# Patient Record
Sex: Female | Born: 1940
Health system: Southern US, Community
[De-identification: ages and names within clinical notes are randomized; demographics above are authoritative.]

## PROBLEM LIST (undated history)

## (undated) DIAGNOSIS — I340 Nonrheumatic mitral (valve) insufficiency: Secondary | ICD-10-CM

## (undated) DIAGNOSIS — M1990 Unspecified osteoarthritis, unspecified site: Secondary | ICD-10-CM

## (undated) DIAGNOSIS — Z950 Presence of cardiac pacemaker: Secondary | ICD-10-CM

## (undated) DIAGNOSIS — H269 Unspecified cataract: Secondary | ICD-10-CM

## (undated) DIAGNOSIS — I447 Left bundle-branch block, unspecified: Secondary | ICD-10-CM

## (undated) DIAGNOSIS — B1920 Unspecified viral hepatitis C without hepatic coma: Secondary | ICD-10-CM

## (undated) DIAGNOSIS — I10 Essential (primary) hypertension: Secondary | ICD-10-CM

## (undated) DIAGNOSIS — F32A Depression, unspecified: Secondary | ICD-10-CM

## (undated) DIAGNOSIS — M549 Dorsalgia, unspecified: Secondary | ICD-10-CM

## (undated) DIAGNOSIS — G8929 Other chronic pain: Secondary | ICD-10-CM

## (undated) DIAGNOSIS — I5040 Unspecified combined systolic (congestive) and diastolic (congestive) heart failure: Secondary | ICD-10-CM

## (undated) DIAGNOSIS — I2510 Atherosclerotic heart disease of native coronary artery without angina pectoris: Secondary | ICD-10-CM

## (undated) DIAGNOSIS — Z955 Presence of coronary angioplasty implant and graft: Secondary | ICD-10-CM

## (undated) DIAGNOSIS — F419 Anxiety disorder, unspecified: Secondary | ICD-10-CM

## (undated) DIAGNOSIS — K219 Gastro-esophageal reflux disease without esophagitis: Secondary | ICD-10-CM

## (undated) DIAGNOSIS — N390 Urinary tract infection, site not specified: Secondary | ICD-10-CM

## (undated) DIAGNOSIS — Z95810 Presence of automatic (implantable) cardiac defibrillator: Secondary | ICD-10-CM

## (undated) DIAGNOSIS — T7840XA Allergy, unspecified, initial encounter: Secondary | ICD-10-CM

## (undated) DIAGNOSIS — G709 Myoneural disorder, unspecified: Secondary | ICD-10-CM

## (undated) DIAGNOSIS — R011 Cardiac murmur, unspecified: Secondary | ICD-10-CM

## (undated) DIAGNOSIS — I5022 Chronic systolic (congestive) heart failure: Secondary | ICD-10-CM

## (undated) DIAGNOSIS — I255 Ischemic cardiomyopathy: Secondary | ICD-10-CM

## (undated) DIAGNOSIS — M810 Age-related osteoporosis without current pathological fracture: Secondary | ICD-10-CM

## (undated) DIAGNOSIS — J449 Chronic obstructive pulmonary disease, unspecified: Secondary | ICD-10-CM

## (undated) DIAGNOSIS — R5382 Chronic fatigue, unspecified: Secondary | ICD-10-CM

## (undated) DIAGNOSIS — I272 Pulmonary hypertension, unspecified: Secondary | ICD-10-CM

## (undated) DIAGNOSIS — I700 Atherosclerosis of aorta: Secondary | ICD-10-CM

## (undated) DIAGNOSIS — I351 Nonrheumatic aortic (valve) insufficiency: Secondary | ICD-10-CM

## (undated) HISTORY — DX: Chronic fatigue, unspecified: R53.82

## (undated) HISTORY — DX: Atherosclerotic heart disease of native coronary artery without angina pectoris: I25.10

## (undated) HISTORY — DX: Allergy, unspecified, initial encounter: T78.40XA

## (undated) HISTORY — DX: Urinary tract infection, site not specified: N39.0

## (undated) HISTORY — DX: Chronic systolic (congestive) heart failure: I50.22

## (undated) HISTORY — DX: Age-related osteoporosis without current pathological fracture: M81.0

## (undated) HISTORY — DX: Unspecified osteoarthritis, unspecified site: M19.90

## (undated) HISTORY — DX: Left bundle-branch block, unspecified: I44.7

## (undated) HISTORY — DX: Other chronic pain: M54.9

## (undated) HISTORY — DX: Ischemic cardiomyopathy: I25.5

## (undated) HISTORY — DX: Cardiac murmur, unspecified: R01.1

## (undated) HISTORY — DX: Gastro-esophageal reflux disease without esophagitis: K21.9

## (undated) HISTORY — DX: Myoneural disorder, unspecified: G70.9

## (undated) HISTORY — DX: Unspecified cataract: H26.9

## (undated) HISTORY — DX: Depression, unspecified: F32.A

## (undated) HISTORY — DX: Other chronic pain: G89.29

## (undated) HISTORY — DX: Anxiety disorder, unspecified: F41.9

## (undated) HISTORY — DX: Unspecified viral hepatitis C without hepatic coma: B19.20

## (undated) HISTORY — DX: Nonrheumatic mitral (valve) insufficiency: I34.0

---

## 1997-07-15 ENCOUNTER — Ambulatory Visit (HOSPITAL_BASED_OUTPATIENT_CLINIC_OR_DEPARTMENT_OTHER): Admission: RE | Admit: 1997-07-15 | Discharge: 1997-07-15 | Payer: Self-pay | Admitting: *Deleted

## 2001-08-03 LAB — HM COLONOSCOPY: HM COLON: NORMAL

## 2001-12-08 ENCOUNTER — Ambulatory Visit (HOSPITAL_COMMUNITY): Admission: RE | Admit: 2001-12-08 | Discharge: 2001-12-08 | Payer: Self-pay | Admitting: Gastroenterology

## 2004-10-11 ENCOUNTER — Emergency Department: Payer: Self-pay | Admitting: Emergency Medicine

## 2012-02-16 ENCOUNTER — Ambulatory Visit: Payer: Self-pay | Admitting: Obstetrics and Gynecology

## 2012-02-18 ENCOUNTER — Ambulatory Visit: Payer: Self-pay

## 2012-05-25 ENCOUNTER — Encounter: Payer: Self-pay | Admitting: Physical Medicine and Rehabilitation

## 2012-06-02 ENCOUNTER — Encounter: Payer: Self-pay | Admitting: Physical Medicine and Rehabilitation

## 2012-07-02 ENCOUNTER — Encounter: Payer: Self-pay | Admitting: Physical Medicine and Rehabilitation

## 2012-09-01 ENCOUNTER — Other Ambulatory Visit (HOSPITAL_COMMUNITY): Payer: Self-pay | Admitting: Physical Medicine and Rehabilitation

## 2012-09-01 DIAGNOSIS — IMO0002 Reserved for concepts with insufficient information to code with codable children: Secondary | ICD-10-CM

## 2012-09-01 DIAGNOSIS — M549 Dorsalgia, unspecified: Secondary | ICD-10-CM

## 2012-09-01 HISTORY — PX: BACK SURGERY: SHX140

## 2012-09-02 ENCOUNTER — Ambulatory Visit (HOSPITAL_COMMUNITY)
Admission: RE | Admit: 2012-09-02 | Discharge: 2012-09-02 | Disposition: A | Discharge status: Home / Self Care | Payer: Medicare PPO | Source: Ambulatory Visit | Attending: Physical Medicine and Rehabilitation | Admitting: Physical Medicine and Rehabilitation

## 2012-09-02 DIAGNOSIS — IMO0002 Reserved for concepts with insufficient information to code with codable children: Secondary | ICD-10-CM

## 2012-09-02 DIAGNOSIS — M549 Dorsalgia, unspecified: Secondary | ICD-10-CM

## 2012-09-07 ENCOUNTER — Other Ambulatory Visit: Payer: Self-pay | Admitting: Radiology

## 2012-09-07 ENCOUNTER — Other Ambulatory Visit (HOSPITAL_COMMUNITY): Payer: Self-pay | Admitting: Interventional Radiology

## 2012-09-07 DIAGNOSIS — M549 Dorsalgia, unspecified: Secondary | ICD-10-CM

## 2012-09-07 DIAGNOSIS — IMO0002 Reserved for concepts with insufficient information to code with codable children: Secondary | ICD-10-CM

## 2012-09-08 ENCOUNTER — Ambulatory Visit (HOSPITAL_COMMUNITY)
Admission: RE | Admit: 2012-09-08 | Discharge: 2012-09-08 | Disposition: A | Discharge status: Home / Self Care | Payer: Medicare PPO | Source: Ambulatory Visit | Attending: Interventional Radiology | Admitting: Interventional Radiology

## 2012-09-08 ENCOUNTER — Encounter (HOSPITAL_COMMUNITY): Payer: Self-pay

## 2012-09-08 ENCOUNTER — Other Ambulatory Visit (HOSPITAL_COMMUNITY): Payer: Self-pay | Admitting: Interventional Radiology

## 2012-09-08 DIAGNOSIS — Z91018 Allergy to other foods: Secondary | ICD-10-CM | POA: Insufficient documentation

## 2012-09-08 DIAGNOSIS — M549 Dorsalgia, unspecified: Secondary | ICD-10-CM

## 2012-09-08 DIAGNOSIS — M810 Age-related osteoporosis without current pathological fracture: Secondary | ICD-10-CM | POA: Insufficient documentation

## 2012-09-08 DIAGNOSIS — IMO0002 Reserved for concepts with insufficient information to code with codable children: Secondary | ICD-10-CM

## 2012-09-08 DIAGNOSIS — M8448XA Pathological fracture, other site, initial encounter for fracture: Secondary | ICD-10-CM | POA: Insufficient documentation

## 2012-09-08 LAB — APTT: aPTT: 27 seconds (ref 24–37)

## 2012-09-08 LAB — CBC
MCH: 31.1 pg (ref 26.0–34.0)
MCHC: 34 g/dL (ref 30.0–36.0)
MCV: 91.4 fL (ref 78.0–100.0)
Platelets: 215 10*3/uL (ref 150–400)
RDW: 13.1 % (ref 11.5–15.5)

## 2012-09-08 LAB — BASIC METABOLIC PANEL
CO2: 29 mEq/L (ref 19–32)
Calcium: 10.1 mg/dL (ref 8.4–10.5)
Creatinine, Ser: 0.59 mg/dL (ref 0.50–1.10)
Glucose, Bld: 107 mg/dL — ABNORMAL HIGH (ref 70–99)

## 2012-09-08 MED ORDER — SODIUM CHLORIDE 0.9 % IV SOLN: Freq: Once | INTRAVENOUS | Status: DC

## 2012-09-08 MED ORDER — HYDROCODONE-ACETAMINOPHEN 5-325 MG PO TABS
1.0000 | ORAL_TABLET | ORAL | Status: DC | PRN
  Administered 2012-09-08: 2 via ORAL

## 2012-09-08 MED ORDER — FENTANYL CITRATE 0.05 MG/ML IJ SOLN
INTRAMUSCULAR | Status: AC
  Filled 2012-09-08: qty 4

## 2012-09-08 MED ORDER — HYDROCODONE-ACETAMINOPHEN 5-325 MG PO TABS
ORAL_TABLET | ORAL | Status: AC
  Filled 2012-09-08: qty 2

## 2012-09-08 MED ORDER — GELATIN ABSORBABLE 12-7 MM EX MISC
CUTANEOUS | Status: AC
  Filled 2012-09-08: qty 1

## 2012-09-08 MED ORDER — TOBRAMYCIN SULFATE 1.2 G IJ SOLR
INTRAMUSCULAR | Status: AC
  Filled 2012-09-08: qty 1.2

## 2012-09-08 MED ORDER — SODIUM CHLORIDE 0.9 % IV SOLN: INTRAVENOUS | Status: AC

## 2012-09-08 MED ORDER — FENTANYL CITRATE 0.05 MG/ML IJ SOLN
INTRAMUSCULAR | Status: DC | PRN
  Administered 2012-09-08 (×4): 25 ug via INTRAVENOUS

## 2012-09-08 MED ORDER — HYDROMORPHONE HCL PF 1 MG/ML IJ SOLN
INTRAMUSCULAR | Status: AC
  Filled 2012-09-08: qty 3

## 2012-09-08 MED ORDER — MIDAZOLAM HCL 2 MG/2ML IJ SOLN
INTRAMUSCULAR | Status: AC
  Filled 2012-09-08: qty 6

## 2012-09-08 MED ORDER — IOHEXOL 300 MG/ML  SOLN
50.0000 mL | Freq: Once | INTRAMUSCULAR | Status: AC | PRN
  Administered 2012-09-08: 3 mL via INTRAVENOUS

## 2012-09-08 MED ORDER — MIDAZOLAM HCL 2 MG/2ML IJ SOLN
INTRAMUSCULAR | Status: DC | PRN
  Administered 2012-09-08 (×4): 1 mg via INTRAVENOUS

## 2012-09-08 MED ORDER — CEFAZOLIN SODIUM-DEXTROSE 2-3 GM-% IV SOLR
2.0000 g | Freq: Once | INTRAVENOUS | Status: AC
  Administered 2012-09-08: 2 g via INTRAVENOUS
  Filled 2012-09-08: qty 50

## 2012-09-08 NOTE — H&P (Signed)
[name] is an 72 y.o. female.   Chief Complaint: back pain since 01/2012. While taking shower- noted new pain- xrays showed T10 and L1 fx Pt has tried conservative management- no relief Consulted with Dr Estanislado Pandy Now scheduled for T1o kyphoplasty and L1 vertebroplasty  HPI: osteoporosis; back pain  History reviewed. No pertinent past medical history.  History reviewed. No pertinent past surgical history.  No family history on file. Social History:  has no tobacco, alcohol, and drug history on file.  Allergies:  Allergies  Allergen Reactions  . Sugar-Protein-Starch Nausea And Vomiting     (Not in a hospital admission)  Results for orders placed during the hospital encounter of 09/08/12 (from the past 48 hour(s))  APTT     Status: None   Collection Time    09/08/12  8:01 AM      Result Value Range   aPTT 27  24 - 37 seconds  CBC     Status: None   Collection Time    09/08/12  8:01 AM      Result Value Range   WBC 6.5  4.0 - 10.5 K/uL   RBC 4.63  3.87 - 5.11 MIL/uL   Hemoglobin 14.4  12.0 - 15.0 g/dL   HCT 42.3  36.0 - 46.0 %   MCV 91.4  78.0 - 100.0 fL   MCH 31.1  26.0 - 34.0 pg   MCHC 34.0  30.0 - 36.0 g/dL   RDW 13.1  11.5 - 15.5 %   Platelets 215  150 - 400 K/uL  PROTIME-INR     Status: None   Collection Time    09/08/12  8:01 AM      Result Value Range   Prothrombin Time 12.5  11.6 - 15.2 seconds   INR 0.95  0.00 - 1.49   No results found.  Review of Systems  Constitutional: Negative for fever.  Respiratory: Positive for shortness of breath.   Cardiovascular: Negative for chest pain.  Gastrointestinal: Negative for vomiting, abdominal pain and diarrhea.  Musculoskeletal: Positive for back pain.  Neurological: Positive for weakness. Negative for headaches.    Blood pressure 174/82, pulse 95, temperature 97.7 F (36.5 C), temperature source Oral, resp. rate 18, height 5\' 3"  (1.6 m), weight 111 lb (50.349 kg), SpO2 100.00%. Physical Exam   Constitutional: She is oriented to person, place, and time.  Cardiovascular: Normal rate, regular rhythm and normal heart sounds.   No murmur heard. Respiratory: Effort normal and breath sounds normal. She has no wheezes.  GI: Soft. Bowel sounds are normal. There is no tenderness.  Musculoskeletal: Normal range of motion.  Back pain  Neurological: She is alert and oriented to person, place, and time.  Skin: Skin is warm.  Psychiatric: She has a normal mood and affect. Her behavior is normal. Judgment and thought content normal.     Assessment/Plan Back pain T10; L1 fx Scheduled for KP/VP Pt aware of procedure benefits and risks and agreeable to proceed Consent signed and in chart  Alessia Gonsalez A 09/08/2012, 9:00 AM

## 2012-09-08 NOTE — Procedures (Signed)
S/P T10 VP and L1 VP

## 2012-09-09 ENCOUNTER — Telehealth (HOSPITAL_COMMUNITY): Payer: Self-pay | Admitting: *Deleted

## 2012-09-09 NOTE — Telephone Encounter (Signed)
Post procedure follow up call.  Spoke with pt.  Says she is doing well, no problems.  Denies questions or concerns at this time.  Says care was super, everyone did a great job and she was very impressed

## 2012-09-10 ENCOUNTER — Other Ambulatory Visit (HOSPITAL_COMMUNITY): Payer: Self-pay | Admitting: Interventional Radiology

## 2012-09-10 DIAGNOSIS — IMO0002 Reserved for concepts with insufficient information to code with codable children: Secondary | ICD-10-CM

## 2012-09-10 DIAGNOSIS — M549 Dorsalgia, unspecified: Secondary | ICD-10-CM

## 2012-09-22 ENCOUNTER — Ambulatory Visit (HOSPITAL_COMMUNITY)
Admission: RE | Admit: 2012-09-22 | Discharge: 2012-09-22 | Disposition: A | Discharge status: Home / Self Care | Payer: Medicare PPO | Source: Ambulatory Visit | Attending: Interventional Radiology | Admitting: Interventional Radiology

## 2012-09-22 DIAGNOSIS — M549 Dorsalgia, unspecified: Secondary | ICD-10-CM

## 2012-09-22 DIAGNOSIS — IMO0002 Reserved for concepts with insufficient information to code with codable children: Secondary | ICD-10-CM

## 2012-11-19 ENCOUNTER — Encounter: Payer: Self-pay | Admitting: Adult Health

## 2012-11-19 ENCOUNTER — Ambulatory Visit (INDEPENDENT_AMBULATORY_CARE_PROVIDER_SITE_OTHER): Payer: Medicare PPO | Admitting: Adult Health

## 2012-11-19 VITALS — BP 164/76 | HR 85 | Temp 97.9°F | Resp 12 | Ht 61.5 in | Wt 109.5 lb

## 2012-11-19 DIAGNOSIS — Z0000 Encounter for general adult medical examination without abnormal findings: Secondary | ICD-10-CM

## 2012-11-19 DIAGNOSIS — M810 Age-related osteoporosis without current pathological fracture: Secondary | ICD-10-CM

## 2012-11-19 DIAGNOSIS — I10 Essential (primary) hypertension: Secondary | ICD-10-CM | POA: Insufficient documentation

## 2012-11-19 NOTE — Assessment & Plan Note (Signed)
Normal exam except with her musculoskeletal system. Difficulty moving from sitting to standing position. Uses cane for stability. Significant back pain 2/2 arthritis and osteoporosis which she has not tolerated any bisphosphonate. She is apprehensive to trying the yearly IV infusion. Concerns about osteoporosis progressing and decreasing quality of life. Recently had labs drawn with one of the specialist but she is uncertain as to which were done. I will request records.

## 2012-11-19 NOTE — Assessment & Plan Note (Signed)
Patient reports that her condition is worsening. She has not tolerated fosamax or boniva. Does not want to consider IV yearly infusion. Ambulating with cane. Encourage her to speak with her orthopedic. She is going to schedule appt with them. I will request medical records. Refer if needed.

## 2012-11-19 NOTE — Assessment & Plan Note (Signed)
Currently on lisinopril. She reports that her blood pressure does well until she has to come to the provider's office. She was nervous about coming in. Feels this is why b/p elevated during visit. She checks this routinely in the evening and feels well controlled. We will continue to follow.

## 2012-11-19 NOTE — Progress Notes (Signed)
Subjective:    Patient ID: [name], female    DOB: 1940-06-13, 72 y.o.   MRN: [id number]  HPI  Patient is a pleasant 72 y/o female who presents to clinic to establish care.  She has ongoing problems related to her back. She also has a hx of osteoporosis. She reports she did not tolerate fosamax or boniva. She is concerned about the side effects of these medications. She reports that she looks up information on the internet related to these medications and has not found very reassuring information. She is walking with a cane. Reports that last bone scan was last year. She believes that her osteoporosis is worse but she is unable to give details. She has been approached about a yearly IV infusion but she is concerned about side effects.  I will request medical records.     Past Medical History  Diagnosis Date  . UTI (lower urinary tract infection)   . Arthritis   . Osteoporosis      Past Surgical History  Procedure Laterality Date  . Back surgery  07/14    T10, L1     Family History  Problem Relation Age of Onset  . Arthritis Mother   . Heart disease Father   . Diabetes Father      History   Social History  . Marital Status: Married    Spouse Name: Emma-Jane Velazques    Number of Children: 0  . Years of Education: 14   Occupational History  . 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 Smoking status: Former Smoker -- 1.00 packs/day for 10 years  . Smokeless tobacco: Never Used  . Alcohol Use: No  . Drug Use: No  . Sexual Activity: Yes    Birth Control/ Protection: Post-menopausal   Other Topics Concern  . Not on file   Social History Narrative   Pt is 72 yo female, married to husband of 21 years.  Pt has no children.  Pt lives with husband and teacup poodle, [name]. Pt states she hurt her back last year and has not been able to participate in much physical activity since. Pt enjoys traveling with her husband in motor home, doing crossword  puzzles with husband, and playing cards with friends.  Pt was raised in [place] and has lived in [place] entire adult life.  Pt worked in [employer] in [place] until [place] when they closed.        Review of Systems  Constitutional: Negative.   HENT: Negative.   Eyes: Negative.   Respiratory: Negative.   Cardiovascular: Negative.   Gastrointestinal: Positive for constipation.       Senna to help move her bowels  Endocrine: Negative.   Genitourinary: Negative for dysuria, frequency, hematuria and pelvic pain.       AZO for bladder control - uses for leakage  Musculoskeletal: Positive for back pain and gait problem.  Skin: Negative.   Allergic/Immunologic: Negative.   Neurological: Negative.   Hematological: Negative.   Psychiatric/Behavioral: Negative.        Objective:   Physical Exam  Constitutional: She is oriented to person, place, and time.  72 y/o pleasant female in NAD  HENT:  Head: Normocephalic and atraumatic.  Right Ear: External ear normal.  Left Ear: External ear normal.  Mouth/Throat: Oropharynx is clear and moist.  Eyes: Conjunctivae and EOM are normal. Pupils are equal, round, and reactive to light.  Neck:  Normal range of motion. Neck supple.  Cardiovascular: Normal rate, regular rhythm, normal heart sounds and intact distal pulses.  Exam reveals no gallop and no friction rub.   No murmur heard. Pulmonary/Chest: Effort normal and breath sounds normal. No respiratory distress. She has no wheezes. She has no rales.  Abdominal: Soft. Bowel sounds are normal. She exhibits no distension and no mass. There is no tenderness. There is no rebound and no guarding.  Musculoskeletal: She exhibits tenderness. She exhibits no edema.  Walks with cane. Decreased ability to get up and go.  Lymphadenopathy:    She has no cervical adenopathy.  Neurological: She is alert and oriented to person, place, and time.  Skin: Skin is warm and dry.  Psychiatric: She has a  normal mood and affect. Her behavior is normal. Judgment and thought content normal.    BP 164/76  Pulse 85  Temp(Src) 97.9 F (36.6 C) (Oral)  Resp 12  Ht 5' 1.5" (1.562 m)  Wt 109 lb 8 oz (49.669 kg)  BMI 20.36 kg/m2  SpO2 97%     Assessment & Plan:

## 2013-03-09 ENCOUNTER — Telehealth: Payer: Self-pay | Admitting: Adult Health

## 2013-03-09 NOTE — Telephone Encounter (Signed)
The patient wants to know if she needs a booster with her pneumonia vaccine.

## 2013-03-10 NOTE — Telephone Encounter (Signed)
Discussed with patient, received first Pneumovax after age 72, does not need second Pneumovax at this time. Also, discussed Medicare not covering Prevnar at this point for those who received Pneumovax after age 55. Can discuss with Raquel at her next visit.

## 2013-04-03 ENCOUNTER — Emergency Department: Payer: Self-pay | Admitting: Emergency Medicine

## 2013-04-03 LAB — URINALYSIS, COMPLETE
BLOOD: NEGATIVE
Bacteria: NONE SEEN
Bilirubin,UR: NEGATIVE
Glucose,UR: NEGATIVE mg/dL (ref 0–75)
Ketone: NEGATIVE
LEUKOCYTE ESTERASE: NEGATIVE
Nitrite: NEGATIVE
PH: 7 (ref 4.5–8.0)
Protein: NEGATIVE
RBC,UR: 2 /HPF (ref 0–5)
SPECIFIC GRAVITY: 1.018 (ref 1.003–1.030)
Squamous Epithelial: <1
WBC UR: 1 /HPF (ref 0–5)

## 2013-04-03 LAB — CBC
HCT: 45.3 % (ref 35.0–47.0)
HGB: 14.6 g/dL (ref 12.0–16.0)
MCH: 30.5 pg (ref 26.0–34.0)
MCHC: 32.3 g/dL (ref 32.0–36.0)
MCV: 94 fL (ref 80–100)
PLATELETS: 239 10*3/uL (ref 150–440)
RBC: 4.8 10*6/uL (ref 3.80–5.20)
RDW: 13.7 % (ref 11.5–14.5)
WBC: 21.3 10*3/uL — AB (ref 3.6–11.0)

## 2013-04-03 LAB — COMPREHENSIVE METABOLIC PANEL
AST: 32 U/L (ref 15–37)
Albumin: 4.1 g/dL (ref 3.4–5.0)
Alkaline Phosphatase: 79 U/L
Anion Gap: 5 — ABNORMAL LOW (ref 7–16)
BILIRUBIN TOTAL: 0.4 mg/dL (ref 0.2–1.0)
BUN: 26 mg/dL — AB (ref 7–18)
CO2: 28 mmol/L (ref 21–32)
CREATININE: 0.69 mg/dL (ref 0.60–1.30)
Calcium, Total: 9.2 mg/dL (ref 8.5–10.1)
Chloride: 106 mmol/L (ref 98–107)
GLUCOSE: 141 mg/dL — AB (ref 65–99)
Osmolality: 285 (ref 275–301)
Potassium: 4.7 mmol/L (ref 3.5–5.1)
SGPT (ALT): 26 U/L (ref 12–78)
Sodium: 139 mmol/L (ref 136–145)
TOTAL PROTEIN: 8 g/dL (ref 6.4–8.2)

## 2013-04-03 LAB — TROPONIN I: Troponin-I: <0.02 ng/mL

## 2013-04-03 LAB — LIPASE, BLOOD: LIPASE: 150 U/L (ref 73–393)

## 2013-11-17 ENCOUNTER — Ambulatory Visit: Payer: Medicare PPO | Admitting: Adult Health

## 2013-11-17 ENCOUNTER — Ambulatory Visit (INDEPENDENT_AMBULATORY_CARE_PROVIDER_SITE_OTHER): Payer: Commercial Managed Care - HMO | Admitting: Adult Health

## 2013-11-17 ENCOUNTER — Telehealth: Payer: Self-pay | Admitting: *Deleted

## 2013-11-17 ENCOUNTER — Encounter: Payer: Self-pay | Admitting: Adult Health

## 2013-11-17 VITALS — BP 158/79 | HR 81 | Temp 98.4°F | Resp 14 | Wt 116.0 lb

## 2013-11-17 DIAGNOSIS — Z8619 Personal history of other infectious and parasitic diseases: Secondary | ICD-10-CM

## 2013-11-17 DIAGNOSIS — Z13820 Encounter for screening for osteoporosis: Secondary | ICD-10-CM | POA: Insufficient documentation

## 2013-11-17 DIAGNOSIS — I10 Essential (primary) hypertension: Secondary | ICD-10-CM

## 2013-11-17 MED ORDER — LISINOPRIL 10 MG PO TABS: 10.0000 mg | ORAL_TABLET | Freq: Every day | ORAL | Status: DC

## 2013-11-17 NOTE — Telephone Encounter (Signed)
Order form has been faxed over [phone] and left up front for pt to pick up

## 2013-11-17 NOTE — Progress Notes (Signed)
Pre visit review using our clinic review tool, if applicable. No additional management support is needed unless otherwise documented below in the visit note. 

## 2013-11-17 NOTE — Telephone Encounter (Signed)
That would be ok. Are you faxing the order to them?

## 2013-11-17 NOTE — Patient Instructions (Signed)
  Please have labs drawn prior to leaving the office.  See Hoyle Sauer to schedule your bone density. If indicated, we will consider trying boniva once a month for osteoporosis.   Refill for your lisinopril was sent to your mail order pharmacy. Please make sure to take 1 tablet daily. The 1/2 a tablet is not maintaining your blood pressure. Monitor your blood pressure at home and return in 1 month for a follow up. Bring your blood pressure readings with you.

## 2013-11-17 NOTE — Telephone Encounter (Signed)
Pt was a hard stick, didn't want to go to [facility], wants it to be done at the [facility] on [address]- to have the order form faxed over

## 2013-11-17 NOTE — Progress Notes (Signed)
Patient ID: [name], female   DOB: 1940/05/15, 73 y.o.   MRN: [id number]   Subjective:    Patient ID: [name], female    DOB: October 08, 1940, 73 y.o.   MRN: [id number]  HPI Pt is a pleasant 73 y/o female who presents to clinic with the following concerns:  1. Hx of osteoporosis She has been on fosamax and could not tolerate secondary to side effects. She was seen for infusion of prolia but she was concerned about side effects lasting longer since this was an infusion so she opted to not have it done. She has been taking an OTC herb for bone health. She is exercising regularly.    2. hypertension Slightly elevated systolic. She reports that her blood pressure at home is around the 0000000 systolic. She has been cutting her lisinopril in half. Her current dose is 10 mg so only taking 5 mg daily   3. History of hepatitis C Reports that in the 1980s she had hepatitis C that she contracted through sex. No history of IV drug use or needle sticks of any kind. She reports that she was followed closely and that "all was fine". She had not mentioned this previously but thought she needed to have a follow up on this since she has been seeing TV commercials about Hep C. Denies any fatigue or malaise. She feels well overall.    Past Medical History  Diagnosis Date  . UTI (lower urinary tract infection)   . Arthritis   . Osteoporosis   . Hepatitis C     Current Outpatient Prescriptions on File Prior to Visit  Medication Sig Dispense Refill  . cholecalciferol (VITAMIN D) 1000 UNITS tablet Take 1,000 Units by mouth daily.      . Magnesium 250 MG TABS Take 1 tablet by mouth daily.      . Multiple Vitamin (MULTIVITAMIN) tablet Take 1 tablet by mouth daily.      . NON FORMULARY Take 1 tablet by mouth 3 (three) times daily. Osteosine, herbal supplement      . Omega-3 Fatty Acids (FISH OIL) 1200 MG CAPS Take 1 capsule by mouth daily.       No current facility-administered medications on file prior  to visit.     Review of Systems  Constitutional: Negative.   Respiratory: Negative.   Cardiovascular: Negative.   Gastrointestinal: Negative.   Genitourinary: Negative.   Musculoskeletal: Negative.   Skin: Negative.   Neurological: Negative.   Hematological: Negative.   Psychiatric/Behavioral: Negative.   All other systems reviewed and are negative.      Objective:  BP 158/79  Pulse 81  Temp(Src) 98.4 F (36.9 C) (Oral)  Resp 14  Wt 116 lb (52.617 kg)  SpO2 100%   Physical Exam  Constitutional: She is oriented to person, place, and time. No distress.  HENT:  Head: Normocephalic and atraumatic.  Eyes: Conjunctivae and EOM are normal.  Neck: Normal range of motion. Neck supple.  Cardiovascular: Normal rate, regular rhythm, normal heart sounds and intact distal pulses.  Exam reveals no gallop and no friction rub.   No murmur heard. Pulmonary/Chest: Effort normal and breath sounds normal. No respiratory distress. She has no wheezes. She has no rales.  Musculoskeletal: Normal range of motion.  Neurological: She is alert and oriented to person, place, and time. She has normal reflexes. Coordination normal.  Skin: Skin is warm and dry.  Psychiatric: She has a normal mood and affect. Her behavior is normal.  Judgment and thought content normal.       Assessment & Plan:   1. Screening for osteoporosis I will order bone density to evaluate her status. We discussed trying boniva if indicated. She is willing to give this a try since it is a once a month medication.  - DG Bone Density; Future  2. Essential hypertension Not optimally controlled. Discussed this in detail. She will begin taking her full dose of lisinopril (10mg ). Check labs. Follow up in 1 month - Basic metabolic panel - CBC with Differential  3. History of hepatitis C Check labs. Refer if indicated. - Hepatitis C antibody

## 2013-11-17 NOTE — Telephone Encounter (Signed)
yes

## 2013-11-22 LAB — BASIC METABOLIC PANEL
BUN: 28 mg/dL — AB (ref 4–21)
BUN: 21 mg/dL (ref 4–21)
Creatinine: 0.8 mg/dL (ref 0.5–1.1)
Creatinine: 0.8 mg/dL (ref 0.5–1.1)
Glucose: 105 mg/dL
Glucose: 105 mg/dL
Potassium: 5.5 mmol/L — AB (ref 3.4–5.3)
Potassium: 5.5 mmol/L — AB (ref 3.4–5.3)
SODIUM: 145 mmol/L (ref 137–147)
Sodium: 145 mmol/L (ref 137–147)

## 2013-11-22 LAB — CBC AND DIFFERENTIAL
HCT: 44 % (ref 36–46)
Hemoglobin: 14.9 g/dL (ref 12.0–16.0)
Neutrophils Absolute: 4 /uL
PLATELETS: 250 10*3/uL (ref 150–399)
WBC: 5.5 10*3/mL

## 2013-11-30 ENCOUNTER — Telehealth: Payer: Self-pay | Admitting: Internal Medicine

## 2013-11-30 NOTE — Telephone Encounter (Signed)
Patient labs abstracted on 11/22/13 please advise patient was Charolette Forward NP .

## 2013-11-30 NOTE — Telephone Encounter (Signed)
Called [facility] Hep C results are being faxed over now

## 2013-11-30 NOTE — Telephone Encounter (Signed)
The patient is calling for her lab results.

## 2013-11-30 NOTE — Telephone Encounter (Signed)
The hepatits C was ordered but not resulted,  please forward this  to carolyn to find out the status.  And other labs normal except that  Her potassium was slightly elevated and will need rechecking this week to make sure it was a transient elevation.  May need to repeat the Hep C as well if carolyn can't get it finalized.    BMET and Hep C potentially need repeating

## 2013-12-01 NOTE — Telephone Encounter (Signed)
Pt called requesting lab results. Given all results except Hepatitis C, was advised that results have been sent to the office and we would call once they had been reviewed. Pt declines to have her blood rechecked this week due to "such difficulty getting it drawn the first time". Advised of importance of having it rechecked, pt declined again.

## 2013-12-02 ENCOUNTER — Telehealth: Payer: Self-pay | Admitting: Internal Medicine

## 2013-12-02 DIAGNOSIS — I10 Essential (primary) hypertension: Secondary | ICD-10-CM

## 2013-12-02 DIAGNOSIS — E875 Hyperkalemia: Secondary | ICD-10-CM

## 2013-12-02 NOTE — Telephone Encounter (Signed)
Pt notified and verbalized understanding. Lab appt scheduled for tomorrow.

## 2013-12-02 NOTE — Telephone Encounter (Signed)
Her Potassium is elevated on lisinopril  Slightly .  She  will need to repeat tomorrow (labs were 9/21)

## 2013-12-02 NOTE — Assessment & Plan Note (Addendum)
Her Potassium is elevated on lisinopril at 5.5 will need to repeat tomorrow (labs were 9/21)

## 2013-12-03 ENCOUNTER — Other Ambulatory Visit: Payer: Medicare PPO

## 2013-12-07 ENCOUNTER — Encounter: Payer: Self-pay | Admitting: Adult Health

## 2013-12-17 ENCOUNTER — Encounter: Payer: Self-pay | Admitting: Adult Health

## 2013-12-17 NOTE — Telephone Encounter (Signed)
[name] came by asking for her blood work results. The paperwork we had for her was a from [place] needed signed and the blood work referral. I asked [name] about this and she said she didn't see anything in the patient's file regarding recent blood work being drawn. The patient said she went to [facility] on 10/1 and had her blood drawn. Please call the patient. Pt ph# [phone] Thank you.

## 2013-12-17 NOTE — Telephone Encounter (Signed)
Called [facility] for results. States most recent labs were 11/22/13, which are results we already have. She was supposed to have repeat BMP done 12/02/13. Advised pt she was supposed to have potassium rechecked, she is refusing to have rechecked. She would like a copy of her 11/22/13 mailed to her. Mailed results.

## 2014-01-02 ENCOUNTER — Emergency Department: Payer: Self-pay | Admitting: Emergency Medicine

## 2014-01-02 LAB — URINALYSIS, COMPLETE
Bacteria: NONE SEEN
Bilirubin,UR: NEGATIVE
Glucose,UR: NEGATIVE mg/dL (ref 0–75)
NITRITE: NEGATIVE
PROTEIN: NEGATIVE
Ph: 5 (ref 4.5–8.0)
RBC,UR: 5 /HPF (ref 0–5)
Specific Gravity: 1.019 (ref 1.003–1.030)
Squamous Epithelial: <1
WBC UR: 21 /HPF (ref 0–5)

## 2014-01-10 ENCOUNTER — Telehealth: Payer: Self-pay | Admitting: Nurse Practitioner

## 2014-01-10 ENCOUNTER — Ambulatory Visit: Payer: Self-pay | Admitting: Adult Health

## 2014-01-10 DIAGNOSIS — Z0100 Encounter for examination of eyes and vision without abnormal findings: Secondary | ICD-10-CM

## 2014-01-10 LAB — HM DEXA SCAN

## 2014-01-10 NOTE — Telephone Encounter (Signed)
Spoke with pt in reference to Bone Density Results, scheduled NP appoint with Morey Hummingbird.  Pt only needs referral to [facility].  Please advise

## 2014-01-10 NOTE — Telephone Encounter (Signed)
[name] called saying she needs a referral to [facility] for her appt on 02/09/14 at 1:30pm. She also said she's having a Bone Density performed today and she's wondering who will call her with the results. She'd like to get in with Dr. Flonnie Overman as soon as her scheduled permits as well. Please call the patient. Pt ph# [phone] Thank you.

## 2014-01-10 NOTE — Telephone Encounter (Signed)
Referral is in process as requested 

## 2014-01-11 ENCOUNTER — Encounter: Payer: Self-pay | Admitting: *Deleted

## 2014-01-16 ENCOUNTER — Telehealth: Payer: Self-pay | Admitting: Internal Medicine

## 2014-01-16 DIAGNOSIS — M810 Age-related osteoporosis without current pathological fracture: Secondary | ICD-10-CM

## 2014-01-17 ENCOUNTER — Ambulatory Visit: Payer: Medicare PPO | Admitting: Nurse Practitioner

## 2014-02-03 ENCOUNTER — Encounter: Payer: Self-pay | Admitting: Adult Health

## 2014-02-04 ENCOUNTER — Ambulatory Visit (INDEPENDENT_AMBULATORY_CARE_PROVIDER_SITE_OTHER): Payer: Commercial Managed Care - HMO | Admitting: Nurse Practitioner

## 2014-02-04 ENCOUNTER — Encounter: Payer: Self-pay | Admitting: Nurse Practitioner

## 2014-02-04 VITALS — BP 180/80 | HR 99 | Resp 14 | Ht 61.5 in | Wt 117.0 lb

## 2014-02-04 DIAGNOSIS — R112 Nausea with vomiting, unspecified: Secondary | ICD-10-CM

## 2014-02-04 DIAGNOSIS — M810 Age-related osteoporosis without current pathological fracture: Secondary | ICD-10-CM

## 2014-02-04 DIAGNOSIS — E875 Hyperkalemia: Secondary | ICD-10-CM

## 2014-02-04 DIAGNOSIS — I10 Essential (primary) hypertension: Secondary | ICD-10-CM

## 2014-02-04 MED ORDER — ONDANSETRON 4 MG PO TBDP: 4.0000 mg | ORAL_TABLET | Freq: Three times a day (TID) | ORAL | Status: DC | PRN

## 2014-02-04 NOTE — Assessment & Plan Note (Signed)
Uncontrolled- She occasionally comes across foods that she eats that cause severe nausea and vomiting. ER gave her trial of zofran ODT and she is currently out. Rx Zofran ODT 4 mg discussed that this may be expensive. Pt states she will see.

## 2014-02-04 NOTE — Assessment & Plan Note (Addendum)
Pt has refused further therapy by bisphosphonates. Her last report was uploaded yesterday. 01/10/14 date of exam shows femur at -4.7 same as previous. She is not worsening and is trying supplementation. Discussed the possibility of seeing ortho specialist. She is not very receptive to further ideas.

## 2014-02-04 NOTE — Assessment & Plan Note (Signed)
White coat HTN suspected. Pt follows BP at home regularly and showed me her log with normal BP. Still taking Lisinopril. Will follow

## 2014-02-04 NOTE — Progress Notes (Signed)
Pre visit review using our clinic review tool, if applicable. No additional management support is needed unless otherwise documented below in the visit note. 

## 2014-02-04 NOTE — Progress Notes (Signed)
Subjective:    Patient ID: [name], female    DOB: [birth year], 73 y.o.   MRN: [id number]  HPI   [name] is a 73 yo female here for a follow up to discuss lab results.   1) Last BMET and CBC w/ diff were on 11/22/2013. These were reviewed with the patient. He appeared to be somewhat dehydrated according herself and labs. [name] did state that [name] does not have adequate water intake. Her K+ of 5.5 has happened before in the past. [name] was unsure of cause. Denies use of salt substitute, but has increased spinach intake.   2) [name] has stopped taking pill form calcium (due to constipation [name] reports) and started with a liquid calcium with magnesium this week. [name] is also taking something called Osteosine that has calcium and magnesium as well. No potassium in this medication.  [name] is taking this because of her osteoporosis and her sensitivity to bisphosphonates.   3) Osteoporosis with multiple vertebroplasties by Dr. Patrecia Pour Tried:  Fosamax- "drew [name] over" (in crouching position).   Actonel- Same problem   Prolia- refused infusion due to not wanting symptoms Exercise- none, because of back pain  Spinach, cheese, milk  Next eye exam: next week.   4) Vomiting when eating something "wrong".    Vomits 2 months ago sent to ER. Zofran ODT  Denies blood in vomit, reports loose stools.    5) Blood pressure: Taken at home 112/89, 111/89, 137/87  All this morning. [name] has white coat HTN [name] states.   Review of Systems  Constitutional: Negative for fever, chills, diaphoresis, appetite change, fatigue and unexpected weight change.  Eyes: Negative for visual disturbance.  Respiratory: Negative for cough, shortness of breath and wheezing.   Cardiovascular: Negative for chest pain, palpitations and leg swelling.  Musculoskeletal: Positive for back pain.  Skin: Negative for rash.  Neurological: Negative for syncope, weakness, light-headedness, numbness and headaches.  Hematological: Does not  bruise/bleed easily.   Past Medical History  Diagnosis Date  . UTI (lower urinary tract infection)   . Arthritis   . Osteoporosis   . Hepatitis C     History   Social History  . Marital Status: Married    Spouse Name: [name]    Number of Children: 0  . 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 Smoking status: Former Smoker -- 1.00 packs/day for 10 years  . Smokeless tobacco: Never Used  . Alcohol Use: No  . Drug Use: No  . Sexual Activity: Yes    Birth Control/ Protection: Post-menopausal   Other Topics Concern  . Not on file   Social History Narrative   [name] is 73 yo female, married to husband of 21 years.  [name] has no children.  [name] lives with husband and teacup poodle, [name]. [name] states [name] hurt her back last year and has not been able to participate in much physical activity since. [name] enjoys traveling with her husband in motor home, doing crossword puzzles with husband, and playing cards with friends.  [name] was raised in [place] and has lived in [place] entire adult life.  [name] worked in [employer] in [place] until [name] when they closed.      Past Surgical History  Procedure Laterality Date  . Back surgery  07/14    T10, L1    Family History  Problem Relation Age of Onset  . Arthritis Mother   . Heart disease Father   . Diabetes Father     Allergies  Allergen Reactions  . Sugar-Protein-Starch Nausea And Vomiting    Only to sugar    Current Outpatient Prescriptions on File Prior to Visit  Medication Sig Dispense Refill  . cholecalciferol (VITAMIN D) 1000 UNITS tablet Take 1,000 Units by mouth daily.    Marland Kitchen lisinopril (PRINIVIL,ZESTRIL) 10 MG tablet Take 1 tablet (10 mg total) by mouth daily. 90 tablet 3  . Magnesium 250 MG TABS Take 1 tablet by mouth daily.    . Multiple Vitamin (MULTIVITAMIN) tablet Take 1 tablet by mouth daily.    . NON FORMULARY Take 1 tablet by mouth 3  (three) times daily. Osteosine, herbal supplement     No current facility-administered medications on file prior to visit.       Objective:   Physical Exam  Constitutional: [name] is oriented to person, place, and time. [name] appears well-developed. No distress.  Very frail female  HENT:  Head: Normocephalic and atraumatic.  Cardiovascular: Normal rate, regular rhythm and normal heart sounds.   Pulmonary/Chest: Effort normal and breath sounds normal.  Neurological: [name] is alert and oriented to person, place, and time. [name] displays normal reflexes. No cranial nerve deficit. Coordination normal.  Gait is stooped because of back pain [name] reports.   Skin: Skin is warm and dry. No rash noted. [name] is not diaphoretic.  Psychiatric: [name] has a normal mood and affect. Her behavior is normal. Judgment and thought content normal.     BP 180/80 mmHg  Pulse 99  Resp 14  Ht 5' 1.5" (1.562 m)  Wt 117 lb (53.071 kg)  BMI 21.75 kg/m2  SpO2 97%      Assessment & Plan:

## 2014-02-04 NOTE — Assessment & Plan Note (Signed)
Patient is not symptomatic. We will check labs today and switch off of Lisinopril if necessary. Will ID other causes as well.

## 2014-02-04 NOTE — Patient Instructions (Signed)
We will see what your potassium level comes back as before changing medications. Call us if you have any symptoms of weakness, heart palpitations, chest pain these can be symptoms of high potassium. Follow up in 2 weeks.

## 2014-02-06 LAB — BASIC METABOLIC PANEL
BUN: 23 mg/dL (ref 6–23)
CHLORIDE: 103 meq/L (ref 96–112)
CO2: 28 mEq/L (ref 19–32)
CREATININE: 0.8 mg/dL (ref 0.4–1.2)
Calcium: 9.8 mg/dL (ref 8.4–10.5)
GFR: 72.76 mL/min (ref >60.00)
Glucose, Bld: 90 mg/dL (ref 70–99)
POTASSIUM: 4.3 meq/L (ref 3.5–5.1)
Sodium: 143 mEq/L (ref 135–145)

## 2014-02-07 ENCOUNTER — Telehealth: Payer: Self-pay | Admitting: Nurse Practitioner

## 2014-02-07 NOTE — Telephone Encounter (Signed)
Spoke with pt advised of providers message.  Pt verbalized understanding.

## 2014-02-07 NOTE — Telephone Encounter (Signed)
emmi emailed °

## 2014-02-07 NOTE — Telephone Encounter (Signed)
Pt came into the clinic complaining of low back pain.  Pt was seen on Friday 12.4.15. Pt states she realized her Potassium may be elevated because she has been taking 1800 mg-2400 mg of Ibuprofen daily for back pain.  Pt further states she stopped taking Ibuprofen on Saturday after realizing that may be the cause.  Pt is requesting something for pain.  Please advise

## 2014-02-07 NOTE — Telephone Encounter (Signed)
Please call Mrs. Spagnoli and let her know that she can continue taking the ibuprofen at her usual doses and that her potassium is normal range. Thanks! Morey Hummingbird

## 2014-02-07 NOTE — Telephone Encounter (Signed)
Patient would also like you to know that she is allergic to Aleve. She said she cannot function without getting something soon.

## 2014-02-08 NOTE — Telephone Encounter (Signed)
Error/gd °

## 2014-02-13 ENCOUNTER — Observation Stay: Payer: Self-pay | Admitting: Internal Medicine

## 2014-02-13 LAB — CBC
HCT: 44.2 % (ref 35.0–47.0)
HGB: 14.4 g/dL (ref 12.0–16.0)
MCH: 30.7 pg (ref 26.0–34.0)
MCHC: 32.6 g/dL (ref 32.0–36.0)
MCV: 94 fL (ref 80–100)
PLATELETS: 269 10*3/uL (ref 150–440)
RBC: 4.69 10*6/uL (ref 3.80–5.20)
RDW: 13 % (ref 11.5–14.5)
WBC: 10.6 10*3/uL (ref 3.6–11.0)

## 2014-02-13 LAB — BASIC METABOLIC PANEL
Anion Gap: 4 — ABNORMAL LOW (ref 7–16)
BUN: 20 mg/dL — AB (ref 7–18)
CALCIUM: 9.4 mg/dL (ref 8.5–10.1)
CHLORIDE: 107 mmol/L (ref 98–107)
CREATININE: 0.83 mg/dL (ref 0.60–1.30)
Co2: 30 mmol/L (ref 21–32)
EGFR (African American): >60
Glucose: 96 mg/dL (ref 65–99)
Osmolality: 284 (ref 275–301)
Potassium: 4.3 mmol/L (ref 3.5–5.1)
SODIUM: 141 mmol/L (ref 136–145)

## 2014-02-14 ENCOUNTER — Telehealth: Payer: Self-pay | Admitting: Nurse Practitioner

## 2014-02-14 DIAGNOSIS — IMO0002 Reserved for concepts with insufficient information to code with codable children: Secondary | ICD-10-CM

## 2014-02-14 LAB — CBC WITH DIFFERENTIAL/PLATELET
BASOS PCT: 0.3 %
Basophil #: 0 10*3/uL (ref 0.0–0.1)
EOS PCT: 0 %
Eosinophil #: 0 10*3/uL (ref 0.0–0.7)
HCT: 39.1 % (ref 35.0–47.0)
HGB: 12.9 g/dL (ref 12.0–16.0)
Lymphocyte #: 0.6 10*3/uL — ABNORMAL LOW (ref 1.0–3.6)
Lymphocyte %: 9.4 %
MCH: 30.6 pg (ref 26.0–34.0)
MCHC: 33 g/dL (ref 32.0–36.0)
MCV: 93 fL (ref 80–100)
Monocyte #: 0.3 x10 3/mm (ref 0.2–0.9)
Monocyte %: 4.5 %
NEUTROS ABS: 5.6 10*3/uL (ref 1.4–6.5)
NEUTROS PCT: 85.8 %
Platelet: 212 10*3/uL (ref 150–440)
RBC: 4.21 10*6/uL (ref 3.80–5.20)
RDW: 12.9 % (ref 11.5–14.5)
WBC: 6.5 10*3/uL (ref 3.6–11.0)

## 2014-02-14 LAB — BASIC METABOLIC PANEL
Anion Gap: 5 — ABNORMAL LOW (ref 7–16)
BUN: 16 mg/dL (ref 4–21)
BUN: 16 mg/dL (ref 7–18)
CALCIUM: 9.2 mg/dL (ref 8.5–10.1)
CREATININE: 0.9 mg/dL (ref 0.5–1.1)
CREATININE: 0.86 mg/dL (ref 0.60–1.30)
Chloride: 106 mmol/L (ref 98–107)
Co2: 28 mmol/L (ref 21–32)
EGFR (African American): >60
GLUCOSE: 109 mg/dL
Glucose: 109 mg/dL — ABNORMAL HIGH (ref 65–99)
Osmolality: 279 (ref 275–301)
Potassium: 4 mmol/L (ref 3.4–5.3)
Potassium: 4 mmol/L (ref 3.5–5.1)
SODIUM: 139 mmol/L (ref 137–147)
Sodium: 139 mmol/L (ref 136–145)

## 2014-02-14 LAB — CBC AND DIFFERENTIAL
HCT: 44 % (ref 36–46)
Hemoglobin: 14.4 g/dL (ref 12.0–16.0)
WBC: 10.6 10*3/mL

## 2014-02-14 NOTE — Telephone Encounter (Signed)
[name] from [facility] called saying [name] needs a referral to see Dr. Wandra Feinstein, an Orthopedic Specialist. She's in the hospital now and being discharged today. The diagnosis is: compression fracture. If you have any questions, feel free to contact [place]: [phone] or the pt directly.  Thank you.

## 2014-02-15 ENCOUNTER — Telehealth: Payer: Self-pay | Admitting: Nurse Practitioner

## 2014-02-15 NOTE — Telephone Encounter (Signed)
Requested records.

## 2014-02-15 NOTE — Telephone Encounter (Signed)
This was completed this am. Thanks!

## 2014-02-15 NOTE — Telephone Encounter (Signed)
Ms. Mkrtchyan called  To confirm whether or not a Referral has been sent to Dr. Layne Benton on her behalf. Please call the pt if you have any questions or concerns. Pt ph# [phone] P.S. Lorriane Shire also spoke to this pt earlier, in case you want to ask her what was discussed or if you have questions. Thank you.

## 2014-02-15 NOTE — Telephone Encounter (Signed)
The patient was discharged on 12.14.15 from [facility] . She has an appointment for 12.18.15 @ 11:00 with [facility].

## 2014-02-16 NOTE — Telephone Encounter (Signed)
Called to complete TCM call, no answer, no vm available

## 2014-02-18 ENCOUNTER — Ambulatory Visit: Payer: Medicare PPO | Admitting: Nurse Practitioner

## 2014-02-22 ENCOUNTER — Ambulatory Visit (INDEPENDENT_AMBULATORY_CARE_PROVIDER_SITE_OTHER): Payer: Medicare PPO | Admitting: Nurse Practitioner

## 2014-02-22 ENCOUNTER — Encounter: Payer: Self-pay | Admitting: Nurse Practitioner

## 2014-02-22 VITALS — BP 148/62 | HR 99 | Temp 97.7°F | Resp 14 | Ht 61.5 in | Wt 122.0 lb

## 2014-02-22 DIAGNOSIS — Z9889 Other specified postprocedural states: Principal | ICD-10-CM

## 2014-02-22 DIAGNOSIS — Z8781 Personal history of (healed) traumatic fracture: Secondary | ICD-10-CM

## 2014-02-22 MED ORDER — OXYCODONE-ACETAMINOPHEN 10-325 MG PO TABS: 1.0000 | ORAL_TABLET | Freq: Four times a day (QID) | ORAL | Status: DC | PRN

## 2014-02-22 MED ORDER — TIZANIDINE HCL 4 MG PO TABS: 4.0000 mg | ORAL_TABLET | Freq: Three times a day (TID) | ORAL | Status: DC

## 2014-02-22 MED ORDER — IBUPROFEN 800 MG PO TABS: 800.0000 mg | ORAL_TABLET | Freq: Two times a day (BID) | ORAL | Status: DC

## 2014-02-22 NOTE — Assessment & Plan Note (Signed)
Pt has had past kyphoplasty. She has current compression fracture and is out of the hospital. She is stable on Pain medications (New script for percocet, zanaflex, and ibuprofen 800 mg). Referral to osteoporosis program with the [facility]. She will FU with me in 4 weeks.

## 2014-02-22 NOTE — Patient Instructions (Signed)
We will follow up in 4 weeks.  We will call you about the referral for PT.   Call us if you need [place].

## 2014-02-22 NOTE — Progress Notes (Signed)
Subjective:    Patient ID: [name], female    DOB: 1941/02/02, 73 y.o.   MRN: [id number]  HPI  1) Patient is a follow up from the hospital after discharge for compression fracture of lumbar spine. She has diagnosed osteoporosis with failure of Forteo and Fosamx. She is interested in Prolia. Has seen Dr. Richardson Landry in past. She states the pain is lower back (around L1) and goes across back. She is still having spasms with the last one being before she arrived today. Pt is tearful and in pain. She also is having bilateral leg swelling. She is ambulating well with a walker. Uses shower chair at home and has help from husband.   Review of Systems  Constitutional: Positive for activity change. Negative for fever, chills, diaphoresis, fatigue and unexpected weight change.  Cardiovascular: Positive for leg swelling. Negative for chest pain and palpitations.  Gastrointestinal: Negative for nausea, vomiting and diarrhea.  Musculoskeletal: Positive for myalgias, back pain and gait problem. Negative for arthralgias, neck pain and neck stiffness.  Skin: Negative for rash.  Neurological: Negative for dizziness, tremors, syncope, numbness and headaches.   Past Medical History  Diagnosis Date  . UTI (lower urinary tract infection)   . Arthritis   . Osteoporosis   . Hepatitis C     History   Social History  . Marital Status: Married    Spouse Name: Ellianna Dudzinski    Number of Children: 0  . Years of Education: 14   Occupational History  . Receptionist     Retired in [place]  . Smoking status: Former Smoker -- 1.00 packs/day for 10 years  . Smokeless tobacco: Never Used  . Alcohol Use: No  . Drug Use: No  . Sexual Activity: Yes    Birth Control/ Protection: Post-menopausal   Other Topics Concern  . Not on file   Social History Narrative   Pt is 73 yo female, married to husband of 21 years.  Pt has no children.  Pt lives with husband and teacup poodle, [name].  Pt states she hurt her back last year and has not been able to participate in much physical activity since. Pt enjoys traveling with her husband in motor home, doing crossword puzzles with husband, and playing cards with friends.  Pt was raised in [place] and has lived in [place] entire adult life.  Pt worked in [employer] in [place] until [place] when they closed.      Past Surgical History  Procedure Laterality Date  . Back surgery  07/14    T10, L1    Family History  Problem Relation Age of Onset  . Arthritis Mother   . Heart disease Father   . Diabetes Father     Allergies  Allergen Reactions  . Sugar-Protein-Starch Nausea And Vomiting    Only to sugar    Current Outpatient Prescriptions on File Prior to Visit  Medication Sig Dispense Refill  . Calcium-Vitamin A-Vitamin D (LIQUID CALCIUM PO) Take by mouth. 1 tablespoon daily    . cholecalciferol (VITAMIN D) 1000 UNITS tablet Take 1,000 Units by mouth daily.    Marland Kitchen lisinopril (PRINIVIL,ZESTRIL) 10 MG tablet Take 1 tablet (10 mg total) by mouth daily. 90 tablet 3  . Magnesium 250 MG TABS Take 1 tablet by mouth daily.    . Multiple Vitamin (MULTIVITAMIN) tablet Take 1 tablet by mouth daily.    . NON FORMULARY Take 1 tablet by mouth  3 (three) times daily. Osteosine, herbal supplement    . ondansetron (ZOFRAN ODT) 4 MG disintegrating tablet Take 1 tablet (4 mg total) by mouth every 8 (eight) hours as needed for nausea or vomiting. 20 tablet 0   No current facility-administered medications on file prior to visit.      Objective:   Physical Exam  Constitutional: She is oriented to person, place, and time. She appears well-developed and well-nourished. She appears distressed.  Distressed about pain  HENT:  Head: Normocephalic and atraumatic.  Right Ear: External ear normal.  Left Ear: External ear normal.  Eyes: Conjunctivae and EOM are normal. Pupils are equal, round, and reactive to light. Right eye exhibits no  discharge. Left eye exhibits no discharge. No scleral icterus.  Neck: Normal range of motion.  Cardiovascular: Normal rate and regular rhythm.   Pulmonary/Chest: Effort normal and breath sounds normal.  Musculoskeletal: She exhibits edema. She exhibits no tenderness.  Non-pitting edema of LEs.   Neurological: She is alert and oriented to person, place, and time. She displays normal reflexes. She exhibits normal muscle tone. Coordination abnormal.  Gait is slow from pain  Skin: Skin is warm and dry. She is not diaphoretic.  Psychiatric: She has a normal mood and affect. Her behavior is normal. Judgment and thought content normal.  Tearful about being in pain    BP 148/62 mmHg  Temp(Src) 97.7 F (36.5 C) (Oral)  Resp 14  Ht 5' 1.5" (1.562 m)  Wt 122 lb (55.339 kg)  BMI 22.68 kg/m2      Assessment & Plan:

## 2014-02-22 NOTE — Progress Notes (Signed)
Pre visit review using our clinic review tool, if applicable. No additional management support is needed unless otherwise documented below in the visit note. 

## 2014-03-07 ENCOUNTER — Telehealth: Payer: Self-pay | Admitting: Nurse Practitioner

## 2014-03-07 NOTE — Telephone Encounter (Signed)
I have electronically sent pt's info for Prolia insurance verification and will notify you once I have a response. Thank you. °

## 2014-03-08 ENCOUNTER — Ambulatory Visit: Payer: Commercial Managed Care - HMO

## 2014-03-22 ENCOUNTER — Encounter: Payer: Self-pay | Admitting: Nurse Practitioner

## 2014-03-22 ENCOUNTER — Ambulatory Visit (INDEPENDENT_AMBULATORY_CARE_PROVIDER_SITE_OTHER): Payer: PPO | Admitting: Nurse Practitioner

## 2014-03-22 VITALS — BP 136/68 | HR 95 | Temp 98.1°F | Resp 12 | Ht 61.5 in | Wt 114.8 lb

## 2014-03-22 DIAGNOSIS — M810 Age-related osteoporosis without current pathological fracture: Secondary | ICD-10-CM

## 2014-03-22 MED ORDER — ETODOLAC ER 500 MG PO TB24: 500.0000 mg | ORAL_TABLET | Freq: Every day | ORAL | Status: DC

## 2014-03-22 NOTE — Progress Notes (Signed)
Subjective:    Patient ID: [name], female    DOB: 07-01-1940, 74 y.o.   MRN: [id number]  HPI  [name] is a 74 yo female with a CC of compression fractures of multiple vertebrae. She has documented osteoporosis.   1) Pt is currently taking Tramadol 50 mg once daily Etodolac 500 mg twice daily. These are helpful. She especially is pleased with the etodolac 500 mg and asking for refill. This was a trial from her orthopedist.   Asking about Prolia- I had placed a request in Dec. For Prolia since this is the last to try for [name] (other treatments were unsuccessful). Deirdre Peer was made aware via message that Pt's insurance changed as of Jan. 1st 2016 and a new approval will have to be obtained through current insurance. Pt is understanding, but she would like to get started ASAP.   2) Scheduled for PT Thursday. She sees Public librarian and will start PT with their group. Pt is excited.   Review of Systems  Constitutional: Negative for fever, chills, diaphoresis and fatigue.  Respiratory: Negative for chest tightness, shortness of breath and wheezing.   Cardiovascular: Negative for chest pain, palpitations and leg swelling.  Gastrointestinal: Negative for nausea, vomiting, abdominal pain, diarrhea, abdominal distention and rectal pain.  Musculoskeletal: Positive for back pain. Negative for myalgias, joint swelling, neck pain and neck stiffness.       Walking with rolling walker  Skin: Negative for rash.  Neurological: Negative for dizziness, weakness, numbness and headaches.  Psychiatric/Behavioral: The patient is not nervous/anxious.    Past Medical History  Diagnosis Date  . UTI (lower urinary tract infection)   . Arthritis   . Osteoporosis   . Hepatitis C     History   Social History  . Marital Status: Married    Spouse Name: Mabel Tomaro    Number of Children: 0  . Years of Education: 14   Occupational History  . 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 Smoking status: Former Smoker -- 1.00 packs/day for 10 years  . Smokeless tobacco: Never Used  . Alcohol Use: No  . Drug Use: No  . Sexual Activity: Yes    Birth Control/ Protection: Post-menopausal   Other Topics Concern  . Not on file   Social History Narrative   Pt is 74 yo female, married to husband of 21 years.  Pt has no children.  Pt lives with husband and teacup poodle, [name]. Pt states she hurt her back last year and has not been able to participate in much physical activity since. Pt enjoys traveling with her husband in motor home, doing crossword puzzles with husband, and playing cards with friends.  Pt was raised in [place] and has lived in [place] entire adult life.  Pt worked in [employer] in [place] until [place] when they closed.      Past Surgical History  Procedure Laterality Date  . Back surgery  07/14    T10, L1    Family History  Problem Relation Age of Onset  . Arthritis Mother   . Heart disease Father   . Diabetes Father     Allergies  Allergen Reactions  . Sugar-Protein-Starch Nausea And Vomiting    Only to sugar    Current Outpatient Prescriptions on File Prior to Visit  Medication Sig Dispense Refill  . Calcium-Vitamin A-Vitamin D (LIQUID CALCIUM PO) Take by mouth.  1 tablespoon daily    . cholecalciferol (VITAMIN D) 1000 UNITS tablet Take 1,000 Units by mouth daily.    Marland Kitchen ibuprofen (ADVIL,MOTRIN) 800 MG tablet Take 1 tablet (800 mg total) by mouth 2 (two) times daily. 60 tablet 0  . lisinopril (PRINIVIL,ZESTRIL) 10 MG tablet Take 1 tablet (10 mg total) by mouth daily. 90 tablet 3  . Magnesium 250 MG TABS Take 1 tablet by mouth daily.    . Multiple Vitamin (MULTIVITAMIN) tablet Take 1 tablet by mouth daily.    . ondansetron (ZOFRAN ODT) 4 MG disintegrating tablet Take 1 tablet (4 mg total) by mouth every 8 (eight) hours as needed for nausea or vomiting. 20 tablet 0  . oxyCODONE-acetaminophen (PERCOCET) 10-325 MG per  tablet Take 1 tablet by mouth every 6 (six) hours as needed for pain. 40 tablet 0  . tiZANidine (ZANAFLEX) 4 MG tablet Take 1 tablet (4 mg total) by mouth 3 (three) times daily. 30 tablet 0   No current facility-administered medications on file prior to visit.       Objective:   Physical Exam  Constitutional: She is oriented to person, place, and time. She appears well-developed and well-nourished. No distress.  BP 136/68 mmHg  Pulse 95  Temp(Src) 98.1 F (36.7 C) (Oral)  Resp 12  Ht 5' 1.5" (1.562 m)  Wt 114 lb 12.8 oz (52.073 kg)  BMI 21.34 kg/m2  SpO2 98%   HENT:  Head: Normocephalic and atraumatic.  Right Ear: External ear normal.  Left Ear: External ear normal.  Cardiovascular: Normal rate, regular rhythm, normal heart sounds and intact distal pulses.  Exam reveals no gallop and no friction rub.   No murmur heard. Pulmonary/Chest: Effort normal and breath sounds normal. No respiratory distress. She has no wheezes. She has no rales. She exhibits no tenderness.  Neurological: She is alert and oriented to person, place, and time. She displays normal reflexes. No cranial nerve deficit. She exhibits abnormal muscle tone. Coordination abnormal.  Pt ambulating with walker. Pt is weak from recent fracture and is osteoporotic.   Skin: Skin is warm and dry. No rash noted. She is not diaphoretic.  Psychiatric: She has a normal mood and affect. Her behavior is normal. Judgment and thought content normal.      Assessment & Plan:

## 2014-03-22 NOTE — Progress Notes (Signed)
Pre visit review using our clinic review tool, if applicable. No additional management support is needed unless otherwise documented below in the visit note. 

## 2014-03-22 NOTE — Patient Instructions (Signed)
I will follow up about the Prolia We will contact you.  If you need refills please contact the pharmacy.

## 2014-03-23 ENCOUNTER — Other Ambulatory Visit: Payer: Self-pay | Admitting: Nurse Practitioner

## 2014-03-23 NOTE — Telephone Encounter (Signed)
Pt wants regular etodolac, not the XL, pharmacy sent request for what she is wanting. Ok?

## 2014-03-24 ENCOUNTER — Ambulatory Visit: Payer: PPO | Admitting: Physical Therapy

## 2014-03-24 NOTE — Telephone Encounter (Signed)
I have updated pt's current insurance into her Prolia profile to send for another verification.  I will notify you once I have a response. Thank you.

## 2014-03-25 NOTE — Assessment & Plan Note (Signed)
Improved. Pt is interested in starting prolia injections. Deirdre Peer was contacted in order to get approval from [facility]. Rx for etodolac 500 mg daily. Continue with pain medications such as tramadol and zanaflex. Pt not using percocet she reports. Pt is in good spirits and looks to be in less pain today. She is est. With [facility] and will begin PT on next Thursday.

## 2014-03-29 NOTE — Telephone Encounter (Signed)
Called patient and notified.  Pt is awaiting a response as to when Prolia will be available.

## 2014-03-29 NOTE — Telephone Encounter (Signed)
The patient called hoping to find out if her prolia injection was ordered and if she could go ahead and schedule.  Is the medication here in the office?  Pt callback - [phone]

## 2014-03-29 NOTE — Telephone Encounter (Signed)
Ms. Stoecklein was told that due to her insurance change it had to be ran again. Ms. Doug Sou did that last week and stated she will notify me when she gets a response. No response yet, please let her know. Thanks!

## 2014-03-30 NOTE — Telephone Encounter (Signed)
Thanks

## 2014-04-04 ENCOUNTER — Telehealth: Payer: Self-pay

## 2014-04-04 MED ORDER — DENOSUMAB 60 MG/ML ~~LOC~~ SOLN: 60.0000 mg | SUBCUTANEOUS | Status: DC

## 2014-04-04 NOTE — Telephone Encounter (Signed)
The patient called hoping to get a call back from the nurse about her medications.  She didn't specifically state what problems she was having, except for she was having "communication problems"

## 2014-04-04 NOTE — Telephone Encounter (Signed)
Please contact Ms. Regula and let her know the following information. Thanks!

## 2014-04-04 NOTE — Telephone Encounter (Signed)
Noted! Thank you

## 2014-04-04 NOTE — Telephone Encounter (Signed)
Pt notified. States she talked to her insurance and they told her she could get a Rx sent to the pharmacy and it would cost her $45 and she could bring it to the office to have it given. She is scheduled for nurse visit 04/07/14. Rx sent to pharmacy by escript

## 2014-04-04 NOTE — Telephone Encounter (Signed)
I have rec'd pt's insurance verification for Prolia.  If an OV is billed pt will have an estimated responsibility of a $35 co-pay plus 20% co-insurance (approx $180) for a total estimated responsibility of $215; w/out an OV pt will will have an estimated responsibility of the 20% co-insurance, which is appox $180.  Please make pt aware this is an estimate and we will not know an exact amt until insurance has paid.  I have sent a copy of the summary of benefits to be scanned into pt's chart. If the pt cannot afford $180-$215 then please ask her to call [employer]ia at [phone] to see if she qualifies for one of their assistance programs.  If she qualifies, they will instruct her how to proceed.  If you have any questions, please let me know. Thank you.

## 2014-04-07 ENCOUNTER — Ambulatory Visit: Payer: PPO

## 2014-04-13 ENCOUNTER — Ambulatory Visit (INDEPENDENT_AMBULATORY_CARE_PROVIDER_SITE_OTHER): Payer: PPO

## 2014-04-13 DIAGNOSIS — M810 Age-related osteoporosis without current pathological fracture: Secondary | ICD-10-CM

## 2014-04-13 MED ORDER — DENOSUMAB 60 MG/ML ~~LOC~~ SOLN
60.0000 mg | Freq: Once | SUBCUTANEOUS | Status: AC
  Administered 2014-04-13: 60 mg via SUBCUTANEOUS

## 2014-04-13 NOTE — Progress Notes (Signed)
Patient ID: [name], female   DOB: Dec 07, 1940, 74 y.o.   MRN: [id number] Patient was seen today in a nurse visit to receive a Prolia injection.  Prolia was given subcutaneously in left arm.  Patient tolerated well.  No redness or swelling at the site.  Patient made appointment to follow up with labs in one month.

## 2014-04-13 NOTE — Addendum Note (Signed)
Addended by: Dia Crawford on: 04/13/2014 02:52 PM   Modules accepted: Orders

## 2014-04-17 ENCOUNTER — Other Ambulatory Visit: Payer: Self-pay | Admitting: Nurse Practitioner

## 2014-04-18 NOTE — Telephone Encounter (Signed)
OK to fill

## 2014-05-12 ENCOUNTER — Telehealth: Payer: Self-pay | Admitting: *Deleted

## 2014-05-12 ENCOUNTER — Other Ambulatory Visit (INDEPENDENT_AMBULATORY_CARE_PROVIDER_SITE_OTHER): Payer: PPO

## 2014-05-12 DIAGNOSIS — M810 Age-related osteoporosis without current pathological fracture: Secondary | ICD-10-CM

## 2014-05-12 LAB — COMPREHENSIVE METABOLIC PANEL
ALBUMIN: 4.6 g/dL (ref 3.5–5.2)
ALT: 14 U/L (ref 0–35)
AST: 23 U/L (ref 0–37)
Alkaline Phosphatase: 81 U/L (ref 39–117)
BUN: 25 mg/dL — ABNORMAL HIGH (ref 6–23)
CALCIUM: 10.2 mg/dL (ref 8.4–10.5)
CO2: 23 mEq/L (ref 19–32)
Chloride: 101 mEq/L (ref 96–112)
Creatinine, Ser: 0.93 mg/dL (ref 0.40–1.20)
GFR: 62.87 mL/min (ref >60.00)
GLUCOSE: 90 mg/dL (ref 70–99)
POTASSIUM: 5 meq/L (ref 3.5–5.1)
SODIUM: 137 meq/L (ref 135–145)
Total Bilirubin: 0.5 mg/dL (ref 0.2–1.2)
Total Protein: 7.8 g/dL (ref 6.0–8.3)

## 2014-05-12 LAB — MAGNESIUM: MAGNESIUM: 2.5 mg/dL (ref 1.5–2.5)

## 2014-05-12 NOTE — Telephone Encounter (Signed)
Sorry, CMET and magnesium. Thanks!

## 2014-05-12 NOTE — Telephone Encounter (Signed)
What dx?

## 2014-05-12 NOTE — Telephone Encounter (Signed)
Osteoporosis Sorry again

## 2014-05-12 NOTE — Telephone Encounter (Signed)
Labs and dx?  

## 2014-06-25 NOTE — H&P (Signed)
PATIENT NAME:  [name], [name] MR#:  [id number] DATE OF BIRTH:  1940/06/03  DATE OF ADMISSION:  02/13/2014  REFERRING PHYSICIAN: Sheryl L. Benjaman Lobe, MD, in the Emergency Room.   FAMILY PHYSICIAN: Nonlocal.   REASON FOR ADMISSION: Intractable back pain.   HISTORY OF PRESENT ILLNESS: The patient is a 74 year old female with a history of osteoporosis, degenerative disk disease, previous compression fractures, and previous back surgery. She is followed at [facility] by orthopedics and pain management. Presents to the Emergency Room here with a 3 to 4-day history of worsening back pain. Unable to ambulate. Having lower extremity paresthesias. Denies acute trauma. No true weakness. In the Emergency Room, the patient was given repetitive doses of p.o. and IV pain medications, with no improvement of her pain. She is now admitted for further evaluation.   PAST MEDICAL HISTORY: 1.  Osteoporosis.  2.  Degenerative disk disease.  3.  History of compression fractures.  4.  History of hepatitis.  5.  Benign hypertension.  6.  Status post back surgery.   MEDICATIONS: 1.  Zofran 4 mg p.o. t.i.d. as needed.  2.  Lisinopril 5 mg p.o. daily.  3.  Vitamin D3 of 1000 units p.o. daily.  4.  Multivitamin 1 p.o. daily.   ALLERGIES: No known drug allergies.   SOCIAL HISTORY: The patient is a former smoker, but not recently. No history of alcohol abuse.   FAMILY HISTORY: Positive for hypertension and coronary artery disease, but otherwise unremarkable.  REVIEW OF SYSTEMS:  CONSTITUTIONAL: No fever or change in weight.  EYES: No blurred or double vision. No glaucoma.  ENT: No tinnitus or hearing loss. No nasal discharge or bleeding. No difficulty swallowing.  RESPIRATORY: No cough or wheezing. Denies hemoptysis. No painful respiration.  CARDIOVASCULAR: No chest pain or orthopnea. No palpitations or syncope.  GASTROINTESTINAL: No nausea, vomiting, or diarrhea. No abdominal pain. No change in bowel habits.   GENITOURINARY: No dysuria or hematuria. No incontinence.  ENDOCRINE: No polyuria or polydipsia. No heat or cold intolerance.  HEMATOLOGIC: The patient denies anemia, easy bruising, or bleeding.  LYMPHATIC: No swollen glands.  MUSCULOSKELETAL: The patient has pain in her neck, shoulders, knees, hips. No gout.  NEUROLOGIC: No migraines, stroke, or seizures.  PSYCHOLOGICAL: The patient denies anxiety, insomnia, depression.   PHYSICAL EXAMINATION: GENERAL: The patient is in no acute distress. Complaining of severe pain.  VITAL SIGNS: Currently remarkable for a blood pressure 114/47, with a heart rate of 82, respiratory rate of 18, temperature of 97.9, saturation 98% on room air.  HEENT: Normocephalic, atraumatic. Pupils equal, round, and reactive to light and accommodation. Extraocular movements are intact. Sclerae nonicteric. Conjunctivae are clear. Oropharynx is clear.  NECK: Supple without JVD. No adenopathy or thyromegaly is noted.  LUNGS: Clear to auscultation and percussion without wheezes, rales, or rhonchi. No dullness. Respiratory effort is normal.  CARDIAC: Regular rate and rhythm. Normal S1, S2. No significant rubs, murmurs, or gallops. PMI is nondisplaced. Chest wall is nontender.  ABDOMEN: Soft, nontender, with normoactive bowel sounds. No organomegaly or masses appreciated. No hernias or bruits were noted.  EXTREMITIES: Without clubbing, cyanosis, or edema. Pulses were 2+ bilaterally.  SKIN: Warm and dry without rash or lesions.  NEUROLOGIC: Revealed cranial nerves II through XII grossly intact. Deep tendon reflexes are symmetric. Motor and sensory examination is nonfocal.  PSYCHIATRIC: Revealed a patient who is alert and oriented to person, place, and time. She was cooperative and used good judgment.   LABORATORY DATA: Glucose was  96 with a BUN of 20, creatinine of 0.83. GFR of greater than 60. Her white count was 10.6 with a hemoglobin of 14.4. MRI of the lumbar spine revealed an  acute compression fracture of L2 with 20% deformity. Multiple other compression deformities were found that were nonacute as well.   ASSESSMENT: 1.  Intractable back pain.  2.  New L2 compression fracture.  3.  Degenerative disk disease.  4.  Osteoporosis.  5.  Benign hypertension.  6.  Mild dehydration.   PLAN: The patient will be observed on the orthopedics floor with IV pain medications as needed. We will begin steroids, muscle relaxants, and heat. We will consult physical therapy and orthopedics. We will obtain a care management consult for discharge planning. We will continue her lisinopril. Zofran as needed. Follow up routine labs in the morning. Further treatment and evaluation will depend upon the patient's progress.  TOTAL TIME SPENT ON THIS PATIENT: 45 minutes.    ____________________________ Leonie Douglas Doy Hutching, MD jds:sw D: 02/13/2014 15:04:56 ET T: 02/13/2014 15:18:24 ET JOB#: [id number]  cc: Leonie Douglas. Doy Hutching, MD, <Dictator> JEFFREY Lennice Sites MD ELECTRONICALLY SIGNED 02/13/2014 17:08

## 2014-06-25 NOTE — Consult Note (Signed)
PATIENT NAME:  [name], [name] MR#:  [id number] DATE OF BIRTH:  Jun 01, 1940  DATE OF CONSULTATION:  02/13/2014  REFERRING PHYSICIAN:   CONSULTING PHYSICIAN:  Deeann Dowse. Laurance Flatten, MD  CHIEF COMPLAINT: Intractable low back pain.  HISTORY OF PRESENT ILLNESS: The patient is a very pleasant 74 year old Caucasian female who does have a longstanding history of low back pain that has been associated with degenerative disk disease, osteoporosis and multiple compression fractures that have been treated in the past both with pain medicine, bracing, as well as the patient has even undergone vertebroplasty or kyphoplasty by Dr. Matthew Saras in [place] at levels at T10 and L1. The patient did have pretty positive outcomes after her last kyphoplasty. However, she has had some residual low back pain. It has been getting progressively worse over the past 3-4 days. She was taking some 800 mg ibuprofen, was not able to control the pain. She denies at any point in time any change in her bowel or bladder habits. She denies any pain radiating down her legs. She denies any headaches, fevers, chills, night sweats. She does admit that she has some left foot numbness and tingling that happens occasionally, but has not been associated with those most recent bout of low back pain.  REVIEW OF SYSTEMS: Otherwise negative.  PAST MEDICAL HISTORY: Positive for: 1.  Lumbar degenerative disk disease. 2.  Osteoporosis. 3.  Hypertension. 4.  Hepatitis.  PAST SURGICAL HISTORY: Status post kyphoplasty at T10, L1 for osteoporotic compression fractures.  CURRENT MEDICATIONS: Lisinopril, vitamin D and a multivitamin.  ALLERGIES: None.  SOCIAL HISTORY: The patient lives with her husband. She denies any current tobacco or alcohol use.  PHYSICAL EXAMINATION: VITAL SIGNS: Afebrile. Vital signs stable. GENERAL: The patient is lying supine in the hospital bed accompanied by her husband. She is sitting in a semi-flexed position with a pillow  under her knees for comfort. She is awake, alert and oriented x 3. SPINE: She has pain with any attempted flexion or extension of her back. She has tenderness to palpation over the posterior aspect of her lumbar spine. Otherwise, no midline tenderness. No lesions, abrasions over her back. MOTOR: She has 4+/5 strength from L2 through S1. Her sensation is intact to light touch from L2 through S1. No hyperreflexia. No clonus. RECTAL: Deferred secondary to no red flags, no weakness and her sensation being intact.  IMAGING: Available for review: Multiple views of x-ray of her lumbar spine show that she does have degenerative disk disease with multiple lumbar compression fractures with a significant loss of height at L1 and some narrowing at L2.  MRI available that was performed today does demonstrate acute uptake at L2 with compression fracture at L2. Otherwise, all other compression deformities are chronic in nature. No significant compression about the cord and no cord signal changes.  ASSESSMENT AND PLAN: A 74 year old female with acute L2 osteoporotic compression fracture and multiple chronic compression fractures with acute intractable back pain.  No concern at this point in time for any cauda equina or any surgical need.  Continue current conservative treatment including IV pain medications, IV steroids, muscle relaxers, physical therapy. I encourage out of bed and mobilization.  I did discuss brace therapy with the patient. They have multiple back braces and wraps at home. They actually cause her more pain, therefore she declined any abdominal binder or back brace.  We also discussed kyphoplasty as a possible treatment for her acute L2 compression fracture. She is not interested in any kyphoplasty at  this point in time. We did discuss that she could follow up with her previous surgeon, Dr. Matthew Saras in [place] if she is interested in undergoing kyphoplasty.  Again, no acute orthopedic  intervention at this point in time. We will sign off. Please call with any questions or concerns.   ____________________________ Deeann Dowse Laurance Flatten, MD bfm:TT D: 02/13/2014 16:55:40 ET T: 02/13/2014 17:15:59 ET JOB#: [id number]  cc: Deeann Dowse. Laurance Flatten, MD, <Dictator> Rodena Medin MD ELECTRONICALLY SIGNED 02/13/2014 22:40

## 2014-06-25 NOTE — Discharge Summary (Signed)
PATIENT NAMECHYRA, [name] MR#:  [id number] DATE OF BIRTH:  09-13-1940  DATE OF ADMISSION:  02/13/2014 DATE OF DISCHARGE:  02/14/2014  DISCHARGE DIAGNOSES:  1. New L2 compression fracture.  2. Severe osteoporosis.  3. History of multiple spinal compression fractures in the past with kyphoplasty.  4. Hypertension.  5. Degenerative joint disease.   CONSULTATIONS: [name]. Kelly Flatten, MD, orthopedics.   PROCEDURES:  1. X-ray of the lumbar spine shows progressive compression deformities of T10, L1, L2, L3 and L4 since 02/15/2014.  2. MRI of the lumbar spine without contrast shows acute 20% benign compression deformity of L2 with mild associated osseous retropulsion, but no conus compression. Multiple other compression deformities have developed over the past 2 years.  These are located at L3, L4, L5.  There is residual minimal edema superior at the L3 vertebral body. The previously demonstrated acute fracture of L1 has healed with progressive loss of height. Chronic T12 fracture appears unchanged. Stable mild spondylosis, no significant spinal stenosis or nerve root encroachment.   HISTORY OF PRESENT ILLNESS: This is a 74 year old woman with a history of osteoporosis, degenerative disk disease and previous compression fractures treated with kyphoplasty presents with worsening of back pain. She is followed by [name] pain management. She presents with 3 to 4 day history of worsening back pain. At the point of presentation, she was unable to ambulate. She denies any acute trauma. No true weakness. She was given IV pain medication in the Emergency Room without resolution and was admitted for further evaluation.    HOSPITAL COURSE BY PROBLEM:  1. New acute L2 compression fracture: The patient was seen by [name] in hospital. There was no acute concern for cauda equina or surgical intervention. [name] discussed bracing with the patient, but she already has multiple back braces and  wraps at home, which she has not found helpful. Did discuss kyphoplasty and the patient declined at this time. If she is interested in kyphoplasty, she will follow up with her surgeon, [name] in [place]. There were no acute orthopedic interventions. She was seen by physical therapy and was referred to the [facility] program for osteoporosis as an outpatient. She needs a walker at home and this was provided. At the time of discharge, her pain is fairly well controlled with Percocet and Flexeril as needed.  2. Severe osteoporosis: The patient has tried multiple different treatments for osteoporosis, but has not been able to tolerate them. She follows with [name] in osteoporosis clinic and will need to follow up there.  There were no changes to her medical regimen at this time.  3. Hypertension: Blood pressures were well controlled on home regimen. She will continue on lisinopril 5 mg daily.   CONDITION ON DISCHARGE: Stable.   DISCHARGE PHYSICAL EXAMINATION:  VITAL SIGNS: Temperature 97.9, pulse 80, respirations 18, blood pressure 146/72, oxygenation 95% on room air.  GENERAL: No acute distress, mildly uncomfortable with movement but comfortable and at rest.  CARDIOVASCULAR: Regular rate and rhythm. No murmurs, rubs, or gallops. No peripheral edema. Peripheral pulses 2+.  PULMONARY: Lungs are clear to auscultation bilaterally with good air movement.  MUSCULOSKELETAL: Strength is 5/5 throughout all extremities.  No joint effusions or deformities, range of motion is normal.  NEUROLOGIC: Cranial nerves II through XII grossly intact. Sensation and strength are intact, nonfocal examination.  PSYCHIATRIC: The patient is alert and oriented and has good insight into her clinical conditions.   LABORATORY DATA:  Sodium 139,  potassium 4.0, chloride 106, bicarbonate 28, BUN 16, creatinine 0.86, glucose 109. White blood cells 6.5, hemoglobin 12.9, platelets 212,000, MCV 93.   IMAGING:  As above.     DISCHARGE MEDICATIONS:   1. Lisinopril 5 mg daily.  2. Centrum Silver therapeutic multivitamin 1 tablet daily.  3. Vitamin D3 at 1000 units 1 tablet daily.  4. Zofran 4 mg 1 tablet 3 times a day as needed for nausea.   5. Acetaminophen/oxycodone 325 mg/10 mg 1 tablet every 6 hours as needed for pain.  6. Aspirin 81 mg daily.  7. Cyclobenzaprine 10 mg 1 tablet every 8 hours as needed for pain.  8. Acetaminophen 325, two tablets every 4 hours as needed for pain.  9.  Pediatric size walker to be delivered to her home.   10. Shower chair to be delivered to her home.   DISPOSITION: The patient is being discharged home care of her family. She will participate in the outpatient physical therapy [facility] program.    DISCHARGE INSTRUCTIONS:  DIET: Regular diet, no limitations.  ACTIVITY: As tolerated.  TIMEFRAME FOR FOLLOW-UP:  Follow up in 1 to 2 weeks with your primary care physician. Follow up in 1 to 2 weeks with [name] to follow up on osteoporosis.   TIME SPENT ON DISCHARGE: 40 minutes.    ____________________________ [name]. Kelly Napoleon, MD cpw:by D: 02/16/2014 12:03:29 ET T: 02/16/2014 20:35:48 ET JOB#: [id number]  cc: [name]. Kelly Napoleon, MD, <Dictator> Aldean Jewett MD ELECTRONICALLY SIGNED 02/23/2014 9:09

## 2014-08-03 ENCOUNTER — Telehealth: Payer: Self-pay | Admitting: *Deleted

## 2014-08-03 ENCOUNTER — Other Ambulatory Visit: Payer: Self-pay | Admitting: *Deleted

## 2014-08-03 MED ORDER — TRAMADOL HCL 50 MG PO TABS: 50.0000 mg | ORAL_TABLET | Freq: Three times a day (TID) | ORAL | Status: DC | PRN

## 2014-08-03 NOTE — Telephone Encounter (Signed)
Pt called requesting Tramadol refill.  Last OV 1.19.16.  It appears you have not filled this Rx for pt before.  Please advise refill

## 2014-08-03 NOTE — Telephone Encounter (Signed)
Spoke with pt, aware Rx to be faxed

## 2014-08-03 NOTE — Telephone Encounter (Signed)
Tramadol 50 mg three times a day as needed for pain. # 90, No refills. Thanks!

## 2014-09-02 ENCOUNTER — Telehealth: Payer: Self-pay | Admitting: Nurse Practitioner

## 2014-09-02 NOTE — Telephone Encounter (Signed)
Prolia would be due after 10/12/14, thanks!

## 2014-09-02 NOTE — Telephone Encounter (Signed)
Pt called to check the status of getting a prolia shot. Is the verification still valid from [facility]

## 2014-09-06 NOTE — Telephone Encounter (Signed)
I have electronically sent patient's info for [place] verification and will notify you once I have a response. Thank you.

## 2014-09-12 ENCOUNTER — Telehealth: Payer: Self-pay

## 2014-09-12 NOTE — Telephone Encounter (Signed)
Pt wants to know if she needs labs before her next visit, however she does not have anything scheduled. Please advise.

## 2014-09-12 NOTE — Telephone Encounter (Signed)
The patient called hoping to get information regarding if she needs lab work before he next apt.

## 2014-09-13 ENCOUNTER — Other Ambulatory Visit: Payer: Self-pay | Admitting: Nurse Practitioner

## 2014-09-13 DIAGNOSIS — M810 Age-related osteoporosis without current pathological fracture: Secondary | ICD-10-CM

## 2014-09-13 NOTE — Telephone Encounter (Signed)
She will need to get her labs done prior to her Prolia shot, which is due after 10/12/14 and she is not scheduled. Kalman Shan is working on getting it ordered. Thanks!

## 2014-09-13 NOTE — Telephone Encounter (Signed)
Informed pt of  Prolia shots and labs, scheduled lab appt for 7.21.16 @ 9:45 fasting, pt verbilized understanding

## 2014-09-13 NOTE — Telephone Encounter (Signed)
-----   Message from Rubbie Battiest, NP sent at 09/13/2014  7:09 AM EDT ----- [name] needs to be fasting for labs FYI

## 2014-09-13 NOTE — Telephone Encounter (Signed)
Informed pt that the labs are fasting and made lab appt for 6.21.16 @ 9:45

## 2014-09-19 NOTE — Telephone Encounter (Signed)
Rec'd a letter from [employer] that pt's insurance terminated 11/02/2006, which I knew wasn't possible w/[employer]. I called them and spoke to [facility] and asked if they has possibly tried to verify an old plan.  He advised they had try to verifiy w/[employer], but would correct this and verify as a stat request. I'll notify you as soon as I have an updated response. Thank you.

## 2014-09-19 NOTE — Telephone Encounter (Signed)
I have rec'd pt's insurance verification for Prolia.  Pt will have an estimated responsibility of 20% co-insurance (approximately $180) plus $15 co-pay, which will cover the admin plus an OV if billed, for a total estimated responsibility of $195.  Please make her aware this is an estimate and we will not know an exact amt until her insurance has paid.  I have sent a copy of the summary of benefits to be scanned into her chart.  If pt cannot afford $195 for her injection, please advise her to contact Prolia at [phone] and select option #1 to see if she qualifies for one of their assistance programs.  If she qualifies they will instruct her how to proceed.  Please let me know if you have any questions.  Also, once she rec's her injection, please send me the actual injection date so I can update the Prolia portal. Thank you.

## 2014-09-19 NOTE — Telephone Encounter (Signed)
Informed pt of verification of Prolia and estimated cost, pt verbalized undesrtanding and will let her know when the injection comes in.

## 2014-09-22 ENCOUNTER — Other Ambulatory Visit: Payer: PPO

## 2014-09-25 ENCOUNTER — Other Ambulatory Visit: Payer: Self-pay | Admitting: Nurse Practitioner

## 2014-09-26 NOTE — Telephone Encounter (Signed)
Ok to fill 

## 2014-10-04 ENCOUNTER — Telehealth: Payer: Self-pay

## 2014-10-04 ENCOUNTER — Other Ambulatory Visit (INDEPENDENT_AMBULATORY_CARE_PROVIDER_SITE_OTHER): Payer: PPO

## 2014-10-04 DIAGNOSIS — M810 Age-related osteoporosis without current pathological fracture: Secondary | ICD-10-CM

## 2014-10-04 LAB — COMPREHENSIVE METABOLIC PANEL
ALT: 16 U/L (ref 0–35)
AST: 24 U/L (ref 0–37)
Albumin: 4.4 g/dL (ref 3.5–5.2)
Alkaline Phosphatase: 55 U/L (ref 39–117)
BUN: 20 mg/dL (ref 6–23)
CHLORIDE: 104 meq/L (ref 96–112)
CO2: 24 meq/L (ref 19–32)
Calcium: 9.5 mg/dL (ref 8.4–10.5)
Creatinine, Ser: 0.96 mg/dL (ref 0.40–1.20)
GFR: 60.55 mL/min (ref >60.00)
Glucose, Bld: 110 mg/dL — ABNORMAL HIGH (ref 70–99)
Potassium: 4.4 mEq/L (ref 3.5–5.1)
Sodium: 139 mEq/L (ref 135–145)
TOTAL PROTEIN: 7.6 g/dL (ref 6.0–8.3)
Total Bilirubin: 0.7 mg/dL (ref 0.2–1.2)

## 2014-10-04 LAB — MAGNESIUM: Magnesium: 2.2 mg/dL (ref 1.5–2.5)

## 2014-10-04 NOTE — Telephone Encounter (Signed)
-----   Message from Rubbie Battiest, NP sent at 10/04/2014  8:56 AM EDT ----- Please let Ms. Sarracino know that she is due for her next Bone Density  scan in November 2017 according to recommendations for Prolia and for osteoporosis.   Thanks!  Lorane Gell

## 2014-10-04 NOTE — Telephone Encounter (Signed)
Informed pt about next bone density scan, pt verbalized understanding

## 2014-10-05 NOTE — Telephone Encounter (Signed)
Ordered Prolia injection.

## 2014-10-17 ENCOUNTER — Ambulatory Visit (INDEPENDENT_AMBULATORY_CARE_PROVIDER_SITE_OTHER): Payer: PPO | Admitting: Nurse Practitioner

## 2014-10-17 VITALS — BP 126/68 | HR 106 | Temp 97.5°F | Resp 14 | Ht 61.5 in | Wt 109.8 lb

## 2014-10-17 DIAGNOSIS — M810 Age-related osteoporosis without current pathological fracture: Secondary | ICD-10-CM

## 2014-10-17 DIAGNOSIS — R531 Weakness: Secondary | ICD-10-CM | POA: Diagnosis not present

## 2014-10-17 LAB — CBC WITH DIFFERENTIAL/PLATELET
BASOS PCT: 0.5 % (ref 0.0–3.0)
Basophils Absolute: 0 10*3/uL (ref 0.0–0.1)
EOS PCT: 2.5 % (ref 0.0–5.0)
Eosinophils Absolute: 0.2 10*3/uL (ref 0.0–0.7)
HEMATOCRIT: 44.1 % (ref 36.0–46.0)
HEMOGLOBIN: 14.4 g/dL (ref 12.0–15.0)
LYMPHS PCT: 22.5 % (ref 12.0–46.0)
Lymphs Abs: 1.8 10*3/uL (ref 0.7–4.0)
MCHC: 32.8 g/dL (ref 30.0–36.0)
MCV: 93.2 fl (ref 78.0–100.0)
MONOS PCT: 6.7 % (ref 3.0–12.0)
Monocytes Absolute: 0.5 10*3/uL (ref 0.1–1.0)
Neutro Abs: 5.3 10*3/uL (ref 1.4–7.7)
Neutrophils Relative %: 67.8 % (ref 43.0–77.0)
Platelets: 235 10*3/uL (ref 150.0–400.0)
RBC: 4.73 Mil/uL (ref 3.87–5.11)
RDW: 14.7 % (ref 11.5–15.5)
WBC: 7.8 10*3/uL (ref 4.0–10.5)

## 2014-10-17 LAB — VITAMIN B12: Vitamin B-12: 635 pg/mL (ref 211–911)

## 2014-10-17 MED ORDER — ONDANSETRON 4 MG PO TBDP: 4.0000 mg | ORAL_TABLET | Freq: Three times a day (TID) | ORAL | Status: DC | PRN

## 2014-10-17 NOTE — Patient Instructions (Addendum)
Mag-07 tablets or Smooth move tea (purple box)  Try B12 Vitamin   We will call you with your referral.   Follow up in 2-4 weeks for weakness.

## 2014-10-17 NOTE — Progress Notes (Signed)
Patient ID: [name], female    DOB: 11-13-1940  Age: 74 y.o. MRN: [id number]  CC: Fatigue   HPI [name] presents for multiple complaints.   1) Weakness and fatigue worsening in the last 2-3 weeks.    Morning congestion is more in the morning, noticed about 2-3 week ago, not coughing up anything. Weakness has been going on about 2 years until recently she has been able to walk on the 10 minutes at a time and now it's about 3 minutes, she gets tired showering, blow drying hair or walking back to the exam room. Sleeps in a lift chair  2) Tramadol- constipation Phillips milk of mag  Miralax- helped the most  Phillips tablets Dulcolax  Equate vegetable laxative  Fiber supplements   3) Can't clear throat like she used to she reports EGD in past without findings pt report  History [name] has a past medical history of UTI (lower urinary tract infection); Arthritis; Osteoporosis; and Hepatitis C.   She has past surgical history that includes Back surgery (07/14).   Her family history includes Arthritis in her mother; Diabetes in her father; Heart disease in her father.She reports that she has quit smoking. She has never used smokeless tobacco. She reports that she does not drink alcohol or use illicit drugs.  Outpatient Prescriptions Prior to Visit  Medication Sig Dispense Refill  . Calcium-Vitamin A-Vitamin D (LIQUID CALCIUM PO) Take by mouth. 1 tablespoon daily    . cholecalciferol (VITAMIN D) 1000 UNITS tablet Take 1,000 Units by mouth daily.    Marland Kitchen denosumab (PROLIA) 60 MG/ML SOLN injection Inject 60 mg into the skin every 6 (six) months. 1 Syringe 0  . etodolac (LODINE XL) 500 MG 24 hr tablet Take 1 tablet (500 mg total) by mouth daily. 60 tablet 0  . etodolac (LODINE) 500 MG tablet TAKE 1 TABLET BY MOUTH TWICE A DAY 30 tablet 0  . ibuprofen (ADVIL,MOTRIN) 800 MG tablet Take 1 tablet (800 mg total) by mouth 2 (two) times daily. 60 tablet 0  . lisinopril (PRINIVIL,ZESTRIL)  10 MG tablet Take 1 tablet (10 mg total) by mouth daily. 90 tablet 3  . Magnesium 250 MG TABS Take 1 tablet by mouth daily.    . Multiple Vitamin (MULTIVITAMIN) tablet Take 1 tablet by mouth daily.    Marland Kitchen oxyCODONE-acetaminophen (PERCOCET) 10-325 MG per tablet Take 1 tablet by mouth every 6 (six) hours as needed for pain. 40 tablet 0  . tiZANidine (ZANAFLEX) 4 MG tablet Take 1 tablet (4 mg total) by mouth 3 (three) times daily. 30 tablet 0  . traMADol (ULTRAM) 50 MG tablet TAKE 1 TABLET BY MOUTH 3 TIMES A DAY AS NEEDED FOR PAIN 90 tablet 0  . ondansetron (ZOFRAN ODT) 4 MG disintegrating tablet Take 1 tablet (4 mg total) by mouth every 8 (eight) hours as needed for nausea or vomiting. 20 tablet 0   No facility-administered medications prior to visit.    ROS Review of Systems  Constitutional: Positive for fatigue. Negative for fever, chills and diaphoresis.  HENT: Positive for congestion. Negative for trouble swallowing.   Respiratory: Positive for cough and shortness of breath. Negative for chest tightness and wheezing.   Cardiovascular: Negative for chest pain, palpitations and leg swelling.  Gastrointestinal: Negative for nausea, vomiting, diarrhea and constipation.  Musculoskeletal: Positive for back pain, arthralgias and gait problem. Negative for myalgias, neck pain and neck stiffness.  Skin: Negative for rash.  Neurological: Positive for weakness. Negative for  dizziness, numbness and headaches.  Psychiatric/Behavioral: The patient is not nervous/anxious.     Objective:  BP 126/68 mmHg  Pulse 106  Temp(Src) 97.5 F (36.4 C)  Resp 14  Ht 5' 1.5" (1.562 m)  Wt 109 lb 12.8 oz (49.805 kg)  BMI 20.41 kg/m2  SpO2 95%  Physical Exam  Constitutional: She is oriented to person, place, and time. She appears well-developed and well-nourished. No distress.  HENT:  Head: Normocephalic and atraumatic.  Right Ear: External ear normal.  Left Ear: External ear normal.  Cardiovascular:  Normal rate, regular rhythm and normal heart sounds.   Pulmonary/Chest: Effort normal and breath sounds normal. No respiratory distress. She has no wheezes. She has no rales. She exhibits no tenderness.  Musculoskeletal: Normal range of motion. She exhibits no edema or tenderness.  Cane with ambulation  Neurological: She is alert and oriented to person, place, and time. No cranial nerve deficit. She exhibits normal muscle tone. Coordination normal.  Strength in iliopsoas and quadriceps bilaterally 5/5, strength in deltoid and biceps bilaterally 5/5  Skin: Skin is warm and dry. No rash noted. She is not diaphoretic.  Psychiatric: She has a normal mood and affect. Her behavior is normal. Judgment and thought content normal.   Assessment & Plan:   [name] was seen today for fatigue.  Diagnoses and all orders for this visit:  Weakness generalized -     CBC with Differential/Platelet -     B12 -     Ambulatory referral to Physical Therapy  Osteoporosis -     Ambulatory referral to Physical Therapy  Other orders -     ondansetron (ZOFRAN ODT) 4 MG disintegrating tablet; Take 1 tablet (4 mg total) by mouth every 8 (eight) hours as needed for nausea or vomiting.  I am having [name] maintain her multivitamin, Magnesium, cholecalciferol, lisinopril, Calcium-Vitamin A-Vitamin D (LIQUID CALCIUM PO), oxyCODONE-acetaminophen, tiZANidine, ibuprofen, etodolac, denosumab, etodolac, traMADol, and ondansetron.  Meds ordered this encounter  Medications  . ondansetron (ZOFRAN ODT) 4 MG disintegrating tablet    Sig: Take 1 tablet (4 mg total) by mouth every 8 (eight) hours as needed for nausea or vomiting.    Dispense:  20 tablet    Refill:  0    Order Specific Question:  Supervising Provider    Answer:  Crecencio Mc [[id number]]     Follow-up: Return in about 4 weeks (around 11/14/2014) for Weakness.

## 2014-10-17 NOTE — Progress Notes (Signed)
Pre visit review using our clinic review tool, if applicable. No additional management support is needed unless otherwise documented below in the visit note. 

## 2014-10-19 ENCOUNTER — Encounter: Payer: Self-pay | Admitting: Nurse Practitioner

## 2014-10-19 ENCOUNTER — Telehealth: Payer: Self-pay | Admitting: Nurse Practitioner

## 2014-10-19 ENCOUNTER — Telehealth: Payer: Self-pay | Admitting: *Deleted

## 2014-10-19 ENCOUNTER — Ambulatory Visit: Payer: PPO

## 2014-10-19 NOTE — Telephone Encounter (Signed)
Pt is concerned about Prolia being the culprit for her weakness, SOB, and leg pain. Agreed with her that this could be cause. Pt reports these symptoms started in the 5th month after Prolia. I will do some research on this and possible alternatives.

## 2014-10-19 NOTE — Telephone Encounter (Signed)
Patient canceled her appt today at 11am  for an injection. Patient wanted to speak to Lorane Gell ,NP before she have the injection. Thanks

## 2014-10-20 ENCOUNTER — Other Ambulatory Visit: Payer: Self-pay | Admitting: Nurse Practitioner

## 2014-10-20 ENCOUNTER — Telehealth: Payer: Self-pay | Admitting: Nurse Practitioner

## 2014-10-20 ENCOUNTER — Encounter: Payer: Self-pay | Admitting: Nurse Practitioner

## 2014-10-20 DIAGNOSIS — R0602 Shortness of breath: Secondary | ICD-10-CM

## 2014-10-20 MED ORDER — CLONAZEPAM 0.5 MG PO TABS: 0.5000 mg | ORAL_TABLET | Freq: Every day | ORAL | Status: DC

## 2014-10-20 NOTE — Telephone Encounter (Signed)
Error

## 2014-10-23 ENCOUNTER — Encounter: Payer: Self-pay | Admitting: Nurse Practitioner

## 2014-10-23 NOTE — Assessment & Plan Note (Signed)
Prolia will be held until we examine reasons for fatigue and generalized weakness. Will obtain CBC w/ diff and B12.

## 2014-10-23 NOTE — Assessment & Plan Note (Signed)
Weakness seems to be perceived by pt. Probably in concert with fatigue and DOE. Pt has severe osteoporosis and has received 1 prolia injection in Feb. 2016. She is due for another one this week, but would like to hold off. Will see if she can attend physical therapy with [facility] for another round for general strengthening and pain management.

## 2014-10-24 ENCOUNTER — Ambulatory Visit (INDEPENDENT_AMBULATORY_CARE_PROVIDER_SITE_OTHER): Payer: PPO

## 2014-10-24 ENCOUNTER — Encounter: Payer: Self-pay | Admitting: Nurse Practitioner

## 2014-10-24 DIAGNOSIS — M810 Age-related osteoporosis without current pathological fracture: Secondary | ICD-10-CM

## 2014-10-24 MED ORDER — DENOSUMAB 60 MG/ML ~~LOC~~ SOLN
60.0000 mg | Freq: Once | SUBCUTANEOUS | Status: AC
  Administered 2014-10-24: 60 mg via SUBCUTANEOUS

## 2014-10-24 NOTE — Progress Notes (Signed)
Patient came in for Prolia injection.  Received injection and tolerated well.

## 2014-10-25 ENCOUNTER — Emergency Department: Payer: PPO

## 2014-10-25 ENCOUNTER — Encounter: Payer: Self-pay | Admitting: Emergency Medicine

## 2014-10-25 ENCOUNTER — Inpatient Hospital Stay (HOSPITAL_COMMUNITY)
Admit: 2014-10-25 | Discharge: 2014-10-25 | Disposition: A | Discharge status: Home / Self Care | Payer: PPO | Attending: Internal Medicine | Admitting: Internal Medicine

## 2014-10-25 ENCOUNTER — Inpatient Hospital Stay
Admission: EM | Admit: 2014-10-25 | Discharge: 2014-10-28 | DRG: 246 | Disposition: A | Discharge status: Home / Self Care | Payer: PPO | Attending: Internal Medicine | Admitting: Internal Medicine

## 2014-10-25 DIAGNOSIS — M810 Age-related osteoporosis without current pathological fracture: Secondary | ICD-10-CM | POA: Diagnosis present

## 2014-10-25 DIAGNOSIS — R0602 Shortness of breath: Secondary | ICD-10-CM | POA: Diagnosis present

## 2014-10-25 DIAGNOSIS — I340 Nonrheumatic mitral (valve) insufficiency: Secondary | ICD-10-CM | POA: Diagnosis present

## 2014-10-25 DIAGNOSIS — J9601 Acute respiratory failure with hypoxia: Secondary | ICD-10-CM | POA: Diagnosis not present

## 2014-10-25 DIAGNOSIS — F419 Anxiety disorder, unspecified: Secondary | ICD-10-CM | POA: Diagnosis present

## 2014-10-25 DIAGNOSIS — I5021 Acute systolic (congestive) heart failure: Secondary | ICD-10-CM

## 2014-10-25 DIAGNOSIS — I420 Dilated cardiomyopathy: Secondary | ICD-10-CM | POA: Diagnosis not present

## 2014-10-25 DIAGNOSIS — Z8249 Family history of ischemic heart disease and other diseases of the circulatory system: Secondary | ICD-10-CM | POA: Diagnosis not present

## 2014-10-25 DIAGNOSIS — I255 Ischemic cardiomyopathy: Secondary | ICD-10-CM | POA: Diagnosis present

## 2014-10-25 DIAGNOSIS — Z955 Presence of coronary angioplasty implant and graft: Secondary | ICD-10-CM | POA: Diagnosis not present

## 2014-10-25 DIAGNOSIS — N289 Disorder of kidney and ureter, unspecified: Secondary | ICD-10-CM | POA: Diagnosis present

## 2014-10-25 DIAGNOSIS — Z87891 Personal history of nicotine dependence: Secondary | ICD-10-CM

## 2014-10-25 DIAGNOSIS — Z9102 Food additives allergy status: Secondary | ICD-10-CM | POA: Diagnosis not present

## 2014-10-25 DIAGNOSIS — Z833 Family history of diabetes mellitus: Secondary | ICD-10-CM

## 2014-10-25 DIAGNOSIS — T508X5A Adverse effect of diagnostic agents, initial encounter: Secondary | ICD-10-CM | POA: Diagnosis present

## 2014-10-25 DIAGNOSIS — B1920 Unspecified viral hepatitis C without hepatic coma: Secondary | ICD-10-CM | POA: Diagnosis present

## 2014-10-25 DIAGNOSIS — I501 Left ventricular failure: Secondary | ICD-10-CM

## 2014-10-25 DIAGNOSIS — E875 Hyperkalemia: Secondary | ICD-10-CM | POA: Diagnosis present

## 2014-10-25 DIAGNOSIS — M1990 Unspecified osteoarthritis, unspecified site: Secondary | ICD-10-CM | POA: Diagnosis present

## 2014-10-25 DIAGNOSIS — G8929 Other chronic pain: Secondary | ICD-10-CM | POA: Diagnosis present

## 2014-10-25 DIAGNOSIS — Z8261 Family history of arthritis: Secondary | ICD-10-CM

## 2014-10-25 DIAGNOSIS — I25119 Atherosclerotic heart disease of native coronary artery with unspecified angina pectoris: Secondary | ICD-10-CM | POA: Diagnosis present

## 2014-10-25 DIAGNOSIS — J810 Acute pulmonary edema: Secondary | ICD-10-CM | POA: Diagnosis not present

## 2014-10-25 DIAGNOSIS — I951 Orthostatic hypotension: Secondary | ICD-10-CM | POA: Diagnosis present

## 2014-10-25 DIAGNOSIS — I5023 Acute on chronic systolic (congestive) heart failure: Secondary | ICD-10-CM | POA: Diagnosis present

## 2014-10-25 DIAGNOSIS — I10 Essential (primary) hypertension: Secondary | ICD-10-CM | POA: Diagnosis present

## 2014-10-25 HISTORY — DX: Chronic systolic (congestive) heart failure: I50.22

## 2014-10-25 LAB — BLOOD GAS, ARTERIAL
ALLENS TEST (PASS/FAIL): POSITIVE — AB
Acid-base deficit: 1.6 mmol/L (ref 0.0–2.0)
BICARBONATE: 21.7 meq/L (ref 21.0–28.0)
FIO2: 0.21
O2 Saturation: 91.2 %
PATIENT TEMPERATURE: 37
PH ART: 7.44 (ref 7.350–7.450)
pCO2 arterial: 32 mmHg (ref 32.0–48.0)
pO2, Arterial: 59 mmHg — ABNORMAL LOW (ref 83.0–108.0)

## 2014-10-25 LAB — COMPREHENSIVE METABOLIC PANEL
ALT: 29 U/L (ref 14–54)
AST: 36 U/L (ref 15–41)
Albumin: 3.7 g/dL (ref 3.5–5.0)
Alkaline Phosphatase: 59 U/L (ref 38–126)
Anion gap: 7 (ref 5–15)
BILIRUBIN TOTAL: 0.5 mg/dL (ref 0.3–1.2)
BUN: 21 mg/dL — AB (ref 6–20)
CHLORIDE: 109 mmol/L (ref 101–111)
CO2: 22 mmol/L (ref 22–32)
CREATININE: 0.98 mg/dL (ref 0.44–1.00)
Calcium: 9 mg/dL (ref 8.9–10.3)
GFR, EST NON AFRICAN AMERICAN: 56 mL/min — AB (ref >60)
Glucose, Bld: 98 mg/dL (ref 65–99)
POTASSIUM: 5.4 mmol/L — AB (ref 3.5–5.1)
Sodium: 138 mmol/L (ref 135–145)
TOTAL PROTEIN: 6.4 g/dL — AB (ref 6.5–8.1)

## 2014-10-25 LAB — CBC WITH DIFFERENTIAL/PLATELET
BASOS ABS: 0 10*3/uL (ref 0–0.1)
Basophils Relative: 1 %
EOS ABS: 0.1 10*3/uL (ref 0–0.7)
EOS PCT: 1 %
HCT: 42.8 % (ref 35.0–47.0)
HEMOGLOBIN: 13.8 g/dL (ref 12.0–16.0)
LYMPHS ABS: 1.4 10*3/uL (ref 1.0–3.6)
Lymphocytes Relative: 17 %
MCH: 29.6 pg (ref 26.0–34.0)
MCHC: 32.3 g/dL (ref 32.0–36.0)
MCV: 91.8 fL (ref 80.0–100.0)
Monocytes Absolute: 0.3 10*3/uL (ref 0.2–0.9)
Monocytes Relative: 4 %
NEUTROS PCT: 77 %
Neutro Abs: 6.1 10*3/uL (ref 1.4–6.5)
PLATELETS: 253 10*3/uL (ref 150–440)
RBC: 4.66 MIL/uL (ref 3.80–5.20)
RDW: 14.5 % (ref 11.5–14.5)
WBC: 7.9 10*3/uL (ref 3.6–11.0)

## 2014-10-25 LAB — BRAIN NATRIURETIC PEPTIDE: B NATRIURETIC PEPTIDE 5: 3423 pg/mL — AB (ref 0.0–100.0)

## 2014-10-25 LAB — TROPONIN I: TROPONIN I: 0.05 ng/mL — AB (ref <0.031)

## 2014-10-25 MED ORDER — TRAMADOL HCL 50 MG PO TABS
50.0000 mg | ORAL_TABLET | Freq: Four times a day (QID) | ORAL | Status: DC | PRN
  Administered 2014-10-27: 50 mg via ORAL
  Filled 2014-10-25: qty 1

## 2014-10-25 MED ORDER — CHLORHEXIDINE GLUCONATE 0.12 % MT SOLN
15.0000 mL | Freq: Two times a day (BID) | OROMUCOSAL | Status: DC
  Administered 2014-10-25 – 2014-10-26 (×2): 15 mL via OROMUCOSAL
  Filled 2014-10-25 (×2): qty 15

## 2014-10-25 MED ORDER — NITROGLYCERIN 2 % TD OINT
0.5000 [in_us] | TOPICAL_OINTMENT | Freq: Four times a day (QID) | TRANSDERMAL | Status: DC
  Administered 2014-10-25 – 2014-10-26 (×2): 0.5 [in_us] via TOPICAL
  Administered 2014-10-27: 1 [in_us] via TOPICAL
  Administered 2014-10-27: 0.5 [in_us] via TOPICAL
  Filled 2014-10-25 (×4): qty 1

## 2014-10-25 MED ORDER — CLONAZEPAM 0.5 MG PO TABS
0.5000 mg | ORAL_TABLET | Freq: Every day | ORAL | Status: DC
  Administered 2014-10-25 – 2014-10-27 (×2): 0.5 mg via ORAL
  Filled 2014-10-25 (×3): qty 1

## 2014-10-25 MED ORDER — TIZANIDINE HCL 4 MG PO TABS
4.0000 mg | ORAL_TABLET | Freq: Three times a day (TID) | ORAL | Status: DC
  Administered 2014-10-25 – 2014-10-26 (×2): 4 mg via ORAL
  Administered 2014-10-26: 2 mg via ORAL
  Administered 2014-10-27 (×2): 4 mg via ORAL
  Filled 2014-10-25 (×6): qty 1

## 2014-10-25 MED ORDER — OXYCODONE HCL 5 MG PO TABS: 5.0000 mg | ORAL_TABLET | Freq: Four times a day (QID) | ORAL | Status: DC | PRN

## 2014-10-25 MED ORDER — CETYLPYRIDINIUM CHLORIDE 0.05 % MT LIQD: 7.0000 mL | Freq: Two times a day (BID) | OROMUCOSAL | Status: DC

## 2014-10-25 MED ORDER — SODIUM CHLORIDE 0.9 % IJ SOLN
3.0000 mL | Freq: Two times a day (BID) | INTRAMUSCULAR | Status: DC
  Administered 2014-10-25 – 2014-10-26 (×2): 3 mL via INTRAVENOUS

## 2014-10-25 MED ORDER — ENOXAPARIN SODIUM 40 MG/0.4ML ~~LOC~~ SOLN
40.0000 mg | SUBCUTANEOUS | Status: DC
  Administered 2014-10-27: 40 mg via SUBCUTANEOUS
  Filled 2014-10-25: qty 0.4

## 2014-10-25 MED ORDER — FUROSEMIDE 10 MG/ML IJ SOLN
20.0000 mg | Freq: Two times a day (BID) | INTRAMUSCULAR | Status: DC
  Administered 2014-10-25 – 2014-10-27 (×4): 20 mg via INTRAVENOUS
  Filled 2014-10-25: qty 4
  Filled 2014-10-25 (×3): qty 2

## 2014-10-25 MED ORDER — SENNA 8.6 MG PO TABS
1.0000 | ORAL_TABLET | Freq: Two times a day (BID) | ORAL | Status: DC
  Administered 2014-10-28: 8.6 mg via ORAL
  Filled 2014-10-25 (×4): qty 1

## 2014-10-25 MED ORDER — MAGNESIUM OXIDE 400 (241.3 MG) MG PO TABS
400.0000 mg | ORAL_TABLET | Freq: Every day | ORAL | Status: DC
  Administered 2014-10-28: 400 mg via ORAL
  Filled 2014-10-25 (×2): qty 1

## 2014-10-25 MED ORDER — DENOSUMAB 60 MG/ML ~~LOC~~ SOLN: 60.0000 mg | SUBCUTANEOUS | Status: DC

## 2014-10-25 MED ORDER — OXYCODONE-ACETAMINOPHEN 10-325 MG PO TABS: 1.0000 | ORAL_TABLET | Freq: Four times a day (QID) | ORAL | Status: DC | PRN

## 2014-10-25 MED ORDER — DOCUSATE SODIUM 100 MG PO CAPS
100.0000 mg | ORAL_CAPSULE | Freq: Two times a day (BID) | ORAL | Status: DC
  Administered 2014-10-28: 100 mg via ORAL
  Filled 2014-10-25 (×4): qty 1

## 2014-10-25 MED ORDER — ETODOLAC ER 500 MG PO TB24: 500.0000 mg | ORAL_TABLET | Freq: Every day | ORAL | Status: DC

## 2014-10-25 MED ORDER — ETODOLAC 500 MG PO TABS
500.0000 mg | ORAL_TABLET | Freq: Two times a day (BID) | ORAL | Status: DC
  Filled 2014-10-25 (×5): qty 1

## 2014-10-25 MED ORDER — IBUPROFEN 400 MG PO TABS
800.0000 mg | ORAL_TABLET | Freq: Two times a day (BID) | ORAL | Status: DC
  Administered 2014-10-26 (×2): 800 mg via ORAL
  Filled 2014-10-25 (×2): qty 4

## 2014-10-25 MED ORDER — ONDANSETRON 4 MG PO TBDP
4.0000 mg | ORAL_TABLET | Freq: Three times a day (TID) | ORAL | Status: DC | PRN
  Administered 2014-10-25: 4 mg via ORAL
  Filled 2014-10-25 (×3): qty 1

## 2014-10-25 MED ORDER — MORPHINE SULFATE (PF) 2 MG/ML IV SOLN: 2.0000 mg | INTRAVENOUS | Status: DC | PRN

## 2014-10-25 MED ORDER — ASPIRIN EC 325 MG PO TBEC
325.0000 mg | DELAYED_RELEASE_TABLET | Freq: Every day | ORAL | Status: DC
  Filled 2014-10-25 (×2): qty 1

## 2014-10-25 MED ORDER — METOPROLOL TARTRATE 25 MG PO TABS
25.0000 mg | ORAL_TABLET | Freq: Two times a day (BID) | ORAL | Status: DC
  Administered 2014-10-25 (×2): 25 mg via ORAL
  Filled 2014-10-25 (×3): qty 1

## 2014-10-25 MED ORDER — LISINOPRIL 10 MG PO TABS
10.0000 mg | ORAL_TABLET | Freq: Every day | ORAL | Status: DC
  Filled 2014-10-25 (×2): qty 1

## 2014-10-25 MED ORDER — OXYCODONE-ACETAMINOPHEN 5-325 MG PO TABS: 1.0000 | ORAL_TABLET | Freq: Four times a day (QID) | ORAL | Status: DC | PRN

## 2014-10-25 NOTE — ED Notes (Signed)
Reports weakness in legs and sob

## 2014-10-25 NOTE — Progress Notes (Signed)
*  PRELIMINARY RESULTS* Echocardiogram 2D Echocardiogram has been performed.  [name] 10/25/2014, 6:05 PM

## 2014-10-25 NOTE — H&P (Addendum)
[facility] [place] NAME: Keiara Sylve    MR#:  [id number]  DATE OF BIRTH:  November 16, 1940  DATE OF ADMISSION:  10/25/2014  PRIMARY CARE PHYSICIAN: Rubbie Battiest, NP   REQUESTING/REFERRING PHYSICIAN:   CHIEF COMPLAINT:   Chief Complaint  Patient presents with  . Weakness    HISTORY OF PRESENT ILLNESS: Ken Otoole  is a 74 y.o. female with a known history of osteoporosis and back pain since injury in vertebra presents to the hospital was 3-4 weeks or longer history of gradually worsening shortness of breath and dyspnea on exertion as well as increasing weakness.  She's been coughing mostly after she eats. She also admitted of weight loss of approximately 1 pound over this period of time. She does snor but denies any daytime sleepiness. She has been noticing lower extremity swelling over the past 6-8 weeks. She presented to the hospital for further evaluation where she was noted to be hypoxic with  PO2 level of 59, saturations of 89%, her chest x-ray revealed cardiomegaly and pulmonary vascular congestion, CHF. Hospitalist services were contacted for admission.   PAST MEDICAL HISTORY:   Past Medical History  Diagnosis Date  . UTI (lower urinary tract infection)   . Arthritis   . Osteoporosis   . Hepatitis C     PAST SURGICAL HISTORY:  Past Surgical History  Procedure Laterality Date  . Back surgery  07/14    T10, L1    SOCIAL HISTORY:  Social History  Substance Use Topics  . Smoking status: Former Smoker -- 1.00 packs/day for 10 years  . Smokeless tobacco: Never Used  . Alcohol Use: No    FAMILY HISTORY:  Family History  Problem Relation Age of Onset  . Arthritis Mother   . Heart disease Father   . Diabetes Father     DRUG ALLERGIES:  Allergies  Allergen Reactions  . Sugar-Protein-Starch Nausea And Vomiting    Only to sugar    Review of Systems  Constitutional: Positive for weight loss and malaise/fatigue.  Negative for fever and chills.  HENT: Negative for congestion.   Eyes: Negative for blurred vision and double vision.  Respiratory: Positive for cough and shortness of breath. Negative for sputum production and wheezing.   Cardiovascular: Positive for orthopnea and leg swelling. Negative for chest pain, palpitations and PND.  Gastrointestinal: Positive for constipation and blood in stool. Negative for nausea, vomiting, abdominal pain, diarrhea and melena.  Genitourinary: Negative for dysuria, urgency, frequency and hematuria.  Musculoskeletal: Positive for back pain. Negative for falls.  Skin: Negative for rash.  Neurological: Positive for weakness. Negative for dizziness.  Psychiatric/Behavioral: Negative for depression and memory loss. The patient is not nervous/anxious.     MEDICATIONS AT HOME:  Prior to Admission medications   Medication Sig Start Date End Date Taking? Authorizing Provider  Calcium-Vitamin A-Vitamin D (LIQUID CALCIUM PO) Take by mouth. 1 tablespoon daily   Yes Historical Provider, MD  Chlorpheniramine Maleate (ALLERGY PO) Take 1 tablet by mouth daily.   Yes Historical Provider, MD  cholecalciferol (VITAMIN D) 1000 UNITS tablet Take 1,000 Units by mouth daily.   Yes Historical Provider, MD  Multiple Vitamin (MULTIVITAMIN) tablet Take 1 tablet by mouth daily.   Yes Historical Provider, MD  traMADol (ULTRAM) 50 MG tablet Take 25 mg by mouth every 6 (six) hours as needed.   Yes Historical Provider, MD  clonazePAM (KLONOPIN) 0.5 MG tablet Take 1 tablet (0.5 mg total)  by mouth at bedtime. 10/20/14   Rubbie Battiest, NP  denosumab (PROLIA) 60 MG/ML SOLN injection Inject 60 mg into the skin every 6 (six) months. 04/04/14   Rubbie Battiest, NP  etodolac (LODINE XL) 500 MG 24 hr tablet Take 1 tablet (500 mg total) by mouth daily. 03/22/14   Rubbie Battiest, NP  etodolac (LODINE) 500 MG tablet TAKE 1 TABLET BY MOUTH TWICE A DAY 04/18/14   Rubbie Battiest, NP  ibuprofen (ADVIL,MOTRIN) 800 MG  tablet Take 1 tablet (800 mg total) by mouth 2 (two) times daily. 02/22/14   Rubbie Battiest, NP  lisinopril (PRINIVIL,ZESTRIL) 10 MG tablet Take 1 tablet (10 mg total) by mouth daily. 11/17/13   Raquel Dagoberto Ligas, NP  Magnesium 250 MG TABS Take 1 tablet by mouth daily.    Historical Provider, MD  ondansetron (ZOFRAN ODT) 4 MG disintegrating tablet Take 1 tablet (4 mg total) by mouth every 8 (eight) hours as needed for nausea or vomiting. 10/17/14   Rubbie Battiest, NP  oxyCODONE-acetaminophen (PERCOCET) 10-325 MG per tablet Take 1 tablet by mouth every 6 (six) hours as needed for pain. 02/22/14   Rubbie Battiest, NP  tiZANidine (ZANAFLEX) 4 MG tablet Take 1 tablet (4 mg total) by mouth 3 (three) times daily. 02/22/14   Rubbie Battiest, NP  traMADol (ULTRAM) 50 MG tablet TAKE 1 TABLET BY MOUTH 3 TIMES A DAY AS NEEDED FOR PAIN 09/26/14   Rubbie Battiest, NP      PHYSICAL EXAMINATION:   VITAL SIGNS: Blood pressure 123/80, pulse 107, temperature 98.1 F (36.7 C), temperature source Oral, resp. rate 26, height 5\' 1"  (1.549 m), weight 49.442 kg (109 lb), SpO2 89 %.  GENERAL:  74 y.o.-year-old patient lying in the bed with no acute distress.  EYES: Pupils equal, round, reactive to light and accommodation. No scleral icterus. Extraocular muscles intact.  HEENT: Head atraumatic, normocephalic. Oropharynx and nasopharynx clear.  NECK:  Supple, has 10 cm jugular venous distention. No thyroid enlargement, no tenderness.  LUNGS: Diminished breath sounds bilaterally in the lower parts of lungs, crepitations were heard it to half of lung fields bilaterally, more on the left side, no wheezing, rales,rhonchi . Intermittent use of accessory muscles of respiration, especially on exertion.  CARDIOVASCULAR: S1, S2 normal. No murmurs, rubs, or gallops. Tachycardic. Jugular vein distention ABDOMEN: Soft, nontender, nondistended. Bowel sounds present. No organomegaly or mass.  EXTREMITIES: 1-2+ calve and pedal edema, no cyanosis, or  clubbing.  NEUROLOGIC: Cranial nerves II through XII are intact. Muscle strength 5/5 in all extremities. Sensation intact. Gait not checked.  PSYCHIATRIC: The patient is alert and oriented x 3.  SKIN: No obvious rash, lesion, or ulcer.   LABORATORY PANEL:   CBC  Recent Labs Lab 10/25/14 1316  WBC 7.9  HGB 13.8  HCT 42.8  PLT 253  MCV 91.8  MCH 29.6  MCHC 32.3  RDW 14.5  LYMPHSABS 1.4  MONOABS 0.3  EOSABS 0.1  BASOSABS 0.0   ------------------------------------------------------------------------------------------------------------------  Chemistries   Recent Labs Lab 10/25/14 1013  NA 138  K 5.4*  CL 109  CO2 22  GLUCOSE 98  BUN 21*  CREATININE 0.98  CALCIUM 9.0  AST 36  ALT 29  ALKPHOS 59  BILITOT 0.5   ------------------------------------------------------------------------------------------------------------------  Cardiac Enzymes  Recent Labs Lab 10/25/14 1013  TROPONINI 0.05*   ------------------------------------------------------------------------------------------------------------------  RADIOLOGY: Dg Chest 2 View  10/25/2014   CLINICAL DATA:  Shortness of breath.  EXAM:  CHEST  2 VIEW  COMPARISON:  MRI 02/13/2014 01/02/2014.  FINDINGS: Mediastinum and hilar structures normal. Cardiomegaly mild pulmonary vascular prominence and bilateral interstitial prominence with bilateral pleural effusions. Findings consistent congestive heart failure. Basilar atelectasis and/or pneumonia. No acute bony abnormality. Degenerative changes thoracic spine. Prior thoracic and lumbar vertebroplasties. Mid thoracic spine compression fractures, age undetermined. Stable lumbar compression fractures.  IMPRESSION: 1. Cardiomegaly with mild pulmonary vascular prominence interstitial prominence with bile pleural effusions. Findings consistent congestive heart failure. 2. Bibasilar atelectasis and/or infiltrates.   Electronically Signed   By: Marcello Moores  Register   On: 10/25/2014  10:09    EKG: Orders placed or performed during the hospital encounter of 10/25/14  . ED EKG  . ED EKG   EKG revealed a sinus tachycardia at rate of 10 9 bpm, normal axis, left bundle branch block, nonspecific ST-T changes, no change since prior EKG  IMPRESSION AND PLAN:  Principal Problem:   Acute respiratory failure with hypoxemia Active Problems:   Acute pulmonary edema with congestive heart failure   CHF (congestive heart failure) 1, acute respiratory failure with hypoxia due to acute congestive heart failure, continue oxygen therapy, keeping pulse oximeter is around 94%. Weaning down as tolerated 2. Acute pulmonary edema with congestive heart failure, IV Lasix, nitroglycerin topically, following in's and outs as well as weight and clinically 3. Acute congestive heart failure. Get echocardiogram read by [facility], continue ACE inhibitor for hypertension 4. Elevated troponin, rule out coronary artery disease. Get cardiology consultation as well as echocardiogram. Initiate patient on aspirin, metoprolol, low-dose,  Nitroglycerin topically and Lovenox 5. Hyperkalemia. Follow with Lasix therapy  All the records are reviewed and case discussed with ED provider. Management plans discussed with the patient, family and they are in agreement.  CODE STATUS:    TOTAL TIME TAKING CARE OF THIS PATIENT: 50 minutes.    Theodoro Grist M.D on 10/25/2014 at 2:36 PM  Between 7am to 6pm - Pager - [phone] After 6pm go to [email or web address] - password [email or web address] [facility]  Office  [phone]  CC: Primary care physician; Rubbie Battiest, NP

## 2014-10-25 NOTE — ED Notes (Signed)
Error in VS

## 2014-10-25 NOTE — ED Provider Notes (Signed)
[facility] Emergency Department Provider Note     Time seen: ----------------------------------------- 9:51 AM on 10/25/2014 -----------------------------------------    I have reviewed the triage vital signs and the nursing notes.   HISTORY  Chief Complaint Weakness    HPI CARLEY ABELE is a 74 y.o. female who presents ER with weakness and shortness of breath that has progressively worsened over the last several weeks. Patient states she is both too weak and too short of breath to walk on a treadmill for more than several minutes. Patient states she sleeps almost sitting erect in a lift chair. Patient states she gets more short of breath when she lays flat, has had a cough. Describes poor appetite, denies any fevers or chills. Patient denies any chest pain, vomiting or diarrhea.   Past Medical History  Diagnosis Date  . UTI (lower urinary tract infection)   . Arthritis   . Osteoporosis   . Hepatitis C     Patient Active Problem List   Diagnosis Date Noted  . Weakness generalized 10/23/2014  . S/P repair of vertebral fracture 02/22/2014  . Nausea with vomiting 02/04/2014  . Hyperkalemia 02/04/2014  . History of hepatitis C 11/17/2013  . Essential hypertension 11/17/2013  . Screening for osteoporosis 11/17/2013  . Osteoporosis 11/19/2012  . HTN (hypertension) 11/19/2012  . Routine general medical examination at a health care facility 11/19/2012    Past Surgical History  Procedure Laterality Date  . Back surgery  07/14    T10, L1    Allergies Sugar-protein-starch  Social History Social History  Substance Use Topics  . Smoking status: Former Smoker -- 1.00 packs/day for 10 years  . Smokeless tobacco: Never Used  . Alcohol Use: No    Review of Systems Constitutional: Negative for fever. Eyes: Negative for visual changes. ENT: Negative for sore throat. Cardiovascular: Negative for chest pain. Respiratory: Positive for shortness  of breath Gastrointestinal: Negative for abdominal pain, vomiting and diarrhea. Genitourinary: Negative for dysuria. Musculoskeletal: Negative for back pain. Skin: Negative for rash. Neurological: Negative for headaches, positive for generalized weakness  10-point ROS otherwise negative.  ____________________________________________   PHYSICAL EXAM:  VITAL SIGNS: ED Triage Vitals  Enc Vitals Group     BP 10/25/14 0928 146/72 mmHg     Pulse Rate 10/25/14 0928 107     Resp 10/25/14 0928 22     Temp 10/25/14 0928 97.7 F (36.5 C)     Temp Source 10/25/14 0928 Oral     SpO2 10/25/14 0928 98 %     Weight 10/25/14 0928 109 lb (49.442 kg)     Height 10/25/14 0928 5\' 1"  (1.549 m)     Head Cir --      Peak Flow --      Pain Score --      Pain Loc --      Pain Edu? --      Excl. in [place]? --     Constitutional: Alert and oriented. Well appearing and in no distress. Eyes: Conjunctivae are normal. PERRL. Normal extraocular movements. ENT   Head: Normocephalic and atraumatic.   Nose: No congestion/rhinnorhea.   Mouth/Throat: Mucous membranes are moist.   Neck: No stridor. Cardiovascular: Normal rate, regular rhythm. Normal and symmetric distal pulses are present in all extremities. No murmurs, rubs, or gallops. Respiratory: Normal respiratory effort without tachypnea nor retractions. Breath sounds are clear and equal bilaterally. No wheezes/rales/rhonchi. Gastrointestinal: Soft and nontender. No distention. No abdominal bruits.  Musculoskeletal: Nontender with  normal range of motion in all extremities. No joint effusions.  No lower extremity tenderness, mild edema in her feet Neurologic:  Normal speech and language. Generalized weakness, nothing focal Speech is normal. No gait instability. Skin:  Skin is warm, dry and intact. No rash noted. Psychiatric: Mood and affect are normal. Speech and behavior are normal. Patient exhibits appropriate insight and  judgment. ____________________________________________  EKG: Interpreted by me. Sinus tachycardia with a rate of 109 bpm, left bundle branch block, normal axis.   ____________________________________________  ED COURSE:  Pertinent labs & imaging results that were available during my care of the patient were reviewed by me and considered in my medical decision making (see chart for details). Patient with subacute weakness and shortness of breath unclear etiology. We'll need extensive lab work and reevaluation ____________________________________________    LABS (pertinent positives/negatives)  Labs Reviewed  BRAIN NATRIURETIC PEPTIDE - Abnormal; Notable for the following:    B Natriuretic Peptide 3423.0 (*)    All other components within normal limits  COMPREHENSIVE METABOLIC PANEL - Abnormal; Notable for the following:    Potassium 5.4 (*)    BUN 21 (*)    Total Protein 6.4 (*)    GFR calc non Af Amer 56 (*)    All other components within normal limits  TROPONIN I - Abnormal; Notable for the following:    Troponin I 0.05 (*)    All other components within normal limits  BLOOD GAS, ARTERIAL - Abnormal; Notable for the following:    pO2, Arterial 59 (*)    Allens test (pass/fail) POSITIVE (*)    All other components within normal limits  CBC WITH DIFFERENTIAL/PLATELET  CBC WITH DIFFERENTIAL/PLATELET  CBC WITH DIFFERENTIAL/PLATELET    RADIOLOGY Images were viewed by me  Chest x-ray IMPRESSION: 1. Cardiomegaly with mild pulmonary vascular prominence interstitial prominence with bile pleural effusions. Findings consistent congestive heart failure. 2. Bibasilar atelectasis and/or infiltrates. ____________________________________________  FINAL ASSESSMENT AND PLAN  Weakness, shortness of breath, congestive heart failure, elevated troponin  Plan: Patient with labs and imaging as dictated above. Patient with new onset CHF. Will need echocardiogram and cardiology  consultation. Patient is reluctant to be admitted in  the hospital.    Earleen Newport, MD   Earleen Newport, MD 10/25/14 [phone]

## 2014-10-25 NOTE — Progress Notes (Signed)
Patient alert and oriented, no complaints of pain. VSS, STach on monitor and patient on [facility] acutely.  Admission done.  Patient refusing to allow lab to draw blood and get her second troponin.  They will try again in a hour.

## 2014-10-25 NOTE — Progress Notes (Signed)
Pt refused lab work as well as taking her meds. MD Ether Griffins was notified. No further orders.

## 2014-10-25 NOTE — ED Notes (Signed)
Patient transported to X-ray 

## 2014-10-26 ENCOUNTER — Other Ambulatory Visit: Payer: Self-pay | Admitting: Cardiovascular Disease

## 2014-10-26 ENCOUNTER — Encounter: Payer: Self-pay | Admitting: Physician Assistant

## 2014-10-26 DIAGNOSIS — I5021 Acute systolic (congestive) heart failure: Secondary | ICD-10-CM

## 2014-10-26 DIAGNOSIS — I420 Dilated cardiomyopathy: Secondary | ICD-10-CM

## 2014-10-26 DIAGNOSIS — J9601 Acute respiratory failure with hypoxia: Secondary | ICD-10-CM

## 2014-10-26 DIAGNOSIS — J810 Acute pulmonary edema: Secondary | ICD-10-CM

## 2014-10-26 LAB — BASIC METABOLIC PANEL
ANION GAP: 8 (ref 5–15)
ANION GAP: 9 (ref 5–15)
BUN: 22 mg/dL — AB (ref 6–20)
BUN: 23 mg/dL — AB (ref 6–20)
CHLORIDE: 107 mmol/L (ref 101–111)
CO2: 27 mmol/L (ref 22–32)
CO2: 28 mmol/L (ref 22–32)
Calcium: 9.3 mg/dL (ref 8.9–10.3)
Calcium: 9 mg/dL (ref 8.9–10.3)
Chloride: 101 mmol/L (ref 101–111)
Creatinine, Ser: 1.1 mg/dL — ABNORMAL HIGH (ref 0.44–1.00)
Creatinine, Ser: 1.23 mg/dL — ABNORMAL HIGH (ref 0.44–1.00)
GFR calc Af Amer: 56 mL/min — ABNORMAL LOW (ref >60)
GFR calc Af Amer: 49 mL/min — ABNORMAL LOW (ref >60)
GFR calc non Af Amer: 42 mL/min — ABNORMAL LOW (ref >60)
GFR, EST NON AFRICAN AMERICAN: 49 mL/min — AB (ref >60)
GLUCOSE: 104 mg/dL — AB (ref 65–99)
GLUCOSE: 123 mg/dL — AB (ref 65–99)
POTASSIUM: 4.2 mmol/L (ref 3.5–5.1)
POTASSIUM: 3.9 mmol/L (ref 3.5–5.1)
Sodium: 142 mmol/L (ref 135–145)
Sodium: 138 mmol/L (ref 135–145)

## 2014-10-26 LAB — TROPONIN I
TROPONIN I: 0.06 ng/mL — AB (ref <0.031)
Troponin I: 0.04 ng/mL — ABNORMAL HIGH (ref <0.031)

## 2014-10-26 LAB — TSH: TSH: 1.951 u[IU]/mL (ref 0.350–4.500)

## 2014-10-26 LAB — CBC
HEMATOCRIT: 44.4 % (ref 35.0–47.0)
HEMOGLOBIN: 14.5 g/dL (ref 12.0–16.0)
MCH: 30.7 pg (ref 26.0–34.0)
MCHC: 32.8 g/dL (ref 32.0–36.0)
MCV: 93.7 fL (ref 80.0–100.0)
Platelets: 228 10*3/uL (ref 150–440)
RBC: 4.74 MIL/uL (ref 3.80–5.20)
RDW: 14.2 % (ref 11.5–14.5)
WBC: 7.4 10*3/uL (ref 3.6–11.0)

## 2014-10-26 LAB — HEMOGLOBIN A1C: HEMOGLOBIN A1C: 5.4 % (ref 4.0–6.0)

## 2014-10-26 LAB — LIPID PANEL
CHOLESTEROL: 136 mg/dL (ref 0–200)
HDL: 38 mg/dL — AB (ref >40)
LDL Cholesterol: 78 mg/dL (ref 0–99)
TRIGLYCERIDES: 100 mg/dL (ref <150)
Total CHOL/HDL Ratio: 3.6 RATIO
VLDL: 20 mg/dL (ref 0–40)

## 2014-10-26 LAB — PROTIME-INR
INR: 1.04
Prothrombin Time: 13.8 seconds (ref 11.4–15.0)

## 2014-10-26 MED ORDER — ASPIRIN 81 MG PO CHEW: 81.0000 mg | CHEWABLE_TABLET | ORAL | Status: DC

## 2014-10-26 MED ORDER — SODIUM CHLORIDE 0.9 % IV SOLN: INTRAVENOUS | Status: DC

## 2014-10-26 MED ORDER — SODIUM CHLORIDE 0.9 % IJ SOLN: 3.0000 mL | INTRAMUSCULAR | Status: DC | PRN

## 2014-10-26 NOTE — Care Management (Signed)
Patient presents from home with continued shortness of breath.  She has been diagnosed with new onset CHF.  Her echo shows a significant decreased LV function with EF of 20%.  She is current requiring oxygen which is new.  Patient is extremely anxious and wishing she could discharge home.  SDhe does not intend to leave AMA- just wants to be at home.  Up until this episode of illness she was very active and independent in all her adls.  Says her medicare D drug carrier is [employer].   Contacted that agency and informed that patient is no longer active with that plan.  Left a message for [employer] caseworker for benefit information in the event that cm needs to assess for coverage for higher priced meds such as Entresto or higher priced anticoagulants/antiplatelets.  Discussed the heart failure clinic, possible home health nurse, process for assessing for need of home 02.  Do not know if this patient when stable would be a candidate for heart track program.

## 2014-10-26 NOTE — Progress Notes (Signed)
[facility] [place] NAME: [name]    MR#:  [id number]  DATE OF BIRTH:  Jul 08, 1940  SUBJECTIVE: [age]-year-old female patient admitted for CHF exacerbation. No chest pain, shortness of breath is better. Scheduled to have cardiac catheterization tomorrow morning.   CHIEF COMPLAINT:   Chief Complaint  Patient presents with  . Weakness    REVIEW OF SYSTEMS:   Review of Systems  Musculoskeletal: Positive for back pain.   CONSTITUTIONAL: No fever, fatigue or weakness.  EYES: No blurred or double vision.  EARS, NOSE, AND THROAT: No tinnitus or ear pain.  RESPIRATORY: No cough, shortness of breath, wheezing or hemoptysis.  CARDIOVASCULAR: No chest pain, orthopnea, edema.  GASTROINTESTINAL: No nausea, vomiting, diarrhea or abdominal pain.  GENITOURINARY: No dysuria, hematuria.  ENDOCRINE: No polyuria, nocturia,  HEMATOLOGY: No anemia, easy bruising or bleeding SKIN: No rash or lesion. MUSCULOSKELETAL: No joint pain or arthritis.   NEUROLOGIC: No tingling, numbness, weakness.  PSYCHIATRY: No anxiety or depression.   DRUG ALLERGIES:   Allergies  Allergen Reactions  . Sugar-Protein-Starch Nausea And Vomiting    Only to sugar    VITALS:  Blood pressure 81/39, pulse 65, temperature 97.8 F (36.6 C), temperature source Oral, resp. rate 16, height 5\' 1"  (1.549 m), weight 49.669 kg (109 lb 8 oz), SpO2 88 %.  PHYSICAL EXAMINATION:  GENERAL:  74 y.o.-year-old patient lying in the bed with no acute distress.  EYES: Pupils equal, round, reactive to light and accommodation. No scleral icterus. Extraocular muscles intact.  HEENT: Head atraumatic, normocephalic. Oropharynx and nasopharynx clear.  NECK:  Supple, no jugular venous distention. No thyroid enlargement, no tenderness.  LUNGS: Normal breath sounds bilaterally, no wheezing, rales,rhonchi or crepitation. No use of accessory muscles of respiration.  CARDIOVASCULAR: S1, S2 normal.  No murmurs, rubs, or gallops.  ABDOMEN: Soft, nontender, nondistended. Bowel sounds present. No organomegaly or mass.  EXTREMITIES: Pedal edema present NEUROLOGIC: Cranial nerves II through XII are intact. Muscle strength 5/5 in all extremities. Sensation intact. Gait not checked.  PSYCHIATRIC: The patient is alert and oriented x 3.  SKIN: No obvious rash, lesion, or ulcer.    LABORATORY PANEL:   CBC  Recent Labs Lab 10/26/14 0545  WBC 7.4  HGB 14.5  HCT 44.4  PLT 228   ------------------------------------------------------------------------------------------------------------------  Chemistries   Recent Labs Lab 10/25/14 1013 10/26/14 0545  NA 138 142  K 5.4* 4.2  CL 109 107  CO2 22 27  GLUCOSE 98 104*  BUN 21* 22*  CREATININE 0.98 1.10*  CALCIUM 9.0 9.3  AST 36  --   ALT 29  --   ALKPHOS 59  --   BILITOT 0.5  --    ------------------------------------------------------------------------------------------------------------------  Cardiac Enzymes  Recent Labs Lab 10/26/14 1005  TROPONINI 0.04*   ------------------------------------------------------------------------------------------------------------------  RADIOLOGY:  Dg Chest 2 View  10/25/2014   CLINICAL DATA:  Shortness of breath.  EXAM: CHEST  2 VIEW  COMPARISON:  MRI 02/13/2014 01/02/2014.  FINDINGS: Mediastinum and hilar structures normal. Cardiomegaly mild pulmonary vascular prominence and bilateral interstitial prominence with bilateral pleural effusions. Findings consistent congestive heart failure. Basilar atelectasis and/or pneumonia. No acute bony abnormality. Degenerative changes thoracic spine. Prior thoracic and lumbar vertebroplasties. Mid thoracic spine compression fractures, age undetermined. Stable lumbar compression fractures.  IMPRESSION: 1. Cardiomegaly with mild pulmonary vascular prominence interstitial prominence with bile pleural effusions. Findings consistent congestive heart  failure. 2. Bibasilar atelectasis and/or infiltrates.   Electronically Signed  By: [place]   On: 10/25/2014 10:09    EKG:   Orders placed or performed during the hospital encounter of 10/25/14  . ED EKG  . ED EKG  . EKG 12-Lead  . EKG 12-Lead    ASSESSMENT AND PLAN:   1 . New onset acute systolic heart failure: Echo showed EF 20% with the diffuse wall motion abnormalities.  Continue aspirin, Lasix, add small dose of Coreg  Once CHF resolves. Cardio  is following, scheduled to have cardiac catheter tomorrow. Hold Lisinopril, continue topical nitroglycerin. 2. Acute respiratory failure with hypoxia likely secondary to CHF evaluation continue fluid restriction IV Lasix #3 chronic back pain and anxiety issues, h/oosteoporosis: Continue home medication. Discussed with husband.  All the records are reviewed and case discussed with Care Management/Social Workerr. Management plans discussed with the patient, family and they are in agreement CODE STATUS: full  TOTAL TIME TAKING CARE OF THIS PATIENT: 35 minutes.   POSSIBLE D/C IN 1-2DAYS, DEPENDING ON CLINICAL CONDITION.   Epifanio Lesches M.D on 10/26/2014 at 12:07 PM  Between 7am to 6pm - Pager - [phone]  After 6pm go to [email or web address] - password [email or web address] [facility]  Office  [phone]  CC: Primary care physician; Rubbie Battiest, NP

## 2014-10-26 NOTE — Progress Notes (Signed)
Patient was made an initial appointment at the outpatient [facility] on November 03, 2014 at 1:00pm. Thank you for the referral.

## 2014-10-26 NOTE — Discharge Instructions (Signed)
Heart Failure Clinic appointment on November 03, 2014 at 1:00pm with Darylene Price, [place]. Please call [phone] to reschedule.

## 2014-10-26 NOTE — Consult Note (Addendum)
Cardiology Consultation Note  Patient ID: [name], MRN: [id number], DOB/AGE: [birth year] 73 y.o. Admit date: 10/25/2014   Date of Consult: 10/26/2014 Primary Physician: Rubbie Battiest, NP Primary Cardiologist: New to [facility]  Chief Complaint: Weakness Reason for Consult: Acute systolic CHF  HPI: 74 y.o. female with h/o hepatitis C, osteoporosis, arthritis, chronic back pain s/p multiple fusions, and chronic UTI's presented to [facility] with 3 week history of increased SOB, fatigue, lower extremity edema and was found to have new onset acute systolic CHF.   No prior cardiac evaluations, including stress tests or cardiac caths. She has had a long history of chronic back pain s/p multiple fusions. She denies any inciting event of acute angina, diaphoresis, nausea, or vomiting.   She had recently seen her PCP for increased fatigue and SOB in the early half of August that dated back 3 weeks prior to her office visit at that time. She notes increased lower extremity edema, orthopnea, PND, early satiety over this time span. Her weight has actually been declining over this time span 2/2 decreased appetite and early satiety. CBC, B12, and chemistries were ok. She has been treated for her osteoporosis with Prolia injections and initially thought her symptoms were related to this, though her last injection was 6 months ago. She was later set up an appointment with Dr. Revonda Standard via phone call from patient.   Upon her arrival to [facility] she was noted to be hypoxic with pO@ of 59, O2 saturations of 89% on room air, CXR showed cardiomegaly, pulmonary vascular congestion, effusions, and CHF. Echo showed EF of 20%, akinesis of the inferior, inferoseptal, distal anterior, and apical walls. There was hypokinesis elsewhere. Cavity size was mildly dilated. Wall thickness was normal. Mild aortic regurgitation. Mild to moderate mitral regurgitation. Left atrium was mildly dilated. Right ventricle systolic was was moderate to  severely reduced. A small pericardial effusion was seen. Troponin was found to be 0.05-->0.06. BNP was 3423. She was started on IV Lasix 20 mg q 12 hours, topical nitro, aspirin, and continued on lisinopril 10 mg, and metoprolol 25 mg bid. At the time of the consult she is minus 1,500 mL for the admission.    Past Medical History  Diagnosis Date  . UTI (lower urinary tract infection)   . Arthritis   . Osteoporosis   . Hepatitis C   . Chronic systolic CHF (congestive heart failure)     a. echo 10/2014: EF 20%, AK inf, inf-sep, distal ant, & apical walls, HK elsewhere. Cavity size mildly dilated. Wall thickness nl. mild AI. Mild to mod MR. LA mildly dilated. RV sys fxn mod to severely reduced. small pericardial effusion was ID'd.       Most Recent Cardiac Studies: Echo 10/25/2014:  Study Conclusions  - Left ventricle: LVEF is severely depressed at approximately 20% with akinesis of the inferior, inferoseptal, distal anterior and apical walls; hypokinesis elsewhere. The cavity size was mildly dilated. Wall thickness was normal. - Aortic valve: There was mild regurgitation. - Mitral valve: There was mild to moderate regurgitation. - Left atrium: The atrium was mildly dilated. - Right ventricle: Systolic function was moderately to severely reduced. - Pericardium, extracardiac: A small pericardial effusion was identified. There was a left pleural effusion.   Surgical History:  Past Surgical History  Procedure Laterality Date  . Back surgery  07/14    T10, L1     Home Meds: Prior to Admission medications   Medication Sig Start Date End Date Taking?  Authorizing Provider  Calcium-Vitamin A-Vitamin D (LIQUID CALCIUM PO) Take by mouth. 1 tablespoon daily   Yes Historical Provider, MD  Chlorpheniramine Maleate (ALLERGY PO) Take 1 tablet by mouth daily.   Yes Historical Provider, MD  cholecalciferol (VITAMIN D) 1000 UNITS tablet Take 1,000 Units by mouth daily.   Yes Historical  Provider, MD  Multiple Vitamin (MULTIVITAMIN) tablet Take 1 tablet by mouth daily.   Yes Historical Provider, MD  traMADol (ULTRAM) 50 MG tablet Take 25 mg by mouth every 6 (six) hours as needed.   Yes Historical Provider, MD  clonazePAM (KLONOPIN) 0.5 MG tablet Take 1 tablet (0.5 mg total) by mouth at bedtime. 10/20/14   Rubbie Battiest, NP  denosumab (PROLIA) 60 MG/ML SOLN injection Inject 60 mg into the skin every 6 (six) months. 04/04/14   Rubbie Battiest, NP  etodolac (LODINE XL) 500 MG 24 hr tablet Take 1 tablet (500 mg total) by mouth daily. 03/22/14   Rubbie Battiest, NP  etodolac (LODINE) 500 MG tablet TAKE 1 TABLET BY MOUTH TWICE A DAY 04/18/14   Rubbie Battiest, NP  ibuprofen (ADVIL,MOTRIN) 800 MG tablet Take 1 tablet (800 mg total) by mouth 2 (two) times daily. 02/22/14   Rubbie Battiest, NP  lisinopril (PRINIVIL,ZESTRIL) 10 MG tablet Take 1 tablet (10 mg total) by mouth daily. 11/17/13   Raquel Dagoberto Ligas, NP  Magnesium 250 MG TABS Take 1 tablet by mouth daily.    Historical Provider, MD  ondansetron (ZOFRAN ODT) 4 MG disintegrating tablet Take 1 tablet (4 mg total) by mouth every 8 (eight) hours as needed for nausea or vomiting. 10/17/14   Rubbie Battiest, NP  oxyCODONE-acetaminophen (PERCOCET) 10-325 MG per tablet Take 1 tablet by mouth every 6 (six) hours as needed for pain. 02/22/14   Rubbie Battiest, NP  tiZANidine (ZANAFLEX) 4 MG tablet Take 1 tablet (4 mg total) by mouth 3 (three) times daily. 02/22/14   Rubbie Battiest, NP  traMADol (ULTRAM) 50 MG tablet TAKE 1 TABLET BY MOUTH 3 TIMES A DAY AS NEEDED FOR PAIN 09/26/14   Rubbie Battiest, NP    Inpatient Medications:  . antiseptic oral rinse  7 mL Mouth Rinse q12n4p  . aspirin EC  325 mg Oral Daily  . chlorhexidine  15 mL Mouth Rinse BID  . clonazePAM  0.5 mg Oral QHS  . docusate sodium  100 mg Oral BID  . enoxaparin (LOVENOX) injection  40 mg Subcutaneous Q24H  . etodolac  500 mg Oral Daily  . etodolac  500 mg Oral BID  . furosemide  20 mg Intravenous  Q12H  . ibuprofen  800 mg Oral BID  . lisinopril  10 mg Oral Daily  . magnesium oxide  400 mg Oral Daily  . metoprolol tartrate  25 mg Oral BID  . nitroGLYCERIN  0.5 inch Topical 4 times per day  . senna  1 tablet Oral BID  . sodium chloride  3 mL Intravenous Q12H  . tiZANidine  4 mg Oral TID      Allergies:  Allergies  Allergen Reactions  . Sugar-Protein-Starch Nausea And Vomiting    Only to sugar    Social History   Social History  . Marital Status: Married    Spouse Name: Jemila Weir  . Number of Children: 0  . Years of Education: 14   Occupational History  . Receptionist     Retired in [place]  .  Smoking status: Former Smoker -- 1.00 packs/day for 10 years  . Smokeless tobacco: Never Used  . Alcohol Use: No  . Drug Use: No  . Sexual Activity: Yes    Birth Control/ Protection: Post-menopausal   Other Topics Concern  . Not on file   Social History Narrative   Pt is 74 yo female, married to husband of 21 years.  Pt has no children.  Pt lives with husband and teacup poodle, [name]. Pt states she hurt her back last year and has not been able to participate in much physical activity since. Pt enjoys traveling with her husband in motor home, doing crossword puzzles with husband, and playing cards with friends.  Pt was raised in [place] and has lived in [place] entire adult life.  Pt worked in [employer] in [place] until [place] when they closed.       Family History  Problem Relation Age of Onset  . Arthritis Mother   . Heart disease Father   . Diabetes Father      Review of Systems: Review of Systems  Constitutional: Positive for weight loss and malaise/fatigue. Negative for fever, chills and diaphoresis.       Poor appetite   HENT: Positive for congestion.        Morning congestion  Respiratory: Positive for cough and shortness of breath. Negative for hemoptysis, sputum production and wheezing.   Cardiovascular: Positive for  orthopnea, leg swelling and PND. Negative for chest pain, palpitations and claudication.  Gastrointestinal: Negative for heartburn, nausea, vomiting and abdominal pain.  Genitourinary: Negative for hematuria.  Musculoskeletal: Negative for myalgias and falls.  Skin: Negative for rash.  Neurological: Positive for weakness. Negative for sensory change, speech change and focal weakness.  Endo/Heme/Allergies: Does not bruise/bleed easily.  Psychiatric/Behavioral: Negative for depression and substance abuse. The patient is nervous/anxious.   All other systems reviewed and are negative.    Labs:  Recent Labs  10/25/14 1013 10/26/14 0545  TROPONINI 0.05* 0.06*   Lab Results  Component Value Date   WBC 7.4 10/26/2014   HGB 14.5 10/26/2014   HCT 44.4 10/26/2014   MCV 93.7 10/26/2014   PLT 228 10/26/2014     Recent Labs Lab 10/25/14 1013 10/26/14 0545  NA 138 142  K 5.4* 4.2  CL 109 107  CO2 22 27  BUN 21* 22*  CREATININE 0.98 1.10*  CALCIUM 9.0 9.3  PROT 6.4*  --   BILITOT 0.5  --   ALKPHOS 59  --   ALT 29  --   AST 36  --   GLUCOSE 98 104*   No results found for: CHOL, HDL, LDLCALC, TRIG No results found for: DDIMER  Radiology/Studies:  Dg Chest 2 View  10/25/2014   CLINICAL DATA:  Shortness of breath.  EXAM: CHEST  2 VIEW  COMPARISON:  MRI 02/13/2014 01/02/2014.  FINDINGS: Mediastinum and hilar structures normal. Cardiomegaly mild pulmonary vascular prominence and bilateral interstitial prominence with bilateral pleural effusions. Findings consistent congestive heart failure. Basilar atelectasis and/or pneumonia. No acute bony abnormality. Degenerative changes thoracic spine. Prior thoracic and lumbar vertebroplasties. Mid thoracic spine compression fractures, age undetermined. Stable lumbar compression fractures.  IMPRESSION: 1. Cardiomegaly with mild pulmonary vascular prominence interstitial prominence with bile pleural effusions. Findings consistent congestive heart  failure. 2. Bibasilar atelectasis and/or infiltrates.   Electronically Signed   By: Marcello Moores  Register   On: 10/25/2014 10:09    EKG: sinus tachycardia, 106 bpm, LBBB (dates back to at  least to 04/03/2013)  Weights: Filed Weights   10/25/14 0928 10/26/14 0440  Weight: 109 lb (49.442 kg) 109 lb 8 oz (49.669 kg)     Physical Exam: Blood pressure 113/68, pulse 68, temperature 97.5 F (36.4 C), temperature source Oral, resp. rate 18, height 5\' 1"  (1.549 m), weight 109 lb 8 oz (49.669 kg), SpO2 95 %. Body mass index is 20.7 kg/(m^2). General: Well developed, well nourished, in no acute distress. Head: Normocephalic, atraumatic, sclera non-icteric, no xanthomas, nares are without discharge.  Neck: Negative for carotid bruits. JVD not elevated. Lungs: Bilateral crackles at the bases. Breathing is unlabored on 2L via [place]. Heart: RRR with S1 S2. II/VI systolic murmur at the apex. No rubs, or gallops appreciated. Abdomen: Soft, non-tender, non-distended with normoactive bowel sounds. No hepatomegaly. No rebound/guarding. No obvious abdominal masses. Msk:  Strength and tone appear normal for age. Extremities: No clubbing or cyanosis. No edema.  Distal pedal pulses are 2+ and equal bilaterally. Neuro: Alert and oriented X 3. No facial asymmetry. No focal deficit. Moves all extremities spontaneously. Psych:  Responds to questions appropriately with a normal affect.    Assessment and Plan:  74 y.o. female with h/o hepatitis C, osteoporosis, arthritis, and chronic UTI's presented to [facility] with 3 week history of increased SOB, fatigue, lower extremity edema and was found to have new onset acute systolic CHF.   1. New onset acute systolic CHF/ischemic cardiomyopathy: -No prior studies for comparison  -Hold lisinopril today in prep for cardiac cath on 8/25 as well as in part of 36 hour wash out period for starting Entresto post cardiac cath -Plan to start [facility] 24/26 post cardiac cath -Hold metoprolol  in acute CHF -Continue IV Lasix 20 mg q 12 hours -Continue topical nitro  -Follow BNP -Will plan for bmet at 130 PM, if she is drying out too much this afternoon will plan to hold Lasix dose this afternoon as well as her AM dose pre-cath -When able start spironolactone for cardiac remodeling   2. Acute respiratory distress with hypoxia: -On 2L oxygen via nasal cannula -In the setting of #1 -She is minus 1,500 mL so far for the admission  -Have patient ambulate if able today in the setting of her hypoxia   3. Elevated troponin: -Mildly elevated and flat trending  -Uncertain if down trending from possible initial ischemic event vs demand ischemia in the setting of numbers of 1 and 2 -Plan for cardiac cath as above  -Add lipid panel -A1C pending -Risks and benefits of cardiac catheterization have been discussed with the patient including risks of bleeding, bruising, infection, kidney damage, stroke, heart attack, possible urgent cardiac bypass, and death. The patient understands these risks and is willing to proceed with the procedure. All questions have been answered and concerns listened to.        SignedChristell Faith, PA-C Pager: [phone] 10/26/2014, 7:48 AM   Attending Note Patient seen and examined, agree with detailed note above,  Patient presentation and plan discussed on rounds.   As detailed above, worsening shortness of breath for 3 weeks Cough, chest congestion, anorexia Denies any significant chest pain though has been relatively sedentary given her severe vertebral back disease. Denies any recent viral syndromes.  She does have risk factors including 25 years of smoking though stopped 30 years ago approximately. Cholesterol around 200. She does have family history, father had MI in his 43s.  Echocardiogram showing severely depressed ejection fraction, appears global with EF 20%. Pleural  effusion noted, small to moderate in size. Clinically pleural effusion is  bilateral. Feels better on nasal cannula oxygen this morning, 30.  --We will hold beta blockers in the setting of acute systolic CHF. Can add back later. Plan for cardiac catheterization tomorrow, case #2 to rule out ischemia as a cause of her cardiomyopathy. --Following cardiac cath, will start entresto  --For now would continue Lasix 20 mg IV twice a day. For climb in BUN and creatinine will p.m. dose of Lasix  Signed: Esmond Plants  M.D., Ph.D. [facility]

## 2014-10-27 ENCOUNTER — Encounter
Admission: EM | Disposition: A | Discharge status: Home / Self Care | Payer: Self-pay | Source: Home / Self Care | Attending: Internal Medicine

## 2014-10-27 DIAGNOSIS — I25119 Atherosclerotic heart disease of native coronary artery with unspecified angina pectoris: Secondary | ICD-10-CM

## 2014-10-27 DIAGNOSIS — Z955 Presence of coronary angioplasty implant and graft: Secondary | ICD-10-CM

## 2014-10-27 HISTORY — PX: CARDIAC CATHETERIZATION: SHX172

## 2014-10-27 LAB — MRSA PCR SCREENING: MRSA by PCR: NEGATIVE

## 2014-10-27 SURGERY — LEFT HEART CATH AND CORONARY ANGIOGRAPHY
Anesthesia: Moderate Sedation

## 2014-10-27 MED ORDER — ACETAMINOPHEN 325 MG PO TABS: 650.0000 mg | ORAL_TABLET | ORAL | Status: DC | PRN

## 2014-10-27 MED ORDER — SODIUM CHLORIDE 0.9 % IV SOLN: 250.0000 mL | INTRAVENOUS | Status: DC | PRN

## 2014-10-27 MED ORDER — HEPARIN (PORCINE) IN NACL 2-0.9 UNIT/ML-% IJ SOLN
INTRAMUSCULAR | Status: AC
  Filled 2014-10-27: qty 1000

## 2014-10-27 MED ORDER — TICAGRELOR 90 MG PO TABS
ORAL_TABLET | ORAL | Status: DC | PRN
  Administered 2014-10-27: 180 mg via ORAL

## 2014-10-27 MED ORDER — ATORVASTATIN CALCIUM 80 MG PO TABS
80.0000 mg | ORAL_TABLET | Freq: Every day | ORAL | Status: DC
  Administered 2014-10-27: 80 mg via ORAL
  Filled 2014-10-27 (×2): qty 1

## 2014-10-27 MED ORDER — BIVALIRUDIN BOLUS VIA INFUSION - CUPID
INTRAVENOUS | Status: DC | PRN
  Administered 2014-10-27: 35.625 mg via INTRAVENOUS

## 2014-10-27 MED ORDER — ASPIRIN 81 MG PO CHEW
CHEWABLE_TABLET | ORAL | Status: DC | PRN
  Administered 2014-10-27: 324 mg via ORAL

## 2014-10-27 MED ORDER — IOHEXOL 300 MG/ML  SOLN
INTRAMUSCULAR | Status: DC | PRN
  Administered 2014-10-27: 130 mL via INTRA_ARTERIAL
  Administered 2014-10-27: 125 mL via INTRA_ARTERIAL

## 2014-10-27 MED ORDER — MIDAZOLAM HCL 2 MG/2ML IJ SOLN
INTRAMUSCULAR | Status: DC | PRN
  Administered 2014-10-27 (×2): 0.5 mg via INTRAVENOUS
  Administered 2014-10-27: 1 mg via INTRAVENOUS

## 2014-10-27 MED ORDER — MIDAZOLAM HCL 2 MG/2ML IJ SOLN
INTRAMUSCULAR | Status: AC
  Filled 2014-10-27: qty 2

## 2014-10-27 MED ORDER — SODIUM CHLORIDE 0.9 % IV BOLUS (SEPSIS)
500.0000 mL | Freq: Once | INTRAVENOUS | Status: AC
  Administered 2014-10-27: 500 mL via INTRAVENOUS

## 2014-10-27 MED ORDER — FENTANYL CITRATE (PF) 100 MCG/2ML IJ SOLN
INTRAMUSCULAR | Status: AC
  Filled 2014-10-27: qty 2

## 2014-10-27 MED ORDER — SODIUM CHLORIDE 0.9 % WEIGHT BASED INFUSION: 1.0000 mL/kg/h | INTRAVENOUS | Status: AC

## 2014-10-27 MED ORDER — PANTOPRAZOLE SODIUM 40 MG PO TBEC
40.0000 mg | DELAYED_RELEASE_TABLET | Freq: Every day | ORAL | Status: DC
  Administered 2014-10-27 – 2014-10-28 (×2): 40 mg via ORAL
  Filled 2014-10-27 (×2): qty 1

## 2014-10-27 MED ORDER — BIVALIRUDIN 250 MG IV SOLR
250.0000 mg | INTRAVENOUS | Status: DC | PRN
  Administered 2014-10-27: 1.75 mg/kg/h via INTRAVENOUS

## 2014-10-27 MED ORDER — ASPIRIN 81 MG PO CHEW
81.0000 mg | CHEWABLE_TABLET | Freq: Every day | ORAL | Status: DC
  Administered 2014-10-27 – 2014-10-28 (×2): 81 mg via ORAL
  Filled 2014-10-27 (×2): qty 1

## 2014-10-27 MED ORDER — ASPIRIN 81 MG PO CHEW
CHEWABLE_TABLET | ORAL | Status: AC
  Filled 2014-10-27: qty 4

## 2014-10-27 MED ORDER — BIVALIRUDIN 250 MG IV SOLR
INTRAVENOUS | Status: AC
  Filled 2014-10-27: qty 250

## 2014-10-27 MED ORDER — OXYCODONE HCL 5 MG PO TABS: 10.0000 mg | ORAL_TABLET | ORAL | Status: DC | PRN

## 2014-10-27 MED ORDER — NITROGLYCERIN 1 MG/10 ML FOR IR/CATH LAB
INTRA_ARTERIAL | Status: DC | PRN
  Administered 2014-10-27 (×2): 200 ug via INTRACORONARY

## 2014-10-27 MED ORDER — TICAGRELOR 90 MG PO TABS
ORAL_TABLET | ORAL | Status: AC
  Filled 2014-10-27: qty 2

## 2014-10-27 MED ORDER — SODIUM CHLORIDE 0.9 % IJ SOLN
3.0000 mL | INTRAMUSCULAR | Status: DC | PRN
  Administered 2014-10-27: 3 mL via INTRAVENOUS
  Filled 2014-10-27: qty 10

## 2014-10-27 MED ORDER — NITROGLYCERIN 5 MG/ML IV SOLN
INTRAVENOUS | Status: AC
  Filled 2014-10-27: qty 10

## 2014-10-27 MED ORDER — ONDANSETRON HCL 4 MG/2ML IJ SOLN
4.0000 mg | Freq: Four times a day (QID) | INTRAMUSCULAR | Status: DC | PRN
  Administered 2014-10-27 (×2): 4 mg via INTRAVENOUS
  Filled 2014-10-27 (×2): qty 2

## 2014-10-27 MED ORDER — SODIUM CHLORIDE 0.9 % IV BOLUS (SEPSIS): 1000.0000 mL | Freq: Once | INTRAVENOUS | Status: DC

## 2014-10-27 MED ORDER — SODIUM CHLORIDE 0.9 % IV SOLN: INTRAVENOUS | Status: AC

## 2014-10-27 MED ORDER — CARVEDILOL 3.125 MG PO TABS
3.1250 mg | ORAL_TABLET | Freq: Two times a day (BID) | ORAL | Status: DC
  Administered 2014-10-27: 3.125 mg via ORAL
  Filled 2014-10-27 (×2): qty 1

## 2014-10-27 MED ORDER — TICAGRELOR 90 MG PO TABS
90.0000 mg | ORAL_TABLET | Freq: Two times a day (BID) | ORAL | Status: DC
  Administered 2014-10-27 – 2014-10-28 (×2): 90 mg via ORAL
  Filled 2014-10-27 (×2): qty 1

## 2014-10-27 MED ORDER — FENTANYL CITRATE (PF) 100 MCG/2ML IJ SOLN
INTRAMUSCULAR | Status: DC | PRN
  Administered 2014-10-27: 25 ug via INTRAVENOUS
  Administered 2014-10-27: 50 ug via INTRAVENOUS
  Administered 2014-10-27: 25 ug via INTRAVENOUS

## 2014-10-27 MED ORDER — LISINOPRIL 10 MG PO TABS
5.0000 mg | ORAL_TABLET | Freq: Every day | ORAL | Status: DC
  Administered 2014-10-27 – 2014-10-28 (×2): 5 mg via ORAL
  Filled 2014-10-27 (×2): qty 1

## 2014-10-27 MED ORDER — SODIUM CHLORIDE 0.9 % IJ SOLN
3.0000 mL | Freq: Two times a day (BID) | INTRAMUSCULAR | Status: DC
  Administered 2014-10-27 – 2014-10-28 (×3): 3 mL via INTRAVENOUS

## 2014-10-27 SURGICAL SUPPLY — 22 items
BALLN MINITREK RX 2.0X12 (BALLOONS) ×4
BALLN ~~LOC~~ TREK RX 2.75X12 (BALLOONS) ×4
BALLOON MINITREK RX 2.0X12 (BALLOONS) IMPLANT
BALLOON ~~LOC~~ TREK RX 2.75X12 (BALLOONS) IMPLANT
CATH INFINITI 5FR ANG PIGTAIL (CATHETERS) ×4 IMPLANT
CATH INFINITI 5FR JL4 (CATHETERS) ×4 IMPLANT
CATH INFINITI JR4 5F (CATHETERS) ×4 IMPLANT
CATH VISTA GUIDE 6FR JR4 SH (CATHETERS) ×2 IMPLANT
CATH VISTA GUIDE 6FR XBLAD3.5 (CATHETERS) ×2 IMPLANT
CATH VISTA GUIDE 6FR XBLAD4 (CATHETERS) ×2 IMPLANT
DEVICE CLOSURE MYNXGRIP 6/7F (Vascular Products) ×2 IMPLANT
DEVICE INFLAT 30 PLUS (MISCELLANEOUS) ×2 IMPLANT
KIT MANI 3VAL PERCEP (MISCELLANEOUS) ×4 IMPLANT
NDL PERC 18GX7CM (NEEDLE) ×2 IMPLANT
NEEDLE PERC 18GX7CM (NEEDLE) ×4 IMPLANT
PACK CARDIAC CATH (CUSTOM PROCEDURE TRAY) ×4 IMPLANT
SHEATH AVANTI 5FR X 11CM (SHEATH) ×4 IMPLANT
SHEATH AVANTI 6FR X 11CM (SHEATH) ×2 IMPLANT
STENT XIENCE ALPINE RX 2.5X15 (Permanent Stent) ×2 IMPLANT
STENT XIENCE ALPINE RX 3.0X23 (Permanent Stent) ×2 IMPLANT
WIRE EMERALD 3MM-J .035X150CM (WIRE) ×4 IMPLANT
WIRE INTUITION PROPEL ST 180CM (WIRE) ×2 IMPLANT

## 2014-10-27 NOTE — Progress Notes (Signed)
Pt off the unit to cardiac cath via bed at this time

## 2014-10-27 NOTE — Progress Notes (Signed)
Patient received from Special Procedures. She is awake and alert. 2 liter [place] in use.

## 2014-10-27 NOTE — Progress Notes (Signed)
Patient: [name] / Admit Date: 10/25/2014 / Date of Encounter: 10/27/2014, 1:55 PM   Subjective:  Tolerated cardiac catheterization well, no complications Stent placed to her mid LAD and RCA Denies any chest pain symptoms Some back discomfort   Review of Systems: Review of Systems  Respiratory: Negative.   Cardiovascular: Negative.   Gastrointestinal: Negative.   Musculoskeletal: Positive for back pain.  Neurological: Positive for weakness.  Psychiatric/Behavioral: Negative.   All other systems reviewed and are negative.   Objective: Telemetry: Normal sinus rhythm Physical Exam: Blood pressure 147/76, pulse 105, temperature 97.9 F (36.6 C), temperature source Oral, resp. rate 17, height 5\' 1"  (1.549 m), weight 104 lb 12.8 oz (47.537 kg), SpO2 97 %. Body mass index is 19.81 kg/(m^2). General: Thin woman, in no acute distress. Head: Normocephalic, atraumatic, sclera non-icteric Neck: Negative for carotid bruits. JVP not elevated. Lungs: Clear bilaterally to auscultation without wheezes, rales, or rhonchi. Breathing is unlabored. Heart: RRR S1 S2 without murmurs, rubs, or gallops.  Abdomen: Soft, non-tender, non-distended with normoactive bowel sounds. No rebound/guarding. Extremities: No clubbing or cyanosis. No edema.  Neuro: Alert and oriented X 3. Moves all extremities spontaneously. Psych:  Responds to questions appropriately with a normal affect.   Intake/Output Summary (Last 24 hours) at 10/27/14 1355 Last data filed at 10/27/14 1300  Gross per 24 hour  Intake    560 ml  Output    425 ml  Net    135 ml    Inpatient Medications:  . aspirin  81 mg Oral Daily  . atorvastatin  80 mg Oral q1800  . carvedilol  3.125 mg Oral BID WC  . clonazePAM  0.5 mg Oral QHS  . docusate sodium  100 mg Oral BID  . enoxaparin (LOVENOX) injection  40 mg Subcutaneous Q24H  . etodolac  500 mg Oral Daily  . etodolac  500 mg Oral BID  . lisinopril  5 mg Oral Daily  .  magnesium oxide  400 mg Oral Daily  . nitroGLYCERIN  0.5 inch Topical 4 times per day  . pantoprazole  40 mg Oral Daily  . senna  1 tablet Oral BID  . sodium chloride  3 mL Intravenous Q12H  . ticagrelor  90 mg Oral BID  . tiZANidine  4 mg Oral TID   Infusions:  . sodium chloride 50 mL/hr at 10/27/14 1202  . sodium chloride      Labs:  Recent Labs  10/26/14 0545 10/26/14 1331  NA 142 138  K 4.2 3.9  CL 107 101  CO2 27 28  GLUCOSE 104* 123*  BUN 22* 23*  CREATININE 1.10* 1.23*  CALCIUM 9.3 9.0    Recent Labs  10/25/14 1013  AST 36  ALT 29  ALKPHOS 59  BILITOT 0.5  PROT 6.4*  ALBUMIN 3.7    Recent Labs  10/25/14 1316 10/26/14 0545  WBC 7.9 7.4  NEUTROABS 6.1  --   HGB 13.8 14.5  HCT 42.8 44.4  MCV 91.8 93.7  PLT 253 228    Recent Labs  10/25/14 1013 10/26/14 0545 10/26/14 1005  TROPONINI 0.05* 0.06* 0.04*   Invalid input(s): POCBNP  Recent Labs  10/26/14 0545  HGBA1C 5.4     Weights: Filed Weights   10/25/14 0928 10/26/14 0440 10/27/14 0513  Weight: 109 lb (49.442 kg) 109 lb 8 oz (49.669 kg) 104 lb 12.8 oz (47.537 kg)     Radiology/Studies:  Dg Chest 2 View  10/25/2014  CLINICAL DATA:  Shortness of breath.  EXAM: CHEST  2 VIEW  COMPARISON:  MRI 02/13/2014 01/02/2014.  FINDINGS: Mediastinum and hilar structures normal. Cardiomegaly mild pulmonary vascular prominence and bilateral interstitial prominence with bilateral pleural effusions. Findings consistent congestive heart failure. Basilar atelectasis and/or pneumonia. No acute bony abnormality. Degenerative changes thoracic spine. Prior thoracic and lumbar vertebroplasties. Mid thoracic spine compression fractures, age undetermined. Stable lumbar compression fractures.  IMPRESSION: 1. Cardiomegaly with mild pulmonary vascular prominence interstitial prominence with bile pleural effusions. Findings consistent congestive heart failure. 2. Bibasilar atelectasis and/or infiltrates.    Electronically Signed   By: Marcello Moores  Register   On: 10/25/2014 10:09     Assessment and Plan  74 y.o. female with h/o hepatitis C, osteoporosis, arthritis, and chronic UTI's presented to [facility] with 3 week history of increased SOB, fatigue, lower extremity edema and was found to have new onset acute systolic CHF.   1. New onset acute systolic CHF/cardiomyopathy: Based on cardiac catheterization, ischemic in nature --Agree with low-dose beta blocker and ACE inhibitor for now Would hold Lasix, review basic metabolic panel tomorrow given recent contrast load. --Could add Aldactone low-dose  2. Acute respiratory distress with hypoxia: -On 2L oxygen via nasal cannula -In the setting of #1 Symptomatically much improved after diuresis the past 24 hours  3. Elevated troponin: Likely secondary to underlying CAD, to hemodynamically significant lesions S/p pci  4. CAD, s/p PCI DES placed to the mid LAD, mid RCA Suspect hibernating myocardium contributing to her cardiomyopathy --Repeat echocardiogram as an outpatient in the future  Signed, Esmond Plants MD [place] 10/27/2014, 1:55 PM

## 2014-10-27 NOTE — Progress Notes (Signed)
Spoke with MD regarding BP 70/45  NS bolus ordered . Pt remain alert and oriented will cont. To monitor

## 2014-10-27 NOTE — Progress Notes (Signed)
Patient complaining of pain to her back and PRN tramadol given

## 2014-10-27 NOTE — Care Management Important Message (Signed)
Important Message  Patient Details  Name: [name] MRN: [id number] Date of Birth: 1940-09-07   Medicare Important Message Given:  Yes-second notification given    Darius Bump [place] 10/27/2014, 9:54 AM

## 2014-10-27 NOTE — Progress Notes (Signed)
Patient was transfer to CCU to cath lab.  Report given to receiving nurse

## 2014-10-27 NOTE — OR Nursing (Signed)
Report called to [place]

## 2014-10-27 NOTE — Progress Notes (Signed)
[facility] [place] NAME: [name]    MR#:  [id number]  DATE OF BIRTH:  September 05, 1940  SUBJECTIVE: [age]-year-old female patient admitted for CHF exacerbation. No chest pain, shortness of breath is better. Scheduled to have cardiac catheterization tomorrow morning.   CHIEF COMPLAINT:   Chief Complaint  Patient presents with  . Weakness    REVIEW OF SYSTEMS:   Review of Systems  Constitutional: Negative for fever, chills and weight loss.  HENT: Negative for congestion.   Eyes: Negative for blurred vision and double vision.  Respiratory: Negative for cough, sputum production, shortness of breath and wheezing.   Cardiovascular: Negative for chest pain, palpitations, orthopnea, leg swelling and PND.  Gastrointestinal: Negative for nausea, vomiting, abdominal pain, diarrhea, constipation and blood in stool.  Genitourinary: Negative for dysuria, urgency, frequency and hematuria.  Musculoskeletal: Negative for back pain and falls.  Neurological: Negative for dizziness, tremors, focal weakness and headaches.  Endo/Heme/Allergies: Does not bruise/bleed easily.  Psychiatric/Behavioral: Negative for depression. The patient does not have insomnia.    CONSTITUTIONAL: No fever, fatigue or weakness.  EYES: No blurred or double vision.  EARS, NOSE, AND THROAT: No tinnitus or ear pain.  RESPIRATORY: No cough, shortness of breath, wheezing or hemoptysis.  CARDIOVASCULAR: No chest pain, orthopnea, edema.  GASTROINTESTINAL: No nausea, vomiting, diarrhea or abdominal pain.  GENITOURINARY: No dysuria, hematuria.  ENDOCRINE: No polyuria, nocturia,  HEMATOLOGY: No anemia, easy bruising or bleeding SKIN: No rash or lesion. MUSCULOSKELETAL: No joint pain or arthritis.   NEUROLOGIC: No tingling, numbness, weakness.  PSYCHIATRY: No anxiety or depression.   DRUG ALLERGIES:   Allergies  Allergen Reactions  . Sugar-Protein-Starch Nausea And Vomiting     Only to sugar    VITALS:  Blood pressure 147/76, pulse 105, temperature 97.9 F (36.6 C), temperature source Oral, resp. rate 17, height 5\' 1"  (1.549 m), weight 47.537 kg (104 lb 12.8 oz), SpO2 97 %.  PHYSICAL EXAMINATION:  GENERAL:  74 y.o.-year-old patient lying in the bed with no acute distress.  EYES: Pupils equal, round, reactive to light and accommodation. No scleral icterus. Extraocular muscles intact.  HEENT: Head atraumatic, normocephalic. Oropharynx and nasopharynx clear.  NECK:  Supple, no jugular venous distention. No thyroid enlargement, no tenderness.  LUNGS: Normal breath sounds bilaterally, no wheezing, rales,rhonchi , few crepitations at bases. No use of accessory muscles of respiration.  CARDIOVASCULAR: S1, S2 normal. No murmurs, rubs, or gallops.  ABDOMEN: Soft, nontender, nondistended. Bowel sounds present. No organomegaly or mass.  EXTREMITIES: Pedal edema present NEUROLOGIC: Cranial nerves II through XII are intact. Muscle strength 5/5 in all extremities. Sensation intact. Gait not checked.  PSYCHIATRIC: The patient is alert and oriented x 3.  SKIN: No obvious rash, lesion, or ulcer.    LABORATORY PANEL:   CBC  Recent Labs Lab 10/26/14 0545  WBC 7.4  HGB 14.5  HCT 44.4  PLT 228   ------------------------------------------------------------------------------------------------------------------  Chemistries   Recent Labs Lab 10/25/14 1013  10/26/14 1331  NA 138  < > 138  K 5.4*  < > 3.9  CL 109  < > 101  CO2 22  < > 28  GLUCOSE 98  < > 123*  BUN 21*  < > 23*  CREATININE 0.98  < > 1.23*  CALCIUM 9.0  < > 9.0  AST 36  --   --   ALT 29  --   --   ALKPHOS 59  --   --  BILITOT 0.5  --   --   < > = values in this interval not displayed. ------------------------------------------------------------------------------------------------------------------  Cardiac Enzymes  Recent Labs Lab 10/26/14 1005  TROPONINI 0.04*    ------------------------------------------------------------------------------------------------------------------  RADIOLOGY:  No results found.  EKG:   Orders placed or performed during the hospital encounter of 10/25/14  . ED EKG  . ED EKG  . EKG 12-Lead  . EKG 12-Lead  . EKG 12-Lead immediately post procedure  . EKG 12-Lead  . EKG 12-Lead immediately post procedure    ASSESSMENT AND PLAN:   1 . New onset acute systolic heart failure: Echo showed EF 20% with the diffuse wall motion abnormalities. Continue aspirin, Lasix, small dose of Coreg , lisinopril.  2. Ischemic cardiomyopathy, likely hibernating myocardium, status post stent placement in mid LAD and mid RCA . Continue aspirin and brilinta  3. Acute respiratory failure with hypoxia , 2/2 CHF , continue nitroglycerin as well as Lasix, following in's and outs as well as patient's weight  4 chronic back pain and anxiety issues, h/o osteoporosis: Continue home medications. Initiate patient on proton pump inhibitor Discussed with husband.  All the records are reviewed and case discussed with Care Management/Social Workerr. Management plans discussed with the patient, family and they are in agreement CODE STATUS: full  TOTAL TIME TAKING CARE OF THIS PATIENT: 35 minutes.   POSSIBLE D/C IN 1-2DAYS, DEPENDING ON CLINICAL CONDITION.   Theodoro Grist M.D on 10/27/2014 at 1:25 PM  Between 7am to 6pm - Pager - [phone]  After 6pm go to [email or web address] - password [email or web address] [facility]  Office  [phone]  CC: Primary care physician; Rubbie Battiest, NP

## 2014-10-28 ENCOUNTER — Encounter: Payer: Self-pay | Admitting: Cardiovascular Disease

## 2014-10-28 DIAGNOSIS — Z955 Presence of coronary angioplasty implant and graft: Secondary | ICD-10-CM

## 2014-10-28 DIAGNOSIS — I501 Left ventricular failure: Secondary | ICD-10-CM

## 2014-10-28 DIAGNOSIS — I951 Orthostatic hypotension: Secondary | ICD-10-CM

## 2014-10-28 LAB — BASIC METABOLIC PANEL
ANION GAP: 10 (ref 5–15)
BUN: 20 mg/dL (ref 6–20)
CALCIUM: 7.6 mg/dL — AB (ref 8.9–10.3)
CO2: 24 mmol/L (ref 22–32)
CREATININE: 1.4 mg/dL — AB (ref 0.44–1.00)
Chloride: 104 mmol/L (ref 101–111)
GFR, EST AFRICAN AMERICAN: 42 mL/min — AB (ref >60)
GFR, EST NON AFRICAN AMERICAN: 36 mL/min — AB (ref >60)
GLUCOSE: 75 mg/dL (ref 65–99)
Potassium: 3.4 mmol/L — ABNORMAL LOW (ref 3.5–5.1)
Sodium: 138 mmol/L (ref 135–145)

## 2014-10-28 LAB — CBC
HCT: 38.9 % (ref 35.0–47.0)
HEMOGLOBIN: 12.7 g/dL (ref 12.0–16.0)
MCH: 30.7 pg (ref 26.0–34.0)
MCHC: 32.7 g/dL (ref 32.0–36.0)
MCV: 93.9 fL (ref 80.0–100.0)
PLATELETS: 180 10*3/uL (ref 150–440)
RBC: 4.15 MIL/uL (ref 3.80–5.20)
RDW: 14.6 % — ABNORMAL HIGH (ref 11.5–14.5)
WBC: 6 10*3/uL (ref 3.6–11.0)

## 2014-10-28 LAB — CREATININE, SERUM
CREATININE: 1.28 mg/dL — AB (ref 0.44–1.00)
GFR calc Af Amer: 47 mL/min — ABNORMAL LOW (ref >60)
GFR, EST NON AFRICAN AMERICAN: 40 mL/min — AB (ref >60)

## 2014-10-28 MED ORDER — HEPARIN SODIUM (PORCINE) 5000 UNIT/ML IJ SOLN
5000.0000 [IU] | Freq: Three times a day (TID) | INTRAMUSCULAR | Status: DC
  Administered 2014-10-28: 5000 [IU] via SUBCUTANEOUS
  Filled 2014-10-28: qty 1

## 2014-10-28 MED ORDER — CARVEDILOL 3.125 MG PO TABS: 3.1250 mg | ORAL_TABLET | Freq: Two times a day (BID) | ORAL | Status: DC

## 2014-10-28 MED ORDER — TICAGRELOR 90 MG PO TABS: 90.0000 mg | ORAL_TABLET | Freq: Two times a day (BID) | ORAL | Status: DC

## 2014-10-28 MED ORDER — SODIUM CHLORIDE 0.9 % IV BOLUS (SEPSIS)
500.0000 mL | Freq: Once | INTRAVENOUS | Status: AC
  Administered 2014-10-28: 500 mL via INTRAVENOUS

## 2014-10-28 MED ORDER — LISINOPRIL 2.5 MG PO TABS: 5.0000 mg | ORAL_TABLET | Freq: Every day | ORAL | Status: DC

## 2014-10-28 MED ORDER — SODIUM CHLORIDE 0.9 % IV BOLUS (SEPSIS): 1000.0000 mL | Freq: Once | INTRAVENOUS | Status: DC

## 2014-10-28 MED ORDER — FUROSEMIDE 20 MG PO TABS: 20.0000 mg | ORAL_TABLET | ORAL | Status: DC

## 2014-10-28 MED ORDER — POTASSIUM CHLORIDE ER 10 MEQ PO TBCR: 10.0000 meq | EXTENDED_RELEASE_TABLET | ORAL | Status: DC

## 2014-10-28 MED ORDER — SENNA 8.6 MG PO TABS: 1.0000 | ORAL_TABLET | Freq: Two times a day (BID) | ORAL | Status: DC

## 2014-10-28 MED ORDER — ASPIRIN 81 MG PO CHEW: 81.0000 mg | CHEWABLE_TABLET | Freq: Every day | ORAL | Status: DC

## 2014-10-28 MED ORDER — DOCUSATE SODIUM 100 MG PO CAPS: 100.0000 mg | ORAL_CAPSULE | Freq: Two times a day (BID) | ORAL | Status: DC

## 2014-10-28 MED ORDER — ENOXAPARIN SODIUM 30 MG/0.3ML ~~LOC~~ SOLN: 30.0000 mg | SUBCUTANEOUS | Status: DC

## 2014-10-28 MED ORDER — OXYCODONE-ACETAMINOPHEN 10-325 MG PO TABS: 1.0000 | ORAL_TABLET | Freq: Four times a day (QID) | ORAL | Status: DC | PRN

## 2014-10-28 MED ORDER — ATORVASTATIN CALCIUM 80 MG PO TABS: 80.0000 mg | ORAL_TABLET | Freq: Every day | ORAL | Status: DC

## 2014-10-28 MED ORDER — POTASSIUM CHLORIDE CRYS ER 20 MEQ PO TBCR
40.0000 meq | EXTENDED_RELEASE_TABLET | Freq: Once | ORAL | Status: AC
  Administered 2014-10-28: 40 meq via ORAL
  Filled 2014-10-28: qty 2

## 2014-10-28 MED ORDER — TRAMADOL HCL 50 MG PO TABS: 50.0000 mg | ORAL_TABLET | Freq: Four times a day (QID) | ORAL | Status: DC | PRN

## 2014-10-28 MED ORDER — PANTOPRAZOLE SODIUM 40 MG PO TBEC: 40.0000 mg | DELAYED_RELEASE_TABLET | Freq: Every day | ORAL | Status: DC

## 2014-10-28 NOTE — Progress Notes (Signed)
Patient: [name] / Admit Date: 10/25/2014 / Date of Encounter: 10/28/2014, 9:06 AM   Subjective: Drop in BP overnight, no sx. Was given low dose coreg and lisinopril Wants to go home. Very weak.  Has not ambulated, renal function stable  Review of Systems: ROS Review of Systems  Respiratory: Negative.  Cardiovascular: Negative.  Gastrointestinal: Negative.  Musculoskeletal: Positive for back pain.  Neurological: Positive for weakness.  Psychiatric/Behavioral: Negative.  All other systems reviewed and are negative.  Objective: Telemetry: NSR, LBBB Physical Exam: Blood pressure 103/75, pulse 68, temperature 97.9 F (36.6 C), temperature source Oral, resp. rate 19, height 5\' 1"  (1.549 m), weight 104 lb 12.8 oz (47.537 kg), SpO2 100 %. Body mass index is 19.81 kg/(m^2). General: Well developed, well nourished, in no acute distress. Head: Normocephalic, atraumatic, sclera non-icteric, no xanthomas, nares are without discharge. Neck: Negative for carotid bruits. JVP not elevated. Lungs: Clear bilaterally to auscultation without wheezes, rales, or rhonchi. Breathing is unlabored. Heart: RRR S1 S2 without murmurs, rubs, or gallops.  Abdomen: Soft, non-tender, non-distended with normoactive bowel sounds. No rebound/guarding. Extremities: No clubbing or cyanosis. No edema. Distal pedal pulses are 2+ and equal bilaterally. Neuro: Alert and oriented X 3. Moves all extremities spontaneously. Psych:  Responds to questions appropriately with a normal affect.   Intake/Output Summary (Last 24 hours) at 10/28/14 0906 Last data filed at 10/28/14 0600  Gross per 24 hour  Intake 821.13 ml  Output    575 ml  Net 246.13 ml    Inpatient Medications:  . aspirin  81 mg Oral Daily  . atorvastatin  80 mg Oral q1800  . carvedilol  3.125 mg Oral BID WC  . clonazePAM  0.5 mg Oral QHS  . docusate sodium  100 mg Oral BID  . enoxaparin (LOVENOX) injection  40 mg Subcutaneous Q24H  .  lisinopril  5 mg Oral Daily  . magnesium oxide  400 mg Oral Daily  . nitroGLYCERIN  0.5 inch Topical 4 times per day  . pantoprazole  40 mg Oral Daily  . senna  1 tablet Oral BID  . sodium chloride  3 mL Intravenous Q12H  . ticagrelor  90 mg Oral BID  . tiZANidine  4 mg Oral TID   Infusions:    Labs:  Recent Labs  10/26/14 1331 10/28/14 0440  NA 138 138  K 3.9 3.4*  CL 101 104  CO2 28 24  GLUCOSE 123* 75  BUN 23* 20  CREATININE 1.23* 1.28*  1.40*  CALCIUM 9.0 7.6*    Recent Labs  10/25/14 1013  AST 36  ALT 29  ALKPHOS 59  BILITOT 0.5  PROT 6.4*  ALBUMIN 3.7    Recent Labs  10/25/14 1316 10/26/14 0545 10/28/14 0440  WBC 7.9 7.4 6.0  NEUTROABS 6.1  --   --   HGB 13.8 14.5 12.7  HCT 42.8 44.4 38.9  MCV 91.8 93.7 93.9  PLT 253 228 180    Recent Labs  10/25/14 1013 10/26/14 0545 10/26/14 1005  TROPONINI 0.05* 0.06* 0.04*   Invalid input(s): POCBNP  Recent Labs  10/26/14 0545  HGBA1C 5.4     Weights: Filed Weights   10/25/14 0928 10/26/14 0440 10/27/14 0513  Weight: 109 lb (49.442 kg) 109 lb 8 oz (49.669 kg) 104 lb 12.8 oz (47.537 kg)     Radiology/Studies:  Dg Chest 2 View  10/25/2014   CLINICAL DATA:  Shortness of breath.  EXAM: CHEST  2 VIEW  COMPARISON:  MRI 02/13/2014 01/02/2014.  FINDINGS: Mediastinum and hilar structures normal. Cardiomegaly mild pulmonary vascular prominence and bilateral interstitial prominence with bilateral pleural effusions. Findings consistent congestive heart failure. Basilar atelectasis and/or pneumonia. No acute bony abnormality. Degenerative changes thoracic spine. Prior thoracic and lumbar vertebroplasties. Mid thoracic spine compression fractures, age undetermined. Stable lumbar compression fractures.  IMPRESSION: 1. Cardiomegaly with mild pulmonary vascular prominence interstitial prominence with bile pleural effusions. Findings consistent congestive heart failure. 2. Bibasilar atelectasis and/or infiltrates.    Electronically Signed   By: Marcello Moores  Register   On: 10/25/2014 10:09     Assessment and Plan  74 y.o. female with h/o hepatitis C, osteoporosis, arthritis, and chronic UTI's presented to [facility] with 3 week history of increased SOB, fatigue, lower extremity edema and was found to have new onset acute systolic CHF.   1. New onset acute systolic CHF/cardiomyopathy: Based on cardiac catheterization, ischemic in nature --Hold parameters on beta blocker and ACE inhibitor for now, At d/c, if BP remains low, would hold coreg Would hold Lasix, given low BP overnight --Could add Aldactone low-dose  2. Acute respiratory distress with hypoxia: -On 2L oxygen via nasal cannula -In the setting of #1 Symptomatically much improved after diuresis the past 24 hours  3. Elevated troponin: Likely secondary to underlying CAD, to hemodynamically significant lesions S/p pci  4. CAD, s/p PCI DES placed to the mid LAD, mid RCA Suspect hibernating myocardium contributing to her cardiomyopathy --Repeat echocardiogram as an outpatient in the future  Signed, Esmond Plants, MD [place] 10/28/2014, 9:06 AM

## 2014-10-28 NOTE — Progress Notes (Signed)
Pt lying in bed AAOx4 pt denies pain or discomfort, pt groin site clean and dry , no bleeding or hematoma noted.

## 2014-10-28 NOTE — Discharge Summary (Signed)
[facility] [place] NAME: [name]    MR#:  [id number]  DATE OF BIRTH:  [birth year]  DATE OF ADMISSION:  10/25/2014 ADMITTING PHYSICIAN: Theodoro Grist, MD  DATE OF DISCHARGE: No discharge date for patient encounter.  PRIMARY CARE PHYSICIAN: Rubbie Battiest, NP     ADMISSION DIAGNOSIS:  SHOB, Weakness cardiomyopathy  DISCHARGE DIAGNOSIS:  Principal Problem:   Acute respiratory failure with hypoxemia Active Problems:   CHF (congestive heart failure)   Acute respiratory failure with hypoxia   Acute systolic CHF (congestive heart failure)   Acute pulmonary edema   Acute pulmonary edema with congestive heart failure   Congestive dilated cardiomyopathy   Coronary artery disease involving native coronary artery of native heart with angina pectoris   History of coronary artery stent placement   Orthostatic hypotension   SECONDARY DIAGNOSIS:   Past Medical History  Diagnosis Date  . UTI (lower urinary tract infection)   . Arthritis   . Osteoporosis   . Hepatitis C   . Chronic systolic CHF (congestive heart failure)     a. echo 10/2014: EF 20%, AK inf, inf-sep, distal ant, & apical walls, HK elsewhere. Cavity size mildly dilated. Wall thickness nl. mild AI. Mild to mod MR. LA mildly dilated. RV sys fxn mod to severely reduced. small pericardial effusion was ID'd.     .pro HOSPITAL COURSE:  [name] is 74 year old Caucasian female with past medical history significant for history of osteoporosis and back pains who presents to the hospital with 4 week history of progressive shortness of breath and weakness. She was noted to be hypoxic with PO2 level of 59, and saturations of 89 and chest x-ray revealed congestive heart failure and patient was admitted to the hospital for further evaluation and workup. He was initiated on diuretics and her hypoxia resolved. Echocardiogram was performed and it revealed severely depressed left  ventricular ejection fraction of 20% with wall motion abnormalities and valvular regurgitation. Cardiology consultation was obtained because of cardiomyopathy as well as mildly elevated troponin. Patient underwent cardiac catheterization and had 2 drug-eluting stent placed in the mid LAD and mid RCA after which she was started on aspirin and Brilinta.  Discussion by problem 1 . New onset acute systolic heart failure: Echo showed EF 20% with the diffuse wall motion abnormalities. She underwent cardiac catheterization on 10/27/2014 and had drug-eluting stents placed in the mid LAD and mid RCA. Cardiologist suspected hibernating myocardium contributing to her cardiomyopathy. Patient was advised to continue aspirin, Lasix, small dose of  Lisinopril at bedtime. She is to follow-up with Dr. Candis Musa, and initiated on Coreg as well as spironolactone as outpatient. These medications were not initiated due to hypotension over the past 24 hours  2. Ischemic cardiomyopathy, likely hibernating myocardium, status post stent placement in mid LAD and mid RCA . Continue aspirin and brilinta, follow-up with cardiology as outpatient. Due to ACE inhibitor and advance it as needed depending on patient's blood pressure readings  3. Acute respiratory failure with hypoxia , 2/2 CHF , continue  Lasix orally every second day, patient is off oxygen,  now on the room air   4 chronic back pain and anxiety issues, h/o osteoporosis: Continue home medications. Initiate patient on proton pump inhibitor 5. Coronary artery disease status post cardiac catheterization on 10/27/2014 with 2 drug-eluting stents placed in mid LAD and mid RCA. Patient is to continue therapy with aspirin and Brilinta 6. Hypotension, likely due to medications,  resolved. Holding medications. Watch patient's blood pressure readings closely on current therapy 7. Renal insufficiency due to hypotension as well as IV dye during cardiac catheterization, follow with therapy.  It is recommended to check patient's creatinine level within one week after discharge or earlier.    DISCHARGE CONDITIONS:   Fair  CONSULTS OBTAINED:  Treatment Team:  Minna Merritts, MD  DRUG ALLERGIES:   Allergies  Allergen Reactions  . Sugar-Protein-Starch Nausea And Vomiting    Only to sugar    DISCHARGE MEDICATIONS:   Current Discharge Medication List    START taking these medications   Details  aspirin 81 MG chewable tablet Chew 1 tablet (81 mg total) by mouth daily. Qty: 30 tablet, Refills: 6    atorvastatin (LIPITOR) 80 MG tablet Take 1 tablet (80 mg total) by mouth daily at 6 PM. Qty: 30 tablet, Refills: 6    docusate sodium (COLACE) 100 MG capsule Take 1 capsule (100 mg total) by mouth 2 (two) times daily. Qty: 10 capsule, Refills: 0    furosemide (LASIX) 20 MG tablet Take 1 tablet (20 mg total) by mouth every other day. Qty: 30 tablet, Refills: 6    pantoprazole (PROTONIX) 40 MG tablet Take 1 tablet (40 mg total) by mouth daily. Qty: 30 tablet, Refills: 3    potassium chloride (K-DUR) 10 MEQ tablet Take 1 tablet (10 mEq total) by mouth every other day. Qty: 30 tablet, Refills: 6    senna (SENOKOT) 8.6 MG TABS tablet Take 1 tablet (8.6 mg total) by mouth 2 (two) times daily. Qty: 120 each, Refills: 0    ticagrelor (BRILINTA) 90 MG TABS tablet Take 1 tablet (90 mg total) by mouth 2 (two) times daily. Qty: 60 tablet, Refills: 6      CONTINUE these medications which have CHANGED   Details  lisinopril (PRINIVIL,ZESTRIL) 2.5 MG tablet Take 2 tablets (5 mg total) by mouth at bedtime. Qty: 30 tablet, Refills: 6    oxyCODONE-acetaminophen (PERCOCET) 10-325 MG per tablet Take 1 tablet by mouth every 6 (six) hours as needed for pain. Qty: 40 tablet, Refills: 0    traMADol (ULTRAM) 50 MG tablet Take 1 tablet (50 mg total) by mouth every 6 (six) hours as needed for moderate pain. Qty: 90 tablet, Refills: 0      CONTINUE these medications which have NOT  CHANGED   Details  Calcium-Vitamin A-Vitamin D (LIQUID CALCIUM PO) Take by mouth. 1 tablespoon daily    Chlorpheniramine Maleate (ALLERGY PO) Take 1 tablet by mouth daily.    cholecalciferol (VITAMIN D) 1000 UNITS tablet Take 1,000 Units by mouth daily.    Multiple Vitamin (MULTIVITAMIN) tablet Take 1 tablet by mouth daily.    clonazePAM (KLONOPIN) 0.5 MG tablet Take 1 tablet (0.5 mg total) by mouth at bedtime. Qty: 30 tablet, Refills: 0    denosumab (PROLIA) 60 MG/ML SOLN injection Inject 60 mg into the skin every 6 (six) months. Qty: 1 Syringe, Refills: 0    Magnesium 250 MG TABS Take 1 tablet by mouth daily.    ondansetron (ZOFRAN ODT) 4 MG disintegrating tablet Take 1 tablet (4 mg total) by mouth every 8 (eight) hours as needed for nausea or vomiting. Qty: 20 tablet, Refills: 0    tiZANidine (ZANAFLEX) 4 MG tablet Take 1 tablet (4 mg total) by mouth 3 (three) times daily. Qty: 30 tablet, Refills: 0      STOP taking these medications     etodolac (LODINE XL) 500 MG 24 hr  tablet      etodolac (LODINE) 500 MG tablet      ibuprofen (ADVIL,MOTRIN) 800 MG tablet          DISCHARGE INSTRUCTIONS:    Follow-up with primary care physician, Dr. Flonnie Overman, cardiologist Dr. Candis Musa as outpatient  If you experience worsening of your admission symptoms, develop shortness of breath, life threatening emergency, suicidal or homicidal thoughts you must seek medical attention immediately by calling 911 or calling your MD immediately  if symptoms less severe.  You Must read complete instructions/literature along with all the possible adverse reactions/side effects for all the Medicines you take and that have been prescribed to you. Take any new Medicines after you have completely understood and accept all the possible adverse reactions/side effects.   Please note  You were cared for by a hospitalist during your hospital stay. If you have any questions about your discharge medications or the  care you received while you were in the hospital after you are discharged, you can call the unit and asked to speak with the hospitalist on call if the hospitalist that took care of you is not available. Once you are discharged, your primary care physician will handle any further medical issues. Please note that NO REFILLS for any discharge medications will be authorized once you are discharged, as it is imperative that you return to your primary care physician (or establish a relationship with a primary care physician if you do not have one) for your aftercare needs so that they can reassess your need for medications and monitor your lab values.    Today   CHIEF COMPLAINT:   Chief Complaint  Patient presents with  . Weakness    HISTORY OF PRESENT ILLNESS:  Alaysia Steinberg  is a 74 y.o. female with a known history of osteoporosis and back pains who presents to the hospital with 4 week history of progressive shortness of breath and weakness. She was noted to be hypoxic with PO2 level of 59, and saturations of 89 and chest x-ray revealed congestive heart failure and patient was admitted to the hospital for further evaluation and workup. He was initiated on diuretics and her hypoxia resolved. Echocardiogram was performed and it revealed severely depressed left ventricular ejection fraction of 20% with wall motion abnormalities and valvular regurgitation. Cardiology consultation was obtained because of cardiomyopathy as well as mildly elevated troponin. Patient underwent cardiac catheterization and had 2 drug-eluting stent placed in the mid LAD and mid RCA after which she was started on aspirin and Brilinta.  Discussion by problem 1 . New onset acute systolic heart failure: Echo showed EF 20% with the diffuse wall motion abnormalities. She underwent cardiac catheterization on 10/27/2014 and had drug-eluting stents placed in the mid LAD and mid RCA. Cardiologist suspected hibernating myocardium contributing  to her cardiomyopathy. Patient was advised to continue aspirin, Lasix, small dose of  Lisinopril at bedtime. She is to follow-up with Dr. Candis Musa, and initiated on Coreg as well as spironolactone as outpatient. These medications were not initiated due to hypotension over the past 24 hours  2. Ischemic cardiomyopathy, likely hibernating myocardium, status post stent placement in mid LAD and mid RCA . Continue aspirin and brilinta, follow-up with cardiology as outpatient. Due to ACE inhibitor and advance it as needed depending on patient's blood pressure readings  3. Acute respiratory failure with hypoxia , 2/2 CHF , continue  Lasix orally every second day, patient is off oxygen,  now on the room air   4  chronic back pain and anxiety issues, h/o osteoporosis: Continue home medications. Initiate patient on proton pump inhibitor 5. Coronary artery disease status post cardiac catheterization on 10/27/2014 with 2 drug-eluting stents placed in mid LAD and mid RCA. Patient is to continue therapy with aspirin and Brilinta 6. Hypotension, likely due to medications, resolved. Holding medications. Watch patient's blood pressure readings closely on current therapy 7. Renal insufficiency due to hypotension as well as IV dye during cardiac catheterization, follow with therapy. It is recommended to check patient's creatinine level within one week after discharge or earlier.     VITAL SIGNS:  Blood pressure 128/67, pulse 94, temperature 97.9 F (36.6 C), temperature source Oral, resp. rate 31, height 5\' 1"  (1.549 m), weight 47.537 kg (104 lb 12.8 oz), SpO2 100 %.  I/O:   Intake/Output Summary (Last 24 hours) at 10/28/14 1443 Last data filed at 10/28/14 0900  Gross per 24 hour  Intake    458 ml  Output    150 ml  Net    308 ml    PHYSICAL EXAMINATION:  GENERAL:  74 y.o.-year-old patient lying in the bed with no acute distress. Lying in the chair very comfortable. She is off oxygen now EYES: Pupils equal,  round, reactive to light and accommodation. No scleral icterus. Extraocular muscles intact.  HEENT: Head atraumatic, normocephalic. Oropharynx and nasopharynx clear.  NECK:  Supple, no jugular venous distention. No thyroid enlargement, no tenderness.  LUNGS: Normal breath sounds bilaterally, no wheezing, rales,rhonchi or crepitation. No use of accessory muscles of respiration.  CARDIOVASCULAR: S1, S2 normal. No murmurs, rubs, or gallops.  ABDOMEN: Soft, non-tender, non-distended. Bowel sounds present. No organomegaly or mass.  EXTREMITIES: No pedal edema, cyanosis, or clubbing.  NEUROLOGIC: Cranial nerves II through XII are intact. Muscle strength 5/5 in all extremities. Sensation intact. Gait not checked.  PSYCHIATRIC: The patient is alert and oriented x 3.  SKIN: No obvious rash, lesion, or ulcer.   DATA REVIEW:   CBC  Recent Labs Lab 10/28/14 0440  WBC 6.0  HGB 12.7  HCT 38.9  PLT 180    Chemistries   Recent Labs Lab 10/25/14 1013  10/28/14 0440  NA 138  < > 138  K 5.4*  < > 3.4*  CL 109  < > 104  CO2 22  < > 24  GLUCOSE 98  < > 75  BUN 21*  < > 20  CREATININE 0.98  < > 1.28*  1.40*  CALCIUM 9.0  < > 7.6*  AST 36  --   --   ALT 29  --   --   ALKPHOS 59  --   --   BILITOT 0.5  --   --   < > = values in this interval not displayed.  Cardiac Enzymes  Recent Labs Lab 10/26/14 1005  TROPONINI 0.04*    Microbiology Results  Results for orders placed or performed during the hospital encounter of 10/25/14  MRSA PCR Screening     Status: None   Collection Time: 10/27/14 12:13 PM  Result Value Ref Range Status   MRSA by PCR NEGATIVE NEGATIVE Final    Comment:        The GeneXpert MRSA Assay (FDA approved for NASAL specimens only), is one component of a comprehensive MRSA colonization surveillance program. It is not intended to diagnose MRSA infection nor to guide or monitor treatment for MRSA infections.     RADIOLOGY:  No results found.  EKG:    Orders  placed or performed during the hospital encounter of 10/25/14  . ED EKG  . ED EKG  . EKG 12-Lead  . EKG 12-Lead  . EKG 12-Lead immediately post procedure  . EKG 12-Lead  . EKG 12-Lead immediately post procedure  . EKG 12-Lead      Management plans discussed with the patient, family and they are in agreement.  CODE STATUS:     Code Status Orders        Start     Ordered   10/27/14 1203  Full code   Continuous     10/27/14 1203      TOTAL TIME TAKING CARE OF THIS PATIENT: 45 minutes.  Discharge planning was discussed with cardiology extensively  Maui Ahart M.D on 10/28/2014 at 2:43 PM  Between 7am to 6pm - Pager - [phone]  After 6pm go to [email or web address] - password [email or web address] [facility]  Office  [phone]  CC: Primary care physician; Rubbie Battiest, NP

## 2014-10-28 NOTE — Progress Notes (Signed)
Spoke with MD regarding BP repeat a bolus of 594ml pt lungs clear and asymptomatic will cont. To monitor

## 2014-10-28 NOTE — Care Management (Signed)
Patient had DES placed LAD and RCA 8/25 and transferred to icu.  It appears patient has been placed on Brilinta. Will need to assess whether this will require prior auth with patient [name]

## 2014-10-28 NOTE — Progress Notes (Signed)
Patient alert and oriented. Walked around 100 feet in unit- Pt tolerated well- BP 122/63 and HR 93- Oxygen 94% afterwards- Pt slightly short of breath afterwards- is resting in chair at this time.

## 2014-10-28 NOTE — Progress Notes (Signed)
Spoke with MD concerning low BP post 500 ml bolus 73/30 (44). Pt remains alert x4 and able to communicate well. New orders to administer a repeat 500 ml bolus

## 2014-10-28 NOTE — Progress Notes (Signed)
74 y/o F s/p DEL to LAD and RCA on Lovenox 40 mg daily for dvt prophylaxis.   Ht: 61", Wt: 47.5 kg, CrCl: 29.4  Creatinine clearance is close to borderline for changing dose of Lovenox. Will go ahead and decrease dose to 30 mg daily due to weight on the low side.

## 2014-10-28 NOTE — Progress Notes (Signed)
   10/28/14 1504  Clinical Encounter Type  Visited With Patient and family together  Visit Type Initial  Consult/Referral To Chaplain  Spiritual Encounters  Spiritual Needs Other (Comment)  Stress Factors  Patient Stress Factors None identified  Family Stress Factors None identified  Chaplain rounded in the unit and went in to check in with patient. Patient was anticipating going home and was very pleasant. Chaplain Yemaya Barnier A. Thanvi Blincoe Ext. [phone]

## 2014-10-30 ENCOUNTER — Telehealth: Payer: Self-pay | Admitting: Physician Assistant

## 2014-10-30 MED ORDER — CLOPIDOGREL BISULFATE 75 MG PO TABS: 75.0000 mg | ORAL_TABLET | Freq: Every day | ORAL | Status: DC

## 2014-10-30 NOTE — Telephone Encounter (Signed)
The patient reports she can not take Brilinta.  She has not slept at all and she is breathing hard constantly.  Given her age and weight, I changed her to plavix.  Tarri Fuller PAC

## 2014-10-31 NOTE — Telephone Encounter (Signed)
brilinta can cause SOB for a little while, Typically goes away, Take caffeine prior to brilinta Better pill if tolerated Otherwise plavix

## 2014-11-01 NOTE — Telephone Encounter (Signed)
Spoke w/ pt.  Advised her of Dr. Donivan Scull recommendation.

## 2014-11-01 NOTE — Telephone Encounter (Signed)
She states that she is unwilling to switch back to Brilinta, as her sx were so bad that "I felt that I was taking my last breath". She is appreciative of the call and will call back w/ any questions or concerns.

## 2014-11-03 ENCOUNTER — Encounter: Payer: Self-pay | Admitting: Family

## 2014-11-03 ENCOUNTER — Ambulatory Visit: Payer: PPO | Attending: Family | Admitting: Family

## 2014-11-03 VITALS — BP 135/55 | HR 100 | Resp 20 | Ht 61.0 in | Wt 108.0 lb

## 2014-11-03 DIAGNOSIS — B1920 Unspecified viral hepatitis C without hepatic coma: Secondary | ICD-10-CM | POA: Insufficient documentation

## 2014-11-03 DIAGNOSIS — Z79899 Other long term (current) drug therapy: Secondary | ICD-10-CM | POA: Insufficient documentation

## 2014-11-03 DIAGNOSIS — Z955 Presence of coronary angioplasty implant and graft: Secondary | ICD-10-CM

## 2014-11-03 DIAGNOSIS — M810 Age-related osteoporosis without current pathological fracture: Secondary | ICD-10-CM | POA: Diagnosis not present

## 2014-11-03 DIAGNOSIS — I5022 Chronic systolic (congestive) heart failure: Secondary | ICD-10-CM | POA: Insufficient documentation

## 2014-11-03 DIAGNOSIS — I10 Essential (primary) hypertension: Secondary | ICD-10-CM | POA: Diagnosis not present

## 2014-11-03 DIAGNOSIS — Z7982 Long term (current) use of aspirin: Secondary | ICD-10-CM | POA: Diagnosis not present

## 2014-11-03 DIAGNOSIS — Z87891 Personal history of nicotine dependence: Secondary | ICD-10-CM | POA: Diagnosis not present

## 2014-11-03 DIAGNOSIS — R531 Weakness: Secondary | ICD-10-CM | POA: Diagnosis not present

## 2014-11-03 NOTE — Progress Notes (Signed)
Subjective:    Patient ID: [name], female    DOB: 02-02-1941, 74 y.o.   MRN: [id number]  Congestive Heart Failure Presents for initial visit. The disease course has been improving. Associated symptoms include edema and fatigue. Pertinent negatives include no abdominal pain, chest pain, chest pressure, muscle weakness, orthopnea, palpitations or shortness of breath. The symptoms have been improving. Past treatments include ACE inhibitors and salt and fluid restriction. The treatment provided moderate relief. Compliance with prior treatments has been good. Her past medical history is significant for HTN. There is no history of CVA or DM. She has one 1st degree relative with heart disease. Compliance with total regimen is 76-100%.  Other This is a chronic (fatigue) problem. The current episode started more than 1 month ago. The problem occurs daily. The problem has been unchanged. Associated symptoms include coughing (dry cough), fatigue, headaches (occasionally) and weakness. Pertinent negatives include no abdominal pain, chest pain, congestion, numbness, sore throat or visual change. The symptoms are aggravated by walking and standing. She has tried position changes for the symptoms. The treatment provided mild relief.    Past Medical History  Diagnosis Date  . UTI (lower urinary tract infection)   . Arthritis   . Osteoporosis   . Hepatitis C   . Chronic systolic CHF (congestive heart failure)     a. echo 10/2014: EF 20%, AK inf, inf-sep, distal ant, & apical walls, HK elsewhere. Cavity size mildly dilated. Wall thickness nl. mild AI. Mild to mod MR. LA mildly dilated. RV sys fxn mod to severely reduced. small pericardial effusion was ID'd.    Past Surgical History  Procedure Laterality Date  . Back surgery  07/14    T10, L1  . Cardiac catheterization Bilateral 10/27/2014    Procedure: Left Heart Cath and Coronary Angiography;  Surgeon: Minna Merritts, MD;  Location: [place] [facility];  Service: Cardiovascular;  Laterality: Bilateral;  . Cardiac catheterization N/A 10/27/2014    Procedure: Coronary Stent Intervention;  Surgeon: Wellington Hampshire, MD;  Location: [place] [facility];  Service: Cardiovascular;  Laterality: N/A;    Family History  Problem Relation Age of Onset  . Arthritis Mother   . Heart disease Father     passed at 69  . Diabetes Father     Social History  Substance Use Topics  . Smoking status: Former Smoker -- 1.00 packs/day for 10 years  . Smokeless tobacco: Never Used  . Alcohol Use: No    Allergies  Allergen Reactions  . Sugar-Protein-Starch Nausea And Vomiting    Only to sugar    Prior to Admission medications   Medication Sig Start Date End Date Taking? Authorizing Provider  aspirin 81 MG chewable tablet Chew 1 tablet (81 mg total) by mouth daily. 10/28/14  Yes Theodoro Grist, MD  atorvastatin (LIPITOR) 80 MG tablet Take 1 tablet (80 mg total) by mouth daily at 6 PM. 10/28/14  Yes Theodoro Grist, MD  Calcium-Vitamin A-Vitamin D (LIQUID CALCIUM PO) Take by mouth. 1 tablespoon daily   Yes Historical Provider, MD  Chlorpheniramine Maleate (ALLERGY PO) Take 1 tablet by mouth daily.   Yes Historical Provider, MD  cholecalciferol (VITAMIN D) 1000 UNITS tablet Take 1,000 Units by mouth daily.   Yes Historical Provider, MD  clonazePAM (KLONOPIN) 0.5 MG tablet Take 1 tablet (0.5 mg total) by mouth at bedtime. 10/20/14  Yes Rubbie Battiest, NP  clopidogrel (PLAVIX) 75 MG tablet Take 1 tablet (75 mg total)  by mouth daily. 10/30/14  Yes Brett Canales, PA-C  denosumab (PROLIA) 60 MG/ML SOLN injection Inject 60 mg into the skin every 6 (six) months. 04/04/14  Yes Rubbie Battiest, NP  docusate sodium (COLACE) 100 MG capsule Take 1 capsule (100 mg total) by mouth 2 (two) times daily. 10/28/14  Yes Theodoro Grist, MD  furosemide (LASIX) 20 MG tablet Take 1 tablet (20 mg total) by mouth every other day. 10/28/14  Yes Theodoro Grist, MD  lisinopril (PRINIVIL,ZESTRIL)  2.5 MG tablet Take 2 tablets (5 mg total) by mouth at bedtime. 10/28/14  Yes Theodoro Grist, MD  Magnesium 250 MG TABS Take 1 tablet by mouth daily.   Yes Historical Provider, MD  Multiple Vitamin (MULTIVITAMIN) tablet Take 1 tablet by mouth daily.   Yes Historical Provider, MD  ondansetron (ZOFRAN ODT) 4 MG disintegrating tablet Take 1 tablet (4 mg total) by mouth every 8 (eight) hours as needed for nausea or vomiting. 10/17/14  Yes Rubbie Battiest, NP  oxyCODONE-acetaminophen (PERCOCET) 10-325 MG per tablet Take 1 tablet by mouth every 6 (six) hours as needed for pain. 10/28/14  Yes Theodoro Grist, MD  potassium chloride (K-DUR) 10 MEQ tablet Take 1 tablet (10 mEq total) by mouth every other day. 10/28/14  Yes Theodoro Grist, MD  senna (SENOKOT) 8.6 MG TABS tablet Take 1 tablet (8.6 mg total) by mouth 2 (two) times daily. 10/28/14  Yes Theodoro Grist, MD  traMADol (ULTRAM) 50 MG tablet Take 1 tablet (50 mg total) by mouth every 6 (six) hours as needed for moderate pain. 10/28/14  Yes Theodoro Grist, MD     Review of Systems  Constitutional: Positive for appetite change (get full fast) and fatigue.  HENT: Positive for rhinorrhea. Negative for congestion and sore throat.   Eyes: Negative.   Respiratory: Positive for cough (dry cough). Negative for chest tightness and shortness of breath.   Cardiovascular: Negative for chest pain, palpitations and leg swelling.  Gastrointestinal: Negative.  Negative for abdominal pain and abdominal distention.  Endocrine: Negative.   Genitourinary: Negative.   Musculoskeletal: Negative.  Negative for muscle weakness.  Skin: Negative.   Allergic/Immunologic: Negative.   Neurological: Positive for dizziness (only when take 2 allergy medications), weakness and headaches (occasionally). Negative for light-headedness and numbness.  Hematological: Negative for adenopathy. Does not bruise/bleed easily.  Psychiatric/Behavioral: Positive for sleep disturbance (sleeping in lift  chair due to back). Negative for dysphoric mood. The patient is not nervous/anxious.        Objective:   Physical Exam  Constitutional: She is oriented to person, place, and time. She appears well-developed and well-nourished.  HENT:  Head: Normocephalic and atraumatic.  Eyes: Conjunctivae are normal. Pupils are equal, round, and reactive to light.  Neck: Normal range of motion. Neck supple.  Cardiovascular: Regular rhythm.  Tachycardia present.   Pulmonary/Chest: Effort normal. She has wheezes. She has no rales.  Abdominal: Soft. She exhibits no distension. There is no tenderness.  Musculoskeletal: She exhibits edema (trace amount in bilateral ankles). She exhibits no tenderness.  Neurological: She is alert and oriented to person, place, and time.  Skin: Skin is warm and dry.  Psychiatric: She has a normal mood and affect. Her behavior is normal. Thought content normal.  Nursing note and vitals reviewed.   BP 135/55 mmHg  Pulse 100  Resp 20  Ht 5\' 1"  (1.549 m)  Wt 108 lb (48.988 kg)  BMI 20.42 kg/m2  SpO2 100%       Assessment &  Plan:  1: Chronic heart failure with reduced ejection fraction- Patient presents with a new diagnosis of heart failure from a recent hospitalization and stent placement. She does weigh herself but only intermittently. Discussed the importance of weighing herself daily every morning after using the bathroom. She was also instructed to call for an overnight weight gain of >2 pounds or a weekly weight gain of >5 pounds. Weight chart given to her so that she can write her weight down and bring the chart back for review. She is not adding any salt to her food and she and her husband both review food labels. Written information provided regarding a 2000mg  sodium diet and the importance of following that. [facility] pharmacist came by and reviewed medications with the patient and her husband. Discussed possibly adding carvedilol for her heart failure as well as for her  tachycardia. Does have a history of hypotension. She does have a little bit of edema in her ankles and she says that she does wear TED hose but she wears them at bedtime with removal in the morning. Discussed how it would be more helpful if she wore them during the day and removed them at bedtime.  2: Weakness- Patient reports a generalized weakness and was supposed to start physical therapy in a few weeks. Is now more interested in pursuing cardiac rehab. Will make the referral and forward to Dr. Rockey Situ for his signature. 3: HTN- Blood pressure looks good today. Home readings looks good as well.  4: Stent placement- Currently taking plavix and tolerating that without known side effects. Sees Dr. Rockey Situ on 12/20/14.  Return in 1 month or sooner for any questions/problems before then.

## 2014-11-03 NOTE — Patient Instructions (Addendum)
Continue weighing daily and call for an overnight weight gain of > 2 pounds or a weekly weight gain of >5 pounds.  Low-Sodium Eating Plan Sodium raises blood pressure and causes water to be held in the body. Getting less sodium from food will help lower your blood pressure, reduce any swelling, and protect your heart, liver, and kidneys. We get sodium by adding salt (sodium chloride) to food. Most of our sodium comes from canned, boxed, and frozen foods. Restaurant foods, fast foods, and pizza are also very high in sodium. Even if you take medicine to lower your blood pressure or to reduce fluid in your body, getting less sodium from your food is important. WHAT IS MY PLAN? Most people should limit their sodium intake to 2,300 mg a day. Your health care provider recommends that you limit your sodium intake to __2000mg __ a day.  WHAT DO I NEED TO KNOW ABOUT THIS EATING PLAN? For the low-sodium eating plan, you will follow these general guidelines:  Choose foods with a % Daily Value for sodium of less than 5% (as listed on the food label).   Use salt-free seasonings or herbs instead of table salt or sea salt.   Check with your health care provider or pharmacist before using salt substitutes.   Eat fresh foods.  Eat more vegetables and fruits.  Limit canned vegetables. If you do use them, rinse them well to decrease the sodium.   Limit cheese to 1 oz (28 g) per day.   Eat lower-sodium products, often labeled as "lower sodium" or "no salt added."  Avoid foods that contain monosodium glutamate (MSG). MSG is sometimes added to [place] food and some canned foods.  Check food labels (Nutrition Facts labels) on foods to learn how much sodium is in one serving.  Eat more home-cooked food and less restaurant, buffet, and fast food.  When eating at a restaurant, ask that your food be prepared with less salt or none, if possible.  HOW DO I READ FOOD LABELS FOR SODIUM INFORMATION? The  Nutrition Facts label lists the amount of sodium in one serving of the food. If you eat more than one serving, you must multiply the listed amount of sodium by the number of servings. Food labels may also identify foods as:  Sodium free--Less than 5 mg in a serving.  Very low sodium--35 mg or less in a serving.  Low sodium--140 mg or less in a serving.  Light in sodium--50% less sodium in a serving. For example, if a food that usually has 300 mg of sodium is changed to become light in sodium, it will have 150 mg of sodium.  Reduced sodium--25% less sodium in a serving. For example, if a food that usually has 400 mg of sodium is changed to reduced sodium, it will have 300 mg of sodium. WHAT FOODS CAN I EAT? Grains Low-sodium cereals, including oats, puffed wheat and rice, and shredded wheat cereals. Low-sodium crackers. Unsalted rice and pasta. Lower-sodium bread.  Vegetables Frozen or fresh vegetables. Low-sodium or reduced-sodium canned vegetables. Low-sodium or reduced-sodium tomato sauce and paste. Low-sodium or reduced-sodium tomato and vegetable juices.  Fruits Fresh, frozen, and canned fruit. Fruit juice.  Meat and Other Protein Products Low-sodium canned tuna and salmon. Fresh or frozen meat, poultry, seafood, and fish. Lamb. Unsalted nuts. Dried beans, peas, and lentils without added salt. Unsalted canned beans. Homemade soups without salt. Eggs.  Dairy Milk. Soy milk. Ricotta cheese. Low-sodium or reduced-sodium cheeses. Yogurt.  Condiments Fresh  and dried herbs and spices. Salt-free seasonings. Onion and garlic powders. Low-sodium varieties of mustard and ketchup. Lemon juice.  Fats and Oils Reduced-sodium salad dressings. Unsalted butter.  Other Unsalted popcorn and pretzels.  The items listed above may not be a complete list of recommended foods or beverages. Contact your dietitian for more options. WHAT FOODS ARE NOT RECOMMENDED? Grains Instant hot cereals.  Bread stuffing, pancake, and biscuit mixes. Croutons. Seasoned rice or pasta mixes. Noodle soup cups. Boxed or frozen macaroni and cheese. Self-rising flour. Regular salted crackers. Vegetables Regular canned vegetables. Regular canned tomato sauce and paste. Regular tomato and vegetable juices. Frozen vegetables in sauces. Salted french fries. Olives. Angie Fava. Relishes. Sauerkraut. Salsa. Meat and Other Protein Products Salted, canned, smoked, spiced, or pickled meats, seafood, or fish. Bacon, ham, sausage, hot dogs, corned beef, chipped beef, and packaged luncheon meats. Salt pork. Jerky. Pickled herring. Anchovies, regular canned tuna, and sardines. Salted nuts. Dairy Processed cheese and cheese spreads. Cheese curds. Blue cheese and cottage cheese. Buttermilk.  Condiments Onion and garlic salt, seasoned salt, table salt, and sea salt. Canned and packaged gravies. Worcestershire sauce. Tartar sauce. Barbecue sauce. Teriyaki sauce. Soy sauce, including reduced sodium. Steak sauce. Fish sauce. Oyster sauce. Cocktail sauce. Horseradish. Regular ketchup and mustard. Meat flavorings and tenderizers. Bouillon cubes. Hot sauce. Tabasco sauce. Marinades. Taco seasonings. Relishes. Fats and Oils Regular salad dressings. Salted butter. Margarine. Ghee. Bacon fat.  Other Potato and tortilla chips. Corn chips and puffs. Salted popcorn and pretzels. Canned or dried soups. Pizza. Frozen entrees and pot pies.  The items listed above may not be a complete list of foods and beverages to avoid. Contact your dietitian for more information. Document Released: 08/10/2001 Document Revised: 02/23/2013 Document Reviewed: 12/23/2012 [facility] Patient Information 2015 [place], [place]. This information is not intended to replace advice given to you by your health care provider. Make sure you discuss any questions you have with your health care provider.

## 2014-11-04 ENCOUNTER — Encounter: Payer: Self-pay | Admitting: Nurse Practitioner

## 2014-11-04 ENCOUNTER — Telehealth: Payer: Self-pay | Admitting: *Deleted

## 2014-11-04 NOTE — Telephone Encounter (Signed)
Please see note below. 

## 2014-11-04 NOTE — Telephone Encounter (Signed)
Pt c/o medication issue:  1. Name of Medication: Lipitor   2. How are you currently taking this medication (dosage and times per day)? 1 at night  3. Are you having a reaction (difficulty breathing--STAT)? no  4. What is your medication issue? Has a runny nose. She would like to cut it in half. She read online about this. Please advise. She also stated that if we can't call her today that she would just cut it in half.

## 2014-11-08 ENCOUNTER — Telehealth: Payer: Self-pay | Admitting: *Deleted

## 2014-11-09 ENCOUNTER — Ambulatory Visit: Payer: PPO

## 2014-11-09 NOTE — Telephone Encounter (Signed)
Spoke w/ pt.  She states that she thinks her sx are r/t furosemide. She states that she was advised by the HF clinic to hold for a few days and see if her sx improve.  She asks if she can stop taking it altogether.  Advised her that I cannot authorize that, but to have her keep a close eye on her wt, swelling and SOB. She states that she not only does that, but monitors her HR regularly.  Asked her to call back w/ any further questions or concerns.

## 2014-11-09 NOTE — Telephone Encounter (Signed)
Okay to cut Lipitor 80 mg in half Typically we do not see any nose issues, runny nose etc. Possibly from other medication or allergies?

## 2014-11-11 ENCOUNTER — Encounter: Payer: PPO | Admitting: Physical Therapy

## 2014-11-11 ENCOUNTER — Telehealth: Payer: Self-pay

## 2014-11-11 NOTE — Telephone Encounter (Signed)
Does not sound like a side effect Plavix would do as Plavix makes her platelets less "sticky", like aspirin booster  Okay to change to different blood thinner such as brilinta 90 mg twice a day Or effient daily Would stay on low-dose aspirin 81 mg daily

## 2014-11-11 NOTE — Telephone Encounter (Signed)
Pt states her Plavix is making her dizzy, nauseated, and cannot eat. States she researched on the internet and concluded that it was from the Plavix. Please call.

## 2014-11-12 ENCOUNTER — Telehealth: Payer: Self-pay | Admitting: Physician Assistant

## 2014-11-12 NOTE — Telephone Encounter (Signed)
     Patient recently had two drug eluding stents placed in 10/2014. She was initially placed on DAPT with ASA and Brilinta. She could not tolerate Brilinta due to SOB so she was switched to plavix. She called the on call provider today to report that plavix is causing her to feel dizzy and "not be able to lift her head up she feels so terrible." She read this can be caused by plavix online. She asks if she can stop taking it or take half. I advised her to keep taking her plavix and call to make an appointment in the office next week. I went over that is very important that she take both her ASA and plavix everyday with recent DES placement and that non compliance could be very dangerous as she is at risk for stent thrombosis. She seems very irritated by this but understands.    Angelena Form PA-C  [facility]

## 2014-11-14 ENCOUNTER — Encounter: Payer: PPO | Admitting: Physical Therapy

## 2014-11-14 ENCOUNTER — Telehealth: Payer: Self-pay

## 2014-11-14 NOTE — Telephone Encounter (Signed)
Please see PA note 11/12/14.

## 2014-11-14 NOTE — Telephone Encounter (Signed)
Pt call was transferred from the [place] office, pt asks if it is ok to take her Plavix at night. Per Dede Query, RN, pt is ok to take Plavix at night.

## 2014-11-15 ENCOUNTER — Ambulatory Visit (INDEPENDENT_AMBULATORY_CARE_PROVIDER_SITE_OTHER): Payer: PPO | Admitting: Nurse Practitioner

## 2014-11-15 VITALS — BP 122/65 | HR 92 | Temp 97.9°F | Resp 14 | Ht 61.0 in | Wt 103.0 lb

## 2014-11-15 DIAGNOSIS — I10 Essential (primary) hypertension: Secondary | ICD-10-CM | POA: Diagnosis not present

## 2014-11-15 DIAGNOSIS — I25119 Atherosclerotic heart disease of native coronary artery with unspecified angina pectoris: Secondary | ICD-10-CM | POA: Diagnosis not present

## 2014-11-15 DIAGNOSIS — I5023 Acute on chronic systolic (congestive) heart failure: Secondary | ICD-10-CM

## 2014-11-15 DIAGNOSIS — R531 Weakness: Secondary | ICD-10-CM

## 2014-11-15 DIAGNOSIS — I951 Orthostatic hypotension: Secondary | ICD-10-CM

## 2014-11-15 DIAGNOSIS — I420 Dilated cardiomyopathy: Secondary | ICD-10-CM | POA: Diagnosis not present

## 2014-11-15 MED ORDER — ONDANSETRON 4 MG PO TBDP: 4.0000 mg | ORAL_TABLET | Freq: Three times a day (TID) | ORAL | Status: DC | PRN

## 2014-11-15 NOTE — Patient Instructions (Addendum)
Follow up with Dr. Rockey Situ and Darylene Price ([facility]).   Try 1/4th of a tramadol.

## 2014-11-15 NOTE — Progress Notes (Signed)
Patient ID: [name], female    DOB: 07-Sep-1940  Age: 74 y.o. MRN: [id number]  CC: Congestive Heart Failure   HPI [name] presents for hospital follow up with CC of SOB and dizziness.   1) 10/25/14 Admission to hospital  Diagnoses: SOB, Weakness, Cardiomyopathy   Acute respiratory failure w/ hypoxemia, CHF- systolic  Pulmonary edema, congestive dilated cardiomyopathy, CAD with stent placement and orthostatic hypotension.   EF 20%  Stents placed and pt on Plavix Left Heart Cath with Dr. Rockey Situ SOB improving after lasix on board  Weakness/fatigue still present  Dizzy 5-6 x a day, standing up slowly, denies falls Cardiac Rehab- pursuing  Potassium/Magnesium supplements   History [name] has a past medical history of UTI (lower urinary tract infection); Arthritis; Osteoporosis; Hepatitis C; and Chronic systolic CHF (congestive heart failure).   She has past surgical history that includes Back surgery (07/14); Cardiac catheterization (Bilateral, 10/27/2014); and Cardiac catheterization (N/A, 10/27/2014).   Her family history includes Arthritis in her mother; Diabetes in her father; Heart disease in her father.She reports that she has quit smoking. She has never used smokeless tobacco. She reports that she does not drink alcohol or use illicit drugs.  Outpatient Prescriptions Prior to Visit  Medication Sig Dispense Refill  . aspirin 81 MG chewable tablet Chew 1 tablet (81 mg total) by mouth daily. 30 tablet 6  . atorvastatin (LIPITOR) 80 MG tablet Take 1 tablet (80 mg total) by mouth daily at 6 PM. 30 tablet 6  . Calcium-Vitamin A-Vitamin D (LIQUID CALCIUM PO) Take by mouth. 1 tablespoon daily    . Chlorpheniramine Maleate (ALLERGY PO) Take 1 tablet by mouth daily.    . cholecalciferol (VITAMIN D) 1000 UNITS tablet Take 1,000 Units by mouth daily.    . clonazePAM (KLONOPIN) 0.5 MG tablet Take 1 tablet (0.5 mg total) by mouth at bedtime. 30 tablet 0  . clopidogrel (PLAVIX) 75 MG  tablet Take 1 tablet (75 mg total) by mouth daily. 30 tablet 11  . denosumab (PROLIA) 60 MG/ML SOLN injection Inject 60 mg into the skin every 6 (six) months. 1 Syringe 0  . docusate sodium (COLACE) 100 MG capsule Take 1 capsule (100 mg total) by mouth 2 (two) times daily. 10 capsule 0  . furosemide (LASIX) 20 MG tablet Take 1 tablet (20 mg total) by mouth every other day. 30 tablet 6  . lisinopril (PRINIVIL,ZESTRIL) 2.5 MG tablet Take 2 tablets (5 mg total) by mouth at bedtime. 30 tablet 6  . Magnesium 250 MG TABS Take 1 tablet by mouth daily.    . Multiple Vitamin (MULTIVITAMIN) tablet Take 1 tablet by mouth daily.    . potassium chloride (K-DUR) 10 MEQ tablet Take 1 tablet (10 mEq total) by mouth every other day. 30 tablet 6  . senna (SENOKOT) 8.6 MG TABS tablet Take 1 tablet (8.6 mg total) by mouth 2 (two) times daily. 120 each 0  . traMADol (ULTRAM) 50 MG tablet Take 1 tablet (50 mg total) by mouth every 6 (six) hours as needed for moderate pain. 90 tablet 0  . ondansetron (ZOFRAN ODT) 4 MG disintegrating tablet Take 1 tablet (4 mg total) by mouth every 8 (eight) hours as needed for nausea or vomiting. 20 tablet 0  . oxyCODONE-acetaminophen (PERCOCET) 10-325 MG per tablet Take 1 tablet by mouth every 6 (six) hours as needed for pain. (Patient not taking: Reported on 11/15/2014) 40 tablet 0   No facility-administered medications prior to visit.  ROS Review of Systems  Constitutional: Positive for fatigue. Negative for fever, chills and diaphoresis.  Respiratory: Negative for chest tightness, shortness of breath and wheezing.   Cardiovascular: Negative for chest pain, palpitations and leg swelling.  Gastrointestinal: Negative for nausea, vomiting and diarrhea.  Skin: Negative for rash.  Neurological: Positive for dizziness and weakness. Negative for numbness and headaches.  Psychiatric/Behavioral: The patient is not nervous/anxious.     Objective:  BP 122/65 mmHg  Pulse 92   Temp(Src) 97.9 F (36.6 C)  Resp 14  Ht 5\' 1"  (1.549 m)  Wt 103 lb (46.72 kg)  BMI 19.47 kg/m2  SpO2 97%  Physical Exam  Constitutional: She is oriented to person, place, and time. She appears well-developed and well-nourished. No distress.  Thin habitus  HENT:  Head: Normocephalic and atraumatic.  Right Ear: External ear normal.  Left Ear: External ear normal.  Eyes: EOM are normal. Pupils are equal, round, and reactive to light. Right eye exhibits no discharge. Left eye exhibits no discharge. No scleral icterus.  Cardiovascular: Normal rate, regular rhythm and intact distal pulses.   Pulmonary/Chest: Effort normal and breath sounds normal. No respiratory distress. She has no wheezes. She has no rales. She exhibits no tenderness.  Improved lung sounds   Neurological: She is alert and oriented to person, place, and time. No cranial nerve deficit. She exhibits normal muscle tone. Coordination normal.  Skin: Skin is warm and dry. No rash noted. She is not diaphoretic.  Psychiatric: She has a normal mood and affect. Her behavior is normal. Judgment and thought content normal.   Assessment & Plan:   [name] was seen today for congestive heart failure.  Diagnoses and all orders for this visit:  Acute on chronic systolic congestive heart failure  Essential hypertension  Congestive dilated cardiomyopathy  Coronary artery disease involving native coronary artery of native heart with angina pectoris  Orthostatic hypotension  Weakness generalized  Other orders -     ondansetron (ZOFRAN ODT) 4 MG disintegrating tablet; Take 1 tablet (4 mg total) by mouth every 8 (eight) hours as needed for nausea or vomiting.   I have discontinued [name]'s oxyCODONE-acetaminophen. I am also having her maintain her multivitamin, Magnesium, cholecalciferol, Calcium-Vitamin A-Vitamin D (LIQUID CALCIUM PO), denosumab, clonazePAM, Chlorpheniramine Maleate (ALLERGY PO), traMADol, aspirin, atorvastatin,  docusate sodium, lisinopril, senna, furosemide, potassium chloride, clopidogrel, and ondansetron.  Meds ordered this encounter  Medications  . ondansetron (ZOFRAN ODT) 4 MG disintegrating tablet    Sig: Take 1 tablet (4 mg total) by mouth every 8 (eight) hours as needed for nausea or vomiting.    Dispense:  20 tablet    Refill:  2    Order Specific Question:  Supervising Provider    Answer:  Crecencio Mc [[id number]]     Follow-up: Return in about 6 weeks (around 12/27/2014).

## 2014-11-15 NOTE — Progress Notes (Signed)
Pre visit review using our clinic review tool, if applicable. No additional management support is needed unless otherwise documented below in the visit note. 

## 2014-11-17 ENCOUNTER — Encounter: Payer: PPO | Admitting: Physical Therapy

## 2014-11-21 ENCOUNTER — Telehealth: Payer: Self-pay | Admitting: Family

## 2014-11-21 NOTE — Telephone Encounter (Signed)
Patient called concerned about her continuing overall weakness and her inability to "get back where I was". She received the information regarding cardiac rehab but after reading it, she did not think she would be able to attend due to the broken bones in her back. She is going to speak to her PCP or cardiologist about possible physical therapy instead. Discussed how people can lose muscle strength very quickly and that it can take quite some time before regaining it back. She is to let us know if the weakness worsens or changes any and that, otherwise, what she is feeling is probably the normal progression of this illness. Patient expressed appreciation about answering her questions and giving her guidance on physical therapy. She is due to be seen in the [facility] in a couple of weeks.

## 2014-11-22 ENCOUNTER — Encounter: Payer: Self-pay | Admitting: Nurse Practitioner

## 2014-11-23 ENCOUNTER — Other Ambulatory Visit (INDEPENDENT_AMBULATORY_CARE_PROVIDER_SITE_OTHER): Payer: PPO

## 2014-11-23 ENCOUNTER — Other Ambulatory Visit: Payer: Self-pay | Admitting: Nurse Practitioner

## 2014-11-23 ENCOUNTER — Telehealth: Payer: Self-pay | Admitting: *Deleted

## 2014-11-23 ENCOUNTER — Telehealth: Payer: Self-pay | Admitting: Nurse Practitioner

## 2014-11-23 DIAGNOSIS — I5022 Chronic systolic (congestive) heart failure: Secondary | ICD-10-CM

## 2014-11-23 DIAGNOSIS — I5041 Acute combined systolic (congestive) and diastolic (congestive) heart failure: Secondary | ICD-10-CM

## 2014-11-23 DIAGNOSIS — N179 Acute kidney failure, unspecified: Secondary | ICD-10-CM | POA: Diagnosis not present

## 2014-11-23 LAB — RENAL FUNCTION PANEL
ALBUMIN: 4.4 g/dL (ref 3.5–5.2)
BUN: 22 mg/dL (ref 6–23)
CALCIUM: 10.3 mg/dL (ref 8.4–10.5)
CO2: 30 mEq/L (ref 19–32)
CREATININE: 0.93 mg/dL (ref 0.40–1.20)
Chloride: 104 mEq/L (ref 96–112)
GFR: 62.78 mL/min (ref >60.00)
GLUCOSE: 116 mg/dL — AB (ref 70–99)
Phosphorus: 3.8 mg/dL (ref 2.3–4.6)
Potassium: 5.4 mEq/L — ABNORMAL HIGH (ref 3.5–5.1)
Sodium: 141 mEq/L (ref 135–145)

## 2014-11-23 LAB — BRAIN NATRIURETIC PEPTIDE: PRO B NATRI PEPTIDE: 3183 pg/mL — AB (ref 0.0–100.0)

## 2014-11-23 NOTE — Telephone Encounter (Signed)
Please enter lab orders.

## 2014-11-23 NOTE — Telephone Encounter (Signed)
Good morning! Pt states yesterday that she needed to sch a appt to get lab work after her appt. I was waiting to see if labs would be entered in. Need orders to sch pt. Thank You!

## 2014-11-23 NOTE — Telephone Encounter (Signed)
Orders are in

## 2014-11-23 NOTE — Telephone Encounter (Signed)
Pt on plavix- we do not check levels for that medication. Thanks!

## 2014-11-23 NOTE — Telephone Encounter (Signed)
Pt states she stated blood thinner and she wants it checked? Collect tube just in case

## 2014-11-24 ENCOUNTER — Encounter: Payer: Self-pay | Admitting: Nurse Practitioner

## 2014-11-25 ENCOUNTER — Encounter: Payer: Self-pay | Admitting: Nurse Practitioner

## 2014-11-25 ENCOUNTER — Telehealth: Payer: Self-pay | Admitting: Nurse Practitioner

## 2014-11-25 NOTE — Telephone Encounter (Signed)
We've gotten what we wanted. So yes she is good. Thanks!

## 2014-11-25 NOTE — Assessment & Plan Note (Signed)
BP Readings from Last 3 Encounters:  11/15/14 122/65  11/03/14 135/55  10/28/14 112/71   Stable currently will follow

## 2014-11-25 NOTE — Telephone Encounter (Signed)
Good morning! Happy Friday! I checked pt chart to sch for lab work. No orders showing. Let me know when orders are avail. Thank You so much!

## 2014-11-25 NOTE — Assessment & Plan Note (Signed)
Uncontrolled. Pt still having episodes daily. Discussed possible etiologies. Will not place on Meclizine. Asked pt to move positions slowly

## 2014-11-25 NOTE — Assessment & Plan Note (Signed)
Seeing Dr. Rockey Situ for condition. Will follow

## 2014-11-25 NOTE — Telephone Encounter (Signed)
Good morning! She already had labs done. What is this in reference to?

## 2014-11-25 NOTE — Telephone Encounter (Signed)
Ok! Pt stated when she left from her appt this week, she stated she needed to sch for lab work. :) So she's good and does not need labs correct?

## 2014-11-25 NOTE — Assessment & Plan Note (Addendum)
Hopefully, pt will continue to improve with cardiac rehab. Asked pt to try 1/4th of tramadol to see if there is improvement in weakness/dizziness.

## 2014-11-25 NOTE — Assessment & Plan Note (Addendum)
Seeing Kelly Price, FNP at [facility] clinic. Dr. Rockey Situ for cardiology. Lasix was helpful for pt.

## 2014-11-25 NOTE — Assessment & Plan Note (Signed)
Stable on Plavix after stents

## 2014-11-29 ENCOUNTER — Ambulatory Visit: Payer: PPO | Admitting: Cardiovascular Disease

## 2014-12-01 ENCOUNTER — Encounter: Payer: Self-pay | Admitting: Nurse Practitioner

## 2014-12-05 ENCOUNTER — Ambulatory Visit: Payer: PPO | Admitting: Family

## 2014-12-12 ENCOUNTER — Encounter: Payer: Self-pay | Admitting: Nurse Practitioner

## 2014-12-12 ENCOUNTER — Other Ambulatory Visit: Payer: Self-pay | Admitting: Nurse Practitioner

## 2014-12-12 ENCOUNTER — Telehealth: Payer: Self-pay | Admitting: *Deleted

## 2014-12-12 MED ORDER — HYDROCODONE-ACETAMINOPHEN 5-325 MG PO TABS: 1.0000 | ORAL_TABLET | Freq: Two times a day (BID) | ORAL | Status: DC

## 2014-12-12 NOTE — Telephone Encounter (Signed)
Patient stated that she was told that her prescription(vicodin) would be ready today for pick up, patient has requested an update.

## 2014-12-12 NOTE — Telephone Encounter (Signed)
It is in the file up front

## 2014-12-13 ENCOUNTER — Encounter: Payer: Self-pay | Admitting: Nurse Practitioner

## 2014-12-20 ENCOUNTER — Encounter: Payer: PPO | Admitting: Cardiovascular Disease

## 2014-12-23 ENCOUNTER — Ambulatory Visit (INDEPENDENT_AMBULATORY_CARE_PROVIDER_SITE_OTHER): Payer: PPO | Admitting: Physician Assistant

## 2014-12-23 ENCOUNTER — Encounter: Payer: Self-pay | Admitting: Physician Assistant

## 2014-12-23 VITALS — BP 114/80 | HR 96 | Ht 60.0 in | Wt 104.5 lb

## 2014-12-23 DIAGNOSIS — I10 Essential (primary) hypertension: Secondary | ICD-10-CM | POA: Diagnosis not present

## 2014-12-23 DIAGNOSIS — I255 Ischemic cardiomyopathy: Secondary | ICD-10-CM

## 2014-12-23 DIAGNOSIS — I5022 Chronic systolic (congestive) heart failure: Secondary | ICD-10-CM | POA: Diagnosis not present

## 2014-12-23 DIAGNOSIS — Z9889 Other specified postprocedural states: Secondary | ICD-10-CM

## 2014-12-23 DIAGNOSIS — I25119 Atherosclerotic heart disease of native coronary artery with unspecified angina pectoris: Secondary | ICD-10-CM | POA: Diagnosis not present

## 2014-12-23 DIAGNOSIS — I2510 Atherosclerotic heart disease of native coronary artery without angina pectoris: Secondary | ICD-10-CM

## 2014-12-23 DIAGNOSIS — M549 Dorsalgia, unspecified: Secondary | ICD-10-CM

## 2014-12-23 DIAGNOSIS — G8929 Other chronic pain: Secondary | ICD-10-CM

## 2014-12-23 DIAGNOSIS — R5382 Chronic fatigue, unspecified: Secondary | ICD-10-CM

## 2014-12-23 MED ORDER — SACUBITRIL-VALSARTAN 24-26 MG PO TABS: 1.0000 | ORAL_TABLET | Freq: Two times a day (BID) | ORAL | Status: DC

## 2014-12-23 NOTE — Progress Notes (Signed)
Cardiology Hospital Follow Up Note:   Date of Encounter: 12/23/2014  ID: LEKETA NEUJAHR, DOB 08-19-1940, MRN [id number]  PCP: Rubbie Battiest, NP Primary Cardiologist: Dr. Rockey Situ, MD  Chief Complaint  Patient presents with  . other    F/u cardiac cath c/o edema ankles left leg red. Meds reviewed verbally with pt.     HPI:  74 year old female with history of hepatitis C, osteoporosis, arthritis, chronic back pain s/p multiple fusions, and chronic UTI's who presents for hospital follow up after admission to [facility] 8/23 to 8/26 for new onset acute systolic CHF, ischemic cardiomyopathy, acute respiratory distress with hypoxia, and newly diagnosed CAD s/p PCI/DES to mid RCA and prox LAD.    She had previously been seeing her PCP for increased fatigue and SOB in the early half of August that dated back 3 weeks prior to her office visit at that time. She noted increased lower extremity edema, orthopnea, PND, early satiety over this time span. Her weight had actually been declining over this time span 2/2 decreased appetite and early satiety. CBC, B12, and chemistries were ok. She has been treated for her osteoporosis with Prolia injections and initially thought her symptoms were related to this, though her last injection was 6 months prior to her admission. She was later set up an appointment with Dr. Revonda Standard via phone call from patient. Unfortunately, she was admitted to [facility] in August with acute respiraotry destress with hypoxia before this outpatient appointment could be completed.    Upon her arrival to [facility] she was noted to be hypoxic with pO@ of 59, O2 saturations of 89% on room air, CXR showed cardiomegaly, pulmonary vascular congestion, effusions, and CHF. Echo showed EF of 20%, akinesis of the inferior, inferoseptal, distal anterior, and apical walls. There was hypokinesis elsewhere. Cavity size was mildly dilated. Wall thickness was normal. Mild aortic regurgitation. Mild to moderate  mitral regurgitation. Left atrium was mildly dilated. Right ventricle systolic was was moderate to severely reduced. A small pericardial effusion was seen. Troponin was found to be 0.05-->0.06. BNP was 3423. She was started on IV Lasix 20 mg q 12 hours, topical nitro, aspirin, and continued on lisinopril 10 mg, and metoprolol 25 mg bid. She underwent cardiac cath on 8/25 that showed ostial LAD 30%, prox LAD 90%, ostial RCA 30%, mid RCA 90%, ostial Ramus 50%. She underwent successful PCI/DES to the proximal LAD and mid RCA with Xience DES. She was started on aspirin and Brilinta. Unfortunately, she called and stated she could not tolerated Brilinta 2/2 SOB. She was changed to Plavix. She called back again stating she felt like Plavix was making her dizzy and she wanted to either stop this medication or take only 1/2. She was advised to continue the whole medication along with aspirin. She did not like this answer. She has contacted her PCP regarding Plavix as well with concerns.    She continues to note fatigue, though this has been present x 3-4 years. She attributes this to her previous back issues and generalized physical deconditioning. She denies any chest pain, increased SOB, palpitations, diaphoresis, nausea, vomiting, presyncope, or syncope. She stopped taking her lisinopril shortly after being discharged from [facility] back in August has "my blood pressure was ok." She provides BP readings today that show readings ranging from the 1-teens to [id number] systolic. She has continued on aspirin 81 mg daily and Plavix 75 mg daily. She reports tolerating them both without issues. She does not have much  of an appetite and eats small frequent meals approximately 6 times daily. She does not drink more than 2L of fluids daily, does not apply salt to her foods, and occasionally eats out at restaurants.       Past Medical History  Diagnosis Date  . UTI (lower urinary tract infection)   . Arthritis   . Osteoporosis   .  Hepatitis C   . Chronic systolic CHF (congestive heart failure) ([place])     a. echo 10/2014: EF 20%, AK inf, inf-sep, distal ant, & apical walls, HK elsewhere. Cavity size mildly dilated. Wall thickness nl. mild AI. Mild to mod MR. LA mildly dilated. RV sys fxn mod to severely reduced. small pericardial effusion was ID'd.   Marland Kitchen CAD (coronary artery disease)     a. cardiac cath 10/2014: ostial LAD 30%, prox LAD 90% s/p PCI/DES, ostial RCA 30%, mid RCA 90% s/p PCI/DES, ostial Ramus 50%  . Ischemic cardiomyopathy   . Chronic back pain   . Chronic fatigue   : Past Surgical History  Procedure Laterality Date  . Back surgery  07/14    T10, L1  . Cardiac catheterization Bilateral 10/27/2014    Procedure: Left Heart Cath and Coronary Angiography;  Surgeon: Minna Merritts, MD;  Location: [facility];  Service: Cardiovascular;  Laterality: Bilateral;  . Cardiac catheterization N/A 10/27/2014    Procedure: Coronary Stent Intervention;  Surgeon: Wellington Hampshire, MD;  Location: [place] [facility];  Service: Cardiovascular;  Laterality: N/A;  : Family History  Problem Relation Age of Onset  . Arthritis Mother   . Heart disease Father     passed at 21  . Diabetes Father   :  reports that she has quit smoking. She has never used smokeless tobacco. She reports that she does not drink alcohol or use illicit drugs.:   Allergies:  Allergies  Allergen Reactions  . Sugar-Protein-Starch Nausea And Vomiting    Only to sugar     Home Medications:  Current Outpatient Prescriptions  Medication Sig Dispense Refill  . aspirin 81 MG chewable tablet Chew 1 tablet (81 mg total) by mouth daily. 30 tablet 6  . atorvastatin (LIPITOR) 80 MG tablet Take 1 tablet (80 mg total) by mouth daily at 6 PM. 30 tablet 6  . Calcium-Vitamin A-Vitamin D (LIQUID CALCIUM PO) Take by mouth. 1 tablespoon daily    . Chlorpheniramine Maleate (ALLERGY PO) Take 1 tablet by mouth daily.    . cholecalciferol (VITAMIN D)  1000 UNITS tablet Take 1,000 Units by mouth daily.    . clonazePAM (KLONOPIN) 0.5 MG tablet Take 1 tablet (0.5 mg total) by mouth at bedtime. 30 tablet 0  . clopidogrel (PLAVIX) 75 MG tablet Take 1 tablet (75 mg total) by mouth daily. 30 tablet 11  . denosumab (PROLIA) 60 MG/ML SOLN injection Inject 60 mg into the skin every 6 (six) months. 1 Syringe 0  . docusate sodium (COLACE) 100 MG capsule Take 1 capsule (100 mg total) by mouth 2 (two) times daily. (Patient taking differently: Take 100 mg by mouth as needed. ) 10 capsule 0  . furosemide (LASIX) 20 MG tablet Take 1 tablet (20 mg total) by mouth every other day. 30 tablet 6  . HYDROcodone-acetaminophen (NORCO/VICODIN) 5-325 MG tablet Take 1 tablet by mouth 2 (two) times daily. (Patient taking differently: Take 1 tablet by mouth as needed. ) 60 tablet 0  . lisinopril (PRINIVIL,ZESTRIL) 2.5 MG tablet Take 2 tablets (5 mg total)  by mouth at bedtime. 30 tablet 6  . Magnesium 250 MG TABS Take 1 tablet by mouth daily.    . Multiple Vitamin (MULTIVITAMIN) tablet Take 1 tablet by mouth daily.    . ondansetron (ZOFRAN ODT) 4 MG disintegrating tablet Take 1 tablet (4 mg total) by mouth every 8 (eight) hours as needed for nausea or vomiting. 20 tablet 2  . potassium chloride (K-DUR) 10 MEQ tablet Take 1 tablet (10 mEq total) by mouth every other day. (Patient taking differently: Take 10 mEq by mouth daily. ) 30 tablet 6  . senna (SENOKOT) 8.6 MG TABS tablet Take 1 tablet (8.6 mg total) by mouth 2 (two) times daily. (Patient taking differently: Take 1 tablet by mouth at bedtime as needed. ) 120 each 0  . sacubitril-valsartan (ENTRESTO) 24-26 MG Take 1 tablet by mouth 2 (two) times daily. 60 tablet 11   No current facility-administered medications for this visit.     Review of Systems:  Review of Systems  Constitutional: Positive for weight loss and malaise/fatigue. Negative for fever, chills and diaphoresis.  HENT: Negative for congestion.   Eyes:  Negative for discharge and redness.  Respiratory: Positive for shortness of breath. Negative for cough, hemoptysis, sputum production and wheezing.   Cardiovascular: Positive for leg swelling. Negative for chest pain, palpitations, orthopnea, claudication and PND.       Intermittent left lower extremity swelling   Gastrointestinal: Negative for heartburn, nausea, vomiting and abdominal pain.  Musculoskeletal: Positive for myalgias, back pain and joint pain. Negative for falls and neck pain.  Skin: Negative for rash.  Neurological: Positive for dizziness and weakness. Negative for tingling, tremors, sensory change, speech change, focal weakness and loss of consciousness.       Dizziness associated with narcotic pain medication usage  Endo/Heme/Allergies: Does not bruise/bleed easily.  Psychiatric/Behavioral: Negative for memory loss and substance abuse. The patient is nervous/anxious and has insomnia.      Physical Exam:  Blood pressure 114/80, pulse 96, height 5' (1.524 m), weight 104 lb 8 oz (47.401 kg). BMI: Body mass index is 20.41 kg/(m^2). General: Pleasant, NAD. Frail appearing.  Psych: Normal affect. Responds to questions with normal affect.  Neuro: Alert and oriented X 3. Moves all extremities spontaneously. HEENT: Normocephalic, atraumatic. EOM intact bilaterally. Sclera anicteric.  Neck: Trachea midline. Supple without bruits or JVD. Lungs:  Respirations regular and unlabored, CTA bilaterally without wheezing, crackles, or rhonchi.  Heart: RRR, normal s3, s4. No murmurs, rubs, or gallops.  Abdomen: Soft, non-tender, non-distended, BS + x 4.  Extremities: No clubbing or cyanosis. No appreciable edema along either lower extremity. DP/PT/Radials 2+ and equal bilaterally.   Accessory Clinical Findings:  EKG: NSR, 96 bpm, LBBB  Recent Labs: 10/04/2014: Magnesium 2.2 10/25/2014: ALT 29; B Natriuretic Peptide 3423.0* 10/26/2014: TSH 1.951 10/28/2014: Hemoglobin 12.7; Platelets  180 11/23/2014: BUN 22; Creatinine, Ser 0.93; Potassium 5.4*; Pro B Natriuretic peptide (BNP) 3183.0*; Sodium 141     Component Value Date/Time   CHOL 136 10/26/2014 1005   TRIG 100 10/26/2014 1005   HDL 38* 10/26/2014 1005   CHOLHDL 3.6 10/26/2014 1005   VLDL 20 10/26/2014 1005   LDLCALC 78 10/26/2014 1005    Weights: Wt Readings from Last 3 Encounters:  12/23/14 104 lb 8 oz (47.401 kg)  11/15/14 103 lb (46.72 kg)  11/03/14 108 lb (48.988 kg)    CrCl cannot be calculated (Patient has no serum creatinine result on file.).   Other studies Reviewed: Additional studies/ records that  were reviewed today include: [place] admission.  Assessment & Plan:  1. CAD s/p PCI/DES x 2 as above: -Currently without any symptoms of angina -Continue aspirin 81 mg daily and Plavix 75 mg daily for at least 12 months from the date of her PCI/DES (10/27/2014) -Continue Lipitor 80 mg -See below comments regarding beta blocker (previously not on BB 2/2 soft BP) -Recommended cardiac rehab, she declined stating her chronic back pain limits her. I asked her to keep an open mind about this  2. Ischemic cardiomyopathy/chronic systolic CHF: -EF 123456 on echo at the end of August -She appears euvolemic on exam today -She has not taken lisinopril since shortly after being discharged, as she reports her BP was ok. I explained to her this medication was being used for her heart failure as well -Given that she has not been on an ACEi since the end of August/first of September I will go ahead and start her on Entresto 24/26 mg bid as she has plenty of blood pressure room. I doubt we will be able to titrate her up to higher doses at this time though -Would ideally like to start her on a low dose beta blocker as well, BP permitting -Consider spironolactone at follow up, if able -She will need follow up echo at the end of November to evaluate LV function. If her EF remains below 35% at that time she would be a candidate  for AICD at that time. If EF is greater than 35% she would no longer need AICD. She has been scheduled for echo  3. History of acute respiratory distress with hypoxia: -Oxygen saturations at home have been running in the 90's% on room air  4. Chronic fatigue: -Long standing issue -Cannot rule out components of #1 and #2 -This was discussed with patient -Recommend cardiac rehab as above, patient declined  5. Chronic back pain: -Long standing issue -On Norco per PCP, leading to dizziness per patient     Dispo: -Follow up with Dr. Rockey Situ, MD in 3 months  Current medicines are reviewed at length with the patient today.  The patient did not have any concerns regarding medicines.   Christell Faith, PA-C [place], [place] [address] [phone] [place] 12/23/2014, 3:39 PM

## 2014-12-23 NOTE — Patient Instructions (Signed)
Medication Instructions:  Your physician has recommended you make the following change in your medication:  START taking Entresto 24-26 twice per day   Labwork: none  Testing/Procedures: Your physician has requested that you have an echocardiogram at the end of November. Echocardiography is a painless test that uses sound waves to create images of your heart. It provides your doctor with information about the size and shape of your heart and how well your heart's chambers and valves are working. This procedure takes approximately one hour. There are no restrictions for this procedure.    Follow-Up: Your physician recommends that you schedule a follow-up appointment in: three months with Dr. Rockey Situ or Christell Faith, PA-C   Any Other Special Instructions Will Be Listed Below (If Applicable).  Echocardiogram An echocardiogram, or echocardiography, uses sound waves (ultrasound) to produce an image of your heart. The echocardiogram is simple, painless, obtained within a short period of time, and offers valuable information to your health care provider. The images from an echocardiogram can provide information such as:  Evidence of coronary artery disease (CAD).  Heart size.  Heart muscle function.  Heart valve function.  Aneurysm detection.  Evidence of a past heart attack.  Fluid buildup around the heart.  Heart muscle thickening.  Assess heart valve function. LET [facility] CARE PROVIDER KNOW ABOUT:  Any allergies you have.  All medicines you are taking, including vitamins, herbs, eye drops, creams, and over-the-counter medicines.  Previous problems you or members of your family have had with the use of anesthetics.  Any blood disorders you have.  Previous surgeries you have had.  Medical conditions you have.  Possibility of pregnancy, if this applies. BEFORE THE PROCEDURE  No special preparation is needed. Eat and drink normally.  PROCEDURE   In order to produce  an image of your heart, gel will be applied to your chest and a wand-like tool (transducer) will be moved over your chest. The gel will help transmit the sound waves from the transducer. The sound waves will harmlessly bounce off your heart to allow the heart images to be captured in real-time motion. These images will then be recorded.  You may need an IV to receive a medicine that improves the quality of the pictures. AFTER THE PROCEDURE You may return to your normal schedule including diet, activities, and medicines, unless your health care provider tells you otherwise.   This information is not intended to replace advice given to you by your health care provider. Make sure you discuss any questions you have with your health care provider.   Document Released: 02/16/2000 Document Revised: 03/11/2014 Document Reviewed: 10/26/2012 Elsevier Interactive Patient Education [employer].

## 2014-12-27 ENCOUNTER — Telehealth: Payer: Self-pay

## 2014-12-27 ENCOUNTER — Ambulatory Visit: Payer: PPO | Admitting: Nurse Practitioner

## 2014-12-27 NOTE — Telephone Encounter (Signed)
Prior auth for [place] 24-26 sent to [facility].

## 2014-12-29 ENCOUNTER — Telehealth: Payer: Self-pay | Admitting: Physician Assistant

## 2014-12-29 NOTE — Telephone Encounter (Signed)
S/w pt who started Entresto 24-26mg  after 10/21 OV. States Thurmond Butts told her to discontinue med if BP 90/60 and to call the office. She reports BP last night 91/61 therefore she did not take the medication.  This morning BP 115/71. Did not take morning dose for fear it would drop BP too low. Informed pt I would forward to [facility] for recommendation.

## 2014-12-29 NOTE — Telephone Encounter (Signed)
°  Pt c/o BP issue: STAT if pt c/o blurred vision, one-sided weakness or slurred speech  1. What are your last 5 BP readings?    Last Night : 91/61    This morning: 115/71   2. Are you having any other symptoms (ex. Dizziness, headache, blurred vision, passed out)? No   3. What is your BP issue? Started Entresto Tuesday and was tolda to call if low bp.   Patient took last dose last night

## 2014-12-29 NOTE — Telephone Encounter (Signed)
Systolic of [id number] is ok to continue low dose Entresto. She would need to hold dose for systolic 123456. Have her take BP before each dose that way she knows if it is ok to take at that time. At her follow up we can discuss if she would like to continue this medication vs starting low dose beta blocker.

## 2014-12-29 NOTE — Telephone Encounter (Signed)
S/w pt of Ryan's recommendations. Pt verbalized understanding to hold Entresto if systolic 123456. She will continue to monitor BP prior to med and call if she has to hold med frequently. Pt agreeable w/plan w/no further questions

## 2014-12-30 ENCOUNTER — Telehealth: Payer: Self-pay

## 2014-12-30 NOTE — Telephone Encounter (Signed)
Additional info including office notes faxed to [facility] for [employer]

## 2015-01-02 ENCOUNTER — Telehealth: Payer: Self-pay

## 2015-01-02 NOTE — Telephone Encounter (Signed)
Entresto approved per [employer]. Good through 03/04/2015.

## 2015-01-02 NOTE — Telephone Encounter (Signed)
Entresto 24-26 approved through [facility].

## 2015-01-23 ENCOUNTER — Other Ambulatory Visit: Payer: Self-pay

## 2015-01-23 ENCOUNTER — Ambulatory Visit (INDEPENDENT_AMBULATORY_CARE_PROVIDER_SITE_OTHER): Payer: PPO

## 2015-01-23 DIAGNOSIS — I5022 Chronic systolic (congestive) heart failure: Secondary | ICD-10-CM | POA: Diagnosis not present

## 2015-01-24 ENCOUNTER — Other Ambulatory Visit: Payer: Self-pay

## 2015-01-24 DIAGNOSIS — R9430 Abnormal result of cardiovascular function study, unspecified: Secondary | ICD-10-CM

## 2015-01-30 ENCOUNTER — Ambulatory Visit (INDEPENDENT_AMBULATORY_CARE_PROVIDER_SITE_OTHER): Payer: PPO | Admitting: Nurse Practitioner

## 2015-01-30 VITALS — BP 116/68 | HR 94 | Temp 97.9°F | Resp 12 | Ht 60.0 in | Wt 102.6 lb

## 2015-01-30 DIAGNOSIS — I10 Essential (primary) hypertension: Secondary | ICD-10-CM

## 2015-01-30 DIAGNOSIS — Z299 Encounter for prophylactic measures, unspecified: Secondary | ICD-10-CM

## 2015-01-30 DIAGNOSIS — Z418 Encounter for other procedures for purposes other than remedying health state: Secondary | ICD-10-CM | POA: Diagnosis not present

## 2015-01-30 DIAGNOSIS — R5382 Chronic fatigue, unspecified: Secondary | ICD-10-CM | POA: Diagnosis not present

## 2015-01-30 DIAGNOSIS — E875 Hyperkalemia: Secondary | ICD-10-CM | POA: Diagnosis not present

## 2015-01-30 DIAGNOSIS — Z23 Encounter for immunization: Secondary | ICD-10-CM | POA: Diagnosis not present

## 2015-01-30 DIAGNOSIS — Z8619 Personal history of other infectious and parasitic diseases: Secondary | ICD-10-CM | POA: Diagnosis not present

## 2015-01-30 LAB — CBC WITH DIFFERENTIAL/PLATELET
BASOS PCT: 0.2 % (ref 0.0–3.0)
Basophils Absolute: 0 10*3/uL (ref 0.0–0.1)
EOS ABS: 0.1 10*3/uL (ref 0.0–0.7)
EOS PCT: 0.9 % (ref 0.0–5.0)
HEMATOCRIT: 42.2 % (ref 36.0–46.0)
HEMOGLOBIN: 14 g/dL (ref 12.0–15.0)
LYMPHS PCT: 18.7 % (ref 12.0–46.0)
Lymphs Abs: 1.4 10*3/uL (ref 0.7–4.0)
MCHC: 33.2 g/dL (ref 30.0–36.0)
MCV: 92.1 fl (ref 78.0–100.0)
MONO ABS: 0.3 10*3/uL (ref 0.1–1.0)
Monocytes Relative: 4 % (ref 3.0–12.0)
Neutro Abs: 5.7 10*3/uL (ref 1.4–7.7)
Neutrophils Relative %: 76.2 % (ref 43.0–77.0)
Platelets: 179 10*3/uL (ref 150.0–400.0)
RBC: 4.58 Mil/uL (ref 3.87–5.11)
RDW: 16 % — AB (ref 11.5–15.5)
WBC: 7.4 10*3/uL (ref 4.0–10.5)

## 2015-01-30 LAB — COMPREHENSIVE METABOLIC PANEL
ALBUMIN: 4.4 g/dL (ref 3.5–5.2)
ALK PHOS: 55 U/L (ref 39–117)
ALT: 37 U/L — ABNORMAL HIGH (ref 0–35)
AST: 30 U/L (ref 0–37)
BUN: 26 mg/dL — ABNORMAL HIGH (ref 6–23)
CALCIUM: 10 mg/dL (ref 8.4–10.5)
CHLORIDE: 102 meq/L (ref 96–112)
CO2: 30 mEq/L (ref 19–32)
CREATININE: 1.06 mg/dL (ref 0.40–1.20)
GFR: 53.96 mL/min — ABNORMAL LOW (ref >60.00)
Glucose, Bld: 87 mg/dL (ref 70–99)
POTASSIUM: 5.5 meq/L — AB (ref 3.5–5.1)
Sodium: 140 mEq/L (ref 135–145)
Total Bilirubin: 0.6 mg/dL (ref 0.2–1.2)
Total Protein: 6.9 g/dL (ref 6.0–8.3)

## 2015-01-30 NOTE — Patient Instructions (Signed)
Please visit the lab before leaving today.   You can make a Nurse visit for the Prevnar 13 if you would like.   Follow up in 6 months or less if needed.

## 2015-01-30 NOTE — Progress Notes (Signed)
Pre visit review using our clinic review tool, if applicable. No additional management support is needed unless otherwise documented below in the visit note. 

## 2015-01-30 NOTE — Progress Notes (Signed)
Patient ID: [name], female    DOB: Feb 16, 1941  Age: 74 y.o. MRN: [id number]  CC: Follow-up   HPI [name] presents for follow up of cardiac concerns, pain, fatigue, and sleep disturbance.   1) Cardiac- Pt was seen in Oct. By Christell Faith PA-C for CAD and CHF.  Pt was counseled about Cardiac Rehab and has continually declined.  Back pain still and issue and Norco was leading to dizziness. Pt is to follow up with Dr. Rockey Situ in 3 months.   Entresto to be held if systolic lower than [id number]   She reports when taking BP it is above 110 mostly, but the Entresto is dropping her BP to the 80's so she is holding it more often than previously.   Echo- Pt will see EP in Feb to discuss AICD insertion  Questions- Lasix- does it make feet peel? Taking 2 ibuprofen (400 mg approx) for back pain and it is helpful Sleeping well   History [name] has a past medical history of UTI (lower urinary tract infection); Arthritis; Osteoporosis; Hepatitis C; Chronic systolic CHF (congestive heart failure) ([place]); CAD (coronary artery disease); Ischemic cardiomyopathy; Chronic back pain; and Chronic fatigue.   She has past surgical history that includes Back surgery (07/14); Cardiac catheterization (Bilateral, 10/27/2014); and Cardiac catheterization (N/A, 10/27/2014).   Her family history includes Arthritis in her mother; Diabetes in her father; Heart disease in her father.She reports that she has quit smoking. She has never used smokeless tobacco. She reports that she does not drink alcohol or use illicit drugs.  Outpatient Prescriptions Prior to Visit  Medication Sig Dispense Refill  . aspirin 81 MG chewable tablet Chew 1 tablet (81 mg total) by mouth daily. 30 tablet 6  . atorvastatin (LIPITOR) 80 MG tablet Take 1 tablet (80 mg total) by mouth daily at 6 PM. 30 tablet 6  . Calcium-Vitamin A-Vitamin D (LIQUID CALCIUM PO) Take by mouth. 1 tablespoon daily    . Chlorpheniramine Maleate (ALLERGY PO) Take 1  tablet by mouth daily.    . cholecalciferol (VITAMIN D) 1000 UNITS tablet Take 1,000 Units by mouth daily.    . clonazePAM (KLONOPIN) 0.5 MG tablet Take 1 tablet (0.5 mg total) by mouth at bedtime. 30 tablet 0  . clopidogrel (PLAVIX) 75 MG tablet Take 1 tablet (75 mg total) by mouth daily. 30 tablet 11  . denosumab (PROLIA) 60 MG/ML SOLN injection Inject 60 mg into the skin every 6 (six) months. 1 Syringe 0  . docusate sodium (COLACE) 100 MG capsule Take 1 capsule (100 mg total) by mouth 2 (two) times daily. (Patient taking differently: Take 100 mg by mouth as needed. ) 10 capsule 0  . furosemide (LASIX) 20 MG tablet Take 1 tablet (20 mg total) by mouth every other day. 30 tablet 6  . Magnesium 250 MG TABS Take 1 tablet by mouth daily.    . Multiple Vitamin (MULTIVITAMIN) tablet Take 1 tablet by mouth daily.    . ondansetron (ZOFRAN ODT) 4 MG disintegrating tablet Take 1 tablet (4 mg total) by mouth every 8 (eight) hours as needed for nausea or vomiting. 20 tablet 2  . potassium chloride (K-DUR) 10 MEQ tablet Take 1 tablet (10 mEq total) by mouth every other day. (Patient taking differently: Take 10 mEq by mouth daily. ) 30 tablet 6  . sacubitril-valsartan (ENTRESTO) 24-26 MG Take 1 tablet by mouth 2 (two) times daily. 60 tablet 11  . senna (SENOKOT) 8.6 MG TABS tablet Take  1 tablet (8.6 mg total) by mouth 2 (two) times daily. (Patient taking differently: Take 1 tablet by mouth at bedtime as needed. ) 120 each 0  . HYDROcodone-acetaminophen (NORCO/VICODIN) 5-325 MG tablet Take 1 tablet by mouth 2 (two) times daily. (Patient taking differently: Take 1 tablet by mouth as needed. ) 60 tablet 0  . lisinopril (PRINIVIL,ZESTRIL) 2.5 MG tablet Take 2 tablets (5 mg total) by mouth at bedtime. (Patient not taking: Reported on 01/30/2015) 30 tablet 6   No facility-administered medications prior to visit.    ROS Review of Systems  Constitutional: Positive for fatigue. Negative for fever, chills and  diaphoresis.  Respiratory: Negative for chest tightness, shortness of breath and wheezing.   Cardiovascular: Negative for chest pain, palpitations and leg swelling.  Gastrointestinal: Negative for nausea, vomiting and diarrhea.  Musculoskeletal: Positive for arthralgias.  Skin: Negative for rash.  Neurological: Negative for dizziness, weakness, numbness and headaches.  Psychiatric/Behavioral: Positive for sleep disturbance. The patient is nervous/anxious.     Objective:  BP 116/68 mmHg  Pulse 94  Temp(Src) 97.9 F (36.6 C)  Resp 12  Ht 5' (1.524 m)  Wt 102 lb 9.6 oz (46.539 kg)  BMI 20.04 kg/m2  SpO2 98%  Physical Exam  Constitutional: She is oriented to person, place, and time. She appears well-developed and well-nourished. No distress.  HENT:  Head: Normocephalic and atraumatic.  Right Ear: External ear normal.  Left Ear: External ear normal.  Cardiovascular: Normal rate and regular rhythm.  Exam reveals no gallop and no friction rub.   Murmur heard. Pulmonary/Chest: Effort normal and breath sounds normal. No respiratory distress. She has no wheezes. She has no rales. She exhibits no tenderness.  Neurological: She is alert and oriented to person, place, and time. No cranial nerve deficit. She exhibits normal muscle tone. Coordination normal.  Skin: Skin is warm and dry. No rash noted. She is not diaphoretic.  Psychiatric: She has a normal mood and affect. Her behavior is normal. Judgment and thought content normal.   Assessment & Plan:   [name] was seen today for follow-up.  Diagnoses and all orders for this visit:  Chronic fatigue -     Comprehensive metabolic panel -     CBC with Differential/Platelet  Hyperkalemia -     Comprehensive metabolic panel  Essential hypertension -     Comprehensive metabolic panel  History of hepatitis C   I have discontinued [name]'s lisinopril and HYDROcodone-acetaminophen. I am also having her maintain her multivitamin,  Magnesium, cholecalciferol, Calcium-Vitamin A-Vitamin D (LIQUID CALCIUM PO), denosumab, clonazePAM, Chlorpheniramine Maleate (ALLERGY PO), aspirin, atorvastatin, docusate sodium, senna, furosemide, potassium chloride, clopidogrel, ondansetron, and sacubitril-valsartan.  No orders of the defined types were placed in this encounter.     Follow-up: Return in about 6 months (around 07/30/2015) for Follow up.

## 2015-01-31 ENCOUNTER — Other Ambulatory Visit: Payer: Self-pay | Admitting: Nurse Practitioner

## 2015-01-31 DIAGNOSIS — R7401 Elevation of levels of liver transaminase levels: Secondary | ICD-10-CM

## 2015-01-31 DIAGNOSIS — E875 Hyperkalemia: Secondary | ICD-10-CM

## 2015-01-31 DIAGNOSIS — R740 Nonspecific elevation of levels of transaminase and lactic acid dehydrogenase [LDH]: Secondary | ICD-10-CM

## 2015-02-03 ENCOUNTER — Telehealth: Payer: Self-pay | Admitting: *Deleted

## 2015-02-03 NOTE — Telephone Encounter (Signed)
Pt c/o medication issue:  1. Name of Medication: Lasik   2. How are you currently taking this medication (dosage and times per day)? Everyone other day 20 mg  3. Are you having a reaction (difficulty breathing--STAT)? No   4. What is your medication issue? Ever since she stated in aug, her nose has been running. She said its irritated from blowing her nose out so much would like to see  If there is something else she can take.  Please advise

## 2015-02-04 NOTE — Telephone Encounter (Signed)
This is not a known adverse effect of Lasix. If anything it would cause dry mucus membranes. Suggest she follow up with PCP and continue Lasix.

## 2015-02-05 ENCOUNTER — Encounter: Payer: Self-pay | Admitting: Nurse Practitioner

## 2015-02-05 NOTE — Assessment & Plan Note (Signed)
BP Readings from Last 3 Encounters:  01/30/15 116/68  12/23/14 114/80  11/15/14 122/65   Asked her to contact Christell Faith, PA about Delene Loll dropping her BP low. Continue to check and not take if systolic is [id number] (instructions per note from [place]).

## 2015-02-05 NOTE — Assessment & Plan Note (Signed)
Checking a CMET today. Will follow

## 2015-02-05 NOTE — Assessment & Plan Note (Addendum)
Pt reports taking Potassium incorrectly. Will check cmet today. Take Potassium 10 MEQ only with Lasix (taking every other day)

## 2015-02-05 NOTE — Assessment & Plan Note (Signed)
Chronic. Not worsening. Will check cbc w/ diff and CMET today. Discussed healthy diet, staying as active as possible. Sleep has improved.

## 2015-02-06 DIAGNOSIS — Z23 Encounter for immunization: Secondary | ICD-10-CM

## 2015-02-06 DIAGNOSIS — Z418 Encounter for other procedures for purposes other than remedying health state: Secondary | ICD-10-CM | POA: Diagnosis not present

## 2015-02-06 NOTE — Telephone Encounter (Signed)
Spoke w/ pt.  Advised her of Ryan's recommendation. She states that her PCP advised her that the runny nose is a rare side effect of lasix, as well as peeling on the bottom of her feet. Advised her that I do not see either of these as potential side effects for lasix, but she states that her pharmacist also told her to see if there is another diuretic she can take.  Pt has been holding lasix for the past 2-3 days and reports that sx have improved, but she is unsure if she has allergies and improvement is 2/2 rain over the weekend. She states that she has not noticed increase in urination, feels that her fluid loss is thru her nose.  She would like for me to make Ryan aware and see if he will consider changing her med, as her runny nose has been constant since August.

## 2015-02-06 NOTE — Telephone Encounter (Signed)
Spoke w/ pt.  Advised her of Ryan's recommendation.  She states that she would like to stay on the lasix and call back if her sx do not improve. Asked her to call back if we can be of further assistance.

## 2015-02-06 NOTE — Telephone Encounter (Signed)
Can switch to torsemide 10 mg every other day (just like she was taking Lasix). It looks like her potassium was trending up in late November in the setting of incorrect KCl usage. Would make sure she follows up with PCP for this since it looks like they are following it. Recommend follow up bmet on our end in 1 week given change to torsemide.

## 2015-02-06 NOTE — Addendum Note (Signed)
Addended by: Cheyenne Adas A on: 02/06/2015 12:00 PM   Modules accepted: Orders

## 2015-03-07 ENCOUNTER — Other Ambulatory Visit (INDEPENDENT_AMBULATORY_CARE_PROVIDER_SITE_OTHER): Payer: PPO

## 2015-03-07 DIAGNOSIS — E875 Hyperkalemia: Secondary | ICD-10-CM | POA: Diagnosis not present

## 2015-03-07 DIAGNOSIS — R7401 Elevation of levels of liver transaminase levels: Secondary | ICD-10-CM

## 2015-03-07 DIAGNOSIS — R740 Nonspecific elevation of levels of transaminase and lactic acid dehydrogenase [LDH]: Secondary | ICD-10-CM | POA: Diagnosis not present

## 2015-03-07 LAB — COMPREHENSIVE METABOLIC PANEL
ALK PHOS: 54 U/L (ref 39–117)
ALT: 33 U/L (ref 0–35)
AST: 28 U/L (ref 0–37)
Albumin: 4.3 g/dL (ref 3.5–5.2)
BUN: 29 mg/dL — ABNORMAL HIGH (ref 6–23)
CO2: 31 mEq/L (ref 19–32)
Calcium: 10.1 mg/dL (ref 8.4–10.5)
Chloride: 103 mEq/L (ref 96–112)
Creatinine, Ser: 0.9 mg/dL (ref 0.40–1.20)
GFR: 65.15 mL/min (ref >60.00)
GLUCOSE: 112 mg/dL — AB (ref 70–99)
POTASSIUM: 5 meq/L (ref 3.5–5.1)
Sodium: 140 mEq/L (ref 135–145)
TOTAL PROTEIN: 7 g/dL (ref 6.0–8.3)
Total Bilirubin: 0.4 mg/dL (ref 0.2–1.2)

## 2015-03-09 ENCOUNTER — Other Ambulatory Visit: Payer: Self-pay | Admitting: Nurse Practitioner

## 2015-03-09 NOTE — Telephone Encounter (Signed)
Please advise refill.  Thanks 

## 2015-03-10 ENCOUNTER — Encounter: Payer: Self-pay | Admitting: Nurse Practitioner

## 2015-03-14 ENCOUNTER — Telehealth: Payer: Self-pay | Admitting: Nurse Practitioner

## 2015-03-14 NOTE — Telephone Encounter (Signed)
I have electronically submitted pt's info for [employer] insurance verification and will notify you once I have a response. Thank you. °

## 2015-03-20 NOTE — Telephone Encounter (Signed)
I have rec'd pt's insurance verification and whether an OV is billed or not, [name] will have an estimated responsibility of $10 co-pay PLUS $195 co-insurance for a total of $205. Please make pt aware this is an estimate and we will not know and exact amt until insurance(s) has/have paid.   I have sent a copy of the summary of benefits to be scanned into pt's chart.  Once pt recs injection, please let me know actual injection date so I can update the [employer] portal.    If [name] cannot afford $205 for her injection, please advise her to contact [employer] at [phone] and select option #1 to see if she qualifies for one of their assistance programs.  If she qualifies they will instruct her how to proceed.  If you have any questions, please let me know. Thank you.

## 2015-03-20 NOTE — Telephone Encounter (Signed)
I spoke with the husband.   Rose can you look into this further for me.  The patient states that she should not have to pay more then $85 for this medication.  The husband wants to call the insurance for an explantation of why it is 10 plus the 195 co-insurance.  Can you explain what that means.  I told them it might be due to it being a shot at the office I am not sure. Please advise.  thanks

## 2015-03-20 NOTE — Telephone Encounter (Signed)
Spoke with the patient.  She went ahead and schedule the next injection for February 10th.  Will order the Prolia in [facility].  She will call [employer] to see if she qualifies for a discount.

## 2015-03-20 NOTE — Telephone Encounter (Signed)
Attempted to call the husband back at the number he provided, unable to leave a message.  He is questioning the [place] verification.

## 2015-03-21 NOTE — Telephone Encounter (Signed)
Yes , can you speak with her husband Rush Landmark.  He is the one questioning it.  Thanks

## 2015-03-21 NOTE — Telephone Encounter (Addendum)
This is what the insurance verification stated which is from her insurance:   Prolia is subject to a 20% co-insurance up to a $3400 out of pocket max ($0 met). Whether office visit (OV) is billed or not, the patient will be responsible for a $10 co-pay which will cover admin and the OV. Once out of pocket max is met, coverage increases to 100% of the contracted rate. Co-pays contribute to the OOP max. Once met, copays will be waived. No deductible applies.  This is pretty much the cost back in July of 2016, except the admin fee was $15 instead of the current $10.  If you need me to speak w/them I will/can.

## 2015-03-24 ENCOUNTER — Encounter: Payer: Self-pay | Admitting: Cardiovascular Disease

## 2015-03-24 ENCOUNTER — Ambulatory Visit (INDEPENDENT_AMBULATORY_CARE_PROVIDER_SITE_OTHER): Payer: PPO | Admitting: Cardiovascular Disease

## 2015-03-24 VITALS — BP 118/66 | HR 95 | Ht 60.0 in | Wt 103.8 lb

## 2015-03-24 DIAGNOSIS — I25119 Atherosclerotic heart disease of native coronary artery with unspecified angina pectoris: Secondary | ICD-10-CM

## 2015-03-24 DIAGNOSIS — I255 Ischemic cardiomyopathy: Secondary | ICD-10-CM

## 2015-03-24 DIAGNOSIS — Z955 Presence of coronary angioplasty implant and graft: Secondary | ICD-10-CM

## 2015-03-24 DIAGNOSIS — E782 Mixed hyperlipidemia: Secondary | ICD-10-CM | POA: Insufficient documentation

## 2015-03-24 DIAGNOSIS — E785 Hyperlipidemia, unspecified: Secondary | ICD-10-CM

## 2015-03-24 DIAGNOSIS — I5022 Chronic systolic (congestive) heart failure: Secondary | ICD-10-CM | POA: Diagnosis not present

## 2015-03-24 MED ORDER — CARVEDILOL 3.125 MG PO TABS: 3.1250 mg | ORAL_TABLET | Freq: Two times a day (BID) | ORAL | Status: DC

## 2015-03-24 MED ORDER — TORSEMIDE 20 MG PO TABS: 10.0000 mg | ORAL_TABLET | ORAL | Status: DC

## 2015-03-24 NOTE — Assessment & Plan Note (Signed)
Previously had problems on brilinta, Changed to Plavix

## 2015-03-24 NOTE — Assessment & Plan Note (Signed)
Long discussion concerning her medications for cardiomyopathy. She is concerned that ejection fraction has not improved, still 25% despite recent stent placement 2. Stressed importance of some of her medications. Encouraged her to take low-dose entresto twice a day, also carvedilol 3.125 mg twice a day. She does not want Lasix as she feels this contributes to her runny nose, we will change to torsemide 10 mg with potassium every other day --She has appointment with Dr. Caryl Comes in several weeks' time, we have encouraged her to keep this

## 2015-03-24 NOTE — Patient Instructions (Addendum)
You are doing well.  Please try flonase (steroid nasal spray) for runny nose  Please try the entresto 24/26 mg twice a day With coreg 3.125 mg twice a day  With torsemide 1/2 pill with potassium every other day Stop the lasix   Please call us if you have new issues that need to be addressed before your next appt.  Your physician wants you to follow-up in: 2 month.   Date & time: ________________________________

## 2015-03-24 NOTE — Assessment & Plan Note (Signed)
Appears oddly fluid overloaded today, trace pitting edema of the left lower extremity She is not tolerating Lasix well, feels it is causing her runny nose We'll start on low-dose torsemide and can titrate upwards as blood pressure tolerates If she has problems on torsemide, could try metolazone

## 2015-03-24 NOTE — Assessment & Plan Note (Signed)
Currently with no symptoms of angina. No further workup at this time. Continue current medication regimen. 

## 2015-03-24 NOTE — Assessment & Plan Note (Signed)
Encouraged her to stay on her Lipitor daily, goal LDL less than 70

## 2015-03-24 NOTE — Progress Notes (Signed)
Patient ID: [name], female    DOB: [birth year], 75 y.o.   MRN: [id number]  HPI Comments: 75 year old female with history of hepatitis C, osteoporosis, arthritis, chronic back pain s/p multiple fusions, and chronic UTI's, ischemic cardiomyopathy with ejection fraction 25% on echocardiogram November 2016, coronary artery disease with PCI to the mid RCA, proximal LAD August 2016, who presents for routine follow-up of her cardiomyopathy  In follow-up today, she reports that she is taking entresto very infrequently. Currently not on beta blockers She takes Lasix 20 mg every other day with low-dose potassium every other day. Reports having leg edema worse on the left than the right.  She has been hesitant to take her medications as she feels her blood pressure is low Denies any symptoms concerning for orthostasis Denies any chest pain concerning for ischemia She feels the Lasix is contributing to her runny nose and would like to change her diuretic  EKG on today's visit shows normal sinus rhythm with left bundle branch block, rate 95 bpm Heart rate typically runs 80s to 90s at home Weight at home typically 101 up to 102 pounds  Other past medical history reviewed [facility] 10/25/14 to 8/26 for new onset acute systolic CHF, ischemic cardiomyopathy, acute respiratory distress with hypoxia, and newly diagnosed CAD s/p PCI/DES to mid RCA and prox LAD.     Admitted to [facility] in August with acute respiraotry destress with hypoxia   Echo August 2016 showed EF of 20%, akinesis of the inferior, inferoseptal, distal anterior, and apical walls. There was hypokinesis elsewhere. Cavity size was mildly dilated. Wall thickness was normal. Mild aortic regurgitation. Mild to moderate mitral regurgitation. Left atrium was mildly dilated. Right ventricle systolic was was moderate to severely reduced. A small pericardial effusion was seen.    cardiac cath on 8/25 that showed ostial LAD 30%, prox LAD 90%, ostial RCA  30%, mid RCA 90%, ostial Ramus 50%.  She underwent successful PCI/DES to the proximal LAD and mid RCA with Xience DES. She was started on aspirin and Brilinta.   she could not tolerated Brilinta 2/2 SOB. She was changed to Plavix.        Allergies  Allergen Reactions  . Sugar-Protein-Starch Nausea And Vomiting    Only to sugar    Current Outpatient Prescriptions on File Prior to Visit  Medication Sig Dispense Refill  . atorvastatin (LIPITOR) 80 MG tablet Take 1 tablet (80 mg total) by mouth daily at 6 PM. 30 tablet 6  . Calcium-Vitamin A-Vitamin D (LIQUID CALCIUM PO) Take by mouth. 1 tablespoon daily    . cholecalciferol (VITAMIN D) 1000 UNITS tablet Take 1,000 Units by mouth daily.    . clopidogrel (PLAVIX) 75 MG tablet Take 1 tablet (75 mg total) by mouth daily. 30 tablet 11  . denosumab (PROLIA) 60 MG/ML SOLN injection Inject 60 mg into the skin every 6 (six) months. 1 Syringe 0  . Magnesium 250 MG TABS Take 1 tablet by mouth daily.    . Multiple Vitamin (MULTIVITAMIN) tablet Take 1 tablet by mouth daily.    . ondansetron (ZOFRAN-ODT) 4 MG disintegrating tablet TAKE 1 TABLET BY MOUTH TWICE A DAY AS NEEDED FOR NAUSEA AND VOMITING 60 tablet 0  . potassium chloride (K-DUR) 10 MEQ tablet Take 1 tablet (10 mEq total) by mouth every other day. (Patient taking differently: Take 10 mEq by mouth daily. ) 30 tablet 6  . sacubitril-valsartan (ENTRESTO) 24-26 MG Take 1 tablet by mouth 2 (two) times  daily. 60 tablet 11  . Chlorpheniramine Maleate (ALLERGY PO) Take 1 tablet by mouth daily. Reported on 03/24/2015    . clonazePAM (KLONOPIN) 0.5 MG tablet Take 1 tablet (0.5 mg total) by mouth at bedtime. (Patient not taking: Reported on 03/24/2015) 30 tablet 0  . docusate sodium (COLACE) 100 MG capsule Take 1 capsule (100 mg total) by mouth 2 (two) times daily. (Patient not taking: Reported on 03/24/2015) 10 capsule 0  . senna (SENOKOT) 8.6 MG TABS tablet Take 1 tablet (8.6 mg total) by mouth 2 (two)  times daily. (Patient not taking: Reported on 03/24/2015) 120 each 0   No current facility-administered medications on file prior to visit.    Past Medical History  Diagnosis Date  . UTI (lower urinary tract infection)   . Arthritis   . Osteoporosis   . Hepatitis C   . Chronic systolic CHF (congestive heart failure) ([place])     a. echo 10/2014: EF 20%, AK inf, inf-sep, distal ant, & apical walls, HK elsewhere. Cavity size mildly dilated. Wall thickness nl. mild AI. Mild to mod MR. LA mildly dilated. RV sys fxn mod to severely reduced. small pericardial effusion was ID'd.   [place] Kitchen CAD (coronary artery disease)     a. cardiac cath 10/2014: ostial LAD 30%, prox LAD 90% s/p PCI/DES, ostial RCA 30%, mid RCA 90% s/p PCI/DES, ostial Ramus 50%  . Ischemic cardiomyopathy   . Chronic back pain   . Chronic fatigue     Past Surgical History  Procedure Laterality Date  . Back surgery  07/14    T10, L1  . Cardiac catheterization Bilateral 10/27/2014    Procedure: Left Heart Cath and Coronary Angiography;  Surgeon: Minna Merritts, MD;  Location: [place] [facility];  Service: Cardiovascular;  Laterality: Bilateral;  . Cardiac catheterization N/A 10/27/2014    Procedure: Coronary Stent Intervention;  Surgeon: Wellington Hampshire, MD;  Location: [place] [facility];  Service: Cardiovascular;  Laterality: N/A;    Social History  reports that she has quit smoking. She has never used smokeless tobacco. She reports that she does not drink alcohol or use illicit drugs.  Family History family history includes Arthritis in her mother; Diabetes in her father; Heart disease in her father.    Review of Systems  Constitutional: Positive for fatigue.  Respiratory: Negative.   Cardiovascular: Negative.   Gastrointestinal: Negative.   Musculoskeletal: Positive for back pain.  Neurological: Positive for weakness.  Hematological: Negative.   Psychiatric/Behavioral: Negative.   All other systems reviewed and are  negative.   BP 118/66 mmHg  Pulse 95  Ht 5' (1.524 m)  Wt 103 lb 12 oz (47.061 kg)  BMI 20.26 kg/m2  Physical Exam  Constitutional: She is oriented to person, place, and time.  Thin, kyphosis  HENT:  Head: Normocephalic.  Nose: Nose normal.  Mouth/Throat: Oropharynx is clear and moist.  Eyes: Conjunctivae are normal. Pupils are equal, round, and reactive to light.  Neck: Normal range of motion. Neck supple. No JVD present.  Cardiovascular: Normal rate, regular rhythm, normal heart sounds and intact distal pulses.  Exam reveals no gallop and no friction rub.   No murmur heard. Pulmonary/Chest: Effort normal and breath sounds normal. No respiratory distress. She has no wheezes. She has no rales. She exhibits no tenderness.  Abdominal: Soft. Bowel sounds are normal. She exhibits no distension. There is no tenderness.  Musculoskeletal: Normal range of motion. She exhibits no edema or tenderness.  Lymphadenopathy:  She has no cervical adenopathy.  Neurological: She is alert and oriented to person, place, and time. Coordination normal.  Skin: Skin is warm and dry. No rash noted. No erythema.  Psychiatric: She has a normal mood and affect. Her behavior is normal. Judgment and thought content normal.

## 2015-03-28 ENCOUNTER — Encounter: Payer: Self-pay | Admitting: Cardiovascular Disease

## 2015-04-06 ENCOUNTER — Ambulatory Visit (INDEPENDENT_AMBULATORY_CARE_PROVIDER_SITE_OTHER): Payer: PPO | Admitting: Internal Medicine

## 2015-04-06 ENCOUNTER — Encounter: Payer: Self-pay | Admitting: Internal Medicine

## 2015-04-06 ENCOUNTER — Encounter: Payer: Self-pay | Admitting: *Deleted

## 2015-04-06 VITALS — BP 118/64 | HR 87 | Ht 60.0 in | Wt 102.0 lb

## 2015-04-06 DIAGNOSIS — I255 Ischemic cardiomyopathy: Secondary | ICD-10-CM | POA: Diagnosis not present

## 2015-04-06 DIAGNOSIS — I5022 Chronic systolic (congestive) heart failure: Secondary | ICD-10-CM

## 2015-04-06 NOTE — Progress Notes (Signed)
ELECTROPHYSIOLOGY CONSULT NOTE  Patient ID: [name], MRN: [id number], DOB/AGE: [birth year] 75 y.o. Admit date: (Not on file) Date of Consult: 04/06/2015  Primary Physician: Rubbie Battiest, NP Primary Cardiologist: TG Consulting Physician Gollan  Chief Complaint:  Consideration of ICD implantation   HPI [name] is a 75 y.o. female   With a history of ischemic heart disease.   She presented 8/16 severe two-vessel disease and underwent drug-eluting stenting of both her proximal LAD as well as mid RCA. Ejection fraction at that time was 20%, akinesis of the inferior, inferoseptal, distal anterior, and apical walls. There was hypokinesis elsewhere. Cavity size was mildly dilated. Wall thickness was normal. Mild aortic regurgitation. Mild to moderate mitral regurgitation. Left atrium was mildly dilated. Right ventricle systolic was was moderate to severely reduced. A small pericardial effusion was seen.    repeat echocardiogram 11/16 demonstrated no interval change  With an ejection fraction of 20-25%. There had been interval normalization of RV systolic function. She had moderate mitral regurgitation. There was mild LAE.   she has had persistent tachycardia with heart rates in the 80-95 range with left bundle branch block.   Review of epic demonstrates no heart rates lower than 87  Except during acute episode in the middle of August.  She has modest shortness of breath but her major limitation in movement is her back. This has been a chronic and severe problem for the last 4-5 years which is somewhat better now than it has been with a decreased narcotic burden  She notes however that she has "quality-of-life". She acknowledges passive death wish. I did not query regarding active death wish    She has left bundle branch block. Based on recent paper she has multiple deflections in her QRS complexes which is associated with a longer Q-LV interval     Past Medical History    Diagnosis Date  . UTI (lower urinary tract infection)   . Arthritis   . Osteoporosis   . Hepatitis C   . Chronic systolic CHF (congestive heart failure) ([place])     a. echo 10/2014: EF 20%, AK inf, inf-sep, distal ant, & apical walls, HK elsewhere. Cavity size mildly dilated. Wall thickness nl. mild AI. Mild to mod MR. LA mildly dilated. RV sys fxn mod to severely reduced. small pericardial effusion was ID'd.   Marland Kitchen CAD (coronary artery disease)     a. cardiac cath 10/2014: ostial LAD 30%, prox LAD 90% s/p PCI/DES, ostial RCA 30%, mid RCA 90% s/p PCI/DES, ostial Ramus 50%  . Ischemic cardiomyopathy   . Chronic back pain   . Chronic fatigue       Surgical History:  Past Surgical History  Procedure Laterality Date  . Back surgery  07/14    T10, L1  . Cardiac catheterization Bilateral 10/27/2014    Procedure: Left Heart Cath and Coronary Angiography;  Surgeon: Minna Merritts, MD;  Location: [facility];  Service: Cardiovascular;  Laterality: Bilateral;  . Cardiac catheterization N/A 10/27/2014    Procedure: Coronary Stent Intervention;  Surgeon: Wellington Hampshire, MD;  Location: [facility];  Service: Cardiovascular;  Laterality: N/A;     Home Meds: Prior to Admission medications   Medication Sig Start Date End Date Taking? Authorizing Provider  aspirin EC 81 MG tablet Take 81 mg by mouth daily.   Yes Historical Provider, MD  atorvastatin (LIPITOR) 80 MG tablet Take 1 tablet (80 mg total) by  mouth daily at 6 PM. 10/28/14  Yes Theodoro Grist, MD  Calcium-Vitamin A-Vitamin D (LIQUID CALCIUM PO) Take by mouth. 1 tablespoon daily   Yes Historical Provider, MD  carvedilol (COREG) 3.125 MG tablet Take 1 tablet (3.125 mg total) by mouth 2 (two) times daily. 03/24/15  Yes Minna Merritts, MD  Chlorpheniramine Maleate (ALLERGY PO) Take 1 tablet by mouth daily. Reported on 03/24/2015   Yes Historical Provider, MD  cholecalciferol (VITAMIN D) 1000 UNITS tablet Take 1,000 Units by mouth  daily.   Yes Historical Provider, MD  clopidogrel (PLAVIX) 75 MG tablet Take 1 tablet (75 mg total) by mouth daily. 10/30/14  Yes Brett Canales, PA-C  denosumab (PROLIA) 60 MG/ML SOLN injection Inject 60 mg into the skin every 6 (six) months. 04/04/14  Yes Rubbie Battiest, NP  docusate sodium (COLACE) 100 MG capsule Take 1 capsule (100 mg total) by mouth 2 (two) times daily. 10/28/14  Yes Theodoro Grist, MD  Magnesium 250 MG TABS Take 1 tablet by mouth daily.   Yes Historical Provider, MD  Multiple Vitamin (MULTIVITAMIN) tablet Take 1 tablet by mouth daily.   Yes Historical Provider, MD  ondansetron (ZOFRAN-ODT) 4 MG disintegrating tablet TAKE 1 TABLET BY MOUTH TWICE A DAY AS NEEDED FOR NAUSEA AND VOMITING 03/09/15  Yes Rubbie Battiest, NP  potassium chloride (K-DUR) 10 MEQ tablet Take 1 tablet (10 mEq total) by mouth every other day. Patient taking differently: Take 10 mEq by mouth daily.  10/28/14  Yes Theodoro Grist, MD  sacubitril-valsartan (ENTRESTO) 24-26 MG Take 1 tablet by mouth 2 (two) times daily. 12/23/14  Yes Ryan M Dunn, PA-C  torsemide (DEMADEX) 20 MG tablet Take 0.5 tablets (10 mg total) by mouth every other day. 03/24/15  Yes Minna Merritts, MD  clonazePAM (KLONOPIN) 0.5 MG tablet Take 1 tablet (0.5 mg total) by mouth at bedtime. Patient not taking: Reported on 04/06/2015 10/20/14   Rubbie Battiest, NP  senna (SENOKOT) 8.6 MG TABS tablet Take 1 tablet (8.6 mg total) by mouth 2 (two) times daily. Patient not taking: Reported on 03/24/2015 10/28/14   Theodoro Grist, MD    Allergies:  Allergies  Allergen Reactions  . Sugar-Protein-Starch Nausea And Vomiting    Only to sugar    Social History   Social History  . Marital Status: Married    Spouse Name: Shaconda Debruin  . Number of Children: 0  . Years of Education: 14   Occupational History  . Receptionist     Retired in [place]  . Smoking status: Former Smoker -- 1.00 packs/day for 10 years  . Smokeless tobacco:  Never Used  . Alcohol Use: No  . Drug Use: No  . Sexual Activity: Yes    Birth Control/ Protection: Post-menopausal   Other Topics Concern  . Not on file   Social History Narrative   Pt is 75 yo female, married to husband of 21 years.  Pt has no children.  Pt lives with husband and teacup poodle, [name]. Pt states she hurt her back last year and has not been able to participate in much physical activity since. Pt enjoys traveling with her husband in motor home, doing crossword puzzles with husband, and playing cards with friends.  Pt was raised in [place] and has lived in [place] entire adult life.  Pt worked in [employer] in [place] until [place] when they closed.       Family History  Problem Relation Age of Onset  . Arthritis Mother   . Heart disease Father     passed at 63  . Diabetes Father      ROS:  Please see the history of present illness.     All other systems reviewed and negative.    Physical Exam:   Blood pressure 118/64, pulse 87, height 5' (1.524 m), weight 102 lb (46.267 kg). General: cachectic and emaciated older Caucasian female unable to lie flat because of back   in no acute distress. Head: Normocephalic, atraumatic, sclera non-icteric, no xanthomas, nares are without discharge. EENT: normal  Lymph Nodes:  none Neck: Negative for carotid bruits. JVD not elevated. Back:with kyphosis  Lungs: Clear bilaterally to auscultation without wheezes, rales, or rhonchi. Breathing is unlabored. Heart: RRR with S1 S2.  2/6 systolic  murmur . No rubs, or gallops appreciated. Abdomen: Soft, non-tender, non-distended with normoactive bowel sounds. No hepatomegaly. No rebound/guarding. No obvious abdominal masses. Msk:  Strength and tone appear normal for age. Extremities: No clubbing or cyanosis. No edema.  Distal pedal pulses are 2+ and equal bilaterally. Skin: Warm and Dry Neuro: Alert and oriented X 3. CN III-XII intact Grossly normal sensory and motor function  . Psych:  Responds to questions appropriately with a somewhat tearful affect      Labs: Cardiac Enzymes No results for input(s): CKTOTAL, CKMB, TROPONINI in the last 72 hours. CBC Lab Results  Component Value Date   WBC 7.4 01/30/2015   HGB 14.0 01/30/2015   HCT 42.2 01/30/2015   MCV 92.1 01/30/2015   PLT 179.0 01/30/2015   PROTIME: No results for input(s): LABPROT, INR in the last 72 hours. Chemistry No results for input(s): NA, K, CL, CO2, BUN, CREATININE, CALCIUM, PROT, BILITOT, ALKPHOS, ALT, AST, GLUCOSE in the last 168 hours.  Invalid input(s): LABALBU Lipids Lab Results  Component Value Date   CHOL 136 10/26/2014   HDL 38* 10/26/2014   LDLCALC 78 10/26/2014   TRIG 100 10/26/2014   BNP PRO B NATRIURETIC PEPTIDE (BNP)  Date/Time Value Ref Range Status  11/23/2014 11:15 AM 3183.0* 0.0 - 100.0 pg/mL Final   Thyroid Function Tests: No results for input(s): TSH, T4TOTAL, T3FREE, THYROIDAB in the last 72 hours.  Invalid input(s): FREET3 Miscellaneous No results found for: DDIMER  Radiology/Studies:  No results found.  EKG:  Sinus rhythm with left bundle branch block heart rate 90 QRS duration 155 ms   Assessment and Plan:  Ischemic cardiomyopathy  Left bundle branch block  Congestive heart failure-chronic-systolic  Relative sinus tachycardia  Severe chronic pain/depression    We had a lengthy discussion regarding the role of ICD therapy for the reduction of the risk of sudden death in Patients with ischemic heart disease. We discussed the potential value of resynchronization in patients with left bundle branch block   in terms of mortality, symptoms as well as mitral regurgitation.  Her response was that she doesn't want any procedure. This occurs in the context of her acknowledging that if she were to die tonight or any night that she would be fine with that given her "no quality of life".  She is not on antidepressants but that she would be open to  thinking about that.  We also discussed treatment options with medications limited albeit a by her blood pressure. She has struggled to take even her low dose carvedilol as she thinks it causes her to be sleepy. I raised the possibility of Ivabradine. She will follow-up with Dr.  TG regarding the above.  We will be glad to see her again at her request     Virl Axe

## 2015-04-06 NOTE — Patient Instructions (Signed)
Medication Instructions: - Your physician recommends that you continue on your current medications as directed. Please refer to the Current Medication list given to you today.  Labwork: - none  Procedures/Testing: - none  Follow-Up: - Dr. Klein will see you back on an as needed basis.  Any Additional Special Instructions Will Be Listed Below (If Applicable).     If you need a refill on your cardiac medications before your next appointment, please call your pharmacy.   

## 2015-04-07 ENCOUNTER — Encounter: Payer: Self-pay | Admitting: Nurse Practitioner

## 2015-04-10 NOTE — Telephone Encounter (Signed)
Spoke w/Mr. Knoop and he is basing $85 off of what he paid in 04/2014.  Upon researching, I've realized that in Feb, 2016 Mrs. Erkkila brought the medication in from the pharmacy, which may have had an $85 co-pay.  He says they will pay the $205 since they now understand why.  I advised him to check w/pharmacy and [facility] next time to find out where he can get the best rate.

## 2015-04-11 ENCOUNTER — Telehealth: Payer: Self-pay

## 2015-04-11 NOTE — Telephone Encounter (Signed)
Pt states Carvedilol gives her a headache daily. Even when she takes 1/2 pill. Please call.

## 2015-04-11 NOTE — Telephone Encounter (Signed)
Could hold the coreg Would start metoprolol succinate 25 mg daily, take at night Start 1/2 pill for first  week then up to full pill

## 2015-04-12 MED ORDER — METOPROLOL SUCCINATE ER 25 MG PO TB24: 25.0000 mg | ORAL_TABLET | Freq: Every day | ORAL | Status: DC

## 2015-04-12 NOTE — Telephone Encounter (Addendum)
Spoke w/ pt.  Advised her of Dr. Gollan's recommendation. She verbalizes understanding and will call back w/ any questions or concerns.  

## 2015-04-14 ENCOUNTER — Ambulatory Visit: Payer: PPO

## 2015-04-14 ENCOUNTER — Ambulatory Visit (INDEPENDENT_AMBULATORY_CARE_PROVIDER_SITE_OTHER): Payer: PPO

## 2015-04-14 DIAGNOSIS — M810 Age-related osteoporosis without current pathological fracture: Secondary | ICD-10-CM | POA: Diagnosis not present

## 2015-04-14 MED ORDER — DENOSUMAB 60 MG/ML ~~LOC~~ SOLN
60.0000 mg | Freq: Once | SUBCUTANEOUS | Status: AC
  Administered 2015-04-14: 60 mg via SUBCUTANEOUS

## 2015-04-14 NOTE — Progress Notes (Signed)
Patient came in for Prolia injection.  Received in Left arm [place].  Patient tolerated well.

## 2015-04-19 NOTE — Telephone Encounter (Signed)
Patient came in on 2/10 for injection.

## 2015-04-21 ENCOUNTER — Other Ambulatory Visit: Payer: Self-pay | Admitting: Cardiovascular Disease

## 2015-05-03 ENCOUNTER — Telehealth: Payer: Self-pay | Admitting: Cardiovascular Disease

## 2015-05-03 NOTE — Telephone Encounter (Signed)
Forwarded to Dr. Rockey Situ for review.

## 2015-05-03 NOTE — Telephone Encounter (Signed)
Pt calling stating that ever since she started [facility] had a bad headache every day, wanted to see if it was the medication so she did not take it today and she seems fine she says.  Also she states she Stop taking magnesium stopped last week, Not sure if that's important  Please advise.

## 2015-05-05 NOTE — Telephone Encounter (Signed)
Spoke w/ pt.  Advised her of Dr. Donivan Scull recommendation.  She verbalizes understanding and is agreeable.  Asked her to call back w/ any further questions or concerns.

## 2015-05-05 NOTE — Telephone Encounter (Signed)
Pt is calling back, states she is getting headaches daily and would like to know if there is something different she can take. Please advise.

## 2015-05-05 NOTE — Telephone Encounter (Signed)
One option would be to try entresto only in the evening to start This is the best heart medication to make the pump stronger We can change it to an alternate medication but not quite as good

## 2015-05-25 ENCOUNTER — Encounter: Payer: Self-pay | Admitting: Cardiovascular Disease

## 2015-05-25 ENCOUNTER — Ambulatory Visit (INDEPENDENT_AMBULATORY_CARE_PROVIDER_SITE_OTHER): Payer: PPO | Admitting: Cardiovascular Disease

## 2015-05-25 VITALS — BP 110/64 | HR 72 | Ht 60.0 in | Wt 99.8 lb

## 2015-05-25 DIAGNOSIS — I2510 Atherosclerotic heart disease of native coronary artery without angina pectoris: Secondary | ICD-10-CM

## 2015-05-25 DIAGNOSIS — I5022 Chronic systolic (congestive) heart failure: Secondary | ICD-10-CM

## 2015-05-25 DIAGNOSIS — I255 Ischemic cardiomyopathy: Secondary | ICD-10-CM | POA: Diagnosis not present

## 2015-05-25 DIAGNOSIS — E785 Hyperlipidemia, unspecified: Secondary | ICD-10-CM | POA: Diagnosis not present

## 2015-05-25 NOTE — Assessment & Plan Note (Signed)
Cholesterol is at goal on the current lipid regimen. No changes to the medications were made.  

## 2015-05-25 NOTE — Patient Instructions (Addendum)
You are doing well.  Trade the plavix for effient for a few weeks to see if your nose gets better  Please call us if you have new issues that need to be addressed before your next appt.  Your physician wants you to follow-up in: 6 months.  You will receive a reminder letter in the mail two months in advance. If you don't receive a letter, please call our office to schedule the follow-up appointment.

## 2015-05-25 NOTE — Progress Notes (Signed)
Patient ID: [name], female    DOB: [birth year], 75 y.o.   MRN: [id number]  HPI Comments: 75 year old female with history of hepatitis C, osteoporosis, arthritis, chronic back pain s/p multiple fusions, and chronic UTI's, ischemic cardiomyopathy with ejection fraction 25% on echocardiogram November 2016, coronary artery disease with PCI to the mid RCA, proximal LAD August 2016, who presents for routine follow-up of her cardiomyopathy  In follow-up today, she feels that the Plavix is causing her nose to run. Previously she felt Lasix was causing her runny nose. She was unable to tolerate brilinta She is having side effects from the entresto, only able to tolerate 1/2 dose of the 24/26 mg pill once a day in the evening. Able to tolerate metoprolol, Lipitor, potassium Takes one half torsemide pill every other day 3 days per week. Reports her weight has been stable, blood pressure has been low  She reports that she is exercising on a treadmill daily  She is not interested in defibrillator She did consider applying external defibrillator to put in their house  Heart rate typically runs 70s to 80s at home Weight at home typically 101 up to 102 pounds  Other past medical history reviewed [facility] 10/25/14 to 8/26 for new onset acute systolic CHF, ischemic cardiomyopathy, acute respiratory distress with hypoxia, and newly diagnosed CAD s/p PCI/DES to mid RCA and prox LAD.     Admitted to [facility] in August with acute respiraotry destress with hypoxia   Echo August 2016 showed EF of 20%, akinesis of the inferior, inferoseptal, distal anterior, and apical walls. There was hypokinesis elsewhere. Cavity size was mildly dilated. Wall thickness was normal. Mild aortic regurgitation. Mild to moderate mitral regurgitation. Left atrium was mildly dilated. Right ventricle systolic was was moderate to severely reduced. A small pericardial effusion was seen.    cardiac cath on 8/25 that showed ostial LAD 30%,  prox LAD 90%, ostial RCA 30%, mid RCA 90%, ostial Ramus 50%.  She underwent successful PCI/DES to the proximal LAD and mid RCA with Xience DES. She was started on aspirin and Brilinta.   she could not tolerated Brilinta 2/2 SOB. She was changed to Plavix.        Allergies  Allergen Reactions  . Sugar-Protein-Starch Nausea And Vomiting    Only to sugar    Current Outpatient Prescriptions on File Prior to Visit  Medication Sig Dispense Refill  . aspirin EC 81 MG tablet Take 81 mg by mouth daily.    Marland Kitchen atorvastatin (LIPITOR) 80 MG tablet TAKE 1 TABLET (80 MG TOTAL) BY MOUTH DAILY AT 6 PM. 30 tablet 6  . Calcium-Vitamin A-Vitamin D (LIQUID CALCIUM PO) Take by mouth. 1 tablespoon daily    . cholecalciferol (VITAMIN D) 1000 UNITS tablet Take 1,000 Units by mouth daily.    . clonazePAM (KLONOPIN) 0.5 MG tablet Take 1 tablet (0.5 mg total) by mouth at bedtime. 30 tablet 0  . clopidogrel (PLAVIX) 75 MG tablet Take 1 tablet (75 mg total) by mouth daily. 30 tablet 11  . denosumab (PROLIA) 60 MG/ML SOLN injection Inject 60 mg into the skin every 6 (six) months. 1 Syringe 0  . metoprolol succinate (TOPROL XL) 25 MG 24 hr tablet Take 1 tablet (25 mg total) by mouth at bedtime. 30 tablet 6  . ondansetron (ZOFRAN-ODT) 4 MG disintegrating tablet TAKE 1 TABLET BY MOUTH TWICE A DAY AS NEEDED FOR NAUSEA AND VOMITING 60 tablet 0  . potassium chloride (K-DUR) 10 MEQ tablet  Take 1 tablet (10 mEq total) by mouth every other day. (Patient taking differently: Take 10 mEq by mouth daily. ) 30 tablet 6  . sacubitril-valsartan (ENTRESTO) 24-26 MG Take 1 tablet by mouth 2 (two) times daily. 60 tablet 11  . torsemide (DEMADEX) 20 MG tablet Take 0.5 tablets (10 mg total) by mouth every other day. 90 tablet 3   No current facility-administered medications on file prior to visit.    Past Medical History  Diagnosis Date  . UTI (lower urinary tract infection)   . Arthritis   . Osteoporosis   . Hepatitis C   .  Chronic systolic CHF (congestive heart failure) ([place])     a. echo 10/2014: EF 20%, AK inf, inf-sep, distal ant, & apical walls, HK elsewhere. Cavity size mildly dilated. Wall thickness nl. mild AI. Mild to mod MR. LA mildly dilated. RV sys fxn mod to severely reduced. small pericardial effusion was ID'd.   Marland Kitchen CAD (coronary artery disease)     a. cardiac cath 10/2014: ostial LAD 30%, prox LAD 90% s/p PCI/DES, ostial RCA 30%, mid RCA 90% s/p PCI/DES, ostial Ramus 50%  . Ischemic cardiomyopathy   . Chronic back pain   . Chronic fatigue     Past Surgical History  Procedure Laterality Date  . Back surgery  07/14    T10, L1  . Cardiac catheterization Bilateral 10/27/2014    Procedure: Left Heart Cath and Coronary Angiography;  Surgeon: Minna Merritts, MD;  Location: [place];  Service: Cardiovascular;  Laterality: Bilateral;  . Cardiac catheterization N/A 10/27/2014    Procedure: Coronary Stent Intervention;  Surgeon: Wellington Hampshire, MD;  Location: [place] [facility];  Service: Cardiovascular;  Laterality: N/A;    Social History  reports that she has quit smoking. She has never used smokeless tobacco. She reports that she does not drink alcohol or use illicit drugs.  Family History family history includes Arthritis in her mother; Diabetes in her father; Heart disease in her father.    Review of Systems  Respiratory: Negative.   Cardiovascular: Negative.   Gastrointestinal: Negative.   Musculoskeletal: Positive for back pain.  Neurological: Positive for weakness.  Hematological: Negative.   Psychiatric/Behavioral: Negative.   All other systems reviewed and are negative.   BP 110/64 mmHg  Pulse 72  Ht 5' (1.524 m)  Wt 99 lb 12 oz (45.246 kg)  BMI 19.48 kg/m2  Physical Exam  Constitutional: She is oriented to person, place, and time.  Thin, kyphosis  HENT:  Head: Normocephalic.  Nose: Nose normal.  Mouth/Throat: Oropharynx is clear and moist.  Eyes: Conjunctivae  are normal. Pupils are equal, round, and reactive to light.  Neck: Normal range of motion. Neck supple. No JVD present.  Cardiovascular: Normal rate, regular rhythm, normal heart sounds and intact distal pulses.  Exam reveals no gallop and no friction rub.   No murmur heard. Pulmonary/Chest: Effort normal and breath sounds normal. No respiratory distress. She has no wheezes. She has no rales. She exhibits no tenderness.  Abdominal: Soft. Bowel sounds are normal. She exhibits no distension. There is no tenderness.  Musculoskeletal: Normal range of motion. She exhibits no edema or tenderness.  Lymphadenopathy:    She has no cervical adenopathy.  Neurological: She is alert and oriented to person, place, and time. Coordination normal.  Skin: Skin is warm and dry. No rash noted. No erythema.  Psychiatric: She has a normal mood and affect. Her behavior is normal. Judgment and  thought content normal.

## 2015-05-25 NOTE — Assessment & Plan Note (Signed)
She appears relatively euvolemic on today's visit Long discussion concerning her medications, her inability to tolerate various medications She has indicated that she does not want defibrillator for various reasons including the risk of complications.   Total encounter time more than 25 minutes  Greater than 50% was spent in counseling and coordination of care with the patient

## 2015-05-25 NOTE — Assessment & Plan Note (Signed)
She is not interested in other medication changes at this time Feels she is having too many side effects from the medications. Recommended she stay on her current regiment, continue her exercise She is not interested in defibrillator

## 2015-05-25 NOTE — Assessment & Plan Note (Signed)
Currently with no symptoms of angina. No further workup at this time.  At her request, we will change her from Plavix to samples of effient to see if this helps her runny nose If no improvement, recommended she go back to Plavix Stressed the importance of staying on one of the antiplatelet medications for at least one year. She has indicated she would like to stop

## 2015-06-13 ENCOUNTER — Other Ambulatory Visit: Payer: Self-pay | Admitting: Nurse Practitioner

## 2015-06-13 ENCOUNTER — Encounter: Payer: Self-pay | Admitting: Nurse Practitioner

## 2015-06-13 MED ORDER — AZELASTINE HCL 0.15 % NA SOLN: NASAL | Status: DC

## 2015-07-06 ENCOUNTER — Encounter: Payer: Self-pay | Admitting: Nurse Practitioner

## 2015-08-01 ENCOUNTER — Ambulatory Visit: Payer: PPO | Admitting: Nurse Practitioner

## 2015-08-02 ENCOUNTER — Ambulatory Visit: Payer: PPO | Admitting: Nurse Practitioner

## 2015-08-04 ENCOUNTER — Ambulatory Visit (INDEPENDENT_AMBULATORY_CARE_PROVIDER_SITE_OTHER): Payer: PPO | Admitting: Internal Medicine

## 2015-08-04 ENCOUNTER — Encounter: Payer: Self-pay | Admitting: Internal Medicine

## 2015-08-04 VITALS — BP 96/60 | HR 75 | Ht 60.0 in | Wt 103.4 lb

## 2015-08-04 DIAGNOSIS — I10 Essential (primary) hypertension: Secondary | ICD-10-CM

## 2015-08-04 DIAGNOSIS — I25119 Atherosclerotic heart disease of native coronary artery with unspecified angina pectoris: Secondary | ICD-10-CM

## 2015-08-04 DIAGNOSIS — I420 Dilated cardiomyopathy: Secondary | ICD-10-CM

## 2015-08-04 DIAGNOSIS — Z9889 Other specified postprocedural states: Secondary | ICD-10-CM

## 2015-08-04 DIAGNOSIS — M810 Age-related osteoporosis without current pathological fracture: Secondary | ICD-10-CM

## 2015-08-04 DIAGNOSIS — Z8781 Personal history of (healed) traumatic fracture: Secondary | ICD-10-CM

## 2015-08-04 LAB — COMPREHENSIVE METABOLIC PANEL
ALK PHOS: 56 U/L (ref 33–130)
ALT: 29 U/L (ref 6–29)
AST: 28 U/L (ref 10–35)
Albumin: 4.2 g/dL (ref 3.6–5.1)
BILIRUBIN TOTAL: 0.5 mg/dL (ref 0.2–1.2)
BUN: 32 mg/dL — AB (ref 7–25)
CO2: 30 mmol/L (ref 20–31)
CREATININE: 0.91 mg/dL (ref 0.60–0.93)
Calcium: 9.7 mg/dL (ref 8.6–10.4)
Chloride: 101 mmol/L (ref 98–110)
GLUCOSE: 99 mg/dL (ref 65–99)
POTASSIUM: 5.7 mmol/L — AB (ref 3.5–5.3)
SODIUM: 139 mmol/L (ref 135–146)
TOTAL PROTEIN: 6.5 g/dL (ref 6.1–8.1)

## 2015-08-04 MED ORDER — CLONAZEPAM 0.5 MG PO TABS: 0.5000 mg | ORAL_TABLET | Freq: Every day | ORAL | Status: DC

## 2015-08-04 MED ORDER — ONDANSETRON 4 MG PO TBDP: ORAL_TABLET | ORAL | Status: DC

## 2015-08-04 NOTE — Assessment & Plan Note (Signed)
Reviewed ECHO from 2016. Discussed pros and cons of ICD. Appears euvolemic on exam and is symptomatically doing well. Will continue current medications.

## 2015-08-04 NOTE — Assessment & Plan Note (Signed)
BP Readings from Last 3 Encounters:  08/04/15 96/60  05/25/15 110/64  04/06/15 118/64   BP well controlled. She is interested in weaning off medications. Encouraged her to follow up and discuss this with Dr. Rockey Situ.

## 2015-08-04 NOTE — Progress Notes (Signed)
Subjective:    Patient ID: [name], female    DOB: 03/04/1941, 75 y.o.   MRN: [id number]  HPI  75YO female presents to establish care. Previously seen by Lorane Gell.  Osteoporosis - history of multiple vertebral fractures. S/p kyphoplasty. No longer taking pain medication. Taking Prolia, next due in August.  CHF - Followed by Dr. Rockey Situ. Last ECHO, showed EF of 20-25%. Has follow up in August. Declined implanted defibrillator. Considering home AED. Feeling well. Walking on a treadmill about 36min daily. No chest pain. Mild LE swelling noted, unchanged. Left leg slightly worse than right.   Wt Readings from Last 3 Encounters:  08/04/15 103 lb 6.4 oz (46.902 kg)  05/25/15 99 lb 12 oz (45.246 kg)  04/06/15 102 lb (46.267 kg)   BP Readings from Last 3 Encounters:  08/04/15 96/60  05/25/15 110/64  04/06/15 118/64    Past Medical History  Diagnosis Date  . UTI (lower urinary tract infection)   . Arthritis   . Osteoporosis   . Hepatitis C   . Chronic systolic CHF (congestive heart failure) ([place])     a. echo 10/2014: EF 20%, AK inf, inf-sep, distal ant, & apical walls, HK elsewhere. Cavity size mildly dilated. Wall thickness nl. mild AI. Mild to mod MR. LA mildly dilated. RV sys fxn mod to severely reduced. small pericardial effusion was ID'd.   Marland Kitchen CAD (coronary artery disease)     a. cardiac cath 10/2014: ostial LAD 30%, prox LAD 90% s/p PCI/DES, ostial RCA 30%, mid RCA 90% s/p PCI/DES, ostial Ramus 50%  . Ischemic cardiomyopathy   . Chronic back pain   . Chronic fatigue    Family History  Problem Relation Age of Onset  . Arthritis Mother   . Heart disease Father     passed at 39  . Diabetes Father    Past Surgical History  Procedure Laterality Date  . Back surgery  07/14    T10, L1  . Cardiac catheterization Bilateral 10/27/2014    Procedure: Left Heart Cath and Coronary Angiography;  Surgeon: Minna Merritts, MD;  Location: [facility];  Service:  Cardiovascular;  Laterality: Bilateral;  . Cardiac catheterization N/A 10/27/2014    Procedure: Coronary Stent Intervention;  Surgeon: Wellington Hampshire, MD;  Location: [facility];  Service: Cardiovascular;  Laterality: N/A;   Social History   Social History  . Marital Status: Married    Spouse Name: Jalonda Wilmott  . Number of Children: 0  . Years of Education: 14   Occupational History  . Receptionist     Retired in [place]  . Smoking status: Former Smoker -- 1.00 packs/day for 10 years  . Smokeless tobacco: Never Used  . Alcohol Use: No  . Drug Use: No  . Sexual Activity: Yes    Birth Control/ Protection: Post-menopausal   Other Topics Concern  . None   Social History Narrative   Pt is 74 yo female, married to husband of 21 years.  Pt has no children.  Pt lives with husband and teacup poodle, [name]. Pt states she hurt her back last year and has not been able to participate in much physical activity since. Pt enjoys traveling with her husband in motor home, doing crossword puzzles with husband, and playing cards with friends.  Pt was raised in [facility] [place] and has lived in [place] entire adult life.  Pt worked in [employer] in [employer]  until 1990 when they closed.      Review of Systems  Constitutional: Negative for fever, chills, appetite change, fatigue and unexpected weight change.  Eyes: Negative for visual disturbance.  Respiratory: Negative for cough, chest tightness and shortness of breath.   Cardiovascular: Positive for leg swelling. Negative for chest pain and palpitations.  Gastrointestinal: Negative for nausea, vomiting, abdominal pain, diarrhea, constipation and blood in stool.  Musculoskeletal: Positive for gait problem (uses cane). Negative for myalgias, joint swelling and arthralgias.  Skin: Negative for color change and rash.  Neurological: Negative for weakness.  Hematological: Negative for adenopathy. Does not  bruise/bleed easily.  Psychiatric/Behavioral: Negative for suicidal ideas, sleep disturbance and dysphoric mood. The patient is not nervous/anxious.        Objective:    BP 96/60 mmHg  Pulse 75  Ht 5' (1.524 m)  Wt 103 lb 6.4 oz (46.902 kg)  BMI 20.19 kg/m2  SpO2 97% Physical Exam  Constitutional: She is oriented to person, place, and time. She appears well-developed and well-nourished. No distress.  HENT:  Head: Normocephalic and atraumatic.  Right Ear: External ear normal.  Left Ear: External ear normal.  Nose: Nose normal.  Mouth/Throat: Oropharynx is clear and moist. No oropharyngeal exudate.  Eyes: Conjunctivae are normal. Pupils are equal, round, and reactive to light. Right eye exhibits no discharge. Left eye exhibits no discharge. No scleral icterus.  Neck: Normal range of motion. Neck supple. No tracheal deviation present. No thyromegaly present.  Cardiovascular: Normal rate, regular rhythm, normal heart sounds and intact distal pulses.  Exam reveals no gallop and no friction rub.   No murmur heard. Pulmonary/Chest: Effort normal and breath sounds normal. No respiratory distress. She has no wheezes. She has no rales. She exhibits no tenderness.  Musculoskeletal: Normal range of motion. She exhibits edema (trace left lower leg to mid shin). She exhibits no tenderness.  Lymphadenopathy:    She has no cervical adenopathy.  Neurological: She is alert and oriented to person, place, and time. No cranial nerve deficit. She exhibits normal muscle tone. Coordination normal.  Skin: Skin is warm and dry. No rash noted. She is not diaphoretic. No erythema. No pallor.  Psychiatric: She has a normal mood and affect. Her behavior is normal. Judgment and thought content normal.          Assessment & Plan:   Problem List Items Addressed This Visit      Unprioritized   Congestive dilated cardiomyopathy ([place]) - Primary    Reviewed ECHO from 2016. Discussed pros and cons of ICD.  Appears euvolemic on exam and is symptomatically doing well. Will continue current medications.      Relevant Orders   Comprehensive metabolic panel   Coronary artery disease involving native coronary artery of native heart with angina pectoris ([place])    Currently asymptomatic. S/p stent to RCA and LAD. Encouraged compliance with medications.      Essential hypertension    BP Readings from Last 3 Encounters:  08/04/15 96/60  05/25/15 110/64  04/06/15 118/64   BP well controlled. She is interested in weaning off medications. Encouraged her to follow up and discuss this with Dr. Rockey Situ.      Osteoporosis    On Prolia. Last Dexa in 01/2014. Plan repeat in 01/2016.       S/P repair of vertebral fracture    S/p kyphoplasty. Feeling well without pain medications. Continue Prolia. Next Dexa in 01/2014          Return  in about 3 months (around 11/04/2015) for Recheck.  Ronette Deter, MD [facility]

## 2015-08-04 NOTE — Assessment & Plan Note (Signed)
S/p kyphoplasty. Feeling well without pain medications. Continue Prolia. Next Dexa in 01/2014

## 2015-08-04 NOTE — Patient Instructions (Signed)
Labs today.   Follow up in 3 months.  

## 2015-08-04 NOTE — Assessment & Plan Note (Signed)
On Prolia. Last Dexa in 01/2014. Plan repeat in 01/2016.

## 2015-08-04 NOTE — Assessment & Plan Note (Signed)
Currently asymptomatic. S/p stent to RCA and LAD. Encouraged compliance with medications.

## 2015-08-07 ENCOUNTER — Encounter: Payer: Self-pay | Admitting: Internal Medicine

## 2015-08-07 ENCOUNTER — Telehealth: Payer: Self-pay | Admitting: *Deleted

## 2015-08-07 ENCOUNTER — Other Ambulatory Visit (INDEPENDENT_AMBULATORY_CARE_PROVIDER_SITE_OTHER): Payer: PPO

## 2015-08-07 DIAGNOSIS — E875 Hyperkalemia: Secondary | ICD-10-CM

## 2015-08-07 LAB — BASIC METABOLIC PANEL
BUN: 32 mg/dL — AB (ref 6–23)
CHLORIDE: 106 meq/L (ref 96–112)
CO2: 29 meq/L (ref 19–32)
CREATININE: 0.88 mg/dL (ref 0.40–1.20)
Calcium: 9.3 mg/dL (ref 8.4–10.5)
GFR: 66.79 mL/min (ref >60.00)
GLUCOSE: 123 mg/dL — AB (ref 70–99)
POTASSIUM: 5.1 meq/L (ref 3.5–5.1)
Sodium: 141 mEq/L (ref 135–145)

## 2015-08-07 NOTE — Telephone Encounter (Signed)
Pt walked in for labs. Need orders (I drew one SST)

## 2015-08-07 NOTE — Telephone Encounter (Signed)
BMP for hyperkalemia 

## 2015-08-07 NOTE — Telephone Encounter (Signed)
Order placed

## 2015-08-08 NOTE — Telephone Encounter (Signed)
Patient was already informed of results.  Patient understood and no questions, comments, or concerns at this time.  

## 2015-08-10 ENCOUNTER — Other Ambulatory Visit: Payer: Self-pay | Admitting: Nurse Practitioner

## 2015-09-08 ENCOUNTER — Telehealth: Payer: Self-pay

## 2015-09-08 ENCOUNTER — Encounter: Payer: Self-pay | Admitting: Family Medicine

## 2015-09-08 NOTE — Telephone Encounter (Signed)
Can we start the Prolia process for her again, due in August for injection, thanks

## 2015-09-08 NOTE — Telephone Encounter (Signed)
Please advise 

## 2015-09-11 NOTE — Telephone Encounter (Signed)
Noted, thanks!

## 2015-09-11 NOTE — Telephone Encounter (Signed)
I have electronically submitted pt's info for [employer] insurance verification and will notify you once I have a response. Thank you. °

## 2015-09-20 NOTE — Telephone Encounter (Signed)
Patient is wanting to come in and get her injection, can you please see if answer is back yet. thanks

## 2015-09-25 NOTE — Telephone Encounter (Signed)
I have rec'd Ms. Hassebrock's insurance verification for Prolia and her HTA is requiring a prior authorization.  I have completed the p/a form and faxed to [employer] along w/the bone density from 01/10/2014.  I will notify you once I have a response.  Thank you!

## 2015-09-26 NOTE — Telephone Encounter (Signed)
Noted thanks °

## 2015-10-03 NOTE — Telephone Encounter (Signed)
noted 

## 2015-10-03 NOTE — Telephone Encounter (Signed)
I have contacted [employer] in ref to the prior auth and they say they have not rec'd the required info to process it.  I re-faxed all the info again and will let you know as soon as I hear from them.   Thank you!

## 2015-10-09 NOTE — Telephone Encounter (Signed)
Patient requested a update on the prolia vaccine Pt contact [phone]

## 2015-10-09 NOTE — Telephone Encounter (Signed)
Not yet, but I will contact them shortly.  Thanks!

## 2015-10-09 NOTE — Telephone Encounter (Signed)
Do you have an update on her verification? thanks

## 2015-10-23 ENCOUNTER — Other Ambulatory Visit: Payer: Self-pay | Admitting: Cardiovascular Disease

## 2015-10-24 NOTE — Telephone Encounter (Signed)
Brynda Rim, I think this telephone note was sent to me by mistake. Thanks!

## 2015-10-24 NOTE — Telephone Encounter (Signed)
Jinny Blossom, it was and I apologize. Thank you for letting me know!  Lavella Lemons, this should have went to you.  Please previous phone note.

## 2015-10-24 NOTE — Telephone Encounter (Signed)
I have not yet rec'd Ms. Test's written authorization for Prolia so I called [employer] and per Anderson Malta she has been approved.  Approval [id number] is effective 11/02/2015-05/02/2016 (Aug. 31st is not a typo).

## 2015-10-24 NOTE — Telephone Encounter (Signed)
This is an addition to the previous note (not sure why it closed before I completed it).  W/out an OV pt will have an estimated responsibility of $195; w/an OV her estimated responsibility will be $205.  Please make pt aware this is an estimate and we will not know an exact amt until insurance(s) has/have paid.  I have sent a copy of the summary of benefits to be scanned into pt's chart.    If pt cannot afford $195-$205 for her injection, please advise her to contact Prolia at [phone] and select option #1 to see if she qualifies for one of their assistance programs.  If she qualifies they will instruct her how to proceed.  Once pt recs injection, please let me know actual injection date so I can update the Prolia portal.  If you have any questions, please let me know.  Thank you!

## 2015-10-25 ENCOUNTER — Ambulatory Visit (INDEPENDENT_AMBULATORY_CARE_PROVIDER_SITE_OTHER): Payer: PPO

## 2015-10-25 DIAGNOSIS — M810 Age-related osteoporosis without current pathological fracture: Secondary | ICD-10-CM

## 2015-10-25 MED ORDER — DENOSUMAB 60 MG/ML ~~LOC~~ SOLN
60.0000 mg | Freq: Once | SUBCUTANEOUS | Status: AC
  Administered 2015-10-25: 60 mg via SUBCUTANEOUS

## 2015-10-25 NOTE — Progress Notes (Signed)
Care was provided under my supervision. I agree with the management as indicated in the note.  Cheetara Hoge DO  

## 2015-10-25 NOTE — Progress Notes (Signed)
Patient came in for Prolia injection.  Received in Left arm, [place]. Patient tolerated well.

## 2015-10-31 ENCOUNTER — Other Ambulatory Visit: Payer: Self-pay | Admitting: *Deleted

## 2015-10-31 MED ORDER — CLOPIDOGREL BISULFATE 75 MG PO TABS: 75.0000 mg | ORAL_TABLET | Freq: Every day | ORAL | 11 refills | Status: DC

## 2015-10-31 NOTE — Telephone Encounter (Signed)
Refilled clopidogrel 75 mg  #30 with 11 refills.

## 2015-11-08 ENCOUNTER — Ambulatory Visit: Payer: PPO | Admitting: Internal Medicine

## 2015-11-08 ENCOUNTER — Other Ambulatory Visit: Payer: Self-pay | Admitting: Cardiovascular Disease

## 2015-11-21 ENCOUNTER — Ambulatory Visit (INDEPENDENT_AMBULATORY_CARE_PROVIDER_SITE_OTHER): Payer: PPO | Admitting: Cardiovascular Disease

## 2015-11-21 ENCOUNTER — Encounter: Payer: Self-pay | Admitting: Cardiovascular Disease

## 2015-11-21 VITALS — BP 130/68 | HR 91 | Ht 60.0 in | Wt 103.8 lb

## 2015-11-21 DIAGNOSIS — I5023 Acute on chronic systolic (congestive) heart failure: Secondary | ICD-10-CM

## 2015-11-21 DIAGNOSIS — I255 Ischemic cardiomyopathy: Secondary | ICD-10-CM

## 2015-11-21 DIAGNOSIS — E785 Hyperlipidemia, unspecified: Secondary | ICD-10-CM

## 2015-11-21 DIAGNOSIS — I2510 Atherosclerotic heart disease of native coronary artery without angina pectoris: Secondary | ICD-10-CM | POA: Diagnosis not present

## 2015-11-21 DIAGNOSIS — I5022 Chronic systolic (congestive) heart failure: Secondary | ICD-10-CM

## 2015-11-21 DIAGNOSIS — I10 Essential (primary) hypertension: Secondary | ICD-10-CM | POA: Diagnosis not present

## 2015-11-21 MED ORDER — ATORVASTATIN CALCIUM 80 MG PO TABS: ORAL_TABLET | ORAL | 3 refills | Status: DC

## 2015-11-21 MED ORDER — METOPROLOL SUCCINATE ER 25 MG PO TB24: 25.0000 mg | ORAL_TABLET | Freq: Every day | ORAL | 3 refills | Status: DC

## 2015-11-21 MED ORDER — CLOPIDOGREL BISULFATE 75 MG PO TABS: 75.0000 mg | ORAL_TABLET | Freq: Every day | ORAL | 3 refills | Status: DC

## 2015-11-21 MED ORDER — FUROSEMIDE 20 MG PO TABS: 20.0000 mg | ORAL_TABLET | ORAL | 3 refills | Status: DC

## 2015-11-21 MED ORDER — POTASSIUM CHLORIDE ER 10 MEQ PO TBCR: 10.0000 meq | EXTENDED_RELEASE_TABLET | ORAL | 3 refills | Status: DC

## 2015-11-21 NOTE — Patient Instructions (Signed)

## 2015-11-21 NOTE — Progress Notes (Signed)
Cardiology Office Note  Date:  11/21/2015   ID:  SENNIE BORDEN, DOB [birth year], MRN [id number]  PCP:  Coral Spikes, DO   Chief Complaint  Patient presents with  . other    6 month follow up. Meds reviewed by the pt. verbally. "doing well."     HPI:  75 year old female with history of hepatitis C, osteoporosis, arthritis, chronic back pain s/p multiple fusions, and chronic UTI's, ischemic cardiomyopathy with ejection fraction 25% on echocardiogram November 2016, coronary artery disease with PCI to the mid RCA, proximal LAD August 2016, who presents for routine follow-up of her cardiomyopathy  In follow-up today, she reports that she feels well, "never better " On lasix every 5th day, taking  entresto one half pill once a day  Tolerating metoprolol daily  Her medication lists states she is taking torsemide that she reports that she is not taking this, but is taking Lasix  Reports her weight is stable Previously was doing some exercises on a treadmill  Chronic back pain limiting her ability to exercise Husband does most of the housework and ADLs, takes care of her She denies any leg edema, shortness of breath on exertion  EKG on today's visit shows normal sinus rhythm, left bundle branch block, rate 91 bpm  Other past medical history  She previously felt  Plavix was  causing her nose to run.  Also Previously she felt Lasix was causing her runny nose. now taking Lasix, not taking torsemide  She was unable to tolerate brilinta She was having side effects from the entresto, only able to tolerate 1/2 dose of the 24/26 mg pill once a day in the evening. Able to tolerate metoprolol, Lipitor, potassium  She is not interested in defibrillator. We've had previous discussions She did consider applying external defibrillator to put in their house  Heart rate typically runs 70s to 80s at home Weight at home typically 101 up to 102 pounds  Other past medical history reviewed [facility] 10/25/14  to 8/26 for new onset acute systolic CHF, ischemic cardiomyopathy, acute respiratory distress with hypoxia, and newly diagnosed CAD s/p PCI/DES to mid RCA and prox LAD.     Ad[facility] in August with acute respiraotry destress with hypoxia   Echo August 2016 showed EF of 20%, akinesis of the inferior, inferoseptal, distal anterior, and apical walls. There was hypokinesis elsewhere. Cavity size was mildly dilated. Wall thickness was normal. Mild aortic regurgitation. Mild to moderate mitral regurgitation. Left atrium was mildly dilated. Right ventricle systolic was was moderate to severely reduced. A small pericardial effusion was seen.    cardiac cath on 8/25 that showed ostial LAD 30%, prox LAD 90%, ostial RCA 30%, mid RCA 90%, ostial Ramus 50%.  She underwent successful PCI/DES to the proximal LAD and mid RCA with Xience DES. She was started on aspirin and Brilinta.   she could not tolerated Brilinta 2/2 SOB. She was changed to Plavix.    PMH:   has a past medical history of Arthritis; CAD (coronary artery disease); Chronic back pain; Chronic fatigue; Chronic systolic CHF (congestive heart failure) ([place]); Hepatitis C; Ischemic cardiomyopathy; Osteoporosis; and UTI (lower urinary tract infection).  PSH:    Past Surgical History:  Procedure Laterality Date  . BACK SURGERY  07/14   T10, L1  . CARDIAC CATHETERIZATION Bilateral 10/27/2014   Procedure: Left Heart Cath and Coronary Angiography;  Surgeon: Minna Merritts, MD;  Location: [facility];  Service: Cardiovascular;  Laterality: Bilateral;  . CARDIAC CATHETERIZATION N/A 10/27/2014   Procedure: Coronary Stent Intervention;  Surgeon: Wellington Hampshire, MD;  Location: [facility];  Service: Cardiovascular;  Laterality: N/A;    Current Outpatient Prescriptions  Medication Sig Dispense Refill  . aspirin EC 81 MG tablet Take 81 mg by mouth daily.    Marland Kitchen atorvastatin (LIPITOR) 80 MG tablet TAKE 1 TABLET (80 MG TOTAL) BY  MOUTH DAILY AT 6 PM. 90 tablet 3  . Azelastine HCl 0.15 % SOLN 1-2 sprays in each nostril once daily 30 mL 1  . Calcium-Vitamin A-Vitamin D (LIQUID CALCIUM PO) Take by mouth. 1 tablespoon daily    . cholecalciferol (VITAMIN D) 1000 UNITS tablet Take 1,000 Units by mouth daily.    . clopidogrel (PLAVIX) 75 MG tablet Take 1 tablet (75 mg total) by mouth daily. 90 tablet 3  . denosumab (PROLIA) 60 MG/ML SOLN injection Inject 60 mg into the skin every 6 (six) months. 1 Syringe 0  . metoprolol succinate (TOPROL-XL) 25 MG 24 hr tablet Take 1 tablet (25 mg total) by mouth at bedtime. 90 tablet 3  . ondansetron (ZOFRAN-ODT) 4 MG disintegrating tablet TAKE 1 TABLET BY MOUTH TWICE A DAY AS NEEDED FOR NAUSEA AND VOMITING 60 tablet 3  . potassium chloride (K-DUR) 10 MEQ tablet Take 1 tablet (10 mEq total) by mouth every other day. 45 tablet 3  . sacubitril-valsartan (ENTRESTO) 24-26 MG Take 1 tablet by mouth 2 (two) times daily. 60 tablet 11  . furosemide (LASIX) 20 MG tablet Take 1 tablet (20 mg total) by mouth every other day. 45 tablet 3   No current facility-administered medications for this visit.      Allergies:   Sugar-protein-starch   Social History:  The patient  reports that she has quit smoking. She has a 10.00 pack-year smoking history. She has never used smokeless tobacco. She reports that she does not drink alcohol or use drugs.   Family History:   family history includes Arthritis in her mother; Diabetes in her father; Heart disease in her father.    Review of Systems: Review of Systems  Constitutional: Negative.   Respiratory: Negative.   Cardiovascular: Negative.   Gastrointestinal: Negative.   Musculoskeletal: Positive for back pain and joint pain.  Neurological: Negative.   Psychiatric/Behavioral: Negative.   All other systems reviewed and are negative.    PHYSICAL EXAM: VS:  BP 130/68 (BP Location: Left Arm, Patient Position: Sitting, Cuff Size: Normal)   Pulse 91   Ht  5' (1.524 m)   Wt 103 lb 12 oz (47.1 kg)   BMI 20.26 kg/m  , BMI Body mass index is 20.26 kg/m. GEN:  in no acute distress , thin, frail HEENT: normal  Neck: no JVD, carotid bruits, or masses Cardiac: RRR; 2+ SEM RSB,  No rubs, or gallops,no edema  Respiratory:  clear to auscultation bilaterally, normal work of breathing GI: soft, nontender, nondistended, + BS MS: no deformity or atrophy  Skin: warm and dry, no rash Neuro:  Strength and sensation are intact Psych: euthymic mood, full affect    Recent Labs: 11/23/2014: Pro B Natriuretic peptide (BNP) 3,183.0 01/30/2015: Hemoglobin 14.0; Platelets 179.0 08/04/2015: ALT 29 08/07/2015: BUN 32; Creatinine, Ser 0.88; Potassium 5.1; Sodium 141    Lipid Panel Lab Results  Component Value Date   CHOL 136 10/26/2014   HDL 38 (L) 10/26/2014   LDLCALC 78 10/26/2014   TRIG 100 10/26/2014      Wt Readings  from Last 3 Encounters:  11/21/15 103 lb 12 oz (47.1 kg)  08/04/15 103 lb 6.4 oz (46.9 kg)  05/25/15 99 lb 12 oz (45.2 kg)       ASSESSMENT AND PLAN:  Coronary artery disease involving native coronary artery of native heart without angina pectoris - Plan: EKG 12-Lead Currently with no symptoms of angina. No further workup at this time. Continue current medication regimen.  Acute on chronic systolic congestive heart failure (HCC) - Plan: EKG 12-Lead Appears relatively euvolemic Unable to advance her medications secondary to intolerances She does report very low blood pressure at home She does not want to change her medications  Essential hypertension - Plan: EKG 12-Lead Blood pressure recently well controlled She does report typically blood pressure runs barely above 479 systolic at home  Chronic systolic heart failure (HCC) Takes medications sparingly as above We did offer echocardiogram to evaluate left ventricle function, she has declined  Ischemic cardiomyopathy Previous ejection fraction 20%, none recently No further  testing at patient's request No changes to her medications at patient's request  Hyperlipidemia Cholesterol is at goal on the current lipid regimen. No changes to the medications were made.   Total encounter time more than 25 minutes  Greater than 50% was spent in counseling and coordination of care with the patient   Disposition:   F/U  6 months   Orders Placed This Encounter  Procedures  . EKG 12-Lead     Signed, Esmond Plants, M.D., Ph.D. 11/21/2015  [place], [place]

## 2015-11-27 ENCOUNTER — Ambulatory Visit: Payer: PPO | Admitting: Family Medicine

## 2015-11-27 ENCOUNTER — Ambulatory Visit: Payer: PPO | Admitting: Internal Medicine

## 2015-12-11 IMAGING — CR DG ABDOMEN 3V
1 series · 3 of 3 positions shown · non-contrast
Comparison: None.

EXAM:
ABDOMEN SERIES

[Series 1: w chest pa · 0.14mm/px · 3 of 3 slices shown]
[im 1/3]
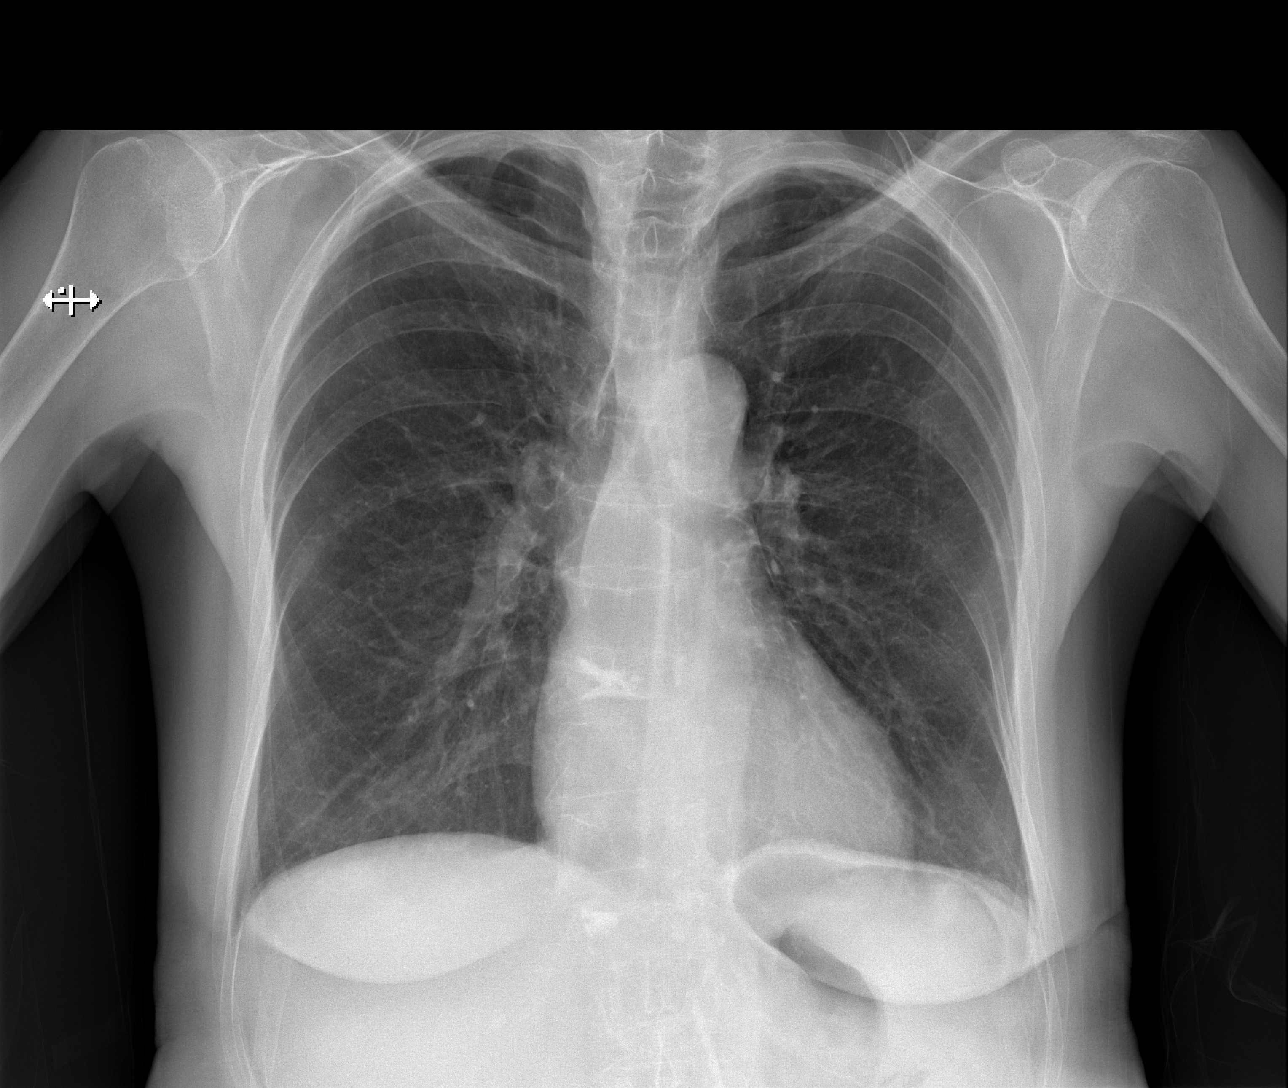
[im 2/3]
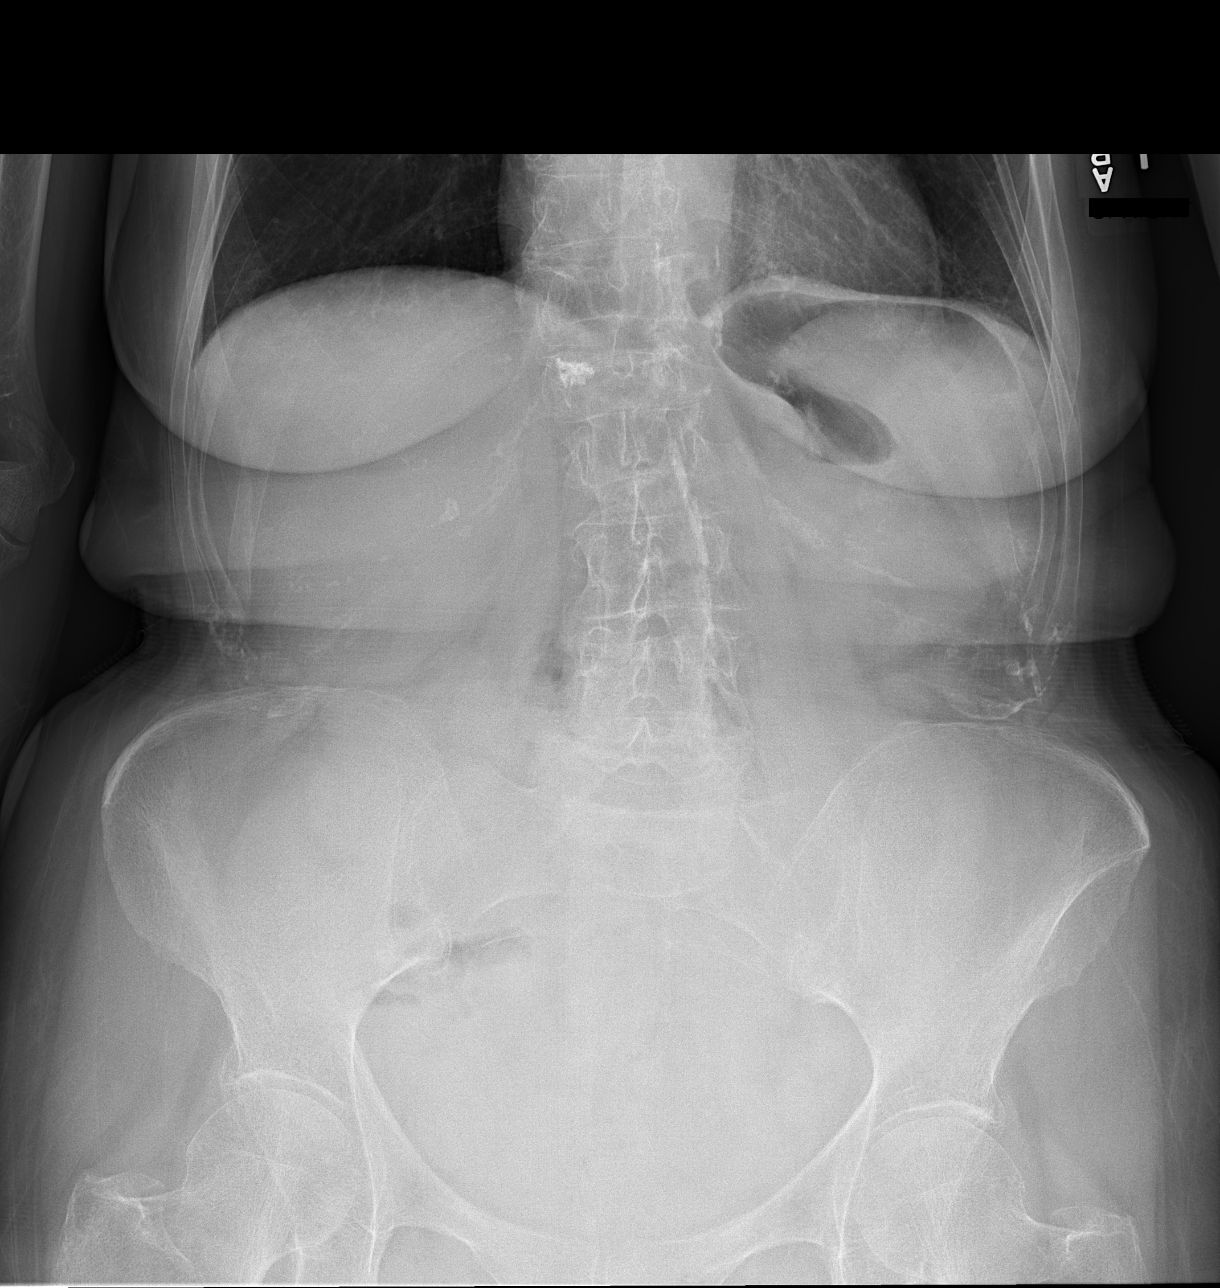
[im 3/3]
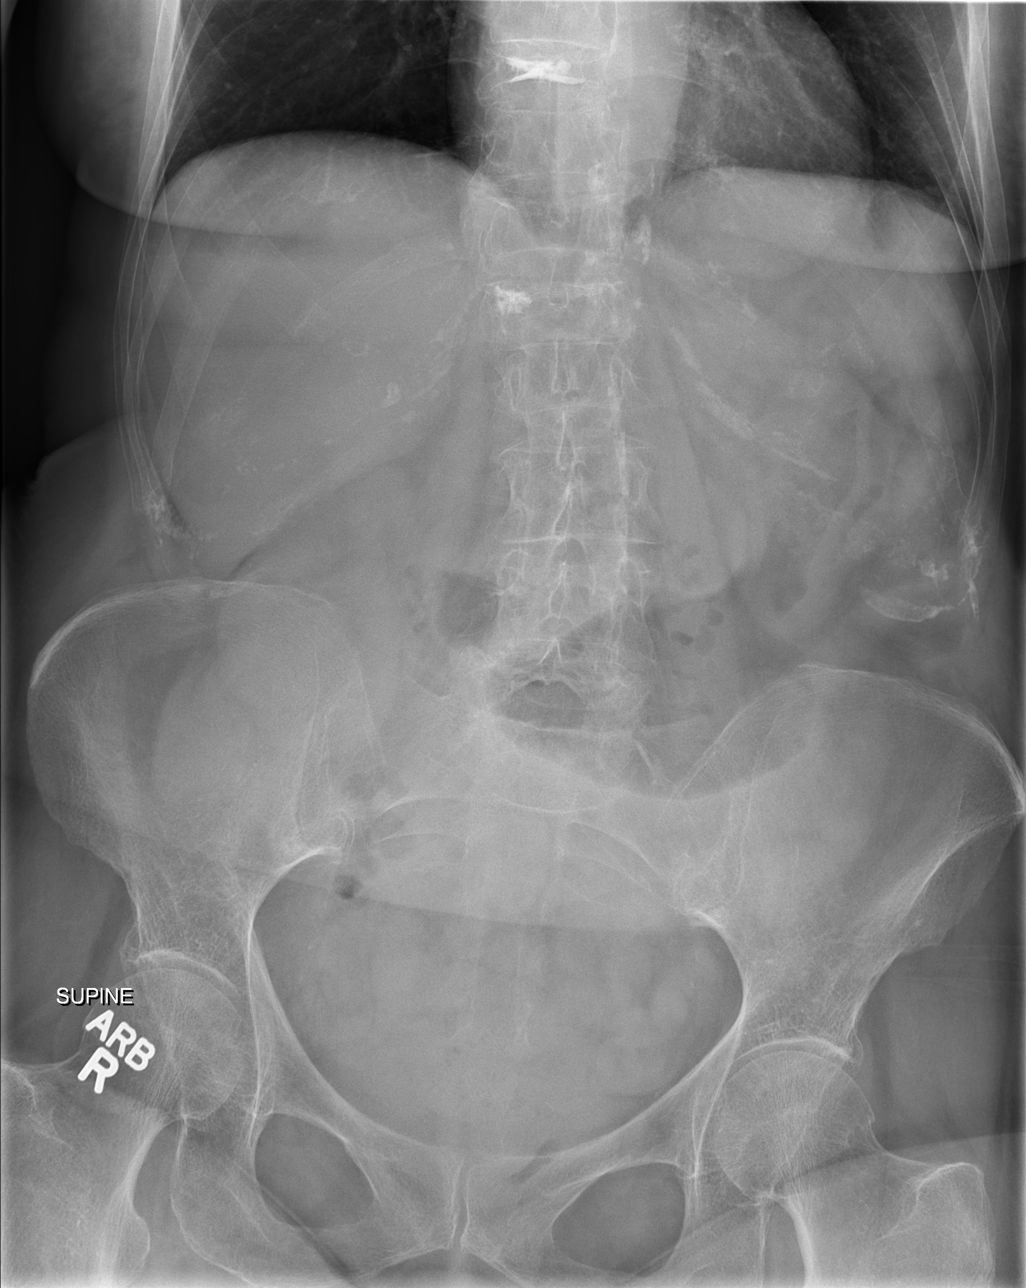

[3 of 3 positions shown; findings below may reference images not displayed]

FINDINGS: There is no evidence of dilated bowel loops or free intraperitoneal
air. No radiopaque calculi or other significant radiographic
abnormality is seen. Heart size and mediastinal contours are within
normal limits. Both lungs are clear. Old thoracolumbar vertebral
compression fractures noted.
IMPRESSION: Negative abdominal radiographs.  No acute cardiopulmonary disease.

## 2015-12-13 ENCOUNTER — Encounter: Payer: Self-pay | Admitting: Family Medicine

## 2015-12-13 ENCOUNTER — Ambulatory Visit (INDEPENDENT_AMBULATORY_CARE_PROVIDER_SITE_OTHER): Payer: PPO | Admitting: Family Medicine

## 2015-12-13 DIAGNOSIS — I2510 Atherosclerotic heart disease of native coronary artery without angina pectoris: Secondary | ICD-10-CM | POA: Diagnosis not present

## 2015-12-13 DIAGNOSIS — Z8619 Personal history of other infectious and parasitic diseases: Secondary | ICD-10-CM

## 2015-12-13 DIAGNOSIS — E785 Hyperlipidemia, unspecified: Secondary | ICD-10-CM

## 2015-12-13 DIAGNOSIS — I10 Essential (primary) hypertension: Secondary | ICD-10-CM | POA: Diagnosis not present

## 2015-12-13 DIAGNOSIS — M810 Age-related osteoporosis without current pathological fracture: Secondary | ICD-10-CM | POA: Diagnosis not present

## 2015-12-13 DIAGNOSIS — I5022 Chronic systolic (congestive) heart failure: Secondary | ICD-10-CM

## 2015-12-13 NOTE — Progress Notes (Signed)
Pre visit review using our clinic review tool, if applicable. No additional management support is needed unless otherwise documented below in the visit note. 

## 2015-12-13 NOTE — Progress Notes (Signed)
Subjective:  Patient ID: [name], female    DOB: [birth year]  Age: 75 y.o. MRN: [id number]  CC: Follow up  HPI:  75 year old female with a complicated past medical history including CHF (last EF 20-25%), CAD s/p PCI of mid RCA & proximal LAD, ostial porosis, hypertension, hyperlipidemia, history of hepatitis C presents for follow-up.  Hypertension  Stable on metoprolol, Entresto, Lasix.  CHF & CAD  Stable.  Patient compliant with medications.  She is followed closely by cardiology.  She has not had an echocardiogram since 2016. She has declined this previously as well as an ICD.  Osteoporosis  Severe.  Has been on Prolia for 2 years.  In need of Dexa.  Hyperlipidemia  Fairly well controlled on Lipitor.  Most recent LDL, however, was not less than 70.  History of Hep C  Patient reports that she contracted hepatitis C via sexual intercourse.  She states that she was seen by a specialist in [place].  She alludes to the fact that she was treated with medication.  There is no documentation of this in the chart. No prior labs.  Will discuss today.  Social Hx   Social History   Social History  . Marital status: Married    Spouse name: [name]  . Number of children: 0  . Years of education: 14   Occupational History  . Receptionist     Retired in [place]  . Smoking status: Former Hernandez    Packs/day: 1.00    Years: 10.00  . Smokeless tobacco: Never Used  . Alcohol use No  . Drug use: No  . Sexual activity: Yes    Birth control/ protection: Post-menopausal   Other Topics Concern  . None   Social History Narrative   Pt is 75 yo female, married to husband of 21 years.  Pt has no children.  Pt lives with husband and teacup poodle, [name]. Pt states she hurt her back last year and has not been able to participate in much physical activity since. Pt enjoys traveling with her husband in motor home, doing crossword  puzzles with husband, and playing cards with friends.  Pt was raised in [facility] [place] and has lived in [place] entire adult life.  Pt worked in [employer] in [place] until [place] when they closed.     Review of Systems  Constitutional: Negative.   Respiratory: Negative for shortness of breath.   Cardiovascular: Negative for chest pain and leg swelling.  Musculoskeletal: Positive for back pain.   Objective:  BP 130/78 (BP Location: Left Arm, Patient Position: Sitting, Cuff Size: Small)   Pulse 84   Temp 97.7 F (36.5 C) (Oral)   Wt 102 lb 12 oz (46.6 kg)   SpO2 94%   BMI 20.07 kg/m   BP/Weight 12/13/2015 09/07/2374 04/11/3149  Systolic BP 761 607 96  Diastolic BP 78 68 60  Wt. (Lbs) 102.75 103.75 103.4  BMI 20.07 20.26 20.19   Physical Exam  Constitutional:  In the elderly female in no acute distress.  Cardiovascular: Normal rate and regular rhythm.   No lower extremity edema.  Pulmonary/Chest: Effort normal and breath sounds normal.  Neurological: She is alert.  Psychiatric:  Flat affect. Abrasive regarding questions.  Vitals reviewed.   Lab Results  Component Value Date   WBC 7.4 01/30/2015   HGB 14.0 01/30/2015   HCT 42.2 01/30/2015   PLT 179.0 01/30/2015   GLUCOSE  123 (H) 08/07/2015   CHOL 136 10/26/2014   TRIG 100 10/26/2014   HDL 38 (L) 10/26/2014   LDLCALC 78 10/26/2014   ALT 29 08/04/2015   AST 28 08/04/2015   NA 141 08/07/2015   K 5.1 08/07/2015   CL 106 08/07/2015   CREATININE 0.88 08/07/2015   BUN 32 (H) 08/07/2015   CO2 29 08/07/2015   TSH 1.951 10/26/2014   INR 1.04 10/26/2014   HGBA1C 5.4 10/26/2014    Assessment & Plan:   Problem List Items Addressed This Visit    CAD (coronary artery disease)    Stable. Continue aspirin, Plavix, Lipitor, Metoprolol, Entrestro.      Relevant Orders   CBC   CHF (congestive heart failure) ([place])    Euvolemic today. Continue Entresto, Lasix, Metoprolol. Recommended echocardiogram and she refused.       Essential hypertension    Stable. Continue Entresto, Lasix, Metoprolol.      Relevant Orders   Comprehensive metabolic panel   CBC   History of hepatitis C    Unsure of status. Suspect chronic hep C. Attempted to draw labs today (failed). NEEDS to have labs to evaluate.      Relevant Orders   Hepatitis C antibody   Hepatitis C RNA quantitative   Hyperlipidemia    Not quite at goal (<70). Patient resistant to changes. Will continue current dose of lipitor.      Relevant Orders   Lipid panel   Osteoporosis    In need of Dexa. Will order. Continue Prolia.      Relevant Orders   DG Bone Density    Other Visit Diagnoses   None.    Follow-up: Return in about 6 months (around 06/12/2016).  [place]

## 2015-12-13 NOTE — Patient Instructions (Signed)
Continue your current medications.  Consider the Echo.  Follow up in 6 months.  Take care  Dr. Lacinda Axon

## 2015-12-13 NOTE — Assessment & Plan Note (Signed)
Not quite at goal (<70). Patient resistant to changes. Will continue current dose of lipitor.

## 2015-12-13 NOTE — Assessment & Plan Note (Signed)
Unsure of status. Suspect chronic hep C. Attempted to draw labs today (failed). NEEDS to have labs to evaluate.

## 2015-12-13 NOTE — Assessment & Plan Note (Signed)
Stable. Continue aspirin, Plavix, Lipitor, Metoprolol, Entrestro.

## 2015-12-13 NOTE — Assessment & Plan Note (Signed)
Stable. Continue Entresto, Lasix, Metoprolol.

## 2015-12-13 NOTE — Assessment & Plan Note (Signed)
Euvolemic today. Continue Entresto, Lasix, Metoprolol. Recommended echocardiogram and she refused.

## 2015-12-13 NOTE — Assessment & Plan Note (Signed)
In need of Dexa. Will order. Continue Prolia.

## 2015-12-14 ENCOUNTER — Encounter: Payer: Self-pay | Admitting: Family Medicine

## 2016-01-29 ENCOUNTER — Ambulatory Visit: Payer: PPO

## 2016-01-31 ENCOUNTER — Encounter: Payer: Self-pay | Admitting: Family Medicine

## 2016-01-31 ENCOUNTER — Ambulatory Visit
Admission: RE | Admit: 2016-01-31 | Discharge: 2016-01-31 | Disposition: A | Discharge status: Home / Self Care | Payer: PPO | Source: Ambulatory Visit | Attending: Family Medicine | Admitting: Family Medicine

## 2016-01-31 DIAGNOSIS — M810 Age-related osteoporosis without current pathological fracture: Secondary | ICD-10-CM | POA: Insufficient documentation

## 2016-02-12 ENCOUNTER — Telehealth: Payer: Self-pay | Admitting: Family Medicine

## 2016-02-12 NOTE — Telephone Encounter (Signed)
Pt declined to get the AWV. Thank you! °

## 2016-03-19 ENCOUNTER — Telehealth: Payer: Self-pay | Admitting: Family Medicine

## 2016-03-19 NOTE — Telephone Encounter (Signed)
Patient due for prolia February, faxed PA to [facility] .

## 2016-04-10 ENCOUNTER — Telehealth: Payer: Self-pay | Admitting: Family Medicine

## 2016-04-10 NOTE — Telephone Encounter (Signed)
Pt called in regards to her Prolia shot. She is not sure when she is due for one again but knows that it is soon. Please advise, thank you!  Call pt @ [phone]

## 2016-04-15 NOTE — Telephone Encounter (Signed)
Pt called back to follow up on the Prolia. Please advise?  Call pt @ [phone].  Thank you!

## 2016-04-19 ENCOUNTER — Ambulatory Visit (INDEPENDENT_AMBULATORY_CARE_PROVIDER_SITE_OTHER): Payer: PPO | Admitting: Family Medicine

## 2016-04-19 ENCOUNTER — Encounter: Payer: Self-pay | Admitting: Family Medicine

## 2016-04-19 DIAGNOSIS — S6990XA Unspecified injury of unspecified wrist, hand and finger(s), initial encounter: Secondary | ICD-10-CM | POA: Insufficient documentation

## 2016-04-19 DIAGNOSIS — S6992XA Unspecified injury of left wrist, hand and finger(s), initial encounter: Secondary | ICD-10-CM

## 2016-04-19 MED ORDER — MUPIROCIN 2 % EX OINT: 1.0000 "application " | TOPICAL_OINTMENT | Freq: Two times a day (BID) | CUTANEOUS | 0 refills | Status: DC

## 2016-04-19 NOTE — Progress Notes (Signed)
Subjective:  Patient ID: [name], female    DOB: 12-28-1940  Age: 76 y.o. MRN: [id number]  CC: ? Finger infection  HPI:  76 year old female presents with the above complaint.  Patient reports a two-month history of an area of concern on her left ring finger. Patient states that she was at the nail salon approximately 2 months ago and had an injury to her finger while getting her nails done. She states that since that time the area has been red and slightly tender. No fluctuance. No drainage. She's been using topical antibiotic ointment without improving. Yesterday she used a needle to attempt to drain the area. It did not drain. No associated fevers or chills. No other associated symptoms. No other complaints at this time.  Social Hx   Social History   Social History  . Marital status: Married    Spouse name: Alaysiah Browder  . Number of children: 0  . 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 Smoking status: Former Hernandez    Packs/day: 1.00    Years: 10.00  . Smokeless tobacco: Never Used  . Alcohol use No  . Drug use: No  . Sexual activity: Yes    Birth control/ protection: Post-menopausal   Other Topics Concern  . None   Social History Narrative   Pt is 76 yo female, married to husband of 21 years.  Pt has no children.  Pt lives with husband and teacup poodle, [name]. Pt states she hurt her back last year and has not been able to participate in much physical activity since. Pt enjoys traveling with her husband in motor home, doing crossword puzzles with husband, and playing cards with friends.  Pt was raised in [facility] and has lived in [place] entire [place] life.  Pt worked in [employer] in [place] until [place] when they closed.      Review of Systems  Constitutional: Negative.   Skin:       Redness, L ring finger.   Objective:  BP 129/68 (BP Location: Left Arm, Patient Position:  Sitting, Cuff Size: Normal)   Pulse 81   Temp 97.6 F (36.4 C) (Oral)   Wt 106 lb 12.8 oz (48.4 kg)   SpO2 99%   BMI 20.86 kg/m   BP/Weight 04/19/2016 12/13/2015 05/09/6576  Systolic BP 469 629 528  Diastolic BP 68 78 68  Wt. (Lbs) 106.8 102.75 103.75  BMI 20.86 20.07 20.26   Physical Exam  Constitutional: She is oriented to person, place, and time. She appears well-developed. No distress.  Pulmonary/Chest: Effort normal.  Neurological: She is alert and oriented to person, place, and time.  Skin:  Left hand, ring finger - dorsal, DIP with a small area of redness with a small eschar. No fluctuance. No warmth.  Psychiatric: She has a normal mood and affect.  Vitals reviewed.  Lab Results  Component Value Date   WBC 7.4 01/30/2015   HGB 14.0 01/30/2015   HCT 42.2 01/30/2015   PLT 179.0 01/30/2015   GLUCOSE 123 (H) 08/07/2015   CHOL 136 10/26/2014   TRIG 100 10/26/2014   HDL 38 (L) 10/26/2014   LDLCALC 78 10/26/2014   ALT 29 08/04/2015   AST 28 08/04/2015   NA 141 08/07/2015   K 5.1 08/07/2015   CL 106 08/07/2015   CREATININE 0.88 08/07/2015   BUN 32 (H) 08/07/2015  CO2 29 08/07/2015   TSH 1.951 10/26/2014   INR 1.04 10/26/2014   HGBA1C 5.4 10/26/2014   Assessment & Plan:   Problem List Items Addressed This Visit    Finger injury    New problem. Does not appear infected. Suspect posttraumatic. Covering with topical mupirocin to be conservative. Watchful waiting.         Meds ordered this encounter  Medications  . mupirocin ointment (BACTROBAN) 2 %    Sig: Apply 1 application topically 2 (two) times daily. For 7 days.    Dispense:  30 g    Refill:  0    Follow-up: PRN  [place]

## 2016-04-19 NOTE — Telephone Encounter (Signed)
Patient was advised waiting on authorization.

## 2016-04-19 NOTE — Patient Instructions (Signed)
Keep an eye on it.  Use the topical agent twice daily.  Take care  Dr. Lacinda Axon

## 2016-04-19 NOTE — Assessment & Plan Note (Signed)
New problem. Does not appear infected. Suspect posttraumatic. Covering with topical mupirocin to be conservative. Watchful waiting.

## 2016-04-20 ENCOUNTER — Other Ambulatory Visit: Payer: Self-pay | Admitting: Cardiovascular Disease

## 2016-04-20 DIAGNOSIS — I5022 Chronic systolic (congestive) heart failure: Secondary | ICD-10-CM

## 2016-04-22 ENCOUNTER — Telehealth: Payer: Self-pay | Admitting: Cardiovascular Disease

## 2016-04-22 NOTE — Telephone Encounter (Signed)
PA for [facility] sent through [email or web address].

## 2016-04-23 NOTE — Telephone Encounter (Signed)
Patient has been scheduled and Prolia in refridgerator. [employer] does not receive request from this office isend request through [place]. I have the authrization which was faxed to me on 04/22/16 and patient is not due til one  Day after 04/26/16. Scheduled for 04/30/16 earliest available.

## 2016-04-23 NOTE — Telephone Encounter (Signed)
Pt called and stated that she just got off the phone with her insurance company in regards to the prolia injection and they stated that they have not received anything. Pt would like you to call her.  Call pt @ [phone]

## 2016-04-23 NOTE — Telephone Encounter (Signed)
Spoke with pt to inform her she was approved for Entresto 24 mg-26 mg Tablet. Approval: 04/22/16-03/03/17 per [employer]	[place]. Pt is appreciative of call.

## 2016-04-30 ENCOUNTER — Ambulatory Visit (INDEPENDENT_AMBULATORY_CARE_PROVIDER_SITE_OTHER): Payer: PPO

## 2016-04-30 DIAGNOSIS — M810 Age-related osteoporosis without current pathological fracture: Secondary | ICD-10-CM | POA: Diagnosis not present

## 2016-04-30 MED ORDER — DENOSUMAB 60 MG/ML ~~LOC~~ SOLN
60.0000 mg | Freq: Once | SUBCUTANEOUS | Status: AC
  Administered 2016-04-30: 60 mg via SUBCUTANEOUS

## 2016-04-30 NOTE — Progress Notes (Signed)
Care was provided under my supervision. I agree with the management as indicated in the note.  Yonael Tulloch DO  

## 2016-04-30 NOTE — Progress Notes (Signed)
Patient came in for Prolia injection.  Received in Right arm, . Patient tolerated well. Patient was given card to let her know that her next appointment is due in August 2018.

## 2016-05-14 ENCOUNTER — Telehealth: Payer: Self-pay | Admitting: Family Medicine

## 2016-05-14 NOTE — Telephone Encounter (Signed)
Pt declined to schedule AWV °

## 2016-05-15 ENCOUNTER — Telehealth: Payer: Self-pay | Admitting: Family Medicine

## 2016-05-15 NOTE — Telephone Encounter (Signed)
PA completed and sent to insurance for approval on Zofran.

## 2016-05-19 DIAGNOSIS — I5022 Chronic systolic (congestive) heart failure: Secondary | ICD-10-CM | POA: Insufficient documentation

## 2016-05-19 DIAGNOSIS — I5043 Acute on chronic combined systolic (congestive) and diastolic (congestive) heart failure: Secondary | ICD-10-CM | POA: Insufficient documentation

## 2016-05-19 DIAGNOSIS — I5023 Acute on chronic systolic (congestive) heart failure: Secondary | ICD-10-CM | POA: Insufficient documentation

## 2016-05-19 NOTE — Progress Notes (Signed)
Cardiology Office Note  Date:  05/20/2016   ID:  [name], DOB 02-27-1941, MRN [id number]  PCP:  Coral Spikes, DO   Chief Complaint  Patient presents with  . other    6 month follow up. Meds reviewed by the pt. verbally. "doing well."     HPI:  76 year old female with history of hepatitis C, osteoporosis, arthritis, chronic back pain s/p multiple fusions, and chronic UTI's, ischemic cardiomyopathy with ejection fraction 25% on echocardiogram November 2016, coronary artery disease with PCI to the mid RCA, proximal LAD August 2016, who presents for routine follow-up of her cardiomyopathy Significant medication intolerances Previously not interested in defibrillator or repeat echocardiogram  In follow-up today she reports that the Lasix continues to cause her to have a runny nose She is taking one half pill every third day and still has runny nose every day Even tried one quarter pill and still had runny nose symptoms She has tried various antihistamines but felt she was overdosing on these antihistamine medications. She has not seen in the throat for her problem She's not tried to cut back on any of her other medications to see if this could be contributing to her problem. She was under the impression that Lasix makes everybody's nose run, As "that is how they lose water"  No recent blood work available  hard to get venous access By history  On her last clinic visit she was taking lasix every 5th day, taking  entresto one half pill once a day  Tolerating metoprolol daily  Weight is up several pounds, tried to stop her Lasix pill but reported having ankle swelling No regular exercise program Chronic back pain limiting her ability to exercise Husband does most of the housework and ADLs, takes care of her She denies any shortness of breath on exertion  EKG on today's visit shows normal sinus rhythm, left bundle branch block, rate 81 bpm  Other past medical history  She  previously felt  Plavix was  causing her nose to run.  Also Previously she felt Lasix was causing her runny nose. now taking Lasix, not taking torsemide  She was unable to tolerate brilinta She was having side effects from the entresto, only able to tolerate 1/2 dose of the 24/26 mg pill once a day in the evening. Able to tolerate metoprolol, Lipitor, potassium  She is not interested in defibrillator. We've had previous discussions She did consider applying external defibrillator to put in their house  Other past medical history reviewed [facility] 10/25/14 to 8/26 for new onset acute systolic CHF, ischemic cardiomyopathy, acute respiratory distress with hypoxia, and newly diagnosed CAD s/p PCI/DES to mid RCA and prox LAD.    Admitted to [facility] in August with acute respiraotry destress with hypoxia   Echo August 2016 showed EF of 20%, akinesis of the inferior, inferoseptal, distal anterior, and apical walls. There was hypokinesis elsewhere. Cavity size was mildly dilated. Wall thickness was normal. Mild aortic regurgitation. Mild to moderate mitral regurgitation. Left atrium was mildly dilated. Right ventricle systolic was was moderate to severely reduced. A small pericardial effusion was seen.   cardiac cath on 8/25 that showed ostial LAD 30%, prox LAD 90%, ostial RCA 30%, mid RCA 90%, ostial Ramus 50%.  She underwent successful PCI/DES to the proximal LAD and mid RCA with Xience DES. She was started on aspirin and Brilinta.   she could not tolerated Brilinta 2/2 SOB. She was changed to Plavix.  PMH:   has a past medical history of Arthritis; CAD (coronary artery disease); Chronic back pain; Chronic fatigue; Chronic systolic CHF (congestive heart failure) ([place]); Hepatitis C; Ischemic cardiomyopathy; Osteoporosis; and UTI (lower urinary tract infection).  PSH:    Past Surgical History:  Procedure Laterality Date  . BACK SURGERY  07/14   T10, L1  . CARDIAC CATHETERIZATION Bilateral  10/27/2014   Procedure: Left Heart Cath and Coronary Angiography;  Surgeon: Minna Merritts, MD;  Location: [facility];  Service: Cardiovascular;  Laterality: Bilateral;  . CARDIAC CATHETERIZATION N/A 10/27/2014   Procedure: Coronary Stent Intervention;  Surgeon: Wellington Hampshire, MD;  Location: [facility];  Service: Cardiovascular;  Laterality: N/A;    Current Outpatient Prescriptions  Medication Sig Dispense Refill  . aspirin EC 81 MG tablet Take 81 mg by mouth daily.    Marland Kitchen atorvastatin (LIPITOR) 80 MG tablet TAKE 1 TABLET (80 MG TOTAL) BY MOUTH DAILY AT 6 PM. 90 tablet 3  . Calcium-Vitamin A-Vitamin D (LIQUID CALCIUM PO) Take by mouth. 1 tablespoon daily    . cholecalciferol (VITAMIN D) 1000 UNITS tablet Take 1,000 Units by mouth daily.    . clopidogrel (PLAVIX) 75 MG tablet Take 1 tablet (75 mg total) by mouth daily. 90 tablet 3  . denosumab (PROLIA) 60 MG/ML SOLN injection Inject 60 mg into the skin every 6 (six) months. 1 Syringe 0  . ENTRESTO 24-26 MG TAKE 1 TABLET BY MOUTH 2 (TWO) TIMES DAILY. 60 tablet 3  . furosemide (LASIX) 20 MG tablet Take 1 tablet (20 mg total) by mouth every other day. (Patient taking differently: Take 10 mg by mouth every other day. ) 45 tablet 3  . metoprolol succinate (TOPROL-XL) 25 MG 24 hr tablet Take 1 tablet (25 mg total) by mouth at bedtime. 90 tablet 3  . ondansetron (ZOFRAN-ODT) 4 MG disintegrating tablet TAKE 1 TABLET BY MOUTH TWICE A DAY AS NEEDED FOR NAUSEA AND VOMITING 60 tablet 3  . potassium chloride (K-DUR) 10 MEQ tablet Take 1 tablet (10 mEq total) by mouth every other day. 45 tablet 3   No current facility-administered medications for this visit.      Allergies:   Sugar-protein-starch   Social History:  The patient  reports that she has quit smoking. She has a 10.00 pack-year smoking history. She has never used smokeless tobacco. She reports that she does not drink alcohol or use drugs.   Family History:   family history  includes Arthritis in her mother; Diabetes in her father; Heart disease in her father.    Review of Systems: Review of Systems  Constitutional: Negative.        Runny nose  Respiratory: Negative.   Cardiovascular: Negative.   Gastrointestinal: Negative.   Musculoskeletal: Negative.   Neurological: Negative.   Psychiatric/Behavioral: Negative.   All other systems reviewed and are negative.    PHYSICAL EXAM: VS:  BP 120/60 (BP Location: Left Arm, Patient Position: Sitting, Cuff Size: Normal)   Pulse 81   Ht 5' (1.524 m)   Wt 106 lb 8 oz (48.3 kg)   BMI 20.80 kg/m  , BMI Body mass index is 20.8 kg/m. GEN: Thin,  in no acute distress  HEENT: normal  Neck: no JVD, carotid bruits, or masses Cardiac: RRR; no murmurs, rubs, or gallops,no edema  Respiratory:  clear to auscultation bilaterally, normal work of breathing GI: soft, nontender, nondistended, + BS MS: no deformity or atrophy  Skin: warm and dry,  no rash Neuro:  Strength and sensation are intact Psych: euthymic mood, full affect   Recent Labs: 08/04/2015: ALT 29 08/07/2015: BUN 32; Creatinine, Ser 0.88; Potassium 5.1; Sodium 141    Lipid Panel Lab Results  Component Value Date   CHOL 136 10/26/2014   HDL 38 (L) 10/26/2014   LDLCALC 78 10/26/2014   TRIG 100 10/26/2014      Wt Readings from Last 3 Encounters:  05/20/16 106 lb 8 oz (48.3 kg)  04/19/16 106 lb 12.8 oz (48.4 kg)  12/13/15 102 lb 12 oz (46.6 kg)       ASSESSMENT AND PLAN:  Essential hypertension - Plan: EKG 12-Lead, CANCELED: Basic Metabolic Panel (BMET) Blood pressure is well controlled on today's visit. No changes made to the medications.  Chronic systolic CHF (congestive heart failure) ([place]) - Plan: EKG 12-Lead, CANCELED: Basic Metabolic Panel (BMET) Long discussion concerning her Lasix. She continues to feel is causes her runny nose despite taking minimal dose every third day. Suggested she hold the Lasix for a week or 2 and see if her nose  symptoms improve If it does, we Would try alternate diuretic Such as HCTZ She is taking minimal entresto. Previously was taking one half pill once a day Significant medication intolerances  Coronary artery disease of native artery of native heart with stable angina pectoris ([place]) - Plan: EKG 12-Lead, CANCELED: Basic Metabolic Panel (BMET) Currently with no symptoms of angina. No further workup at this time. Continue current medication regimen.  Mixed hyperlipidemia - Plan: EKG 12-Lead, CANCELED: Basic Metabolic Panel (BMET) No recent lab work available Tried to obtain lab work today but very difficult access  History of coronary artery stent placement - Plan: EKG 12-Lead, CANCELED: Basic Metabolic Panel (BMET) Denies any symptoms concerning for angina No further testing at this time  Runny nose She feels her symptoms are from Lasix Previous office visit she felt it was from Plavix Recommended she hold Lasix for one week and see if symptoms resolve She is only taking 10 mg every third day but has runny nose symptoms daily Recommended she see her nose throat  Disposition:   F/U  6 months   Total encounter time more than 25 minutes  Greater than 50% was spent in counseling and coordination of care with the patient    Orders Placed This Encounter  Procedures  . EKG 12-Lead     Signed, Esmond Plants, M.D., Ph.D. 05/20/2016  [place], [place]

## 2016-05-20 ENCOUNTER — Ambulatory Visit (INDEPENDENT_AMBULATORY_CARE_PROVIDER_SITE_OTHER): Payer: PPO | Admitting: Cardiovascular Disease

## 2016-05-20 ENCOUNTER — Encounter: Payer: Self-pay | Admitting: Cardiovascular Disease

## 2016-05-20 VITALS — BP 120/60 | HR 81 | Ht 60.0 in | Wt 106.5 lb

## 2016-05-20 DIAGNOSIS — R0989 Other specified symptoms and signs involving the circulatory and respiratory systems: Secondary | ICD-10-CM | POA: Diagnosis not present

## 2016-05-20 DIAGNOSIS — E782 Mixed hyperlipidemia: Secondary | ICD-10-CM

## 2016-05-20 DIAGNOSIS — I10 Essential (primary) hypertension: Secondary | ICD-10-CM

## 2016-05-20 DIAGNOSIS — Z955 Presence of coronary angioplasty implant and graft: Secondary | ICD-10-CM | POA: Diagnosis not present

## 2016-05-20 DIAGNOSIS — I5022 Chronic systolic (congestive) heart failure: Secondary | ICD-10-CM | POA: Diagnosis not present

## 2016-05-20 DIAGNOSIS — I25118 Atherosclerotic heart disease of native coronary artery with other forms of angina pectoris: Secondary | ICD-10-CM

## 2016-05-20 NOTE — Patient Instructions (Addendum)
Medication Instructions:  Try to do a hold on each pill slowly (one at a time) to figure out which is causing the nose running    Labwork:  BMP today  Testing/Procedures:  No further testing at this time   I recommend watching educational videos on topics of interest to you at:       www.goemmi.com  Enter code: [email or web address]    Follow-Up: It was a pleasure seeing you in the office today. Please call us if you have new issues that need to be addressed before your next appt.  [phone]  Your physician wants you to follow-up in: 6 months.  You will receive a reminder letter in the mail two months in advance. If you don't receive a letter, please call our office to schedule the follow-up appointment.  If you need a refill on your cardiac medications before your next appointment, please call your pharmacy.

## 2016-06-03 ENCOUNTER — Telehealth: Payer: Self-pay | Admitting: Cardiovascular Disease

## 2016-06-03 NOTE — Telephone Encounter (Signed)
Ok to hold asa and magnesium Would stay on plavix

## 2016-06-03 NOTE — Telephone Encounter (Signed)
Patient calling to let Dr. Rockey Situ know that she thinks the asa and magnesium is causing nose to run .  Patient has stopped taking both and sx have improved.  Please call to discuss med change .

## 2016-06-03 NOTE — Telephone Encounter (Signed)
Pt previously thought that lasix was making her nose run, but after last ov on 3/19, she was advised to hold individual meds and see if sx improved.  After experimenting w/ her meds, she feels that aspirin and magnesium are causing her runny nose and she would like to know if she can hold or reduce these meds. She would like Dr. Donivan Scull recommendation before she makes this change herself.

## 2016-06-04 NOTE — Telephone Encounter (Signed)
Spoke w/ pt. Advised her of Dr. Donivan Scull recommendation.   She verbalizes understanding and will call back w/ any other questions or concerns.

## 2016-06-24 DIAGNOSIS — H5203 Hypermetropia, bilateral: Secondary | ICD-10-CM | POA: Diagnosis not present

## 2016-06-24 DIAGNOSIS — H524 Presbyopia: Secondary | ICD-10-CM | POA: Diagnosis not present

## 2016-06-24 DIAGNOSIS — H52223 Regular astigmatism, bilateral: Secondary | ICD-10-CM | POA: Diagnosis not present

## 2016-06-24 DIAGNOSIS — I10 Essential (primary) hypertension: Secondary | ICD-10-CM | POA: Diagnosis not present

## 2016-06-24 DIAGNOSIS — H2513 Age-related nuclear cataract, bilateral: Secondary | ICD-10-CM | POA: Diagnosis not present

## 2016-09-10 IMAGING — CR DG ABDOMEN 3V
1 series · 3 of 3 positions shown · non-contrast
Comparison: April 03, 2013

CLINICAL DATA: Nausea and vomiting for several hr

EXAM:
ABDOMEN SERIES

[Series 1: dxr abdomen 3-way (incl pa cxr) · 0.14mm/px · 3 of 3 slices shown]
[im 1/3]
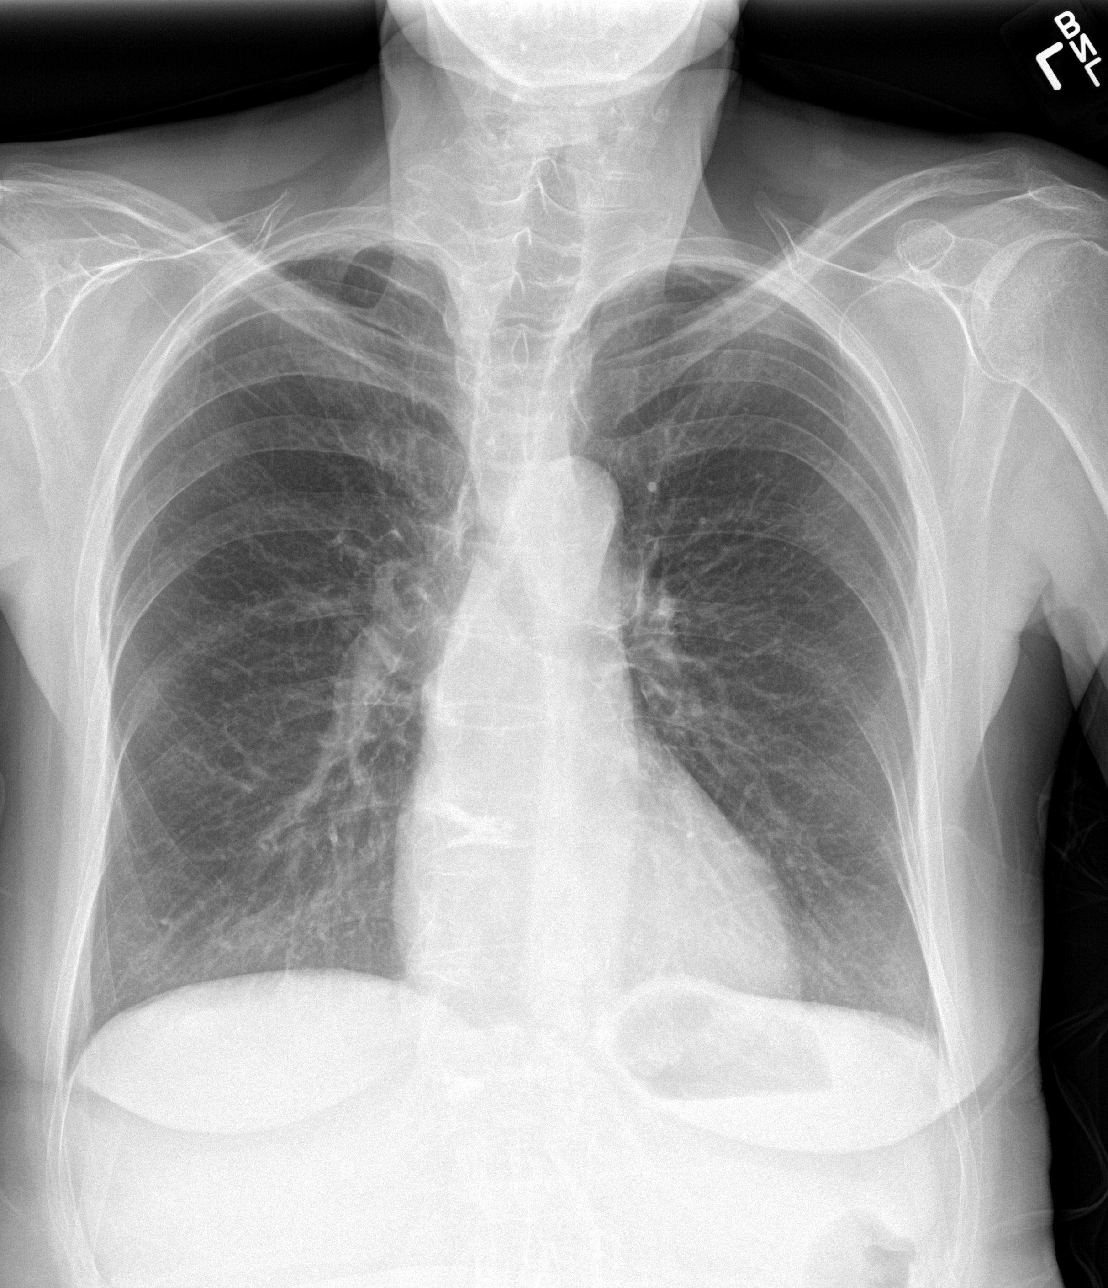
[im 2/3]
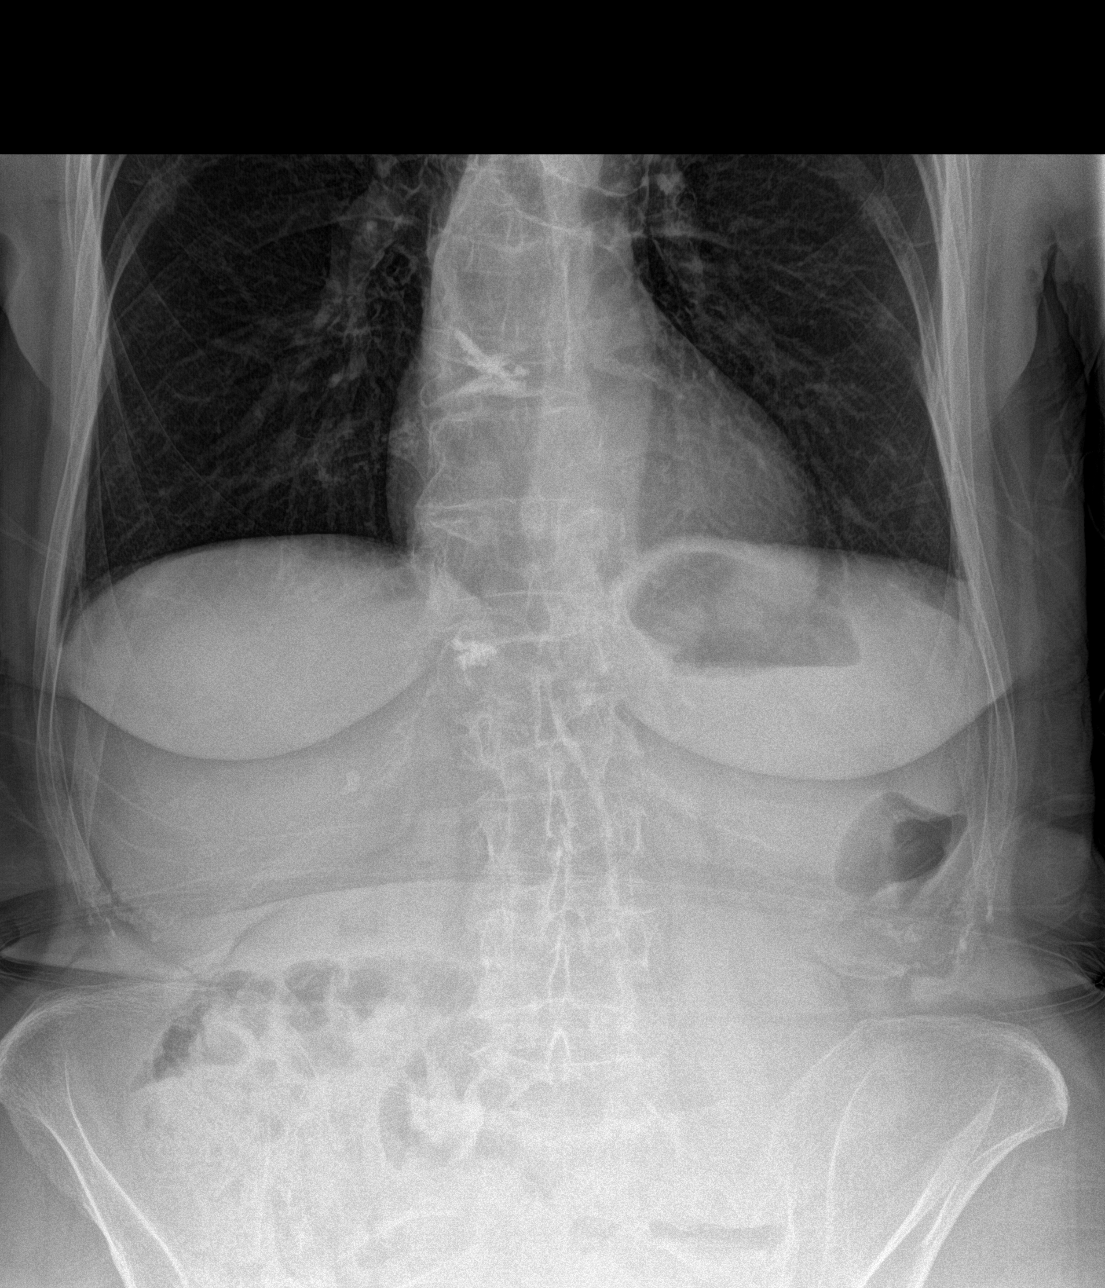
[im 3/3]
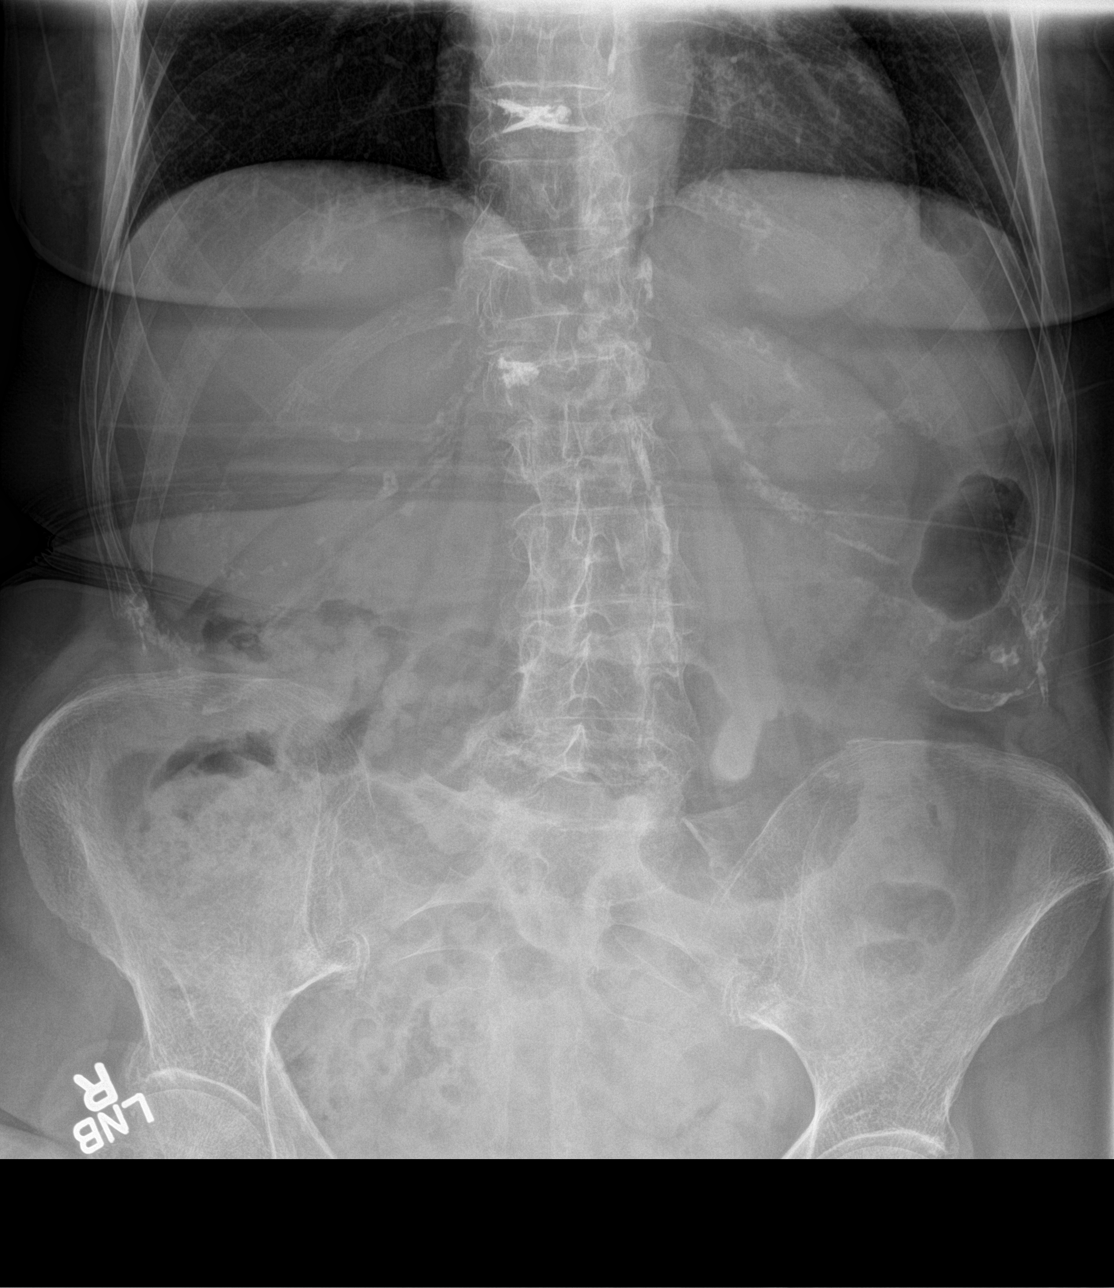

[3 of 3 positions shown; findings below may reference images not displayed]

FINDINGS: PA chest: There is no edema or consolidation. Heart size and
pulmonary vascularity are normal. No adenopathy. Patient has
undergone kyphoplasty procedures at T10 and L1.

Supine and upright abdomen: There is moderate stool throughout the
colon. Bowel gas pattern is unremarkable. No obstruction or free
air. No abnormal calcifications. Bones are osteoporotic with
multiple compression fractures. There is atherosclerotic change in
the aorta.
IMPRESSION: Bowel gas pattern unremarkable. Multiple compression fractures,
likely due to osteoporosis. No lung edema or consolidation.

## 2016-10-06 ENCOUNTER — Other Ambulatory Visit: Payer: Self-pay | Admitting: Cardiovascular Disease

## 2016-10-07 ENCOUNTER — Other Ambulatory Visit: Payer: Self-pay | Admitting: Cardiovascular Disease

## 2016-10-29 ENCOUNTER — Ambulatory Visit (INDEPENDENT_AMBULATORY_CARE_PROVIDER_SITE_OTHER): Payer: PPO

## 2016-10-29 ENCOUNTER — Other Ambulatory Visit (INDEPENDENT_AMBULATORY_CARE_PROVIDER_SITE_OTHER): Payer: PPO

## 2016-10-29 DIAGNOSIS — I10 Essential (primary) hypertension: Secondary | ICD-10-CM | POA: Diagnosis not present

## 2016-10-29 DIAGNOSIS — I2510 Atherosclerotic heart disease of native coronary artery without angina pectoris: Secondary | ICD-10-CM | POA: Diagnosis not present

## 2016-10-29 DIAGNOSIS — E785 Hyperlipidemia, unspecified: Secondary | ICD-10-CM | POA: Diagnosis not present

## 2016-10-29 DIAGNOSIS — M810 Age-related osteoporosis without current pathological fracture: Secondary | ICD-10-CM

## 2016-10-29 DIAGNOSIS — Z8619 Personal history of other infectious and parasitic diseases: Secondary | ICD-10-CM | POA: Diagnosis not present

## 2016-10-29 LAB — COMPREHENSIVE METABOLIC PANEL
ALBUMIN: 4.2 g/dL (ref 3.5–5.2)
ALT: 24 U/L (ref 0–35)
AST: 29 U/L (ref 0–37)
Alkaline Phosphatase: 52 U/L (ref 39–117)
BILIRUBIN TOTAL: 0.5 mg/dL (ref 0.2–1.2)
BUN: 23 mg/dL (ref 6–23)
CALCIUM: 9.7 mg/dL (ref 8.4–10.5)
CHLORIDE: 102 meq/L (ref 96–112)
CO2: 33 mEq/L — ABNORMAL HIGH (ref 19–32)
CREATININE: 0.99 mg/dL (ref 0.40–1.20)
GFR: 58.1 mL/min — ABNORMAL LOW (ref >60.00)
Glucose, Bld: 94 mg/dL (ref 70–99)
Potassium: 4.7 mEq/L (ref 3.5–5.1)
SODIUM: 140 meq/L (ref 135–145)
Total Protein: 7 g/dL (ref 6.0–8.3)

## 2016-10-29 LAB — CBC
HEMATOCRIT: 41.6 % (ref 36.0–46.0)
Hemoglobin: 13.8 g/dL (ref 12.0–15.0)
MCHC: 33.2 g/dL (ref 30.0–36.0)
MCV: 96.9 fl (ref 78.0–100.0)
Platelets: 199 10*3/uL (ref 150.0–400.0)
RBC: 4.29 Mil/uL (ref 3.87–5.11)
RDW: 13.9 % (ref 11.5–15.5)
WBC: 7 10*3/uL (ref 4.0–10.5)

## 2016-10-29 LAB — LIPID PANEL
CHOLESTEROL: 128 mg/dL (ref 0–200)
HDL: 50.4 mg/dL (ref >39.00)
LDL Cholesterol: 49 mg/dL (ref 0–99)
NonHDL: 77.45
TRIGLYCERIDES: 144 mg/dL (ref 0.0–149.0)
Total CHOL/HDL Ratio: 3
VLDL: 28.8 mg/dL (ref 0.0–40.0)

## 2016-10-29 MED ORDER — DENOSUMAB 60 MG/ML ~~LOC~~ SOLN
60.0000 mg | Freq: Once | SUBCUTANEOUS | Status: AC
  Administered 2016-10-29: 60 mg via SUBCUTANEOUS

## 2016-10-29 NOTE — Progress Notes (Signed)
Patient comes in for Prolia injection.  Injected left arm subcutaneously.  Patient tolerated injection well. She was instructed to come back in six months She was given reminder card  when next injection should be scheduled.   Agree with plan. Will advise patient to make f/u with me. Mable Paris, NP

## 2016-10-30 ENCOUNTER — Telehealth: Payer: Self-pay | Admitting: Family

## 2016-10-30 LAB — HEPATITIS C ANTIBODY: HCV Ab: NONREACTIVE

## 2016-10-30 NOTE — Telephone Encounter (Signed)
Advise pt to make f/u appt with me  She had prolia injection recently and want to ensure I understand all her medications since jayce is leaving

## 2016-10-30 NOTE — Telephone Encounter (Signed)
Patient is scheduling appt for F/U.

## 2016-10-31 LAB — HEPATITIS C RNA QUANTITATIVE
HCV QUANT LOG: NOT DETECTED {Log_IU}/mL
HCV QUANT: NOT DETECTED [IU]/mL

## 2016-11-01 ENCOUNTER — Telehealth: Payer: Self-pay | Admitting: Radiology

## 2016-11-01 DIAGNOSIS — I10 Essential (primary) hypertension: Secondary | ICD-10-CM

## 2016-11-01 NOTE — Telephone Encounter (Signed)
Pt coming in for labs Next Thursday, please place future orders. Thank you.

## 2016-11-05 NOTE — Telephone Encounter (Signed)
Ordered. Let pt know to call and schedule

## 2016-11-05 NOTE — Addendum Note (Signed)
Addended by: Burnard Hawthorne on: 11/05/2016 08:42 AM   Modules accepted: Orders

## 2016-11-07 ENCOUNTER — Other Ambulatory Visit: Payer: PPO

## 2016-11-12 ENCOUNTER — Ambulatory Visit: Payer: PPO | Admitting: Family

## 2016-11-17 NOTE — Progress Notes (Signed)
Cardiology Office Note  Date:  11/18/2016   ID:  MAYLANI EMBREE, DOB 10-Apr-1940, MRN [id number]  PCP:  Burnard Hawthorne, FNP   Chief Complaint  Patient presents with  . other    6 month follow up. Patient states she is doing well. Meds reviewed verbally with patient.     HPI:   76 year old female with history of  hepatitis C,  osteoporosis,  arthritis,  chronic back pain s/p multiple fusions,  chronic UTI's,  ischemic cardiomyopathy with ejection fraction 25% on echocardiogram November 2016, coronary artery disease with PCI to the mid RCA,  proximal LAD August 2016,  Significant medication intolerances Previously not interested in defibrillator or repeat echocardiogram who presents for routine follow-up of her cardiomyopathy  In general she is feeling well with no complaints Weight is up 5 pounds since her last clinic visit 6 months ago Was told her kidneys were not working well and decreased her Lasix Previously was taking this every other day, now every 2 days Review of lab work with her shows stable renal function She is taking plavix daily Aspirin every other day She is taking metoprolol daily, entresto once a day rather than twice a day  Lasix continues to cause her to have a runny nose She was under the impression that Lasix makes everybody's nose run, As "that is how they lose water"  She is doing 10-15 minutes or treadmill daily Chronic back pain limiting her ability to exercise Husband does most of the housework and ADLs, takes care of her She denies any shortness of breath on exertion  EKG personally reviewed by myself on today's visit shows normal sinus rhythm, left bundle branch block, rate 82 bpm  Other past medical history  She previously felt  Plavix was  causing her nose to run.  Also Previously she felt Lasix was causing her runny nose. now taking Lasix, not taking torsemide  She was unable to tolerate brilinta She was having side effects from the  entresto, only able to tolerate 1/2 dose of the 24/26 mg pill once a day in the evening. Able to tolerate metoprolol, Lipitor, potassium  She is not interested in defibrillator. We've had previous discussions She did consider applying external defibrillator to put in their house  Other past medical history reviewed [facility] 10/25/14 to 8/26 for new onset acute systolic CHF, ischemic cardiomyopathy, acute respiratory distress with hypoxia, and newly diagnosed CAD s/p PCI/DES to mid RCA and prox LAD.    Admitted to [facility] in August with acute respiraotry destress with hypoxia   Echo August 2016 showed EF of 20%, akinesis of the inferior, inferoseptal, distal anterior, and apical walls. There was hypokinesis elsewhere. Cavity size was mildly dilated. Wall thickness was normal. Mild aortic regurgitation. Mild to moderate mitral regurgitation. Left atrium was mildly dilated. Right ventricle systolic was was moderate to severely reduced. A small pericardial effusion was seen.   cardiac cath on 8/25 that showed ostial LAD 30%, prox LAD 90%, ostial RCA 30%, mid RCA 90%, ostial Ramus 50%.  She underwent successful PCI/DES to the proximal LAD and mid RCA with Xience DES. She was started on aspirin and Brilinta.   she could not tolerated Brilinta 2/2 SOB. She was changed to Plavix.    PMH:   has a past medical history of Arthritis; CAD (coronary artery disease); Chronic back pain; Chronic fatigue; Chronic systolic CHF (congestive heart failure) ([place]); Hepatitis C; Ischemic cardiomyopathy; Osteoporosis; and UTI (lower urinary tract infection).  PSH:    Past Surgical History:  Procedure Laterality Date  . BACK SURGERY  07/14   T10, L1  . CARDIAC CATHETERIZATION Bilateral 10/27/2014   Procedure: Left Heart Cath and Coronary Angiography;  Surgeon: Minna Merritts, MD;  Location: [facility];  Service: Cardiovascular;  Laterality: Bilateral;  . CARDIAC CATHETERIZATION N/A 10/27/2014    Procedure: Coronary Stent Intervention;  Surgeon: Wellington Hampshire, MD;  Location: [facility];  Service: Cardiovascular;  Laterality: N/A;    Current Outpatient Prescriptions  Medication Sig Dispense Refill  . aspirin EC 81 MG tablet Take 81 mg by mouth daily.    Marland Kitchen atorvastatin (LIPITOR) 80 MG tablet TAKE 1 TABLET (80 MG TOTAL) BY MOUTH DAILY AT 6 PM. 90 tablet 3  . Calcium-Vitamin A-Vitamin D (LIQUID CALCIUM PO) Take by mouth. 1 tablespoon daily    . cholecalciferol (VITAMIN D) 1000 UNITS tablet Take 1,000 Units by mouth daily.    . clopidogrel (PLAVIX) 75 MG tablet Take 1 tablet (75 mg total) by mouth daily. 90 tablet 3  . denosumab (PROLIA) 60 MG/ML SOLN injection Inject 60 mg into the skin every 6 (six) months. 1 Syringe 0  . ENTRESTO 24-26 MG TAKE 1 TABLET BY MOUTH 2 (TWO) TIMES DAILY. 60 tablet 3  . furosemide (LASIX) 20 MG tablet Take 1 tablet (20 mg total) by mouth every other day. (Patient taking differently: Take 10 mg by mouth every other day. ) 45 tablet 3  . metoprolol succinate (TOPROL-XL) 25 MG 24 hr tablet Take 1 tablet (25 mg total) by mouth at bedtime. 90 tablet 3  . ondansetron (ZOFRAN-ODT) 4 MG disintegrating tablet TAKE 1 TABLET BY MOUTH TWICE A DAY AS NEEDED FOR NAUSEA AND VOMITING 60 tablet 3  . potassium chloride (K-DUR) 10 MEQ tablet Take 1 tablet (10 mEq total) by mouth every other day. 45 tablet 3   No current facility-administered medications for this visit.      Allergies:   Sugar-protein-starch   Social History:  The patient  reports that she has quit smoking. She has a 10.00 pack-year smoking history. She has never used smokeless tobacco. She reports that she does not drink alcohol or use drugs.   Family History:   family history includes Arthritis in her mother; Diabetes in her father; Heart disease in her father.    Review of Systems: Review of Systems  Constitutional: Negative.        Runny nose, weight gain   Respiratory: Negative.    Cardiovascular: Negative.   Gastrointestinal: Negative.   Musculoskeletal: Positive for back pain.  Neurological: Negative.   Psychiatric/Behavioral: Negative.   All other systems reviewed and are negative.    PHYSICAL EXAM: VS:  BP 135/73 (BP Location: Left Arm, Patient Position: Sitting, Cuff Size: Normal)   Pulse 82   Ht 5' (1.524 m)   Wt 111 lb 8 oz (50.6 kg)   BMI 21.78 kg/m  , BMI Body mass index is 21.78 kg/m.  GEN: Thin,  in no acute distress  HEENT: normal  Neck: no JVD, carotid bruits, or masses Cardiac: RRR; no murmurs, rubs, or gallops,no edema  Respiratory:  clear to auscultation bilaterally, normal work of breathing GI: soft, nontender, nondistended, + BS MS: no deformity or atrophy  Skin: warm and dry, no rash Neuro:  Strength and sensation are intact Psych: euthymic mood, full affect   Recent Labs: 10/29/2016: ALT 24; BUN 23; Creatinine, Ser 0.99; Hemoglobin 13.8; Platelets 199.0; Potassium 4.7;  Sodium 140    Lipid Panel Lab Results  Component Value Date   CHOL 128 10/29/2016   HDL 50.40 10/29/2016   LDLCALC 49 10/29/2016   TRIG 144.0 10/29/2016      Wt Readings from Last 3 Encounters:  11/18/16 111 lb 8 oz (50.6 kg)  05/20/16 106 lb 8 oz (48.3 kg)  04/19/16 106 lb 12.8 oz (48.4 kg)       ASSESSMENT AND PLAN:  Essential hypertension - Plan: EKG 12-Lead, CANCELED: Basic Metabolic Panel (BMET) Blood pressure is well controlled on today's visit. No changes made to the medications. She does not want higher doses of entresto. Taking this once a day  Chronic systolic CHF (congestive heart failure) (H[facility]) - No major changes in her medications Recommended she go back on Lasix every other day given 5 pound weight gain Renal function stable. Does not need recheck at this time Potassium stable  Coronary artery disease of native artery of native heart with stable angina pectoris ([place]) -  Currently with no symptoms of angina. No further workup at  this time. Continue current medication regimen. for now stay on aspirin and Plavix. Discussed risk and benefit of blood thinners with her.  Mixed hyperlipidemia - Plan: EKG 12-Lead, CANCELED: Basic Metabolic Panel (BMET) No recent lab work available  History of coronary artery stent placement - Plan: EKG 12-Lead, CANCELED: Basic Metabolic Panel (BMET) Denies any symptoms concerning for angina No further testing at this time. Everything is stable   Disposition:   F/U  6 months   Total encounter time more than 25 minutes  Greater than 50% was spent in counseling and coordination of care with the patient    Orders Placed This Encounter  Procedures  . EKG 12-Lead     Signed, Esmond Plants, M.D., Ph.D. 11/18/2016  [place], [place]

## 2016-11-18 ENCOUNTER — Ambulatory Visit (INDEPENDENT_AMBULATORY_CARE_PROVIDER_SITE_OTHER): Payer: PPO | Admitting: Cardiovascular Disease

## 2016-11-18 ENCOUNTER — Encounter: Payer: Self-pay | Admitting: Cardiovascular Disease

## 2016-11-18 ENCOUNTER — Encounter: Payer: Self-pay | Admitting: Family

## 2016-11-18 ENCOUNTER — Ambulatory Visit (INDEPENDENT_AMBULATORY_CARE_PROVIDER_SITE_OTHER): Payer: PPO | Admitting: Family

## 2016-11-18 VITALS — BP 135/73 | HR 82 | Ht 60.0 in | Wt 111.5 lb

## 2016-11-18 DIAGNOSIS — Z955 Presence of coronary angioplasty implant and graft: Secondary | ICD-10-CM | POA: Diagnosis not present

## 2016-11-18 DIAGNOSIS — E782 Mixed hyperlipidemia: Secondary | ICD-10-CM | POA: Diagnosis not present

## 2016-11-18 DIAGNOSIS — M810 Age-related osteoporosis without current pathological fracture: Secondary | ICD-10-CM

## 2016-11-18 DIAGNOSIS — Z8619 Personal history of other infectious and parasitic diseases: Secondary | ICD-10-CM | POA: Diagnosis not present

## 2016-11-18 DIAGNOSIS — I5022 Chronic systolic (congestive) heart failure: Secondary | ICD-10-CM | POA: Diagnosis not present

## 2016-11-18 DIAGNOSIS — I10 Essential (primary) hypertension: Secondary | ICD-10-CM | POA: Diagnosis not present

## 2016-11-18 DIAGNOSIS — I25118 Atherosclerotic heart disease of native coronary artery with other forms of angina pectoris: Secondary | ICD-10-CM

## 2016-11-18 NOTE — Patient Instructions (Addendum)

## 2016-11-18 NOTE — Assessment & Plan Note (Addendum)
On prolia. DEXA scan showed slight worsening of femur since starting prolia. Discussed risks of medication and whether or not to continue to try alternate therapy and consult rheumatology. We will await DEXA scan 2019 and decide next steps.

## 2016-11-18 NOTE — Assessment & Plan Note (Signed)
Undetectable per recent labs.

## 2016-11-18 NOTE — Progress Notes (Signed)
Subjective:    Patient ID: [name], female    DOB: [birth year], 76 y.o.   MRN: [id number]  CC: [name] is a 76 y.o. female who presents today for follow up.   HPI: CHF/HTN- compliant with medication. Denies exertional chest pain or pressure, numbness or tingling radiating to left arm or jaw, palpitations, dizziness, frequent headaches, changes in vision, or shortness of breath.    Osteoporosis- on prolia for 2 years. No recent fractures.   Hepatitis C- treated 20+ years ago.    No concerns today.    Had [place] today; reviewed note. No changes made  No longer doing colonoscopy.  Past Medical History:  Diagnosis Date  . Arthritis   . CAD (coronary artery disease)    a. cardiac cath 10/2014: ostial LAD 30%, prox LAD 90% s/p PCI/DES, ostial RCA 30%, mid RCA 90% s/p PCI/DES, ostial Ramus 50%  . Chronic back pain   . Chronic fatigue   . Chronic systolic CHF (congestive heart failure) ([place])    a. echo 10/2014: EF 20%, AK inf, inf-sep, distal ant, & apical walls, HK elsewhere. Cavity size mildly dilated. Wall thickness nl. mild AI. Mild to mod MR. LA mildly dilated. RV sys fxn mod to severely reduced. small pericardial effusion was ID'd.   . Hepatitis C   . Ischemic cardiomyopathy   . Osteoporosis   . UTI (lower urinary tract infection)    Past Surgical History:  Procedure Laterality Date  . BACK SURGERY  07/14   T10, L1  . CARDIAC CATHETERIZATION Bilateral 10/27/2014   Procedure: Left Heart Cath and Coronary Angiography;  Surgeon: Minna Merritts, MD;  Location: [facility];  Service: Cardiovascular;  Laterality: Bilateral;  . CARDIAC CATHETERIZATION N/A 10/27/2014   Procedure: Coronary Stent Intervention;  Surgeon: Wellington Hampshire, MD;  Location: [facility];  Service: Cardiovascular;  Laterality: N/A;   Family History  Problem Relation Age of Onset  . Arthritis Mother   . Heart disease Father        passed at 45  . Diabetes Father      Allergies: Sugar-protein-starch Current Outpatient Prescriptions on File Prior to Visit  Medication Sig Dispense Refill  . aspirin EC 81 MG tablet Take 81 mg by mouth daily.    Marland Kitchen atorvastatin (LIPITOR) 80 MG tablet TAKE 1 TABLET (80 MG TOTAL) BY MOUTH DAILY AT 6 PM. 90 tablet 3  . Calcium-Vitamin A-Vitamin D (LIQUID CALCIUM PO) Take by mouth. 1 tablespoon daily    . cholecalciferol (VITAMIN D) 1000 UNITS tablet Take 1,000 Units by mouth daily.    . clopidogrel (PLAVIX) 75 MG tablet Take 1 tablet (75 mg total) by mouth daily. 90 tablet 3  . denosumab (PROLIA) 60 MG/ML SOLN injection Inject 60 mg into the skin every 6 (six) months. 1 Syringe 0  . ENTRESTO 24-26 MG TAKE 1 TABLET BY MOUTH 2 (TWO) TIMES DAILY. 60 tablet 3  . furosemide (LASIX) 20 MG tablet Take 1 tablet (20 mg total) by mouth every other day. (Patient taking differently: Take 10 mg by mouth every other day. ) 45 tablet 3  . metoprolol succinate (TOPROL-XL) 25 MG 24 hr tablet Take 1 tablet (25 mg total) by mouth at bedtime. 90 tablet 3  . ondansetron (ZOFRAN-ODT) 4 MG disintegrating tablet TAKE 1 TABLET BY MOUTH TWICE A DAY AS NEEDED FOR NAUSEA AND VOMITING 60 tablet 3  . potassium chloride (K-DUR) 10 MEQ tablet Take 1 tablet (10  mEq total) by mouth every other day. 45 tablet 3   No current facility-administered medications on file prior to visit.     Social History  Substance Use Topics  . Smoking status: Former Hernandez    Packs/day: 1.00    Years: 10.00  . Smokeless tobacco: Never Used  . Alcohol use No    Review of Systems  Constitutional: Negative for chills and fever.  Respiratory: Negative for cough.   Cardiovascular: Negative for chest pain and palpitations.  Gastrointestinal: Negative for nausea and vomiting.      Objective:    BP (!) 116/58   Pulse 75   Temp 98.3 F (36.8 C) (Oral)   Ht 5' (1.524 m)   Wt 112 lb 6.4 oz (51 kg)   SpO2 98%   BMI 21.95 kg/m  BP Readings from Last 3 Encounters:   11/18/16 (!) 116/58  11/18/16 135/73  05/20/16 120/60   Wt Readings from Last 3 Encounters:  11/18/16 112 lb 6.4 oz (51 kg)  11/18/16 111 lb 8 oz (50.6 kg)  05/20/16 106 lb 8 oz (48.3 kg)    Physical Exam  Constitutional: She appears well-developed and well-nourished.  Eyes: Conjunctivae are normal.  Cardiovascular: Normal rate, regular rhythm, normal heart sounds and normal pulses.   Pulmonary/Chest: Effort normal and breath sounds normal. She has no wheezes. She has no rhonchi. She has no rales.  Neurological: She is alert.  Skin: Skin is warm and dry.  Psychiatric: She has a normal mood and affect. Her speech is normal and behavior is normal. Thought content normal.  Vitals reviewed.      Assessment & Plan:   Problem List Items Addressed This Visit      Cardiovascular and Mediastinum   Essential hypertension    Controlled. Continue current regimen.         Digestive   History of hepatitis C    Undetectable per recent labs.         Musculoskeletal and Integument   Osteoporosis    On prolia. DEXA scan showed slight worsening of femur since starting prolia. Discussed risks of medication and whether or not to continue to try alternate therapy and consult rheumatology. We will await DEXA scan 2019 and decide next steps.           I am having [name] maintain her cholecalciferol, Calcium-Vitamin A-Vitamin D (LIQUID CALCIUM PO), denosumab, aspirin EC, ondansetron, clopidogrel, metoprolol succinate, potassium chloride, furosemide, ENTRESTO, and atorvastatin.   No orders of the defined types were placed in this encounter.   Return precautions given.   Risks, benefits, and alternatives of the medications and treatment plan prescribed today were discussed, and patient expressed understanding.   Education regarding symptom management and diagnosis given to patient on AVS.  Continue to follow with Burnard Hawthorne, FNP for routine health maintenance.   [name] and I agreed with plan.   Mable Paris, FNP

## 2016-11-18 NOTE — Assessment & Plan Note (Signed)
Controlled. Continue current regimen. 

## 2016-11-18 NOTE — Patient Instructions (Addendum)
Influenza vaccine and tetanus ( Tdap)  vaccine when you can.   Ensure you have Bone Scan next year, 2019.   Pleasure meeting you.   Follow up 3-6 months.

## 2016-11-25 ENCOUNTER — Other Ambulatory Visit: Payer: Self-pay | Admitting: Cardiovascular Disease

## 2016-11-27 NOTE — Telephone Encounter (Signed)
Error

## 2016-12-03 ENCOUNTER — Other Ambulatory Visit: Payer: Self-pay

## 2016-12-25 ENCOUNTER — Telehealth: Payer: Self-pay | Admitting: Cardiovascular Disease

## 2016-12-25 NOTE — Telephone Encounter (Signed)
Pt calling if there is anything new, or something that may help that she doesn't need to take the lasix anymore  States she's mentioned this before to Dr Rockey Situ but for the last 2 years she's had a runny nose/ states it started only when she started to take her lasix  Would like to know if there is anything else she can take in place of that  Please advise.

## 2016-12-25 NOTE — Telephone Encounter (Signed)
Can we confirm her dosing, Previously was taking daily, then QOD then every 2 days on her last visit That would mean a runny nose on the lasix days 3 days a week? Lasix wears off after 6 hours typically  She is welcome to try torsemide 20 mg QOD

## 2016-12-25 NOTE — Telephone Encounter (Signed)
Pt reports she continues to have a runny nose and thinks it is due to lasix. She asks if there is another diuretic she can try or if she can decrease lasix dose. Advised pt that Dr. Rockey Situ has her on this medication as he feels it is best for her and if it is decreased, pt may retain fluid. Pt has [facility]. Per MD Sept OV notes: " Lasix continues to cause her to have a runny nose She was under the impression that Lasix makes everybody's nose run, As "that is how they lose water" "  States she has discussed this with him for 2 years and would like to consider another medication. Will route to MD

## 2016-12-26 NOTE — Telephone Encounter (Signed)
That is ok thx

## 2016-12-26 NOTE — Telephone Encounter (Signed)
S/w patient. Clarified with patient her diuretic. When suggested to patient to switch to Torsemide patient stated she was already taking it. She said she had that bottle and decided to start taking torsemide 10 mg by mouth every other day instead of the furosemide. She says she's been doing this for a couple weeks. She thought they were the "same medication" and it did not matter which one to take. Advised the medications are each diuretics and do the same thing but are still different medicines and may require different dosing. Patient still just can not stand having a runny nose all the time. She was hoping Dr. Rockey Situ would just discontinue medicine all together. Patient advised to stick with one medication and not go back and forth taking one pill one day and one the other as it sounds like she may be doing. Patient verbalized understanding and at this point says she would like to go back to taking furosemide 20 mg by mouth every other day. Patient will call us to update [place] if needed.  Routing to Dr Rockey Situ to make him aware.

## 2017-01-06 ENCOUNTER — Other Ambulatory Visit: Payer: Self-pay | Admitting: Cardiovascular Disease

## 2017-01-14 ENCOUNTER — Other Ambulatory Visit: Payer: Self-pay | Admitting: Cardiovascular Disease

## 2017-02-07 ENCOUNTER — Other Ambulatory Visit: Payer: Self-pay | Admitting: Cardiovascular Disease

## 2017-02-12 ENCOUNTER — Other Ambulatory Visit: Payer: Self-pay

## 2017-02-12 ENCOUNTER — Telehealth: Payer: Self-pay | Admitting: Cardiovascular Disease

## 2017-02-12 DIAGNOSIS — I5022 Chronic systolic (congestive) heart failure: Secondary | ICD-10-CM

## 2017-02-12 MED ORDER — SACUBITRIL-VALSARTAN 24-26 MG PO TABS: 1.0000 | ORAL_TABLET | Freq: Two times a day (BID) | ORAL | 0 refills | Status: DC

## 2017-02-12 NOTE — Telephone Encounter (Signed)
°*  STAT* If patient is at the pharmacy, call can be transferred to refill team.   1. Which medications need to be refilled? (please list name of each medication and dose if known) ENTRESTO 24-26 MG   2. Which pharmacy/location (including street and city if local pharmacy) is medication to be sent to? [facility] on [address]  3. Do they need a 30 day or 90 day supply? 90 day

## 2017-03-06 ENCOUNTER — Telehealth: Payer: Self-pay | Admitting: Cardiovascular Disease

## 2017-03-06 NOTE — Telephone Encounter (Addendum)
Entresto PA sent through Cover My Meds.  Key [id number]. Awaiting response #[id number]

## 2017-03-07 NOTE — Telephone Encounter (Signed)
Received fax from [facility] stating "there is an existing approval on file for this medication" covered until March 03, 2018.

## 2017-03-12 ENCOUNTER — Telehealth: Payer: Self-pay | Admitting: Cardiovascular Disease

## 2017-03-12 MED ORDER — CLOPIDOGREL BISULFATE 75 MG PO TABS: 75.0000 mg | ORAL_TABLET | Freq: Every day | ORAL | 2 refills | Status: DC

## 2017-03-12 NOTE — Telephone Encounter (Signed)
°*  STAT* If patient is at the pharmacy, call can be transferred to refill team.   1. Which medications need to be refilled? (please list name of each medication and dose if known)  Clopidogrel   2. Which pharmacy/location (including street and city if local pharmacy) is [facility] [address]  3. Do they need a 30 day or 90 day supply? 30 day

## 2017-03-14 ENCOUNTER — Ambulatory Visit: Payer: Self-pay | Admitting: *Deleted

## 2017-03-14 ENCOUNTER — Telehealth: Payer: Self-pay | Admitting: Cardiovascular Disease

## 2017-03-14 NOTE — Telephone Encounter (Signed)
If worse before appt go to ED  [place]

## 2017-03-14 NOTE — Telephone Encounter (Signed)
Pt reports moderate SOB, intermittent, occurs mostly at night, "couple episodes during the day." Denies CP, dizziness. Able to speak through episodes, has been sleeping sitting up but does this on occasion due to back pain. O2 sats during episodes "high 90s". 98% last night. Checked during call, 95% with HR 94. Reports anxiety at times, especially at night; occasional dry cough. H/O CHF, states "mild" swelling of ankles but "occurs sometimes"; wearing compression hose, elevation. Has not checked weight.  Appt made with Dr. Terese Door for Monday 03/17/17.Home care advice given. Instructed to call back if symptoms worsen. Reason for Disposition . [1] MODERATE longstanding difficulty breathing (e.g., speaks in phrases, SOB even at rest, pulse 100-120) AND [2] SAME as normal  Answer Assessment - Initial Assessment Questions 1. RESPIRATORY STATUS: "Describe your breathing?" (e.g., wheezing, shortness of breath, unable to speak, severe coughing)      Shortness of breath, can speak without difficulty 2. ONSET: "When did this breathing problem begin?"      2 weeks ago 3. PATTERN "Does the difficult breathing come and go, or has it been constant since it started?"      Intermittent 4. SEVERITY: "How bad is your breathing?" (e.g., mild, moderate, severe)    - MILD: No SOB at rest, mild SOB with walking, speaks normally in sentences, can lay down, no retractions, pulse < 100.    - MODERATE: SOB at rest, SOB with minimal exertion and prefers to sit, cannot lie down flat, speaks in phrases, mild retractions, audible wheezing, pulse 100-120.    - SEVERE: Very SOB at rest, speaks in single words, struggling to breathe, sitting hunched forward, retractions, pulse > 120      Moderate, sleeping sitting up, but has been doing this due to back pain. Has O2 monitor 98% during episode, HR85. Checked during call, Sat 95%, HR 94 5. RECURRENT SYMPTOM: "Have you had difficulty breathing before?" If so, ask: "When was the  last time?" and "What happened that time?"      no 6. CARDIAC HISTORY: "Do you have any history of heart disease?" (e.g., heart attack, angina, bypass surgery, angioplasty)      Stents placed 10/2014 7. LUNG HISTORY: "Do you have any history of lung disease?"  (e.g., pulmonary embolus, asthma, emphysema)     no 8. CAUSE: "What do you think is causing the breathing problem?"      Unsure, may be "nerves" 9. OTHER SYMPTOMS: "Do you have any other symptoms? (e.g., dizziness, runny nose, cough, chest pain, fever)     Intermittent cough, nonproductive,no fever,no dizziness, increased BLE weakness. 11. TRAVEL: "Have you traveled out of the country in the last month?" (e.g., travel history, exposures)       no  Protocols used: BREATHING DIFFICULTY-A-AH

## 2017-03-14 NOTE — Telephone Encounter (Signed)
Patient aware.

## 2017-03-14 NOTE — Telephone Encounter (Signed)
Spoke with patient and she reports shortness of breath when walking from one room to the other and she has been huffing and puffing. She also states that she is not able to lay down to sleep at night well. She states that she would like to have something for anxiety because she feels this is what is wrong with her. Advised that we do not prescribe those types of medications and that she should check with her PCP or psychiatrist. She stated that all of this news and stuff in [place] is just making her scared and she doesn't even want to go out. Reviewed that she could take her fluid pill furosemide daily for 3 days to see if that will help with her breathing but that she should consider appointment in our Heart Failure Clinic and then check with PCP for any anxiety medications. She refuses appointment with CHF clinic. She has appointment scheduled on Monday to see her PCP and advised that if symptoms persist or worsen to give [place] a call back. She verbalized understanding with no further questions at this time.

## 2017-03-14 NOTE — Telephone Encounter (Signed)
Pt c/o Shortness Of Breath: STAT if SOB developed within the last 24 hours or pt is noticeably SOB on the phone  1. Are you currently SOB (can you hear that pt is SOB on the phone)? Yes not audible   2. How long have you been experiencing SOB? 2-3 wks   3. Are you SOB when sitting or when up moving around? Both comes and goes   4. Are you currently experiencing any other symptoms? Sob when laying down , weak ,  Interim trouble swallowing , cough    Patient wants to be seen today

## 2017-03-14 NOTE — Telephone Encounter (Signed)
Please advise 

## 2017-03-17 ENCOUNTER — Encounter: Payer: Self-pay | Admitting: Internal Medicine

## 2017-03-17 ENCOUNTER — Ambulatory Visit: Payer: Self-pay | Admitting: *Deleted

## 2017-03-17 ENCOUNTER — Ambulatory Visit (INDEPENDENT_AMBULATORY_CARE_PROVIDER_SITE_OTHER): Payer: PPO

## 2017-03-17 ENCOUNTER — Ambulatory Visit: Payer: PPO

## 2017-03-17 ENCOUNTER — Telehealth: Payer: Self-pay | Admitting: Cardiovascular Disease

## 2017-03-17 ENCOUNTER — Ambulatory Visit (INDEPENDENT_AMBULATORY_CARE_PROVIDER_SITE_OTHER): Payer: PPO | Admitting: Internal Medicine

## 2017-03-17 VITALS — BP 110/74 | HR 102 | Temp 97.5°F | Ht 60.0 in | Wt 118.0 lb

## 2017-03-17 DIAGNOSIS — R0600 Dyspnea, unspecified: Secondary | ICD-10-CM | POA: Diagnosis not present

## 2017-03-17 DIAGNOSIS — R609 Edema, unspecified: Secondary | ICD-10-CM | POA: Diagnosis not present

## 2017-03-17 DIAGNOSIS — R0602 Shortness of breath: Secondary | ICD-10-CM

## 2017-03-17 DIAGNOSIS — I5022 Chronic systolic (congestive) heart failure: Secondary | ICD-10-CM

## 2017-03-17 DIAGNOSIS — R600 Localized edema: Secondary | ICD-10-CM | POA: Diagnosis not present

## 2017-03-17 DIAGNOSIS — F419 Anxiety disorder, unspecified: Secondary | ICD-10-CM | POA: Diagnosis not present

## 2017-03-17 DIAGNOSIS — J90 Pleural effusion, not elsewhere classified: Secondary | ICD-10-CM | POA: Diagnosis not present

## 2017-03-17 MED ORDER — FUROSEMIDE 20 MG PO TABS: ORAL_TABLET | ORAL | 1 refills | Status: DC

## 2017-03-17 MED ORDER — CLONAZEPAM 0.5 MG PO TABS: ORAL_TABLET | ORAL | 0 refills | Status: DC

## 2017-03-17 MED ORDER — CITALOPRAM HYDROBROMIDE 10 MG PO TABS: 10.0000 mg | ORAL_TABLET | Freq: Every day | ORAL | 2 refills | Status: DC

## 2017-03-17 NOTE — Patient Instructions (Addendum)
Please increase lasix 20 mg (2 pills in am x 5 days) then back to your regular dose  Follow up Friday  CXR today  With kidney #s check, troponin (heart enzyme) and d dimer to work up for blood clot in lungs Klonopin 0.5 1-2 x per day as needed  Celexa 10 mg daily in am Follow up Friday go to the emergency room if worsening     Generalized Anxiety Disorder, Adult Generalized anxiety disorder (GAD) is a mental health disorder. People with this condition constantly worry about everyday events. Unlike normal anxiety, worry related to GAD is not triggered by a specific event. These worries also do not fade or get better with time. GAD interferes with life functions, including relationships, work, and school. GAD can vary from mild to severe. People with severe GAD can have intense waves of anxiety with physical symptoms (panic attacks). What are the causes? The exact cause of GAD is not known. What increases the risk? This condition is more likely to develop in:  Women.  People who have a family history of anxiety disorders.  People who are very shy.  People who experience very stressful life events, such as the death of a loved one.  People who have a very stressful family environment.  What are the signs or symptoms? People with GAD often worry excessively about many things in their lives, such as their health and family. They may also be overly concerned about:  Doing well at work.  Being on time.  Natural disasters.  Friendships.  Physical symptoms of GAD include:  Fatigue.  Muscle tension or having muscle twitches.  Trembling or feeling shaky.  Being easily startled.  Feeling like your heart is pounding or racing.  Feeling out of breath or like you cannot take a deep breath.  Having trouble falling asleep or staying asleep.  Sweating.  Nausea, diarrhea, or irritable bowel syndrome (IBS).  Headaches.  Trouble concentrating or remembering  facts.  Restlessness.  Irritability.  How is this diagnosed? Your health care provider can diagnose GAD based on your symptoms and medical history. You will also have a physical exam. The health care provider will ask specific questions about your symptoms, including how severe they are, when they started, and if they come and go. Your health care provider may ask you about your use of alcohol or drugs, including prescription medicines. Your health care provider may refer you to a mental health specialist for further evaluation. Your health care provider will do a thorough examination and may perform additional tests to rule out other possible causes of your symptoms. To be diagnosed with GAD, a person must have anxiety that:  Is out of his or her control.  Affects several different aspects of his or her life, such as work and relationships.  Causes distress that makes him or her unable to take part in normal activities.  Includes at least three physical symptoms of GAD, such as restlessness, fatigue, trouble concentrating, irritability, muscle tension, or sleep problems.  Before your health care provider can confirm a diagnosis of GAD, these symptoms must be present more days than they are not, and they must last for six months or longer. How is this treated? The following therapies are usually used to treat GAD:  Medicine. Antidepressant medicine is usually prescribed for long-term daily control. Antianxiety medicines may be added in severe cases, especially when panic attacks occur.  Talk therapy (psychotherapy). Certain types of talk therapy can be helpful in  treating GAD by providing support, education, and guidance. Options include: ? Cognitive behavioral therapy (CBT). People learn coping skills and techniques to ease their anxiety. They learn to identify unrealistic or negative thoughts and behaviors and to replace them with positive ones. ? Acceptance and commitment therapy (ACT).  This treatment teaches people how to be mindful as a way to cope with unwanted thoughts and feelings. ? Biofeedback. This process trains you to manage your body's response (physiological response) through breathing techniques and relaxation methods. You will work with a therapist while machines are used to monitor your physical symptoms.  Stress management techniques. These include yoga, meditation, and exercise.  A mental health specialist can help determine which treatment is best for you. Some people see improvement with one type of therapy. However, other people require a combination of therapies. Follow these instructions at home:  Take over-the-counter and prescription medicines only as told by your health care provider.  Try to maintain a normal routine.  Try to anticipate stressful situations and allow extra time to manage them.  Practice any stress management or self-calming techniques as taught by your health care provider.  Do not punish yourself for setbacks or for not making progress.  Try to recognize your accomplishments, even if they are small.  Keep all follow-up visits as told by your health care provider. This is important. Contact a health care provider if:  Your symptoms do not get better.  Your symptoms get worse.  You have signs of depression, such as: ? A persistently sad, cranky, or irritable mood. ? Loss of enjoyment in activities that used to bring you joy. ? Change in weight or eating. ? Changes in sleeping habits. ? Avoiding friends or family members. ? Loss of energy for normal tasks. ? Feelings of guilt or worthlessness. Get help right away if:  You have serious thoughts about hurting yourself or others. If you ever feel like you may hurt yourself or others, or have thoughts about taking your own life, get help right away. You can go to your nearest emergency department or call:  Your local emergency services (911 in the [place]  A suicide  crisis helpline, such as the [place] at 612-388-1049. This is open 24 hours a day.  Summary  Generalized anxiety disorder (GAD) is a mental health disorder that involves worry that is not triggered by a specific event.  People with GAD often worry excessively about many things in their lives, such as their health and family.  GAD may cause physical symptoms such as restlessness, trouble concentrating, sleep problems, frequent sweating, nausea, diarrhea, headaches, and trembling or muscle twitching.  A mental health specialist can help determine which treatment is best for you. Some people see improvement with one type of therapy. However, other people require a combination of therapies. This information is not intended to replace advice given to you by your health care provider. Make sure you discuss any questions you have with your health care provider. Document Released: 06/15/2012 Document Revised: 01/09/2016 Document Reviewed: 01/09/2016 Elsevier Interactive Patient Education  2018 [place].  Heart Failure Heart failure is a condition in which the heart has trouble pumping blood because it has become weak or stiff. This means that the heart does not pump blood efficiently for the body to work well. For some people with heart failure, fluid may back up into the lungs and there may be swelling (edema) in the lower legs. Heart failure is usually a long-term (chronic)  condition. It is important for you to take good care of yourself and follow the treatment plan from your health care provider. What are the causes? This condition is caused by some health problems, including:  High blood pressure (hypertension). Hypertension causes the heart muscle to work harder than normal. High blood pressure eventually causes the heart to become stiff and weak.  Coronary artery disease (CAD). CAD is the buildup of cholesterol and fat (plaques) in the arteries of the  heart.  Heart attack (myocardial infarction). Injured tissue, which is caused by the heart attack, does not contract as well and the heart's ability to pump blood is weakened.  Abnormal heart valves. When the heart valves do not open and close properly, the heart muscle must pump harder to keep the blood flowing.  Heart muscle disease (cardiomyopathy or myocarditis). Heart muscle disease is damage to the heart muscle from a variety of causes, such as drug or alcohol abuse, infections, or unknown causes. These can increase the risk of heart failure.  Lung disease. When the lungs do not work properly, the heart must work harder.  What increases the risk? Risk of heart failure increases as a person ages. This condition is also more likely to develop in people who:  Are overweight.  Are female.  Smoke or chew tobacco.  Abuse alcohol or illegal drugs.  Have taken medicines that can damage the heart, such as chemotherapy drugs.  Have diabetes. ? High blood sugar (glucose) is associated with high fat (lipid) levels in the blood. ? Diabetes can also damage tiny blood vessels that carry nutrients to the heart muscle.  Have abnormal heart rhythms.  Have thyroid problems.  Have low blood counts (anemia).  What are the signs or symptoms? Symptoms of this condition include:  Shortness of breath with activity, such as when climbing stairs.  Persistent cough.  Swelling of the feet, ankles, legs, or abdomen.  Unexplained weight gain.  Difficulty breathing when lying flat (orthopnea).  Waking from sleep because of the need to sit up and get more air.  Rapid heartbeat.  Fatigue and loss of energy.  Feeling light-headed, dizzy, or close to fainting.  Loss of appetite.  Nausea.  Increased urination during the night (nocturia).  Confusion.  How is this diagnosed? This condition is diagnosed based on:  Medical history, symptoms, and a physical exam.  Diagnostic tests, which  may include: ? Echocardiogram. ? Electrocardiogram (ECG). ? Chest X-ray. ? Blood tests. ? Exercise stress test. ? Radionuclide scans. ? Cardiac catheterization and angiogram.  How is this treated? Treatment for this condition is aimed at managing the symptoms of heart failure. Medicines, behavioral changes, or other treatments may be necessary to treat heart failure. Medicines These may include:  Angiotensin-converting enzyme (ACE) inhibitors. This type of medicine blocks the effects of a blood protein called angiotensin-converting enzyme. ACE inhibitors relax (dilate) the blood vessels and help to lower blood pressure.  Angiotensin receptor blockers (ARBs). This type of medicine blocks the actions of a blood protein called angiotensin. ARBs dilate the blood vessels and help to lower blood pressure.  Water pills (diuretics). Diuretics cause the kidneys to remove salt and water from the blood. The extra fluid is removed through urination, leaving a lower volume of blood that the heart has to pump.  Beta blockers. These improve heart muscle strength and they prevent the heart from beating too quickly.  Digoxin. This increases the force of the heartbeat.  Healthy behavior changes These may include:  Reaching and maintaining a healthy weight.  Stopping smoking or chewing tobacco.  Eating heart-healthy foods.  Limiting or avoiding alcohol.  Stopping use of street drugs (illegal drugs).  Physical activity.  Other treatments These may include:  Surgery to open blocked coronary arteries or repair damaged heart valves.  Placement of a biventricular pacemaker to improve heart muscle function (cardiac resynchronization therapy). This device paces both the right ventricle and left ventricle.  Placement of a device to treat serious abnormal heart rhythms (implantable cardioverter defibrillator, or ICD).  Placement of a device to improve the pumping ability of the heart (left  ventricular assist device, or LVAD).  Heart transplant. This can cure heart failure, and it is considered for certain patients who do not improve with other therapies.  Follow these instructions at home: Medicines  Take over-the-counter and prescription medicines only as told by your health care provider. Medicines are important in reducing the workload of your heart, slowing the progression of heart failure, and improving your symptoms. ? Do not stop taking your medicine unless your health care provider told you to do that. ? Do not skip any dose of medicine. ? Refill your prescriptions before you run out of medicine. You need your medicines every day. Eating and drinking   Eat heart-healthy foods. Talk with a dietitian to make an eating plan that is right for you. ? Choose foods that contain no trans fat and are low in saturated fat and cholesterol. Healthy choices include fresh or frozen fruits and vegetables, fish, lean meats, legumes, fat-free or low-fat dairy products, and whole-grain or high-fiber foods. ? Limit salt (sodium) if directed by your health care provider. Sodium restriction may reduce symptoms of heart failure. Ask a dietitian to recommend heart-healthy seasonings. ? Use healthy cooking methods instead of frying. Healthy methods include roasting, grilling, broiling, baking, poaching, steaming, and stir-frying.  Limit your fluid intake if directed by your health care provider. Fluid restriction may reduce symptoms of heart failure. Lifestyle  Stop smoking or using chewing tobacco. Nicotine and tobacco can damage your heart and your blood vessels. Do not use nicotine gum or patches before talking to your health care provider.  Limit alcohol intake to no more than 1 drink per day for non-pregnant women and 2 drinks per day for men. One drink equals 12 oz of beer, 5 oz of wine, or 1 oz of hard liquor. ? Drinking more than that is harmful to your heart. Tell your health care  provider if you drink alcohol several times a week. ? Talk with your health care provider about whether any level of alcohol use is safe for you. ? If your heart has already been damaged by alcohol or you have severe heart failure, drinking alcohol should be stopped completely.  Stop use of illegal drugs.  Lose weight if directed by your health care provider. Weight loss may reduce symptoms of heart failure.  Do moderate physical activity if directed by your health care provider. People who are elderly and people with severe heart failure should consult with a health care provider for physical activity recommendations. Monitor important information  Weigh yourself every day. Keeping track of your weight daily helps you to notice excess fluid sooner. ? Weigh yourself every morning after you urinate and before you eat breakfast. ? Wear the same amount of clothing each time you weigh yourself. ? Record your daily weight. Provide your health care provider with your weight record.  Monitor and record your blood pressure  as told by your health care provider.  Check your pulse as told by your health care provider. Dealing with extreme temperatures  If the weather is extremely hot: ? Avoid vigorous physical activity. ? Use air conditioning or fans or seek a cooler location. ? Avoid caffeine and alcohol. ? Wear loose-fitting, lightweight, and light-colored clothing.  If the weather is extremely cold: ? Avoid vigorous physical activity. ? Layer your clothes. ? Wear mittens or gloves, a hat, and a scarf when you go outside. ? Avoid alcohol. General instructions  Manage other health conditions such as hypertension, diabetes, thyroid disease, or abnormal heart rhythms as told by your health care provider.  Learn to manage stress. If you need help to do this, ask your health care provider.  Plan rest periods when fatigued.  Get ongoing education and support as needed.  Participate in or  seek rehabilitation as needed to maintain or improve independence and quality of life.  Stay up to date with immunizations. Keeping current on pneumococcal and influenza immunizations is especially important to prevent respiratory infections.  Keep all follow-up visits as told by your health care provider. This is important. Contact a health care provider if:  You have a rapid weight gain.  You have increasing shortness of breath that is unusual for you.  You are unable to participate in your usual physical activities.  You tire easily.  You cough more than normal, especially with physical activity.  You have any swelling or more swelling in areas such as your hands, feet, ankles, or abdomen.  You are unable to sleep because it is hard to breathe.  You feel like your heart is beating quickly (palpitations).  You become dizzy or light-headed when you stand up. Get help right away if:  You have difficulty breathing.  You notice or your family notices a change in your awareness, such as having trouble staying awake or having difficulty with concentration.  You have pain or discomfort in your chest.  You have an episode of fainting (syncope). This information is not intended to replace advice given to you by your health care provider. Make sure you discuss any questions you have with your health care provider. Document Released: 02/18/2005 Document Revised: 10/24/2015 Document Reviewed: 09/13/2015 Elsevier Interactive Patient Education  2018 [place].  Heart Failure Action Plan A heart failure action plan helps you understand what to do when you have symptoms of heart failure. Follow the plan that was created by you and your health care provider. Review your plan each time you visit your health care provider. Red zone These signs and symptoms mean you should get medical help right away:  You have trouble breathing when resting.  You have a dry cough that is getting  worse.  You have swelling or pain in your legs or abdomen that is getting worse.  You suddenly gain more than 2-3 lb (0.9-1.4 kg) in a day, or more than 5 lb (2.3 kg) in one week. This amount may be more or less depending on your condition.  You have trouble staying awake or you feel confused.  You have chest pain.  You do not have an appetite.  You pass out.  If you experience any of these symptoms:  Call your local emergency services (911 in the [place] right away or seek help at the emergency department of the nearest hospital.  Yellow zone These signs and symptoms mean your condition may be getting worse and you should make some changes:  You have trouble breathing when you are active or you need to sleep with extra pillows.  You have swelling in your legs or abdomen.  You gain 2-3 lb (0.9-1.4 kg) in one day, or 5 lb (2.3 kg) in one week. This amount may be more or less depending on your condition.  You get tired easily.  You have trouble sleeping.  You have a dry cough.  If you experience any of these symptoms:  Contact your health care provider within the next day.  Your health care provider may adjust your medicines.  Green zone These signs mean you are doing well and can continue what you are doing:  You do not have shortness of breath.  You have very little swelling or no new swelling.  Your weight is stable (no gain or loss).  You have a normal activity level.  You do not have chest pain or any other new symptoms.  Follow these instructions at home:  Take over-the-counter and prescription medicines only as told by your health care provider.  Weigh yourself daily. Your target weight is __________ lb (__________ kg). ? Call your health care provider if you gain more than __________ lb (__________ kg) in a day, or more than __________ lb (__________ kg) in one week.  Eat a heart-healthy diet. Work with a diet and nutrition specialist (dietitian) to  create an eating plan that is best for you.  Keep all follow-up visits as told by your health care provider. This is important. Where to find more information:  American Heart Association: www.heart.org Summary  Follow the action plan that was created by you and your health care provider.  Get help right away if you have any symptoms in the Red zone. This information is not intended to replace advice given to you by your health care provider. Make sure you discuss any questions you have with your health care provider. Document Released: 03/30/2016 Document Revised: 03/30/2016 Document Reviewed: 03/30/2016 Elsevier Interactive Patient Education  2018 [place] of Breath, Adult Shortness of breath means you have trouble breathing. Your lungs are organs for breathing. Follow these instructions at home: Pay attention to any changes in your symptoms. Take these actions to help with your condition:  Do not smoke. Smoking can cause shortness of breath. If you need help to quit smoking, ask your doctor.  Avoid things that can make it harder to breathe, such as: ? Mold. ? Dust. ? Air pollution. ? Chemical smells. ? Things that can cause allergy symptoms (allergens), if you have allergies.  Keep your living space clean and free of mold and dust.  Rest as needed. Slowly return to your usual activities.  Take over-the-counter and prescription medicines, including oxygen and inhaled medicines, only as told by your doctor.  Keep all follow-up visits as told by your doctor. This is important.  Contact a doctor if:  Your condition does not get better as soon as expected.  You have a hard time doing your normal activities, even after you rest.  You have new symptoms. Get help right away if:  You have trouble breathing when you are resting.  You feel light-headed or you faint.  You have a cough that is not helped by medicines.  You cough up blood.  You have pain  with breathing.  You have pain in your chest, arms, shoulders, or belly (abdomen).  You have a fever.  You cannot walk up stairs.  You cannot exercise the way you  normally do. This information is not intended to replace advice given to you by your health care provider. Make sure you discuss any questions you have with your health care provider. Document Released: 08/07/2007 Document Revised: 03/07/2016 Document Reviewed: 03/07/2016 Elsevier Interactive Patient Education  2017 [employer].

## 2017-03-17 NOTE — Telephone Encounter (Signed)
FYI

## 2017-03-17 NOTE — Telephone Encounter (Signed)
S/w Amy at [email or web address]. They have not received approval from [facility] when they have reached out to them.  Called [phone]. S/w with [employer] representative who confirmed that Delene Loll was approved until 03-03-2018. PA approval number [id number].  Called [email or web address] back and let them know about determination approval. No further action needed at this time.

## 2017-03-17 NOTE — Telephone Encounter (Signed)
[employer] calling stating the PA on entresto was not received by [facility]   They are asking if we may please re fax this to them   Fax: [phone]

## 2017-03-17 NOTE — Progress Notes (Signed)
Chief Complaint  Patient presents with  . Shortness of Breath   Follow up with husband 1. Sob x 1 week w/o chest pain and Tuesday had diarrhea.  2. Increased anxiety b/c she feels something is wrong and she has moved 2x w/in the last year. She has Klonopin 0.5 and old tx. She also is having insomnia 3. H/o CHF with 6 lb wt gain since last appt, leg swelling, sob, O2 sat at home 90s-100s. She sleeps in a lift chair due to not being able to lie back/flat due to chf and chronic back pain. She follows with Dr. Rockey Situ and is on Entresto 24/26, BB, Lasix she is taking qod    Review of Systems  Constitutional: Negative for weight loss.       +wt gain   HENT: Negative for hearing loss.        +runny nose   Eyes:       No vision problems   Respiratory: Positive for shortness of breath.        +sob with rest and exertion   Cardiovascular: Positive for orthopnea and leg swelling. Negative for chest pain.  Gastrointestinal: Positive for diarrhea.  Musculoskeletal: Positive for back pain.  Skin: Negative for rash.  Psychiatric/Behavioral: The patient is nervous/anxious.    Past Medical History:  Diagnosis Date  . Anxiety   . Arthritis   . CAD (coronary artery disease)    a. cardiac cath 10/2014: ostial LAD 30%, prox LAD 90% s/p PCI/DES, ostial RCA 30%, mid RCA 90% s/p PCI/DES, ostial Ramus 50%  . Chronic back pain   . Chronic fatigue   . Chronic systolic CHF (congestive heart failure) ([place])    a. echo 10/2014: EF 20%, AK inf, inf-sep, distal ant, & apical walls, HK elsewhere. Cavity size mildly dilated. Wall thickness nl. mild AI. Mild to mod MR. LA mildly dilated. RV sys fxn mod to severely reduced. small pericardial effusion was ID'd.   . Hepatitis C   . Ischemic cardiomyopathy   . Osteoporosis   . UTI (lower urinary tract infection)    Past Surgical History:  Procedure Laterality Date  . BACK SURGERY  07/14   T10, L1  . CARDIAC CATHETERIZATION Bilateral 10/27/2014   Procedure: Left  Heart Cath and Coronary Angiography;  Surgeon: Minna Merritts, MD;  Location: [facility];  Service: Cardiovascular;  Laterality: Bilateral;  . CARDIAC CATHETERIZATION N/A 10/27/2014   Procedure: Coronary Stent Intervention;  Surgeon: Wellington Hampshire, MD;  Location: [facility];  Service: Cardiovascular;  Laterality: N/A;   Family History  Problem Relation Age of Onset  . Arthritis Mother   . Heart disease Father        passed at 4  . Diabetes Father    Social History   Socioeconomic History  . Marital status: Married    Spouse name: Danyella Mcginty  . Number of children: 0  . Years of education: 1  . Highest education level: Not on file  Social Needs  . Financial resource strain: Not on file  . Food insecurity - worry: Not on file  . Food insecurity - inability: Not on file  . Transportation needs - medical: Not on file  . Transportation needs - non-medical: Not on file  Occupational History  . Occupation: Receptionist    Comment: Retired in [place] Use  . Smoking status: Former Smoker    Packs/day: 1.00    Years: 10.00    Pack years:  10.00  . Smokeless tobacco: Never Used  Substance and Sexual Activity  . Alcohol use: No    Alcohol/week: 0.0 oz  . Drug use: No  . Sexual activity: Yes    Birth control/protection: Post-menopausal  Other Topics Concern  . Not on file  Social History Narrative   Pt is 77 yo female, married to husband of 21 years.  Pt has no children.  Pt lives with husband and teacup poodle, [name]. Pt states she hurt her back last year and has not been able to participate in much physical activity since. Pt enjoys traveling with her husband in motor home, doing crossword puzzles with husband, and playing cards with friends.  Pt was raised in [place] [facility] and has lived in [place] entire adult life.  Pt worked in [employer] in [place] until [place] when they closed.     Current Meds  Medication Sig  . aspirin EC 81 MG tablet Take  81 mg by mouth daily.  Marland Kitchen atorvastatin (LIPITOR) 80 MG tablet TAKE 1 TABLET (80 MG TOTAL) BY MOUTH DAILY AT 6 PM.  . Calcium-Vitamin A-Vitamin D (LIQUID CALCIUM PO) Take by mouth. 1 tablespoon daily  . cholecalciferol (VITAMIN D) 1000 UNITS tablet Take 1,000 Units by mouth daily.  . clopidogrel (PLAVIX) 75 MG tablet Take 1 tablet (75 mg total) by mouth daily.  . clopidogrel (PLAVIX) 75 MG tablet Take 1 tablet (75 mg total) by mouth daily.  Marland Kitchen denosumab (PROLIA) 60 MG/ML SOLN injection Inject 60 mg into the skin every 6 (six) months.  Marland Kitchen KLOR-CON 10 10 MEQ tablet TAKE 1 TABLET (10 MEQ TOTAL) BY MOUTH EVERY OTHER DAY.  . Melatonin 3 MG TABS Take by mouth as needed.  . metoprolol succinate (TOPROL-XL) 25 MG 24 hr tablet TAKE 1 TABLET (25 MG TOTAL) BY MOUTH AT BEDTIME.  Marland Kitchen ondansetron (ZOFRAN-ODT) 4 MG disintegrating tablet TAKE 1 TABLET BY MOUTH TWICE A DAY AS NEEDED FOR NAUSEA AND VOMITING  . sacubitril-valsartan (ENTRESTO) 24-26 MG Take 1 tablet by mouth 2 (two) times daily.   Allergies  Allergen Reactions  . Sugar-Protein-Starch Nausea And Vomiting    Only to sugar   No results found for this or any previous visit (from the past 2160 hour(s)). Objective  Body mass index is 23.05 kg/m. Wt Readings from Last 3 Encounters:  03/17/17 118 lb (53.5 kg)  11/18/16 112 lb 6.4 oz (51 kg)  11/18/16 111 lb 8 oz (50.6 kg)   Temp Readings from Last 3 Encounters:  03/17/17 (!) 97.5 F (36.4 C) (Oral)  11/18/16 98.3 F (36.8 C) (Oral)  04/19/16 97.6 F (36.4 C) (Oral)   BP Readings from Last 3 Encounters:  03/17/17 110/74  11/18/16 (!) 116/58  11/18/16 135/73   Pulse Readings from Last 3 Encounters:  03/17/17 (!) 102  11/18/16 75  11/18/16 82   O2 sat room air 95% Physical Exam  Constitutional: She is oriented to person, place, and time and well-developed, well-nourished, and in no distress.  HENT:  Head: Normocephalic and atraumatic.  Mouth/Throat: Oropharynx is clear and moist and  mucous membranes are normal.  Eyes: Conjunctivae are normal. Pupils are equal, round, and reactive to light.  Cardiovascular: Regular rhythm and normal heart sounds. Tachycardia present.  2+ leg edema b/l   Pulmonary/Chest: Effort normal and breath sounds normal. She has no wheezes.  Neurological: She is alert and oriented to person, place, and time. Gait normal.  BL walks with walker   Skin: Skin is  warm and dry.  Psychiatric: Memory, affect and judgment normal. Her mood appears anxious.  Nursing note and vitals reviewed.   Assessment   1. SOB rest and exertion ddx CHF given chronic systolic CHF with orthopnea, wt gain (6 lbs since last visit), leg edema other ddx r/o PE, CXR to w/u pneumonia. Pt is former smoker though no wheezing on exam, anxiety level uncontrolled. Pt also has h/o CAD s/p stent  Plan  1. Increase lasix to 40 mg x 5 days f/u Friday to check legs and wt  Stat CXR today, troponin, BMET, d dimer  Klonopin 0.5 qd to bid prn add Celexa 10 mg  Cont CHF meds Provider: Dr. Olivia Mackie McLean-Scocuzza-Internal Medicine

## 2017-03-17 NOTE — Telephone Encounter (Signed)
Pt called complaining of shortness of breath which started 03/12/17; she also had diarrhea at that time; pt states that she uses a fan to blow air in her face so that she can get air; she uses a walker/cane, and can not stand up in the shower by herself because she is weak; per nurse triage nurse recommendation made for pt to go to ED but she refuses; she will keep her 1600 appointment today with Olivia Mackie McLean-Scocuzza; she would like to be put on the wait list if someone cancels an earlier appointment; pt can be contacted at [phone]; will route to [facility] for notification. Reason for Disposition . [1] MODERATE difficulty breathing (e.g., speaks in phrases, SOB even at rest, pulse 100-120) AND [2] NEW-onset or WORSE than normal  Answer Assessment - Initial Assessment Questions 1. RESPIRATORY STATUS: "Describe your breathing?" (e.g., wheezing, shortness of breath, unable to speak, severe coughing)      Short of breath 2. ONSET: "When did this breathing problem begin?"      Wednesday 03/11/17 3. PATTERN "Does the difficult breathing come and go, or has it been constant since it started?"      constant 4. SEVERITY: "How bad is your breathing?" (e.g., mild, moderate, severe)    - MILD: No SOB at rest, mild SOB with walking, speaks normally in sentences, can lay down, no retractions, pulse < 100.    - MODERATE: SOB at rest, SOB with minimal exertion and prefers to sit, cannot lie down flat, speaks in phrases, mild retractions, audible wheezing, pulse 100-120.    - SEVERE: Very SOB at rest, speaks in single words, struggling to breathe, sitting hunched forward, retractions, pulse > 120      moderate 5. RECURRENT SYMPTOM: "Have you had difficulty breathing before?" If so, ask: "When was the last time?" and "What happened that time?"      Yes 2016 2 stents placed 6. CARDIAC HISTORY: "Do you have any history of heart disease?" (e.g., heart attack, angina, bypass surgery, angioplasty)      Yes  stents place 7. LUNG HISTORY: "Do you have any history of lung disease?"  (e.g., pulmonary embolus, asthma, emphysema)     No; former smoker quit in the 1980s 8. CAUSE: "What do you think is causing the breathing problem?"      unsure 9. OTHER SYMPTOMS: "Do you have any other symptoms? (e.g., dizziness, runny nose, cough, chest pain, fever)     Diarhea, runny nose (she says fluid pill causes runny nose) 10. PREGNANCY: "Is there any chance you are pregnant?" "When was your last menstrual period?"       no 11. TRAVEL: "Have you traveled out of the country in the last month?" (e.g., travel history, exposures)       no  Protocols used: BREATHING DIFFICULTY-A-AH

## 2017-03-18 ENCOUNTER — Other Ambulatory Visit: Payer: Self-pay | Admitting: Internal Medicine

## 2017-03-18 ENCOUNTER — Telehealth: Payer: Self-pay | Admitting: Family

## 2017-03-18 ENCOUNTER — Telehealth: Payer: Self-pay | Admitting: Cardiovascular Disease

## 2017-03-18 DIAGNOSIS — R0602 Shortness of breath: Secondary | ICD-10-CM

## 2017-03-18 DIAGNOSIS — J189 Pneumonia, unspecified organism: Secondary | ICD-10-CM

## 2017-03-18 DIAGNOSIS — R7989 Other specified abnormal findings of blood chemistry: Secondary | ICD-10-CM

## 2017-03-18 LAB — BASIC METABOLIC PANEL
BUN / CREAT RATIO: 22 (calc) (ref 6–22)
BUN: 32 mg/dL — ABNORMAL HIGH (ref 7–25)
CHLORIDE: 109 mmol/L (ref 98–110)
CO2: 15 mmol/L — AB (ref 20–32)
CREATININE: 1.44 mg/dL — AB (ref 0.60–0.93)
Calcium: 9.4 mg/dL (ref 8.6–10.4)
Glucose, Bld: 128 mg/dL — ABNORMAL HIGH (ref 65–99)
POTASSIUM: 5 mmol/L (ref 3.5–5.3)
Sodium: 145 mmol/L (ref 135–146)

## 2017-03-18 LAB — TROPONIN I: Troponin I: 0.04 ng/mL (ref <=0.0)

## 2017-03-18 LAB — TIQ-NTM

## 2017-03-18 LAB — D-DIMER, QUANTITATIVE (NOT AT ARMC): D DIMER QUANT: 0.69 ug{FEU}/mL — AB (ref <0.50)

## 2017-03-18 MED ORDER — DOXYCYCLINE HYCLATE 100 MG PO TABS: 100.0000 mg | ORAL_TABLET | Freq: Two times a day (BID) | ORAL | 0 refills | Status: DC

## 2017-03-18 NOTE — Telephone Encounter (Signed)
Please call regarding her Lasix doseage. States it is not helping.

## 2017-03-18 NOTE — Progress Notes (Signed)
Spoke with pt she declines CTA chest  She took 2 lasix pills and 1 other pill old Rx for fluid not sure name I will call [facility] [phone] and see the name of medication with Rx [phone] or [phone]   Will start on doxy bid x 1 week   Qu[place]

## 2017-03-18 NOTE — Telephone Encounter (Signed)
Copied from [place] [id number]. Topic: Quick Communication - See Telephone Encounter >> Mar 18, 2017  1:05 PM Boyd Kerbs wrote: CRM for notification. See Telephone encounter for:   Patient called back with medication name : Torsemige (water pill)   wanting to get this medicine back.   Gabriel Cirri Had called and asked for previous medication name 03/18/17.

## 2017-03-18 NOTE — Telephone Encounter (Signed)
Patient seen 03/17/16

## 2017-03-18 NOTE — Telephone Encounter (Addendum)
Patient called in wanting to know what fluid medications she was on previously taking because she doesn't think the current meds are not working. She thought it started with a "T" but just couldn't remember it. Reviewed medications and advised that she was briefly on this over 2 years ago. Advised patient to check with her PCP if she feels that medication is not working for her. She verbalized understanding of our conversation, agreement to check with PCP, and had no further questions at this time.

## 2017-03-18 NOTE — Telephone Encounter (Signed)
Please advise 

## 2017-03-19 ENCOUNTER — Telehealth: Payer: Self-pay

## 2017-03-19 NOTE — Telephone Encounter (Signed)
-----   Message from Delorise Jackson, MD sent at 03/19/2017  2:20 PM EST ----- Please increase hydration with water but less than 1.5 liters per day   Thanks tMS

## 2017-03-19 NOTE — Telephone Encounter (Signed)
Patient advised of below  And verbalized understanding.  She states taking 2 pills of furosemide isn't working.  She took old pill torsemide , along then one of furosemide around 12:00 pm  and it helped to increase  Urination    She states she hasn't got script for Doxycyline f .  Called pharmacy and they have script ready for pick up, script has been ready since 03/18/17.  Patient is requesting refill on turosemide pharmacy has contacted cardiology.  I advised patient to take Lasix as you prescribed until otherwise directed by you. Also advised patient script ready for pick up for antibiotic to begin taking.   I advised that you were out of office this pm and we would contact her in the am.

## 2017-03-20 ENCOUNTER — Telehealth: Payer: Self-pay | Admitting: Cardiovascular Disease

## 2017-03-20 NOTE — Telephone Encounter (Signed)
Spoke with pt to inform her that this medication is given to her by her heart doctor Dr. Rockey Situ. The pt stated that she has contacted them.

## 2017-03-20 NOTE — Telephone Encounter (Signed)
Patient calling to say that she is retaining fluid She had been put on furosemide but feels she should placed back on the medication she was taking prior - believes it began with a T Please call to discuss

## 2017-03-20 NOTE — Telephone Encounter (Signed)
S/w patient.  She went to PCP on Monday and was advised to take Furosemide 40 mg in am x5 days then resume normal dose.  Patient states she did take furosemide Tuesday morning and by noon she had not urinated. Then, she decided to take an old prescription of Torsemide 5 mg by mouth.  She took torsemide on Tuesday at noon, Wednesday and Thursday. She said as soon as the took the Torsemide she started urinating. She reports her shortness of breath and lower extremity swelling has improved.  She would like Dr Rockey Situ to call in a new prescription for torsemide in place of furosemide.  Advised patient I would send to Dr Rockey Situ for advice and to keep upcoming appointment with PCP on 03/24/17.

## 2017-03-21 ENCOUNTER — Other Ambulatory Visit: Payer: Self-pay | Admitting: Cardiovascular Disease

## 2017-03-21 ENCOUNTER — Other Ambulatory Visit: Payer: Self-pay | Admitting: Internal Medicine

## 2017-03-21 DIAGNOSIS — R609 Edema, unspecified: Secondary | ICD-10-CM

## 2017-03-21 MED ORDER — TORSEMIDE 10 MG PO TABS: 10.0000 mg | ORAL_TABLET | Freq: Every day | ORAL | 0 refills | Status: DC

## 2017-03-21 NOTE — Telephone Encounter (Signed)
Spoke with patient she picked up antibiotic.   Advised her not to take both lasix and torsemide.  She states she was unable to urinate while taking furosemide.  Only wants to take toresmide.   Advised her to make followup appointment with cardiology.  [facility] and verified that she picked up script for Doxycyline spoke with Annie Main she picked up on 03/22/17. She has appointment on 03/24/17

## 2017-03-21 NOTE — Progress Notes (Signed)
Pt feels like old Rx Torsemide 20 mg 1/2 pill works better for edema than lasix  Advised not to take lasix but can do Torsemide 10 mg qd and must f/u with cardiology and stay hydrated with water try <1.5 L per day  Dr. Gayland Curry

## 2017-03-21 NOTE — Telephone Encounter (Signed)
Patient calling back She would really like a prescription of torsemide sent to pharmacy - she is running out of medication  Please call to discuss

## 2017-03-21 NOTE — Telephone Encounter (Signed)
Please advise if ok to refill. Torsemide is not on medication list.

## 2017-03-21 NOTE — Telephone Encounter (Signed)
To Dr. Rockey Situ to review for torsemide.

## 2017-03-21 NOTE — Telephone Encounter (Signed)
Please call pt speak with her and husband  Pick up Doxycycline Rx   she needs to f/u with cardiology about heart failure due to fluid in lungs and needs fluid management with cardiology She should not take both lasix and torsemide either one or the other  I Rx 40 mg Lasix but she needs to f/u with cardiology asap  If I refill Torsemide she must hold/stop lasix for now  -what does she want to do? Please call and make appt for cardiology and [place] if not better   I wanted to see her back 03/21/17 but do not see her on the schedule

## 2017-03-24 ENCOUNTER — Encounter: Payer: Self-pay | Admitting: Internal Medicine

## 2017-03-24 ENCOUNTER — Ambulatory Visit (INDEPENDENT_AMBULATORY_CARE_PROVIDER_SITE_OTHER): Payer: PPO | Admitting: Internal Medicine

## 2017-03-24 VITALS — BP 110/84 | HR 96 | Temp 97.5°F | Resp 18 | Wt 113.5 lb

## 2017-03-24 DIAGNOSIS — R609 Edema, unspecified: Secondary | ICD-10-CM

## 2017-03-24 DIAGNOSIS — J189 Pneumonia, unspecified organism: Secondary | ICD-10-CM

## 2017-03-24 DIAGNOSIS — R110 Nausea: Secondary | ICD-10-CM

## 2017-03-24 DIAGNOSIS — I5022 Chronic systolic (congestive) heart failure: Secondary | ICD-10-CM | POA: Diagnosis not present

## 2017-03-24 DIAGNOSIS — R1310 Dysphagia, unspecified: Secondary | ICD-10-CM

## 2017-03-24 DIAGNOSIS — F419 Anxiety disorder, unspecified: Secondary | ICD-10-CM | POA: Diagnosis not present

## 2017-03-24 MED ORDER — TORSEMIDE 10 MG PO TABS: 10.0000 mg | ORAL_TABLET | Freq: Every day | ORAL | 0 refills | Status: DC

## 2017-03-24 MED ORDER — POTASSIUM CHLORIDE ER 10 MEQ PO TBCR: EXTENDED_RELEASE_TABLET | ORAL | 0 refills | Status: DC

## 2017-03-24 NOTE — Telephone Encounter (Signed)
Patient has history of noncompliance with medications and has not been taking fluid pills as prescribed. Recommendations are for patient to be seen in [facility]F Clinic to help with fluid management and medication education but she refused to make appointment. Dr. Rockey Situ is aware and patient has scheduled follow up appointment with our office.

## 2017-03-24 NOTE — Telephone Encounter (Signed)
Ok to send in torsemide dose Would write for BID PRN

## 2017-03-24 NOTE — Patient Instructions (Addendum)
Please take torsemide daily x 1 more week then every other day until your next appt Schedule CXR 04/17/17 and f/u with me 1-2 days later  Take care  Please read information below   Dysphagia Dysphagia is trouble swallowing. This condition occurs when solids and liquids stick in a person's throat on the way down to the stomach, or when food takes longer to get to the stomach. You may have problems swallowing food, liquids, or both. You may also have pain while trying to swallow. It may take you more time and effort to swallow something. What are the causes? This condition is caused by:  Problems with the muscles. They may make it difficult for you to move food and liquids through the tube that connects your mouth to your stomach (esophagus). You may have ulcers, scar tissue, or inflammation that blocks the normal passage of food and liquids. Causes of these problems include: ? Acid reflux from your stomach into your esophagus (gastroesophageal reflux). ? Infections. ? Radiation treatment for cancer. ? Medicines taken without enough fluids to wash them down into your stomach.  Nerve problems. These prevent signals from being sent to the muscles of your esophagus to squeeze (contract) and move what you swallow down to your stomach.  Globus pharyngeus. This is a common problem that involves feeling like something is stuck in the throat or a sense of trouble with swallowing even though nothing is wrong with the swallowing passages.  Stroke. This can affect the nerves and make it difficult to swallow.  Certain conditions, such as cerebral palsy or Parkinson disease.  What are the signs or symptoms? Common symptoms of this condition include:  A feeling that solids or liquids are stuck in your throat on the way down to the stomach.  Food taking too long to get to the stomach.  Other symptoms include:  Food moving back from your stomach to your mouth (regurgitation).  Noises coming from  your throat.  Chest discomfort with swallowing.  A feeling of fullness when swallowing.  Drooling, especially when the throat is blocked.  Pain while swallowing.  Heartburn.  Coughing or gagging while trying to swallow.  How is this diagnosed? This condition is diagnosed by:  Barium X-ray. In this test, you swallow a white substance (contrast medium)that sticks to the inside of your esophagus. X-ray images are then taken.  Endoscopy. In this test, a flexible telescope is inserted down your throat to look at your esophagus and your stomach.  CT scans and MRI.  How is this treated? Treatment for dysphagia depends on the cause of the condition:  If the dysphagia is caused by acid reflux or infection, medicines may be used. They may include antibiotics and heartburn medicines.  If the dysphagia is caused by problems with your muscles, swallowing therapy may be used to help you strengthen your swallowing muscles. You may have to do specific exercises to strengthen the muscles or stretch them.  If the dysphagia is caused by a blockage or mass, procedures to remove the blockage may be done. You may need surgery and a feeding tube.  You may need to make diet changes. Ask your health care provider for specific instructions. Follow these instructions at home: Eating and drinking  Try to eat soft food that is easier to swallow.  Follow any diet changes as told by your health care provider.  Cut your food into small pieces and eat slowly.  Eat and drink only when you are sitting upright.  Do not drink alcohol or caffeine. If you need help quitting, ask your health care provider. General instructions  Check your weight every day to make sure you are not losing weight.  Take over-the-counter and prescription medicines only as told by your health care provider.  If you were prescribed an antibiotic medicine, take it as told by your health care provider. Do not stop taking the  antibiotic even if you start to feel better.  Do not use any products that contain nicotine or tobacco, such as cigarettes and e-cigarettes. If you need help quitting, ask your health care provider.  Keep all follow-up visits as told by your health care provider. This is important. Contact a health care provider if:  You lose weight because you cannot swallow.  You cough when you drink liquids (aspiration).  You cough up partially digested food. Get help right away if:  You cannot swallow your saliva.  You have shortness of breath or a fever, or both.  You have a hoarse voice and also have trouble swallowing. Summary  Dysphagia is trouble swallowing. This condition occurs when solids and liquids stick in a person's throat on the way down to the stomach, or when food takes longer to get to the stomach.  Dysphagia has many possible causes and symptoms.  Treatment for dysphagia depends on the cause of the condition. This information is not intended to replace advice given to you by your health care provider. Make sure you discuss any questions you have with your health care provider. Document Released: 02/16/2000 Document Revised: 02/08/2016 Document Reviewed: 02/08/2016 Elsevier Interactive Patient Education  2017 [place].  Heart Failure Heart failure is a condition in which the heart has trouble pumping blood because it has become weak or stiff. This means that the heart does not pump blood efficiently for the body to work well. For some people with heart failure, fluid may back up into the lungs and there may be swelling (edema) in the lower legs. Heart failure is usually a long-term (chronic) condition. It is important for you to take good care of yourself and follow the treatment plan from your health care provider. What are the causes? This condition is caused by some health problems, including:  High blood pressure (hypertension). Hypertension causes the heart muscle to  work harder than normal. High blood pressure eventually causes the heart to become stiff and weak.  Coronary artery disease (CAD). CAD is the buildup of cholesterol and fat (plaques) in the arteries of the heart.  Heart attack (myocardial infarction). Injured tissue, which is caused by the heart attack, does not contract as well and the heart's ability to pump blood is weakened.  Abnormal heart valves. When the heart valves do not open and close properly, the heart muscle must pump harder to keep the blood flowing.  Heart muscle disease (cardiomyopathy or myocarditis). Heart muscle disease is damage to the heart muscle from a variety of causes, such as drug or alcohol abuse, infections, or unknown causes. These can increase the risk of heart failure.  Lung disease. When the lungs do not work properly, the heart must work harder.  What increases the risk? Risk of heart failure increases as a person ages. This condition is also more likely to develop in people who:  Are overweight.  Are female.  Smoke or chew tobacco.  Abuse alcohol or illegal drugs.  Have taken medicines that can damage the heart, such as chemotherapy drugs.  Have diabetes. ? High blood  sugar (glucose) is associated with high fat (lipid) levels in the blood. ? Diabetes can also damage tiny blood vessels that carry nutrients to the heart muscle.  Have abnormal heart rhythms.  Have thyroid problems.  Have low blood counts (anemia).  What are the signs or symptoms? Symptoms of this condition include:  Shortness of breath with activity, such as when climbing stairs.  Persistent cough.  Swelling of the feet, ankles, legs, or abdomen.  Unexplained weight gain.  Difficulty breathing when lying flat (orthopnea).  Waking from sleep because of the need to sit up and get more air.  Rapid heartbeat.  Fatigue and loss of energy.  Feeling light-headed, dizzy, or close to fainting.  Loss of  appetite.  Nausea.  Increased urination during the night (nocturia).  Confusion.  How is this diagnosed? This condition is diagnosed based on:  Medical history, symptoms, and a physical exam.  Diagnostic tests, which may include: ? Echocardiogram. ? Electrocardiogram (ECG). ? Chest X-ray. ? Blood tests. ? Exercise stress test. ? Radionuclide scans. ? Cardiac catheterization and angiogram.  How is this treated? Treatment for this condition is aimed at managing the symptoms of heart failure. Medicines, behavioral changes, or other treatments may be necessary to treat heart failure. Medicines These may include:  Angiotensin-converting enzyme (ACE) inhibitors. This type of medicine blocks the effects of a blood protein called angiotensin-converting enzyme. ACE inhibitors relax (dilate) the blood vessels and help to lower blood pressure.  Angiotensin receptor blockers (ARBs). This type of medicine blocks the actions of a blood protein called angiotensin. ARBs dilate the blood vessels and help to lower blood pressure.  Water pills (diuretics). Diuretics cause the kidneys to remove salt and water from the blood. The extra fluid is removed through urination, leaving a lower volume of blood that the heart has to pump.  Beta blockers. These improve heart muscle strength and they prevent the heart from beating too quickly.  Digoxin. This increases the force of the heartbeat.  Healthy behavior changes These may include:  Reaching and maintaining a healthy weight.  Stopping smoking or chewing tobacco.  Eating heart-healthy foods.  Limiting or avoiding alcohol.  Stopping use of street drugs (illegal drugs).  Physical activity.  Other treatments These may include:  Surgery to open blocked coronary arteries or repair damaged heart valves.  Placement of a biventricular pacemaker to improve heart muscle function (cardiac resynchronization therapy). This device paces both the  right ventricle and left ventricle.  Placement of a device to treat serious abnormal heart rhythms (implantable cardioverter defibrillator, or ICD).  Placement of a device to improve the pumping ability of the heart (left ventricular assist device, or LVAD).  Heart transplant. This can cure heart failure, and it is considered for certain patients who do not improve with other therapies.  Follow these instructions at home: Medicines  Take over-the-counter and prescription medicines only as told by your health care provider. Medicines are important in reducing the workload of your heart, slowing the progression of heart failure, and improving your symptoms. ? Do not stop taking your medicine unless your health care provider told you to do that. ? Do not skip any dose of medicine. ? Refill your prescriptions before you run out of medicine. You need your medicines every day. Eating and drinking   Eat heart-healthy foods. Talk with a dietitian to make an eating plan that is right for you. ? Choose foods that contain no trans fat and are low in saturated fat and  cholesterol. Healthy choices include fresh or frozen fruits and vegetables, fish, lean meats, legumes, fat-free or low-fat dairy products, and whole-grain or high-fiber foods. ? Limit salt (sodium) if directed by your health care provider. Sodium restriction may reduce symptoms of heart failure. Ask a dietitian to recommend heart-healthy seasonings. ? Use healthy cooking methods instead of frying. Healthy methods include roasting, grilling, broiling, baking, poaching, steaming, and stir-frying.  Limit your fluid intake if directed by your health care provider. Fluid restriction may reduce symptoms of heart failure. Lifestyle  Stop smoking or using chewing tobacco. Nicotine and tobacco can damage your heart and your blood vessels. Do not use nicotine gum or patches before talking to your health care provider.  Limit alcohol intake to no  more than 1 drink per day for non-pregnant women and 2 drinks per day for men. One drink equals 12 oz of beer, 5 oz of wine, or 1 oz of hard liquor. ? Drinking more than that is harmful to your heart. Tell your health care provider if you drink alcohol several times a week. ? Talk with your health care provider about whether any level of alcohol use is safe for you. ? If your heart has already been damaged by alcohol or you have severe heart failure, drinking alcohol should be stopped completely.  Stop use of illegal drugs.  Lose weight if directed by your health care provider. Weight loss may reduce symptoms of heart failure.  Do moderate physical activity if directed by your health care provider. People who are elderly and people with severe heart failure should consult with a health care provider for physical activity recommendations. Monitor important information  Weigh yourself every day. Keeping track of your weight daily helps you to notice excess fluid sooner. ? Weigh yourself every morning after you urinate and before you eat breakfast. ? Wear the same amount of clothing each time you weigh yourself. ? Record your daily weight. Provide your health care provider with your weight record.  Monitor and record your blood pressure as told by your health care provider.  Check your pulse as told by your health care provider. Dealing with extreme temperatures  If the weather is extremely hot: ? Avoid vigorous physical activity. ? Use air conditioning or fans or seek a cooler location. ? Avoid caffeine and alcohol. ? Wear loose-fitting, lightweight, and light-colored clothing.  If the weather is extremely cold: ? Avoid vigorous physical activity. ? Layer your clothes. ? Wear mittens or gloves, a hat, and a scarf when you go outside. ? Avoid alcohol. General instructions  Manage other health conditions such as hypertension, diabetes, thyroid disease, or abnormal heart rhythms as  told by your health care provider.  Learn to manage stress. If you need help to do this, ask your health care provider.  Plan rest periods when fatigued.  Get ongoing education and support as needed.  Participate in or seek rehabilitation as needed to maintain or improve independence and quality of life.  Stay up to date with immunizations. Keeping current on pneumococcal and influenza immunizations is especially important to prevent respiratory infections.  Keep all follow-up visits as told by your health care provider. This is important. Contact a health care provider if:  You have a rapid weight gain.  You have increasing shortness of breath that is unusual for you.  You are unable to participate in your usual physical activities.  You tire easily.  You cough more than normal, especially with physical activity.  You  have any swelling or more swelling in areas such as your hands, feet, ankles, or abdomen.  You are unable to sleep because it is hard to breathe.  You feel like your heart is beating quickly (palpitations).  You become dizzy or light-headed when you stand up. Get help right away if:  You have difficulty breathing.  You notice or your family notices a change in your awareness, such as having trouble staying awake or having difficulty with concentration.  You have pain or discomfort in your chest.  You have an episode of fainting (syncope). This information is not intended to replace advice given to you by your health care provider. Make sure you discuss any questions you have with your health care provider. Document Released: 02/18/2005 Document Revised: 10/24/2015 Document Reviewed: 09/13/2015 Elsevier Interactive Patient Education  [employer].

## 2017-03-25 MED ORDER — TORSEMIDE 10 MG PO TABS: 10.0000 mg | ORAL_TABLET | Freq: Two times a day (BID) | ORAL | 3 refills | Status: DC | PRN

## 2017-03-25 NOTE — Telephone Encounter (Signed)
Spoke with patient and confirmed pharmacy of choice. Prescription sent in for medication and reviewed instructions with patient to take as needed. She verbalized understanding of our conversation, agreement with plan, and had no further questions at this time.

## 2017-03-26 ENCOUNTER — Other Ambulatory Visit: Payer: Self-pay | Admitting: Internal Medicine

## 2017-03-26 ENCOUNTER — Telehealth: Payer: Self-pay | Admitting: Internal Medicine

## 2017-03-26 DIAGNOSIS — R112 Nausea with vomiting, unspecified: Secondary | ICD-10-CM

## 2017-03-26 MED ORDER — ONDANSETRON 4 MG PO TBDP: ORAL_TABLET | ORAL | 0 refills | Status: DC

## 2017-03-26 NOTE — Telephone Encounter (Signed)
Copied from [place]. Topic: General - Other >> Mar 26, 2017 11:12 AM Cecelia Byars, NT wrote: Reason for CRM: Patient called to check on status of request for a prescription for zofran  per her conversation with Dr Aundra Dubin Jacklynn Lewis during her visit on 1/21/ 19

## 2017-03-26 NOTE — Telephone Encounter (Signed)
Last office visit 03/24/17 Next office visit 04/21/17

## 2017-03-27 ENCOUNTER — Encounter: Payer: Self-pay | Admitting: Internal Medicine

## 2017-03-27 DIAGNOSIS — R1310 Dysphagia, unspecified: Secondary | ICD-10-CM | POA: Insufficient documentation

## 2017-03-27 NOTE — Progress Notes (Signed)
Chief Complaint  Patient presents with  . Follow-up   F/u with husband 1. She is feeling better as far as anxiety and breathing wt down 5 lbs since on torsemide 10 mg qd since a few days since last visit. She did not think lasix 40 mg helped with fluid balance  2. Reviewed labs and CXR. Pt declines CTA chest to w/u elevated D dimer 0.69. Also there was c/w right upper lung pneumonia Rx doxycycline and will need repeat CXR in 4-6 weeks  3. C/o foods getting stuck and chest pressure. Softer foods like potatoes ok but breads, meat and brunswick stew problem. She does not have a problem with liquids. At times she has to stop eating b/c food gets stuck and she will feel bauseated 4. Acute on chronic sCHF improved sob and leg edema on Torsemide 10 mg qd. Wt down 5 lbs, less anxiety    Review of Systems  Constitutional: Positive for weight loss.       Down 5 lbs   HENT:       +dysphagia   Respiratory: Negative for shortness of breath.   Cardiovascular:       +chest pressure  Leg swelling improved   Skin: Negative for rash.   Past Medical History:  Diagnosis Date  . Anxiety   . Arthritis   . CAD (coronary artery disease)    a. cardiac cath 10/2014: ostial LAD 30%, prox LAD 90% s/p PCI/DES, ostial RCA 30%, mid RCA 90% s/p PCI/DES, ostial Ramus 50%  . Chronic back pain   . Chronic fatigue   . Chronic systolic CHF (congestive heart failure) ([place])    a. echo 10/2014: EF 20%, AK inf, inf-sep, distal ant, & apical walls, HK elsewhere. Cavity size mildly dilated. Wall thickness nl. mild AI. Mild to mod MR. LA mildly dilated. RV sys fxn mod to severely reduced. small pericardial effusion was ID'd.   . Hepatitis C   . Ischemic cardiomyopathy   . Osteoporosis   . Systolic CHF, chronic ([place])   . UTI (lower urinary tract infection)    Past Surgical History:  Procedure Laterality Date  . BACK SURGERY  07/14   T10, L1  . CARDIAC CATHETERIZATION Bilateral 10/27/2014   Procedure: Left Heart Cath and  Coronary Angiography;  Surgeon: Minna Merritts, MD;  Location: [facility];  Service: Cardiovascular;  Laterality: Bilateral;  . CARDIAC CATHETERIZATION N/A 10/27/2014   Procedure: Coronary Stent Intervention;  Surgeon: Wellington Hampshire, MD;  Location: [facility];  Service: Cardiovascular;  Laterality: N/A;   Family History  Problem Relation Age of Onset  . Arthritis Mother   . Heart disease Father        passed at 29  . Diabetes Father    Social History   Socioeconomic History  . Marital status: Married    Spouse name: Kellin Fifer  . Number of children: 0  . Years of education: 49  . Highest education level: Not on file  Social Needs  . Financial resource strain: Not on file  . Food insecurity - worry: Not on file  . Food insecurity - inability: Not on file  . Transportation needs - medical: Not on file  . Transportation needs - non-medical: Not on file  Occupational History  . Occupation: Receptionist    Comment: Retired in [place] Use  . Smoking status: Former Smoker    Packs/day: 1.00    Years: 10.00    Pack years:  10.00  . Smokeless tobacco: Never Used  Substance and Sexual Activity  . Alcohol use: No    Alcohol/week: 0.0 oz  . Drug use: No  . Sexual activity: Yes    Birth control/protection: Post-menopausal  Other Topics Concern  . Not on file  Social History Narrative   Pt is 77 yo female, married to husband of 21 years.  Pt has no children.  Pt lives with husband and teacup poodle, [name]. Pt states she hurt her back last year and has not been able to participate in much physical activity since. Pt enjoys traveling with her husband in motor home, doing crossword puzzles with husband, and playing cards with friends.  Pt was raised in [facility] and has lived in [place] entire adult life.  Pt worked in [employer] in [place] until [place] when they closed.     Current Meds  Medication Sig  . aspirin EC 81 MG tablet Take 81 mg by mouth  daily.  Marland Kitchen atorvastatin (LIPITOR) 80 MG tablet TAKE 1 TABLET (80 MG TOTAL) BY MOUTH DAILY AT 6 PM.  . Calcium-Vitamin A-Vitamin D (LIQUID CALCIUM PO) Take by mouth. 1 tablespoon daily  . cholecalciferol (VITAMIN D) 1000 UNITS tablet Take 1,000 Units by mouth daily.  . citalopram (CELEXA) 10 MG tablet Take 1 tablet (10 mg total) by mouth daily.  . clonazePAM (KLONOPIN) 0.5 MG tablet Qd to bid prn for anxiety  . clopidogrel (PLAVIX) 75 MG tablet Take 1 tablet (75 mg total) by mouth daily.  . clopidogrel (PLAVIX) 75 MG tablet Take 1 tablet (75 mg total) by mouth daily.  Marland Kitchen denosumab (PROLIA) 60 MG/ML SOLN injection Inject 60 mg into the skin every 6 (six) months.  . Melatonin 3 MG TABS Take by mouth as needed.  . metoprolol succinate (TOPROL-XL) 25 MG 24 hr tablet TAKE 1 TABLET (25 MG TOTAL) BY MOUTH AT BEDTIME.  . Multiple Vitamins-Minerals (HAIR/SKIN/NAILS/BIOTIN PO) Take by mouth.  . potassium chloride (KLOR-CON 10) 10 MEQ tablet TAKE 1 TABLET (10 MEQ TOTAL) BY MOUTH EVERY OTHER DAY.  . sacubitril-valsartan (ENTRESTO) 24-26 MG Take 1 tablet by mouth 2 (two) times daily.  . [DISCONTINUED] KLOR-CON 10 10 MEQ tablet TAKE 1 TABLET (10 MEQ TOTAL) BY MOUTH EVERY OTHER DAY.  . [DISCONTINUED] ondansetron (ZOFRAN-ODT) 4 MG disintegrating tablet TAKE 1 TABLET BY MOUTH TWICE A DAY AS NEEDED FOR NAUSEA AND VOMITING  . [DISCONTINUED] torsemide (DEMADEX) 10 MG tablet Take 1 tablet (10 mg total) by mouth daily. In am. Do NOT take with Lasix.  . [DISCONTINUED] torsemide (DEMADEX) 10 MG tablet Take 1 tablet (10 mg total) by mouth daily. in am daily x 1 week then every other day   Allergies  Allergen Reactions  . Sugar-Protein-Starch Nausea And Vomiting    Only to sugar   Recent Results (from the past 2160 hour(s))  D-Dimer, Quantitative     Status: Abnormal   Collection Time: 03/17/17  4:57 PM  Result Value Ref Range   D-Dimer, Quant 0.69 (H) <0.50 mcg/mL FEU    Comment: . The D-Dimer test is used  frequently to exclude an acute PE or DVT. In patients with a low to moderate clinical risk assessment and a D-Dimer result <0.50 mcg/mL FEU, the likelihood of a PE or DVT is very low. However, a thromboembolic event should not be excluded solely on the basis of the D-Dimer level. Increased levels of D-Dimer are associated with a PE, DVT, DIC, malignancies, inflammation, sepsis, surgery, trauma,  pregnancy, and advancing patient age. [Jama 2006 11:295(2):199-207] . For additional information, please refer to: http://education.questdiagnostics.com/faq/FAQ149 (This link is being provided for informational/ educational purposes only) .   Troponin I     Status: None   Collection Time: 03/17/17  4:57 PM  Result Value Ref Range   Troponin I 0.04 < OR = 0.0 ng/mL    Comment: . In accord with published recommendations, serial testing of troponin I at intervals of 2 to 4 hours for up to 12 to 24 hours is suggested in order to corroborate a single troponin I result. An elevated troponin alone is not sufficient to make the diagnosis of MI. .   Basic Metabolic Panel (BMET)     Status: Abnormal   Collection Time: 03/17/17  4:57 PM  Result Value Ref Range   Glucose, Bld 128 (H) 65 - 99 mg/dL    Comment: .            Fasting reference interval . For someone without known diabetes, a glucose value >125 mg/dL indicates that they may have diabetes and this should be confirmed with a follow-up test. .    BUN 32 (H) 7 - 25 mg/dL   Creat 1.44 (H) 0.60 - 0.93 mg/dL    Comment: For patients >3 years of age, the reference limit for Creatinine is approximately 13% higher for people identified as African-American. .    BUN/Creatinine Ratio 22 6 - 22 (calc)   Sodium 145 135 - 146 mmol/L   Potassium 5.0 3.5 - 5.3 mmol/L   Chloride 109 98 - 110 mmol/L   CO2 15 (L) 20 - 32 mmol/L    Comment: Analysis performed on aliquoted specimen, CO2 may be decreased due to greater exposure of specimen  to air.    Calcium 9.4 8.6 - 10.4 mg/dL  TIQ-NTM     Status: None   Collection Time: 03/17/17  4:57 PM  Result Value Ref Range   QUESTION/PROBLEM:      Comment: . No test(s) are indicated on the requisition for the following specimen(s). .    SPECIMEN(S) RECEIVED: XS     Comment: To prevent further delays in testing, please contact us immediately at [phone]-[facility] [phone] order to  resolve this questionable order. Fax completed form to [phone].    Objective  Body mass index is 22.17 kg/m. Wt Readings from Last 3 Encounters:  03/24/17 113 lb 8 oz (51.5 kg)  03/17/17 118 lb (53.5 kg)  11/18/16 112 lb 6.4 oz (51 kg)   Temp Readings from Last 3 Encounters:  03/24/17 (!) 97.5 F (36.4 C) (Oral)  03/17/17 (!) 97.5 F (36.4 C) (Oral)  11/18/16 98.3 F (36.8 C) (Oral)   BP Readings from Last 3 Encounters:  03/24/17 110/84  03/17/17 110/74  11/18/16 (!) 116/58   Pulse Readings from Last 3 Encounters:  03/24/17 96  03/17/17 (!) 102  11/18/16 75   O2 sat room air 935   Physical Exam  Constitutional: She is oriented to person, place, and time and well-developed, well-nourished, and in no distress. Vital signs are normal.  HENT:  Head: Normocephalic and atraumatic.  Mouth/Throat: Oropharynx is clear and moist and mucous membranes are normal.  Eyes: Conjunctivae are normal. Pupils are equal, round, and reactive to light.  Cardiovascular: Normal rate, regular rhythm and normal heart sounds.  Improved leg edema to b/l legs 1+ b/l   Pulmonary/Chest: Effort normal and breath sounds normal.  Neurological: She is alert and oriented to person, place,  and time. Gait normal.  Skin: Skin is warm and dry.  Psychiatric: Mood, memory, affect and judgment normal.  Nursing note and vitals reviewed.   Assessment   1. Chronic systolic CHF with moderate pleural effusions noted on CXR stable, leg edema improved  2. Elevated D dimer 0.69  3. CXR c/w pneumonia right upper  lobe  4. Dysphagia and nausea  5. HM 6. Anxiety improved  Plan  1.  Cont torsemide 10 mg qd x 1 more week then every other day until next appt Will need to repeat BMET at f/u  2.pt declines CTA chest and further w/u  3. Repeat CXR 04/17/17  Prev Rx doxycycline bid  4. Consider barium swallow to w/u  Will ask pt about GERD sx's and tx if  Needed with PPI  5.  Had flu, prevnar, pna 23 due to be updated  Consider shingrix and Tdap if has not had   Consider fobt vs cologuard in the future  mammo consider referral in future none on file  Pap out of age window  DEXA 01/31/16 osteoporosis + consider check vit D in future. On Prolia. Will need calcium and vit D supplementation in future.    6. She is cutting klonopin 0.5 in 1/2   Provider: Dr. Olivia Mackie McLean-Scocuzza-Internal Medicine

## 2017-03-28 ENCOUNTER — Telehealth: Payer: Self-pay

## 2017-03-28 NOTE — Telephone Encounter (Signed)
Left voice mail to call back ok for [facility] to speak to patient, Is patient having heartburn symptoms?  would patient be agreeable to taking heartburn medicine

## 2017-03-28 NOTE — Telephone Encounter (Signed)
-----   Message from Delorise Jackson, MD sent at 03/27/2017 12:51 PM EST ----- Please call pt is she having heartburn sx's also  If so would be agreeable to try heartburn medication as heartburn can cause trouble swallowing   TMS

## 2017-03-31 ENCOUNTER — Other Ambulatory Visit: Payer: Self-pay

## 2017-03-31 NOTE — Telephone Encounter (Signed)
Attempted to contact pt regarding message per Dr McLean-Scocuzza; left message on voicemail [phone].

## 2017-03-31 NOTE — Telephone Encounter (Signed)
Requesting 90 day supply Next office visit 04/21/17

## 2017-04-01 ENCOUNTER — Other Ambulatory Visit: Payer: Self-pay | Admitting: Internal Medicine

## 2017-04-01 DIAGNOSIS — R600 Localized edema: Secondary | ICD-10-CM

## 2017-04-01 MED ORDER — TORSEMIDE 10 MG PO TABS: 10.0000 mg | ORAL_TABLET | Freq: Two times a day (BID) | ORAL | 3 refills | Status: DC | PRN

## 2017-04-01 MED ORDER — TORSEMIDE 10 MG PO TABS: 10.0000 mg | ORAL_TABLET | ORAL | 1 refills | Status: DC

## 2017-04-01 NOTE — Telephone Encounter (Signed)
Pt states she needs a refill for torsemide (DEMADEX) 10 MG tablet   Pharmacy is [id number] [address], [place]  Call pt @ [phone]. Thank you!

## 2017-04-02 NOTE — Telephone Encounter (Signed)
Refill sent 04/01/17

## 2017-04-17 ENCOUNTER — Ambulatory Visit (INDEPENDENT_AMBULATORY_CARE_PROVIDER_SITE_OTHER): Payer: PPO

## 2017-04-17 DIAGNOSIS — J189 Pneumonia, unspecified organism: Secondary | ICD-10-CM | POA: Diagnosis not present

## 2017-04-21 ENCOUNTER — Ambulatory Visit: Payer: PPO | Admitting: Internal Medicine

## 2017-04-28 ENCOUNTER — Ambulatory Visit: Payer: PPO | Admitting: Internal Medicine

## 2017-05-06 ENCOUNTER — Ambulatory Visit (INDEPENDENT_AMBULATORY_CARE_PROVIDER_SITE_OTHER): Payer: PPO | Admitting: *Deleted

## 2017-05-06 DIAGNOSIS — M810 Age-related osteoporosis without current pathological fracture: Secondary | ICD-10-CM | POA: Diagnosis not present

## 2017-05-06 MED ORDER — DENOSUMAB 60 MG/ML ~~LOC~~ SOLN
60.0000 mg | Freq: Once | SUBCUTANEOUS | Status: AC
  Administered 2017-05-06: 60 mg via SUBCUTANEOUS

## 2017-05-06 NOTE — Progress Notes (Addendum)
Patient presented for Prolia injection to Right arm [place], patient voiced no concerns or complaints during or after injection.  Reviewed.  Dr Nicki Reaper

## 2017-05-11 ENCOUNTER — Other Ambulatory Visit: Payer: Self-pay | Admitting: Cardiovascular Disease

## 2017-05-11 DIAGNOSIS — I5022 Chronic systolic (congestive) heart failure: Secondary | ICD-10-CM

## 2017-05-14 ENCOUNTER — Encounter: Payer: Self-pay | Admitting: Internal Medicine

## 2017-05-14 ENCOUNTER — Ambulatory Visit (INDEPENDENT_AMBULATORY_CARE_PROVIDER_SITE_OTHER): Payer: PPO | Admitting: Internal Medicine

## 2017-05-14 VITALS — BP 96/58 | HR 69 | Temp 97.6°F | Ht 60.0 in | Wt 110.2 lb

## 2017-05-14 DIAGNOSIS — R7989 Other specified abnormal findings of blood chemistry: Secondary | ICD-10-CM | POA: Diagnosis not present

## 2017-05-14 DIAGNOSIS — J310 Chronic rhinitis: Secondary | ICD-10-CM | POA: Diagnosis not present

## 2017-05-14 DIAGNOSIS — I5022 Chronic systolic (congestive) heart failure: Secondary | ICD-10-CM | POA: Diagnosis not present

## 2017-05-14 DIAGNOSIS — R1310 Dysphagia, unspecified: Secondary | ICD-10-CM

## 2017-05-14 DIAGNOSIS — Z23 Encounter for immunization: Secondary | ICD-10-CM | POA: Diagnosis not present

## 2017-05-14 DIAGNOSIS — E559 Vitamin D deficiency, unspecified: Secondary | ICD-10-CM

## 2017-05-14 DIAGNOSIS — R42 Dizziness and giddiness: Secondary | ICD-10-CM | POA: Diagnosis not present

## 2017-05-14 DIAGNOSIS — F419 Anxiety disorder, unspecified: Secondary | ICD-10-CM

## 2017-05-14 DIAGNOSIS — J449 Chronic obstructive pulmonary disease, unspecified: Secondary | ICD-10-CM | POA: Insufficient documentation

## 2017-05-14 LAB — BASIC METABOLIC PANEL
BUN: 28 mg/dL — AB (ref 6–23)
CO2: 29 mEq/L (ref 19–32)
CREATININE: 1.31 mg/dL — AB (ref 0.40–1.20)
Calcium: 9.8 mg/dL (ref 8.4–10.5)
Chloride: 102 mEq/L (ref 96–112)
GFR: 42 mL/min — AB (ref >60.00)
GLUCOSE: 99 mg/dL (ref 70–99)
Potassium: 5.1 mEq/L (ref 3.5–5.1)
Sodium: 139 mEq/L (ref 135–145)

## 2017-05-14 LAB — VITAMIN D 25 HYDROXY (VIT D DEFICIENCY, FRACTURES): VITD: 43.38 ng/mL (ref 30.00–100.00)

## 2017-05-14 MED ORDER — ALPRAZOLAM 0.25 MG PO TABS
ORAL_TABLET | ORAL | 0 refills | Status: DC
Start: 2017-05-14 — End: 2019-01-01

## 2017-05-14 MED ORDER — IPRATROPIUM BROMIDE 0.06 % NA SOLN: 2.0000 | Freq: Three times a day (TID) | NASAL | 12 refills | Status: DC

## 2017-05-14 MED ORDER — ALBUTEROL SULFATE HFA 108 (90 BASE) MCG/ACT IN AERS: 2.0000 | INHALATION_SPRAY | Freq: Four times a day (QID) | RESPIRATORY_TRACT | 0 refills | Status: DC | PRN

## 2017-05-14 NOTE — Progress Notes (Signed)
Pre visit review using our clinic review tool, if applicable. No additional management support is needed unless otherwise documented below in the visit note. 

## 2017-05-14 NOTE — Progress Notes (Signed)
Chief Complaint  Patient presents with  . Follow-up   F/u with husband 1. C/o SOB with exertion with using bathroom and night she wonders if could be anxiety vs other h/o CHF last echo 20-25% DD 01/2015 vs CXR 2/14 with + COPD she is a former smoker  2. C/o runny nose x 2.5 years since been on Torsemide/fluid pills. She is taking Benadryl sometimes 5 pills a day and loratidine. 3. Dizziness at times with walking to the bathroom at night advised pt to cut back on benadryl. She also thought dizziness may be related to 1/2 dose of klonopin 4. C/o trouble with food going down worse with fried foods and cold drinks. Declines further w/u of this today  5. C/o throat clearing and PND, denies GERD sx's.     Review of Systems  Constitutional: Negative for weight loss.  HENT: Negative for hearing loss.        +dysphagia  +postnasal drip   Eyes: Negative for blurred vision.  Respiratory: Positive for shortness of breath.   Cardiovascular: Negative for chest pain.  Gastrointestinal: Positive for nausea. Negative for abdominal pain and heartburn.  Skin: Negative for rash.       Left heel with resolving blister   Neurological: Positive for dizziness.  Psychiatric/Behavioral: The patient is nervous/anxious.    Past Medical History:  Diagnosis Date  . Anxiety   . Arthritis   . CAD (coronary artery disease)    a. cardiac cath 10/2014: ostial LAD 30%, prox LAD 90% s/p PCI/DES, ostial RCA 30%, mid RCA 90% s/p PCI/DES, ostial Ramus 50%  . Chronic back pain   . Chronic fatigue   . Chronic systolic CHF (congestive heart failure) ([place])    a. echo 10/2014: EF 20%, AK inf, inf-sep, distal ant, & apical walls, HK elsewhere. Cavity size mildly dilated. Wall thickness nl. mild AI. Mild to mod MR. LA mildly dilated. RV sys fxn mod to severely reduced. small pericardial effusion was ID'd.   . Hepatitis C   . Ischemic cardiomyopathy   . Osteoporosis   . Systolic CHF, chronic ([place])   . UTI (lower urinary tract  infection)    Past Surgical History:  Procedure Laterality Date  . BACK SURGERY  07/14   T10, L1  . CARDIAC CATHETERIZATION Bilateral 10/27/2014   Procedure: Left Heart Cath and Coronary Angiography;  Surgeon: Minna Merritts, MD;  Location: [facility];  Service: Cardiovascular;  Laterality: Bilateral;  . CARDIAC CATHETERIZATION N/A 10/27/2014   Procedure: Coronary Stent Intervention;  Surgeon: Wellington Hampshire, MD;  Location: [facility];  Service: Cardiovascular;  Laterality: N/A;   Family History  Problem Relation Age of Onset  . Arthritis Mother   . Heart disease Father        passed at 11  . Diabetes Father    Social History   Socioeconomic History  . Marital status: Married    Spouse name: Kimyatta Lecy  . Number of children: 0  . Years of education: 41  . Highest education level: Not on file  Social Needs  . Financial resource strain: Not on file  . Food insecurity - worry: Not on file  . Food insecurity - inability: Not on file  . Transportation needs - medical: Not on file  . Transportation needs - non-medical: Not on file  Occupational History  . 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 Smoking status: Former Smoker  Packs/day: 1.00    Years: 10.00    Pack years: 10.00  . Smokeless tobacco: Never Used  Substance and Sexual Activity  . Alcohol use: No    Alcohol/week: 0.0 oz  . Drug use: No  . Sexual activity: Yes    Birth control/protection: Post-menopausal  Other Topics Concern  . Not on file  Social History Narrative   Pt is 77 yo female, married to husband of 21 years.  Pt has no children.  Pt lives with husband and teacup poodle, [name]. Pt states she hurt her back last year and has not been able to participate in much physical activity since. Pt enjoys traveling with her husband in motor home, doing crossword puzzles with husband, and playing cards with friends.  Pt was raised in [facility] and has lived in  [place] entire adult life.  Pt worked in [employer] in [place] until [place] when they closed.     Current Meds  Medication Sig  . aspirin EC 81 MG tablet Take 81 mg by mouth daily.  Marland Kitchen atorvastatin (LIPITOR) 80 MG tablet TAKE 1 TABLET (80 MG TOTAL) BY MOUTH DAILY AT 6 PM.  . Calcium-Vitamin A-Vitamin D (LIQUID CALCIUM PO) Take by mouth. 1 tablespoon daily  . cholecalciferol (VITAMIN D) 1000 UNITS tablet Take 1,000 Units by mouth daily.  . clopidogrel (PLAVIX) 75 MG tablet Take 1 tablet (75 mg total) by mouth daily.  Marland Kitchen denosumab (PROLIA) 60 MG/ML SOLN injection Inject 60 mg into the skin every 6 (six) months.  . ENTRESTO 24-26 MG TAKE 1 TABLET BY MOUTH TWICE A DAY  . Melatonin 3 MG TABS Take by mouth as needed.  . metoprolol succinate (TOPROL-XL) 25 MG 24 hr tablet TAKE 1 TABLET (25 MG TOTAL) BY MOUTH AT BEDTIME.  . Multiple Vitamins-Minerals (HAIR/SKIN/NAILS/BIOTIN PO) Take by mouth.  . ondansetron (ZOFRAN-ODT) 4 MG disintegrating tablet TAKE 1 TABLET BY MOUTH TWICE A DAY AS NEEDED FOR NAUSEA AND VOMITING  . potassium chloride (KLOR-CON 10) 10 MEQ tablet TAKE 1 TABLET (10 MEQ TOTAL) BY MOUTH EVERY OTHER DAY.  Marland Kitchen torsemide (DEMADEX) 10 MG tablet Take 1 tablet (10 mg total) by mouth every other day.  . [DISCONTINUED] citalopram (CELEXA) 10 MG tablet Take 1 tablet (10 mg total) by mouth daily.  . [DISCONTINUED] clonazePAM (KLONOPIN) 0.5 MG tablet Qd to bid prn for anxiety  . [DISCONTINUED] doxycycline (VIBRA-TABS) 100 MG tablet Take 1 tablet (100 mg total) by mouth 2 (two) times daily.   Allergies  Allergen Reactions  . Sugar-Protein-Starch Nausea And Vomiting    Only to sugar   Recent Results (from the past 2160 hour(s))  D-Dimer, Quantitative     Status: Abnormal   Collection Time: 03/17/17  4:57 PM  Result Value Ref Range   D-Dimer, Quant 0.69 (H) <0.50 mcg/mL FEU    Comment: . The D-Dimer test is used frequently to exclude an acute PE or DVT. In patients with a low  to moderate clinical risk assessment and a D-Dimer result <0.50 mcg/mL FEU, the likelihood of a PE or DVT is very low. However, a thromboembolic event should not be excluded solely on the basis of the D-Dimer level. Increased levels of D-Dimer are associated with a PE, DVT, DIC, malignancies, inflammation, sepsis, surgery, trauma, pregnancy, and advancing patient age. [Jama 2006 11:295(2):199-207] . For additional information, please refer to: http://education.questdiagnostics.com/faq/FAQ149 (This link is being provided for informational/ educational purposes only) .   Troponin I     Status: None  Collection Time: 03/17/17  4:57 PM  Result Value Ref Range   Troponin I 0.04 < OR = 0.0 ng/mL    Comment: . In accord with published recommendations, serial testing of troponin I at intervals of 2 to 4 hours for up to 12 to 24 hours is suggested in order to corroborate a single troponin I result. An elevated troponin alone is not sufficient to make the diagnosis of MI. .   Basic Metabolic Panel (BMET)     Status: Abnormal   Collection Time: 03/17/17  4:57 PM  Result Value Ref Range   Glucose, Bld 128 (H) 65 - 99 mg/dL    Comment: .            Fasting reference interval . For someone without known diabetes, a glucose value >125 mg/dL indicates that they may have diabetes and this should be confirmed with a follow-up test. .    BUN 32 (H) 7 - 25 mg/dL   Creat 1.44 (H) 0.60 - 0.93 mg/dL    Comment: For patients >38 years of age, the reference limit for Creatinine is approximately 13% higher for people identified as African-American. .    BUN/Creatinine Ratio 22 6 - 22 (calc)   Sodium 145 135 - 146 mmol/L   Potassium 5.0 3.5 - 5.3 mmol/L   Chloride 109 98 - 110 mmol/L   CO2 15 (L) 20 - 32 mmol/L    Comment: Analysis performed on aliquoted specimen, CO2 may be decreased due to greater exposure of specimen to air.    Calcium 9.4 8.6 - 10.4 mg/dL  TIQ-NTM     Status:  None   Collection Time: 03/17/17  4:57 PM  Result Value Ref Range   QUESTION/PROBLEM:      Comment: . No test(s) are indicated on the requisition for the following specimen(s). .    SPECIMEN(S) RECEIVED: XS     Comment: To prevent further delays in testing, please contact us immediately at [phone]-[facility] [phone] order to  resolve this questionable order. Fax completed form to [phone].    Objective  Body mass index is 21.52 kg/m. Wt Readings from Last 3 Encounters:  05/14/17 110 lb 3.2 oz (50 kg)  03/24/17 113 lb 8 oz (51.5 kg)  03/17/17 118 lb (53.5 kg)   Temp Readings from Last 3 Encounters:  05/14/17 97.6 F (36.4 C) (Oral)  03/24/17 (!) 97.5 F (36.4 C) (Oral)  03/17/17 (!) 97.5 F (36.4 C) (Oral)   BP Readings from Last 3 Encounters:  05/14/17 (!) 96/58  03/24/17 110/84  03/17/17 110/74   Pulse Readings from Last 3 Encounters:  05/14/17 69  03/24/17 96  03/17/17 (!) 102   O2 sat 97% room air  Physical Exam  Constitutional: She is oriented to person, place, and time and well-developed, well-nourished, and in no distress.  HENT:  Head: Normocephalic and atraumatic.  Mouth/Throat: Oropharynx is clear and moist and mucous membranes are normal.  Eyes: Conjunctivae are normal. Pupils are equal, round, and reactive to light.  Cardiovascular: Normal rate and regular rhythm.  Murmur heard. Pulmonary/Chest: Effort normal. She has wheezes.  Mild wheezing left lung field  Neurological: She is alert and oriented to person, place, and time. Gait normal. Gait normal.  BL walks with cane   Skin: Skin is warm and dry.     Left heel with peeling appears like resolving blister   Psychiatric: Mood, memory, affect and judgment normal.  Nursing note and vitals reviewed.   Assessment  1. SOB ddx COPD vs chronic combined heart failure vs anxiety  2. Anxiety  3. Dysphagia ddx GERD (though pt declines GERD sxs), esophageal spasm, stricture vs other  4.  Dizziness  5. Rhinitis per pt 2/2 duiretics reviewed this could be <3% frequency, postnasal drip  6. HM  7. Elevated Cr. 03/2017  Plan  1.  appt with Dr. Rockey Situ upcoming disc with pt and rec repeat echo she will disc with him  Trial of albuterol prn, disc pfts w/u pt declines Torsemide 10 mg qod seems to be helping with fluid balance. On entresto as well  2. Stop klonopon 0.5 mg 1/2 dose prn pt thinks possibly making dizzy Not taking Celexa 10 mg qd and does not want to  Trial of xanax 0.25 qd to bid prn  3.  Disc Barium swallow declines vs GI declines  4.  Monitor advised pt to stop taking 5 benadryl per day for #5 with loratidine  5. Loratidine qhs, stop benadryl  Trial of atrovent nasal, flonase and Azestaline did not help  6.  Had flu shot  pna 23 updated today  Had prevnar Declines Tdap  Consider shingrix in future   Consider fobt vs cologuard in the future  mammo consider referral in future none on file  Pap out of age window  DEXA 01/31/16 osteoporosis + consider check vit D in future try to add on today. On Prolia. Will need to disc calcium and vit D supplementation in future make sure dose is adequate  7. Repeat BMET today   "I spent 30 minutes face-to face with patient with greater than 50% of time spent counseling and/or in coordination of care disc with patient and husband about sx's and tx plan  Provider: Dr. Olivia Mackie McLean-Scocuzza-Internal Medicine

## 2017-05-14 NOTE — Patient Instructions (Addendum)
Try Claritin/Loratidine at night  Stop Benadryl  F/uin 3-4 months sooner if needed    Chronic Obstructive Pulmonary Disease Exacerbation Chronic obstructive pulmonary disease (COPD) is a long-term (chronic) condition that affects the lungs. COPD is a general term that can be used to describe many different lung problems that cause lung swelling (inflammation) and limit airflow, including chronic bronchitis and emphysema. COPD exacerbations are episodes when breathing symptoms become much worse and require extra treatment. COPD exacerbations are usually caused by infections. Without treatment, COPD exacerbations can be severe and even life threatening. Frequent COPD exacerbations can cause further damage to the lungs. What are the causes? This condition may be caused by:  Respiratory infections, including viral and bacterial infections.  Exposure to smoke.  Exposure to air pollution, chemical fumes, or dust.  Things that give you an allergic reaction (allergens).  Not taking your usual COPD medicines as directed.  Underlying medical problems, such as congestive heart failure or infections not involving the lungs.  In many cases, the cause (trigger) of this condition is not known. What increases the risk? The following factors may make you more likely to develop this condition:  Smoking cigarettes.  Old age.  Frequent prior COPD exacerbations.  What are the signs or symptoms? Symptoms of this condition include:  Increased coughing.  Increased production of mucus from your lungs (sputum).  Increased wheezing.  Increased shortness of breath.  Rapid or labored breathing.  Chest tightness.  Less energy than usual.  Sleep disruption from symptoms.  Confusion or increased sleepiness.  Often these symptoms happen or get worse even with the use of medicines. How is this diagnosed? This condition is diagnosed based on:  Your medical history.  A physical exam.  You  may also have tests, including:  A chest X-ray.  Blood tests.  Lung (pulmonary) function tests.  How is this treated? Treatment for this condition depends on the severity and cause of the symptoms. You may need to be admitted to a hospital for treatment. Some of the treatments commonly used to treat COPD exacerbations are:  Antibiotic medicines. These may be used for severe exacerbations caused by a lung infection, such as pneumonia.  Bronchodilators. These are inhaled medicines that expand the air passages and allow increased airflow.  Steroid medicines. These act to reduce inflammation in the airways. They may be given with an inhaler, taken by mouth, or given through an IV tube inserted into one of your veins.  Supplemental oxygen therapy.  Airway clearing techniques, such as noninvasive ventilation (NIV) and positive expiratory pressure (PEP). These provide respiratory support through a mask or other noninvasive device. An example of this would be using a continuous positive airway pressure (CPAP) machine to improve delivery of oxygen into your lungs.  Follow these instructions at home: Medicines  Take over-the-counter and prescription medicines only as told by your health care provider. It is important to use correct technique with inhaled medicines.  If you were prescribed an antibiotic medicine or oral steroid, take it as told by your health care provider. Do not stop taking the medicine even if you start to feel better. Lifestyle  Eat a healthy diet.  Exercise regularly.  Get plenty of sleep.  Avoid exposure to all substances that irritate the airway, especially to tobacco smoke.  Wash your hands often with soap and water to reduce the risk of infection. If soap and water are not available, use hand sanitizer.  During flu season, avoid enclosed spaces that are  crowded with people. General instructions  Drink enough fluid to keep your urine clear or pale yellow  (unless you have a medical condition that requires fluid restriction).  Use a cool mist vaporizer. This humidifies the air and makes it easier for you to clear your chest when you cough.  If you have a home nebulizer and oxygen, continue to use them as told by your health care provider.  Keep all follow-up visits as told by your health care provider. This is important. How is this prevented?  Stay up-to-date on pneumococcal and influenza (flu) vaccines. A flu shot is recommended every year to help prevent exacerbations.  Do not use any products that contain nicotine or tobacco, such as cigarettes and e-cigarettes. Quitting smoking is very important in preventing COPD from getting worse and in preventing exacerbations from happening as often. If you need help quitting, ask your health care provider.  Follow all instructions for pulmonary rehabilitation after a recent exacerbation. This can help prevent future exacerbations.  Work with your health care provider to develop and follow an action plan. This tells you what steps to take when you experience certain symptoms. Contact a health care provider if:  You have a worsening of your regular COPD symptoms. Get help right away if:  You have worsening shortness of breath, even when resting.  You have trouble talking.  You have severe chest pain.  You cough up blood.  You have a fever.  You have weakness, vomit repeatedly, or faint.  You feel confused.  You are not able to sleep because of your symptoms.  You have trouble doing daily activities. Summary  COPD exacerbations are episodes when breathing symptoms become much worse and require extra treatment above your normal treatment.  Exacerbations can be severe and even life threatening. Frequent COPD exacerbations can cause further damage to your lungs.  COPD exacerbations are usually triggered by infections such as the flu, colds, and even pneumonia.  Treatment for this  condition depends on the severity and cause of the symptoms. You may need to be admitted to a hospital for treatment.  Quitting smoking is very important to prevent COPD from getting worse and to prevent exacerbations from happening as often. This information is not intended to replace advice given to you by your health care provider. Make sure you discuss any questions you have with your health care provider. Document Released: 12/16/2006 Document Revised: 03/25/2016 Document Reviewed: 03/25/2016 Elsevier Interactive Patient Education  2018 [employer].  Nonallergic Rhinitis Nonallergic rhinitis is a condition that causes symptoms that affect the nose, such as a runny nose and a stuffed-up nose (nasal congestion) that can make it hard to breathe through the nose. This condition is different from having an allergy (allergic rhinitis). Allergic rhinitis occurs when the body's defense system (immune system) reacts to a substance that you are allergic to (allergen), such as pollen, pet dander, mold, or dust. Nonallergic rhinitis has many similar symptoms, but it is not caused by allergens. Nonallergic rhinitis can be a short-term or long-term problem. What are the causes? This condition can be caused by many different things. Some common types of nonallergic rhinitis include: Infectious rhinitis  This is usually due to an infection in the upper respiratory tract. Vasomotor rhinitis  This is the most common type of long-term nonallergic rhinitis.  It is caused by too much blood flow through the nose, which makes the tissue inside of the nose swell.  Symptoms are often triggered by strong odors,  cold air, stress, drinking alcohol, cigarette smoke, or changes in the weather. Occupational rhinitis  This type is caused by triggers in the workplace, such as chemicals, dusts, animal dander, or air pollution. Hormonal rhinitis  This type occurs in women as a result of an increase in the female  hormone estrogen.  It may occur during pregnancy, puberty, and menstrual cycles.  Symptoms improve when estrogen levels drop. Drug-induced rhinitis Several drugs can cause nonallergic rhinitis, including:  Medicines that are used to treat high blood pressure, heart disease, and Parkinson disease.  Aspirin and NSAIDs.  Over-the-counter nasal decongestant sprays. These can cause a type of nonallergic rhinitis (rhinitis medicamentosa) when they are used for more than a few days.  Nonallergic rhinitis with eosinophilia syndrome (NARES)  This type is caused by having too much of a certain type of white blood cell (eosinophil). Nonallergic rhinitis can also be caused by a reaction to eating hot or spicy foods. This does not usually cause long-term symptoms. In some cases, the cause of nonallergic rhinitis is not known. What increases the risk? You are more likely to develop this condition if:  You are 83-75 years of age.  You are a woman. Women are twice as likely to have this condition.  What are the signs or symptoms? Common symptoms of this condition include:  Nasal congestion.  Runny nose.  The feeling of mucus going down the back of the throat (postnasal drip).  Trouble sleeping at night and daytime sleepiness.  Less common symptoms include:  Sneezing.  Coughing.  Itchy nose.  Bloodshot eyes.  How is this diagnosed? This condition may be diagnosed based on:  Your symptoms and medical history.  A physical exam.  Allergy testing to rule out allergic rhinitis. You may have skin tests or blood tests.  In some cases, the health care provider may take a swab of nasal secretions to look for an increased number of eosinophils. This would be done to confirm a diagnosis of NARES. How is this treated? Treatment for this condition depends on the cause. No single treatment works for everyone. Work with your health care provider to find the best treatment for you. Treatment  may include:  Avoiding the things that trigger your symptoms.  Using medicines to relieve congestion, such as: ? Steroid nasal spray. There are many types. You may need to try a few to find out which one works best. ? Decongestant medicine. This may be an oral medicine or a nasal spray. These medicines are only used for a short time.  Using medicines to relieve a runny nose. These may include antihistamine medicines or anticholinergic nasal sprays.  Surgery to remove tissue from inside the nose may be needed in severe cases if the condition has not improved after 6-12 months of medical treatment. Follow these instructions at home:  Take or use over-the-counter and prescription medicines only as told by your health care provider. Do not stop using your medicine even if you start to feel better.  Use salt-water (saline) rinses or other solutions (nasal washes or irrigations) to wash or rinse out the inside of your nose as told by your health care provider.  Do not take NSAIDs or medicines that contain aspirin if they make your symptoms worse.  Do not drink alcohol if it makes your symptoms worse.  Do not use any tobacco products, such as cigarettes, chewing tobacco, and e-cigarettes. If you need help quitting, ask your health care provider.  Avoid secondhand smoke.  Get some exercise every day. Exercise may help reduce symptoms of nonallergic rhinitis for some people. Ask your health care provider how much exercise and what types of exercise are safe for you.  Sleep with the head of your bed raised (elevated). This may reduce nighttime nasal congestion.  Keep all follow-up visits as told by your health care provider. This is important. Contact a health care provider if:  You have a fever.  Your symptoms are getting worse at home.  Your symptoms are not responding to medicine.  You develop new symptoms, especially a headache or nosebleed. This information is not intended to  replace advice given to you by your health care provider. Make sure you discuss any questions you have with your health care provider. Document Released: 06/12/2015 Document Revised: 07/27/2015 Document Reviewed: 05/11/2015 Elsevier Interactive Patient Education  2018 [employer].

## 2017-05-14 NOTE — Addendum Note (Signed)
Addended by: Arby Barrette on: 05/14/2017 10:58 AM   Modules accepted: Orders

## 2017-05-17 NOTE — Progress Notes (Signed)
Cardiology Office Note  Date:  05/19/2017   ID:  [name], DOB [birth year], MRN [id number]  PCP:  McLean-Scocuzza, Nino Glow, MD   Chief Complaint  Patient presents with  . other    6 month follow up. Meds reviewed by the pt. verbally. Pt. c/o shortness of breath.     HPI:  77 year old female with history of  hepatitis C,  osteoporosis,  arthritis,  chronic back pain s/p multiple fusions,  chronic UTI's,  ischemic cardiomyopathy with ejection fraction 25% on echocardiogram November 2016, coronary artery disease with PCI to the mid RCA,  proximal LAD August 2016,  Significant medication intolerances Previously not interested in defibrillator or repeat echocardiogram who presents for routine follow-up of her cardiomyopathy  In follow-up today she reports having worsening shortness of breath at nighttime when trying to sleep Taking torsemide 10 mg every other day She does have some ankle swelling but does not think it is very bad  Problems with anxiety She is not taking her Celexa daily only as needed Tried half dose Xanax for sleep and this did not help Worried if she took full pill she would be groggy in the morning  Feels her weight is stable Highest weight 118.  At that weight felt terrible, bloated with leg swelling On torsemide able to get her weight down to 108 Currently weight 109.8.  Feels it is about the same at home   Previously taking plavix daily Aspirin every other day She is taking metoprolol daily, entresto twice a day but will miss a dose here and there Previously was on Lasix but this seemed to contribute to a runny nose Better on torsemide  Chronic back pain limiting her ability to exercise.  Walks with a walker  Husband does most of the housework and ADLs, takes care of her  EKG personally reviewed by myself on todays visit Shows normal sinus rhythm with rate 86 bpm left bundle branch block  Other past medical history  She previously felt  Plavix  was  causing her nose to run.  Also Previously she felt Lasix was causing her runny nose. now taking Lasix, not taking torsemide  She was unable to tolerate brilinta She was having side effects from the entresto, only able to tolerate 1/2 dose of the 24/26 mg pill once a day in the evening. Able to tolerate metoprolol, Lipitor, potassium  She is not interested in defibrillator. We've had previous discussions She did consider applying external defibrillator to put in their house  Other past medical history reviewed [facility] 10/25/14 to 8/26 for new onset acute systolic CHF, ischemic cardiomyopathy, acute respiratory distress with hypoxia, and newly diagnosed CAD s/p PCI/DES to mid RCA and prox LAD.    Admitted to [facility] in August with acute respiraotry destress with hypoxia   Echo August 2016 showed EF of 20%, akinesis of the inferior, inferoseptal, distal anterior, and apical walls. There was hypokinesis elsewhere. Cavity size was mildly dilated. Wall thickness was normal. Mild aortic regurgitation. Mild to moderate mitral regurgitation. Left atrium was mildly dilated. Right ventricle systolic was was moderate to severely reduced. A small pericardial effusion was seen.   cardiac cath on 8/25 that showed ostial LAD 30%, prox LAD 90%, ostial RCA 30%, mid RCA 90%, ostial Ramus 50%.  She underwent successful PCI/DES to the proximal LAD and mid RCA with Xience DES. She was started on aspirin and Brilinta.   she could not tolerated Brilinta 2/2 SOB. She was changed to  Plavix.    PMH:   has a past medical history of Anxiety, Arthritis, CAD (coronary artery disease), Chronic back pain, Chronic fatigue, Chronic systolic CHF (congestive heart failure) ([place]), Hepatitis C, Ischemic cardiomyopathy, Osteoporosis, Systolic CHF, chronic ([place]), and UTI (lower urinary tract infection).  PSH:    Past Surgical History:  Procedure Laterality Date  . BACK SURGERY  07/14   T10, L1  . CARDIAC  CATHETERIZATION Bilateral 10/27/2014   Procedure: Left Heart Cath and Coronary Angiography;  Surgeon: Minna Merritts, MD;  Location: [facility];  Service: Cardiovascular;  Laterality: Bilateral;  . CARDIAC CATHETERIZATION N/A 10/27/2014   Procedure: Coronary Stent Intervention;  Surgeon: Wellington Hampshire, MD;  Location: [facility];  Service: Cardiovascular;  Laterality: N/A;    Current Outpatient Medications  Medication Sig Dispense Refill  . albuterol (PROVENTIL HFA;VENTOLIN HFA) 108 (90 Base) MCG/ACT inhaler Inhale 2 puffs into the lungs every 6 (six) hours as needed for wheezing or shortness of breath. 1 Inhaler 0  . ALPRAZolam (XANAX) 0.25 MG tablet Qd to bid prn 60 tablet 0  . aspirin EC 81 MG tablet Take 81 mg by mouth daily.    Marland Kitchen atorvastatin (LIPITOR) 80 MG tablet TAKE 1 TABLET (80 MG TOTAL) BY MOUTH DAILY AT 6 PM. 90 tablet 3  . Calcium-Vitamin A-Vitamin D (LIQUID CALCIUM PO) Take by mouth. 1 tablespoon daily    . cholecalciferol (VITAMIN D) 1000 UNITS tablet Take 1,000 Units by mouth daily.    . clopidogrel (PLAVIX) 75 MG tablet Take 1 tablet (75 mg total) by mouth daily. 30 tablet 2  . denosumab (PROLIA) 60 MG/ML SOLN injection Inject 60 mg into the skin every 6 (six) months. 1 Syringe 0  . ENTRESTO 24-26 MG TAKE 1 TABLET BY MOUTH TWICE A DAY 180 tablet 0  . ipratropium (ATROVENT) 0.06 % nasal spray Place 2 sprays into both nostrils 3 (three) times daily. 15 mL 12  . Melatonin 3 MG TABS Take by mouth as needed.    . metoprolol succinate (TOPROL-XL) 25 MG 24 hr tablet TAKE 1 TABLET (25 MG TOTAL) BY MOUTH AT BEDTIME. 90 tablet 2  . Multiple Vitamins-Minerals (HAIR/SKIN/NAILS/BIOTIN PO) Take by mouth.    . ondansetron (ZOFRAN-ODT) 4 MG disintegrating tablet TAKE 1 TABLET BY MOUTH TWICE A DAY AS NEEDED FOR NAUSEA AND VOMITING 60 tablet 0  . potassium chloride (KLOR-CON 10) 10 MEQ tablet TAKE 1 TABLET (10 MEQ TOTAL) BY MOUTH EVERY OTHER DAY. 90 tablet 0  . torsemide  (DEMADEX) 10 MG tablet Take 1 tablet (10 mg total) by mouth every other day. 45 tablet 1   No current facility-administered medications for this visit.      Allergies:   Sugar-protein-starch   Social History:  The patient  reports that she has quit smoking. She has a 10.00 pack-year smoking history. she has never used smokeless tobacco. She reports that she does not drink alcohol or use drugs.   Family History:   family history includes Arthritis in her mother; Diabetes in her father; Heart disease in her father.    Review of Systems: Review of Systems  Constitutional: Negative.        Runny nose, weight gain   Respiratory: Positive for shortness of breath.   Cardiovascular: Negative.   Gastrointestinal: Negative.   Musculoskeletal: Positive for back pain.  Neurological: Negative.   Psychiatric/Behavioral: Negative.   All other systems reviewed and are negative.    PHYSICAL EXAM: VS:  BP  118/60 (BP Location: Left Arm, Patient Position: Sitting, Cuff Size: Normal)   Pulse 86   Ht 5' (1.524 m)   Wt 109 lb 8 oz (49.7 kg)   BMI 21.39 kg/m  , BMI Body mass index is 21.39 kg/m.  Constitutional:  oriented to person, place, and time. No distress. Thin, presenting with a walker HENT:  Head: Normocephalic and atraumatic.  Eyes:  no discharge. No scleral icterus.  Neck: Normal range of motion. Neck supple. No JVD present.  Cardiovascular: Normal rate, regular rhythm, normal heart sounds and intact distal pulses. Exam reveals no gallop and no friction rub.  Trace ankle edema No murmur heard. Pulmonary/Chest: Effort normal and breath sounds normal. No stridor. No respiratory distress.  no wheezes.  no rales.  no tenderness.  Abdominal: Soft.  no distension.  no tenderness.  Musculoskeletal: Normal range of motion.  no  tenderness or deformity.  Neurological:  normal muscle tone. Coordination normal. No atrophy Skin: Skin is warm and dry. No rash noted. not diaphoretic.   Psychiatric:  normal mood and affect. behavior is normal. Thought content normal.    Recent Labs: 10/29/2016: ALT 24; Hemoglobin 13.8; Platelets 199.0 05/14/2017: BUN 28; Creatinine, Ser 1.31; Potassium 5.1; Sodium 139    Lipid Panel Lab Results  Component Value Date   CHOL 128 10/29/2016   HDL 50.40 10/29/2016   LDLCALC 49 10/29/2016   TRIG 144.0 10/29/2016      Wt Readings from Last 3 Encounters:  05/19/17 109 lb 8 oz (49.7 kg)  05/14/17 110 lb 3.2 oz (50 kg)  03/24/17 113 lb 8 oz (51.5 kg)       ASSESSMENT AND PLAN:  Essential hypertension -  Blood pressure is well controlled on today's visit. No changes made to the medications. Encouraged her to take Entresto twice a day  Chronic systolic CHF (congestive heart failure) (HCC) - Worsening shortness of breath at nighttime No dramatic change in her weight Long discussion concerning monitoring weight and adjusting torsemide Suggested if she has ankle swelling abdominal bloating shortness of breath when supine that she take extra torsemide She does have anxiety as well, chronic insomnia making it difficult to determine if insomnia is from other etiology  Coronary artery disease of native artery of native heart with stable angina pectoris ([place]) -  Currently with no symptoms of angina. No further workup at this time. Continue current medication regimen.  Mixed hyperlipidemia - Cholesterol is at goal on the current lipid regimen. No changes to the medications were made.  History of coronary artery stent placement -  Denies any symptoms concerning for angina No further testing at this time.  Not very active at baseline stable.    Disposition:   F/U  6 months   Total encounter time more than 45 minutes  Greater than 50% was spent in counseling and coordination of care with the patient    Orders Placed This Encounter  Procedures  . EKG 12-Lead     Signed, Esmond Plants, M.D., Ph.D. 05/19/2017  [place], [place]

## 2017-05-18 ENCOUNTER — Encounter: Payer: Self-pay | Admitting: Internal Medicine

## 2017-05-19 ENCOUNTER — Encounter: Payer: Self-pay | Admitting: Cardiovascular Disease

## 2017-05-19 ENCOUNTER — Ambulatory Visit: Payer: PPO | Admitting: Cardiovascular Disease

## 2017-05-19 VITALS — BP 118/60 | HR 86 | Ht 60.0 in | Wt 109.5 lb

## 2017-05-19 DIAGNOSIS — I25118 Atherosclerotic heart disease of native coronary artery with other forms of angina pectoris: Secondary | ICD-10-CM

## 2017-05-19 DIAGNOSIS — I255 Ischemic cardiomyopathy: Secondary | ICD-10-CM

## 2017-05-19 DIAGNOSIS — I10 Essential (primary) hypertension: Secondary | ICD-10-CM

## 2017-05-19 DIAGNOSIS — I5022 Chronic systolic (congestive) heart failure: Secondary | ICD-10-CM | POA: Diagnosis not present

## 2017-05-19 DIAGNOSIS — E782 Mixed hyperlipidemia: Secondary | ICD-10-CM

## 2017-05-19 MED ORDER — TORSEMIDE 10 MG PO TABS: 10.0000 mg | ORAL_TABLET | ORAL | 3 refills | Status: DC

## 2017-05-19 NOTE — Patient Instructions (Addendum)
Medication Instructions:   Your physician has recommended you make the following change in your medication:  1. TAKE Torsemide 10 mg every other day and daily when weight runs high or have shortness of breath.  Your physician recommends that you weigh, daily, at the same time every day, and in the same amount of clothing. Please record your daily weights on the handout provided and bring it to your next appointment.  Take torsemide daily when weight runs high or you get shortness of breath  Labwork:  No new labs needed  Testing/Procedures:  We will order an echocardiogram for cardiomyopathy Your physician has requested that you have an echocardiogram. Echocardiography is a painless test that uses sound waves to create images of your heart. It provides your doctor with information about the size and shape of your heart and how well your heart's chambers and valves are working. This procedure takes approximately one hour. There are no restrictions for this procedure.     Follow-Up: It was a pleasure seeing you in the office today. Please call us if you have new issues that need to be addressed before your next appt.  [phone]  Your physician wants you to follow-up in: 6 months.  You will receive a reminder letter in the mail two months in advance. If you don't receive a letter, please call our office to schedule the follow-up appointment.  If you need a refill on your cardiac medications before your next appointment, please call your pharmacy.  For educational health videos Log in to : [email or web address] Or : [email or web address], password : [email or web address] Echocardiogram An echocardiogram, or echocardiography, uses sound waves (ultrasound) to produce an image of your heart. The echocardiogram is simple, painless, obtained within a short period of time, and offers valuable information to your health care provider. The images from an echocardiogram can provide information such as:  Evidence of  coronary artery disease (CAD).  Heart size.  Heart muscle function.  Heart valve function.  Aneurysm detection.  Evidence of a past heart attack.  Fluid buildup around the heart.  Heart muscle thickening.  Assess heart valve function.  Tell a health care provider about:  Any allergies you have.  All medicines you are taking, including vitamins, herbs, eye drops, creams, and over-the-counter medicines.  Any problems you or family members have had with anesthetic medicines.  Any blood disorders you have.  Any surgeries you have had.  Any medical conditions you have.  Whether you are pregnant or may be pregnant. What happens before the procedure? No special preparation is needed. Eat and drink normally. What happens during the procedure?  In order to produce an image of your heart, gel will be applied to your chest and a wand-like tool (transducer) will be moved over your chest. The gel will help transmit the sound waves from the transducer. The sound waves will harmlessly bounce off your heart to allow the heart images to be captured in real-time motion. These images will then be recorded.  You may need an IV to receive a medicine that improves the quality of the pictures. What happens after the procedure? You may return to your normal schedule including diet, activities, and medicines, unless your health care provider tells you otherwise. This information is not intended to replace advice given to you by your health care provider. Make sure you discuss any questions you have with your health care provider. Document Released: 02/16/2000 Document Revised: 10/07/2015 Document Reviewed: 10/26/2012 Elsevier Interactive Patient Education  2017 Elsevier  Inc.  

## 2017-05-22 ENCOUNTER — Telehealth: Payer: Self-pay | Admitting: Cardiovascular Disease

## 2017-05-22 ENCOUNTER — Other Ambulatory Visit: Payer: Self-pay | Admitting: *Deleted

## 2017-05-22 DIAGNOSIS — R609 Edema, unspecified: Secondary | ICD-10-CM

## 2017-05-22 MED ORDER — POTASSIUM CHLORIDE ER 10 MEQ PO TBCR: EXTENDED_RELEASE_TABLET | ORAL | 2 refills | Status: DC

## 2017-05-22 MED ORDER — METOPROLOL SUCCINATE ER 25 MG PO TB24: 25.0000 mg | ORAL_TABLET | Freq: Every day | ORAL | 2 refills | Status: DC

## 2017-05-22 MED ORDER — TORSEMIDE 10 MG PO TABS: 10.0000 mg | ORAL_TABLET | ORAL | 3 refills | Status: DC

## 2017-05-22 NOTE — Telephone Encounter (Signed)
°*  STAT* If patient is at the pharmacy, call can be transferred to refill team.   1. Which medications need to be refilled? (please list name of each medication and dose if known)  Metoprolol  Potassium  Torsemide   2. Which pharmacy/location (including street and city if local pharmacy) [facility] [address]   3. Do they need a 30 day or 90 day supply? 90 day

## 2017-05-22 NOTE — Telephone Encounter (Signed)
Metoprolol #90 #2 Torsemide #90 #2 [facility] awaiting potassium refill ok.

## 2017-05-22 NOTE — Telephone Encounter (Signed)
OK to refill potassium.  Thanks!

## 2017-05-28 ENCOUNTER — Telehealth: Payer: Self-pay | Admitting: Cardiovascular Disease

## 2017-05-28 DIAGNOSIS — R609 Edema, unspecified: Secondary | ICD-10-CM

## 2017-05-28 MED ORDER — TORSEMIDE 10 MG PO TABS: 10.0000 mg | ORAL_TABLET | ORAL | 3 refills | Status: DC

## 2017-05-28 MED ORDER — POTASSIUM CHLORIDE ER 10 MEQ PO TBCR: EXTENDED_RELEASE_TABLET | ORAL | 3 refills | Status: DC

## 2017-05-28 NOTE — Telephone Encounter (Signed)
°*  STAT* If patient is at the pharmacy, call can be transferred to refill team.   1. Which medications need to be refilled? (please list name of each medication and dose if known)  Torsemide and Potassium   2. Which pharmacy/location (including street and city if local pharmacy) is medication to be sent to?  [facility] [address] [facility]  3. Do they need a 30 day or 90 day supply? 90 day    Pt calling stating she needs [place] to send in new prescriptions on her Torsemide and Potassium  She states when she was here last we changed how often she could take them  Please advise

## 2017-05-28 NOTE — Telephone Encounter (Signed)
Please review for refill.  

## 2017-06-06 ENCOUNTER — Other Ambulatory Visit: Payer: Self-pay | Admitting: Internal Medicine

## 2017-06-06 DIAGNOSIS — J449 Chronic obstructive pulmonary disease, unspecified: Secondary | ICD-10-CM

## 2017-06-06 MED ORDER — ALBUTEROL SULFATE HFA 108 (90 BASE) MCG/ACT IN AERS: 2.0000 | INHALATION_SPRAY | Freq: Four times a day (QID) | RESPIRATORY_TRACT | 11 refills | Status: DC | PRN

## 2017-06-08 ENCOUNTER — Other Ambulatory Visit: Payer: Self-pay | Admitting: Cardiovascular Disease

## 2017-06-11 ENCOUNTER — Ambulatory Visit: Payer: PPO

## 2017-06-11 ENCOUNTER — Telehealth: Payer: Self-pay | Admitting: Cardiovascular Disease

## 2017-06-11 DIAGNOSIS — R609 Edema, unspecified: Secondary | ICD-10-CM

## 2017-06-11 NOTE — Telephone Encounter (Signed)
Please clarify with patient regarding her medication for potassium.

## 2017-06-11 NOTE — Telephone Encounter (Signed)
New Message  Pt c/o medication issue:  1. Name of Medication:  potassium chloride 10 meq tablet once daily  2. How are you currently taking this medication (dosage and times per day)?  See above  3. Are you having a reaction (difficulty breathing--STAT)?  N/A  4. What is your medication issue?  Pt verbalized dosage is listed on prescription bottle incorrectly. Pt verbalized it is suppose to be daily instead of every other day since she is taking her torsemide every other day. Pt did verbalized we did not to resend rx but it needs to be listed so her insurance will pay for them and for her knowledge as well.

## 2017-06-11 NOTE — Telephone Encounter (Signed)
Patient is to take torsemide every other day with her fluid pill. If she requires extra fluid pill on other days she should take the potassium with that as well.

## 2017-06-12 NOTE — Telephone Encounter (Signed)
Please contact the patient regarding this information on her medication instructions.

## 2017-06-13 NOTE — Telephone Encounter (Signed)
Spoke with patient and she is reporting that she is taking 2 tablets of Torsemide once daily and also taking 2 tablets of potassium once daily. Reviewed that instructions we have were for her to take 1 tablet every other day and only as needed on the other days. She reports that she doesn't go to the bathroom if she doesn't take it. Reviewed in detail that she should only be taking this for increased weight or shortness of breath. Reviewed that I would make Dr. Rockey Situ aware of how she is taking her medications and obtain approval for refills based on how frequent she is taking them. She verbalized understanding, was appreciative for the call, agreeable with the plan, and had no further questions at this time.

## 2017-06-14 ENCOUNTER — Other Ambulatory Visit: Payer: Self-pay | Admitting: Cardiovascular Disease

## 2017-06-16 ENCOUNTER — Ambulatory Visit (INDEPENDENT_AMBULATORY_CARE_PROVIDER_SITE_OTHER): Payer: PPO

## 2017-06-16 ENCOUNTER — Other Ambulatory Visit: Payer: Self-pay

## 2017-06-16 ENCOUNTER — Other Ambulatory Visit: Payer: Self-pay | Admitting: *Deleted

## 2017-06-16 ENCOUNTER — Telehealth: Payer: Self-pay | Admitting: Cardiovascular Disease

## 2017-06-16 DIAGNOSIS — I255 Ischemic cardiomyopathy: Secondary | ICD-10-CM

## 2017-06-16 MED ORDER — CLOPIDOGREL BISULFATE 75 MG PO TABS: 75.0000 mg | ORAL_TABLET | Freq: Every day | ORAL | 3 refills | Status: DC

## 2017-06-16 NOTE — Telephone Encounter (Signed)
I have reviewed labs from march Would recommend only one potassium a day Stay on 2 torsemide a day, skip torsemide/potassium on one day a week, such as Sunday

## 2017-06-16 NOTE — Telephone Encounter (Signed)
°*  STAT* If patient is at the pharmacy, call can be transferred to refill team.   1. Which medications need to be refilled? (please list name of each medication and dose if known) clopidogrel (PLAVIX) 75 MG 1 tablet daily  2. Which pharmacy/location (including street and city if local pharmacy) is [facility] be sent to[facility] [address] [facility]  3. Do they need a 30 day or 90 day supply? 90 day

## 2017-06-16 NOTE — Telephone Encounter (Signed)
Requested Prescriptions   Signed Prescriptions Disp Refills  . clopidogrel (PLAVIX) 75 MG tablet 90 tablet 3    Sig: Take 1 tablet (75 mg total) by mouth daily.    Authorizing Provider: GOLLAN, TIMOTHY J    Ordering User: Adom Schoeneck C    

## 2017-06-16 NOTE — Telephone Encounter (Signed)
Requested Prescriptions   Signed Prescriptions Disp Refills  . clopidogrel (PLAVIX) 75 MG tablet 90 tablet 3    Sig: Take 1 tablet (75 mg total) by mouth daily.    Authorizing Provider: GOLLAN, TIMOTHY J    Ordering User: Nasiir Monts C    

## 2017-06-17 MED ORDER — TORSEMIDE 10 MG PO TABS: 20.0000 mg | ORAL_TABLET | ORAL | 3 refills | Status: DC

## 2017-06-17 MED ORDER — POTASSIUM CHLORIDE ER 10 MEQ PO TBCR: 10.0000 meq | EXTENDED_RELEASE_TABLET | Freq: Every day | ORAL | 3 refills | Status: DC

## 2017-06-17 NOTE — Addendum Note (Signed)
Addended by: Vanessa Ralphs on: 06/17/2017 08:57 AM   Modules accepted: Orders

## 2017-06-17 NOTE — Telephone Encounter (Addendum)
S/w patient. She verbalized understanding to take torsemide 2 tablets daily except for Sunday and to take one potassium daily except for Sunday. She did not need any refills at this time. Med list updated.

## 2017-06-27 ENCOUNTER — Telehealth: Payer: Self-pay | Admitting: Internal Medicine

## 2017-06-27 NOTE — Telephone Encounter (Signed)
Copied from [place]. Topic: Quick Communication - Rx Refill/Question >> Jun 27, 2017  1:16 PM Margot Ables wrote: Medication: citalopram (CELEXA) 10 MG tablet - pt has medication for 1 week - she states pharmacy reached out yesterday - pt is asking if she can get refills for a year in 90 day supply at a time because it is cheaper for her Has the patient contacted their pharmacy? yes Preferred Pharmacy (with phone number or street name): [id number] [name], [place] [phone] (Phone) [phone] (Fax)

## 2017-06-28 NOTE — Telephone Encounter (Signed)
Pt requesting refills on citalopram (CELEXA) 10MG  tab. Not on pt's current medication list.Pt is requesting refills to be dispensed in a 90 day supply to last for a year due to it being cheaper.   LOV: 05/14/17  Dr[facility] [place] [address]

## 2017-06-30 ENCOUNTER — Other Ambulatory Visit: Payer: Self-pay | Admitting: Internal Medicine

## 2017-06-30 ENCOUNTER — Other Ambulatory Visit: Payer: Self-pay

## 2017-06-30 DIAGNOSIS — F419 Anxiety disorder, unspecified: Secondary | ICD-10-CM

## 2017-06-30 MED ORDER — CITALOPRAM HYDROBROMIDE 10 MG PO TABS: 10.0000 mg | ORAL_TABLET | Freq: Every day | ORAL | 3 refills | Status: DC

## 2017-06-30 NOTE — Progress Notes (Unsigned)
Pre visit review using our clinic review tool, if applicable. No additional management support is needed unless otherwise documented below in the visit note. 

## 2017-06-30 NOTE — Telephone Encounter (Signed)
I looked in historical meds and past notes and I do not see where she was on this medication.Please advise.

## 2017-06-30 NOTE — Telephone Encounter (Signed)
Ok to refill.  Medication is being taken by patient. Medication was recently filled at pharmacy.

## 2017-07-08 ENCOUNTER — Telehealth: Payer: Self-pay | Admitting: Cardiovascular Disease

## 2017-07-08 DIAGNOSIS — R609 Edema, unspecified: Secondary | ICD-10-CM

## 2017-07-08 MED ORDER — POTASSIUM CHLORIDE ER 10 MEQ PO TBCR: 10.0000 meq | EXTENDED_RELEASE_TABLET | Freq: Every day | ORAL | 3 refills | Status: DC

## 2017-07-08 NOTE — Telephone Encounter (Signed)
Patient calling in regards to potassium medication States that Dr. Rockey Situ told her to take 1 every day but the bottle states every other day  Runs out of medication too quickly Please call to discuss

## 2017-07-08 NOTE — Telephone Encounter (Signed)
Spoke with patient. She needs refills on her potassium. Refill for 90-day supply sent to patient's pharmacy. She was appreciative.

## 2017-08-23 ENCOUNTER — Other Ambulatory Visit: Payer: Self-pay | Admitting: Family

## 2017-08-26 NOTE — Telephone Encounter (Signed)
Looks like Dr Rockey Situ usually prescribes  lov 05/14/17 Dr Aundra Dubin No office visit scheduled

## 2017-08-29 NOTE — Telephone Encounter (Signed)
Sent call pt and pharmacy note reduced dose   [place]

## 2017-08-29 NOTE — Telephone Encounter (Signed)
For some reason, this request came back to me.  She is one of yours [place] and appears got demadex from you earlier this year.   I havent seen pt since 11/2016 - and that was our only only OV.   I will leave for you to decide if you refill demadex.

## 2017-09-28 ENCOUNTER — Other Ambulatory Visit: Payer: Self-pay | Admitting: Family

## 2017-09-29 NOTE — Telephone Encounter (Signed)
Last office visit 05/14/17 Dr Aundra Dubin Nov scheduled

## 2017-10-01 NOTE — Telephone Encounter (Signed)
This Rx was sent 08/28/17 Torsemide 10 mg daily as needed #90 1 year supply not sure why I am getting this as refilled and pt should only be taking 1 pill per day  Call pt and inform pharmacy   Cancel this request   [place]

## 2017-10-03 NOTE — Telephone Encounter (Signed)
Called and advised pharmacy that patient should only be on once day dosing.

## 2017-10-21 ENCOUNTER — Telehealth: Payer: Self-pay | Admitting: Cardiovascular Disease

## 2017-10-21 NOTE — Telephone Encounter (Signed)
Pt states she is on lipitor and she thinks that is where her hair loss is coming from. She asks if she can have a different rx. Please call to discuss.

## 2017-10-21 NOTE — Telephone Encounter (Signed)
Megan/ Claiborne Billings,  Do you know of lipitor causing hair loss/ statins in general?  Thanks!

## 2017-10-22 NOTE — Telephone Encounter (Signed)
Patient call to check on status

## 2017-10-22 NOTE — Telephone Encounter (Signed)
Lipitor is generally not associated with hair loss. She is on metoprolol which can be associated with hair loss. Not sure if timing lines up with start of metoprolol or just atorvastatin? If she feels it is the atorvastatin could consider a change to rosuvastatin 40mg  daily to see if symptoms improve.

## 2017-10-22 NOTE — Telephone Encounter (Signed)
Patient is agreeable to the switch. Routing to Dr Rockey Situ to make sure this is ok with him.

## 2017-10-23 NOTE — Telephone Encounter (Signed)
Patient notified that we are waiting to verify with Dr Rockey Situ and then we will send in the medication. She was appreciative.

## 2017-10-23 NOTE — Telephone Encounter (Signed)
Patient calling to check on status - patient currently not taking prescribed medication  Please call to advise

## 2017-10-24 ENCOUNTER — Telehealth: Payer: Self-pay

## 2017-10-24 MED ORDER — ROSUVASTATIN CALCIUM 40 MG PO TABS: 40.0000 mg | ORAL_TABLET | Freq: Every day | ORAL | 3 refills | Status: DC

## 2017-10-24 NOTE — Telephone Encounter (Signed)
Copied from [place] [phone]. Topic: General - Other >> Oct 24, 2017  1:49 PM Yvette Rack wrote: Reason for CRM: Pt states she has the appt card with date 11/06/17 for prolia injection but she is unsure of the time. Pt requests call back. Cb# [phone]  Patient has been scheduled for prolia injection on 11-06-17 at 2:00 and patient is aware.

## 2017-10-24 NOTE — Telephone Encounter (Signed)
I called and spoke with the patient. I advised her we will send in crestor 40 mg once daily to the pharmacy. I have advised her to maintain good nutrition. She is aware if she is still having hair loss after changing her statin and her nutrition is well balanced, to call us back as her hair loss may be coming from her metoprolol.  The patient is agreeable and voices understanding.

## 2017-10-24 NOTE — Telephone Encounter (Signed)
That is fine to change the statin Would also stress to her that nutrition can affect hair growth Needs to maintain adequate nutrition

## 2017-11-04 ENCOUNTER — Other Ambulatory Visit: Payer: Self-pay

## 2017-11-04 NOTE — Telephone Encounter (Signed)
*  STAT* If patient is at the pharmacy, call can be transferred to refill team.   1. Which medications need to be refilled? (please list name of each medication and dose if known) Torsemide  2. Which pharmacy/location (including street and city if local pharmacy) is [facility] to[facility]  3. Do they need a 30 day or 90 day supply? 90 day, but pt is requesting a year supply

## 2017-11-06 ENCOUNTER — Encounter: Payer: Self-pay | Admitting: Internal Medicine

## 2017-11-06 ENCOUNTER — Telehealth: Payer: Self-pay | Admitting: Internal Medicine

## 2017-11-06 ENCOUNTER — Telehealth: Payer: Self-pay

## 2017-11-06 ENCOUNTER — Ambulatory Visit: Payer: PPO

## 2017-11-06 ENCOUNTER — Other Ambulatory Visit: Payer: Self-pay | Admitting: Internal Medicine

## 2017-11-06 DIAGNOSIS — M810 Age-related osteoporosis without current pathological fracture: Secondary | ICD-10-CM

## 2017-11-06 MED ORDER — DENOSUMAB 60 MG/ML ~~LOC~~ SOSY: 60.0000 mg | PREFILLED_SYRINGE | SUBCUTANEOUS | 2 refills | Status: DC

## 2017-11-06 NOTE — Telephone Encounter (Signed)
paperwork has been placed on Dr Dannial [place] desk for signature.

## 2017-11-06 NOTE — Telephone Encounter (Signed)
Pt Dropped off parking placard form to be filled out. Placed in Dr. Oleh Genin color folder upfront.

## 2017-11-06 NOTE — Telephone Encounter (Signed)
Copied from [place] [phone]. Topic: General - Other >> Nov 06, 2017 12:31 PM Mcneil, Ja-Kwan wrote: Reason for CRM: Pt states she spoke with her insurance company and it was suggested that she have the doctor send in a Rx for the Prolia injection to pharmacy for pt to pick up. Pt requests call back. [phone]

## 2017-11-06 NOTE — Telephone Encounter (Signed)
Patient is a Prolia Patient I have started insurance verification ok to schedule patient?

## 2017-11-06 NOTE — Telephone Encounter (Signed)
Yes if cost is not an issue   [place]

## 2017-11-06 NOTE — Telephone Encounter (Signed)
Please call pt to discuss not sure if Rx needed  Sent in case   Thanks [place]

## 2017-11-07 NOTE — Telephone Encounter (Signed)
Patient scheduled.

## 2017-11-11 ENCOUNTER — Telehealth: Payer: Self-pay

## 2017-11-11 MED ORDER — TORSEMIDE 10 MG PO TABS: 10.0000 mg | ORAL_TABLET | Freq: Every day | ORAL | 0 refills | Status: DC | PRN

## 2017-11-11 NOTE — Telephone Encounter (Addendum)
Patient would like to know if insurance approved prolia injection, patient is scheduled for 11/12/17 CB # [phone]

## 2017-11-11 NOTE — Telephone Encounter (Signed)
Patient notified Prolia approved .

## 2017-11-11 NOTE — Telephone Encounter (Signed)
Requested Prescriptions   Signed Prescriptions Disp Refills  . torsemide (DEMADEX) 10 MG tablet 90 tablet 0    Sig: Take 1 tablet (10 mg total) by mouth daily as needed (As needed for swelling).    Authorizing Provider: Minna Merritts    Ordering User: Britt Bottom

## 2017-11-11 NOTE — Telephone Encounter (Signed)
*  STAT* If patient is at the pharmacy, call can be transferred to refill team.   1. Which medications need to be refilled? (please list name of each medication and dose if known) Torsemide  2. Which pharmacy/location (including street and city if local pharmacy) is medication to be [facility] to? [facility]  3. Do they need a 30 day or 90 day supply? 90  This is the 2nd request for this medication

## 2017-11-11 NOTE — Telephone Encounter (Signed)
Requested Prescriptions   Signed Prescriptions Disp Refills  . torsemide (DEMADEX) 10 MG tablet 90 tablet 0    Sig: Take 1 tablet (10 mg total) by mouth daily as needed (As needed for swelling).    Authorizing Provider: Minna Merritts    Ordering User: Britt Bottom  ;

## 2017-11-11 NOTE — Telephone Encounter (Signed)
prolia approval

## 2017-11-12 ENCOUNTER — Ambulatory Visit (INDEPENDENT_AMBULATORY_CARE_PROVIDER_SITE_OTHER): Payer: PPO | Admitting: *Deleted

## 2017-11-12 DIAGNOSIS — Z23 Encounter for immunization: Secondary | ICD-10-CM

## 2017-11-12 DIAGNOSIS — M810 Age-related osteoporosis without current pathological fracture: Secondary | ICD-10-CM

## 2017-11-12 MED ORDER — DENOSUMAB 60 MG/ML ~~LOC~~ SOSY
60.0000 mg | PREFILLED_SYRINGE | Freq: Once | SUBCUTANEOUS | Status: AC
  Administered 2017-11-12: 60 mg via SUBCUTANEOUS

## 2017-11-12 NOTE — Progress Notes (Signed)
Patient presented for Prolia injection to [facility] [place], patient voiced no concerns or complaints during or after injection. 

## 2017-11-19 ENCOUNTER — Other Ambulatory Visit: Payer: Self-pay | Admitting: Family

## 2017-11-19 ENCOUNTER — Other Ambulatory Visit: Payer: Self-pay | Admitting: *Deleted

## 2017-11-19 ENCOUNTER — Telehealth: Payer: Self-pay | Admitting: Cardiovascular Disease

## 2017-11-19 NOTE — Telephone Encounter (Signed)
°*  STAT* If patient is at the pharmacy, call can be transferred to refill team.   1. Which medications need to be refilled? (please list name of each medication and dose if known)  Torsemide (DEMADEX) 10 MG   2. Which pharmacy/location (including street and city if local pharmacy) is [facility] to[facility] [address]  3. Do they need a 30 day or 90 day supply? 90 day   Patient states the pharmacy still has not received this - patient scheduled an appointment with Dr. Rockey Situ on 10/30

## 2017-11-19 NOTE — Telephone Encounter (Signed)
S/w patient. She just wanted to let Dr Rockey Situ know she would like lab work drawn at upcoming appointment if he thinks she needs it. Advised her he will take a look and for her to ask him as well. No further questions at this time.

## 2017-11-19 NOTE — Telephone Encounter (Signed)
Patient is scheduled to see Dr. Rockey Situ on 10/30 Patient would like to know if she can get her lab work done on this day  Please call to discuss

## 2017-11-19 NOTE — Telephone Encounter (Signed)
I spoke with Pharmacy and he mentioned that pt's medication has been ready and is awaiting pick up. Contacted pt unable to leave pt/no answer/vm full.

## 2017-11-20 MED ORDER — TORSEMIDE 20 MG PO TABS: 20.0000 mg | ORAL_TABLET | Freq: Every day | ORAL | 3 refills | Status: DC

## 2017-11-20 NOTE — Telephone Encounter (Addendum)
°  Pt c/o medication issue:  1. Name of Medication: torsemide   2. How are you currently taking this medication (dosage and times per day)? 20 mg po as directed except Sunday   3. Are you having a reaction (difficulty breathing--STAT)? No   4. What is your medication issue? Patient says the wrong rx sent to pharmacy she is taking 20 mg daily for swelling   Please call to let her know correct rx sent .  Patient husband at the pharmacy now and patient is frustrated

## 2017-11-20 NOTE — Addendum Note (Signed)
Addended by: Vanessa Ralphs on: 11/20/2017 01:53 PM   Modules accepted: Orders

## 2017-11-20 NOTE — Telephone Encounter (Signed)
S/w patient. In looking back at chart, patient is taking torsemide 20 mg once a day except on Sundays. Refill sent to pharmacy. Patient was very Patent attorney.

## 2017-11-26 ENCOUNTER — Other Ambulatory Visit: Payer: Self-pay | Admitting: Cardiovascular Disease

## 2017-12-30 NOTE — Progress Notes (Signed)
Cardiology Office Note  Date:  12/31/2017   ID:  [name], DOB 1940-12-27, MRN [id number]  PCP:  McLean-Scocuzza, Nino Glow, MD   Chief Complaint  Patient presents with  . other    6 mo  f/u Medications reviewed verbally.     HPI:  77 year old female with history of  hepatitis C,  osteoporosis,  arthritis,  chronic back pain s/p multiple fusions,  chronic UTI's,  ischemic cardiomyopathy with ejection fraction 25% on echocardiogram November 2016, coronary artery disease with PCI to the mid RCA,  proximal LAD August 2016,  Significant medication intolerances Previously not interested in defibrillator or repeat echocardiogram who presents for routine follow-up of her cardiomyopathy, CAD  Husband is not with her on today his visit Feels well, denies significant shortness of breath Torsemide 20 mg daily, better symptoms when she increased from 10 mg up to 20 mg Losing hair, major concern for her Thought was 1 of her medications Thinks it might be metoprolol, cut back on the dose to 1/4 pill past several weeks Blood pressure low at home, elevated in the office today entresto once a day, does not think she can handle twice a day  Walks on treadmill  15 three days a week No leg swelling  Walks with a cane, limited by kyphosis Used to have back pain but this has dramatically improved Reports that she previously got hooked on the pain medication, now doing better Anxiety is better Previously was not taking Celexa on a regular basis  Eating more, weight is trending upwards No problems with runny nose on today's visit as on previous visits  We did discuss previous echocardiogram April 2019 showed severely depressed LV function, no improvement from 2018  EKG personally reviewed by myself on todays visit Shows normal sinus rhythm with left bundle branch block rate 76 bpm  Other past medical history  She previously felt  Plavix was  causing her nose to run.  Also Previously  she felt Lasix was causing her runny nose. now taking Lasix, not taking torsemide  She was unable to tolerate brilinta She was having side effects from the entresto, only able to tolerate 1/2 dose of the 24/26 mg pill once a day in the evening. Able to tolerate metoprolol, Lipitor, potassium  She is not interested in defibrillator. We've had previous discussions She did consider applying external defibrillator to put in their house  Other past medical history reviewed [facility] 10/25/14 to 8/26 for new onset acute systolic CHF, ischemic cardiomyopathy, acute respiratory distress with hypoxia, and newly diagnosed CAD s/p PCI/DES to mid RCA and prox LAD.    Admitted to [facility] in August with acute respiraotry destress with hypoxia   Echo August 2016 showed EF of 20%, akinesis of the inferior, inferoseptal, distal anterior, and apical walls. There was hypokinesis elsewhere. Cavity size was mildly dilated. Wall thickness was normal. Mild aortic regurgitation. Mild to moderate mitral regurgitation. Left atrium was mildly dilated. Right ventricle systolic was was moderate to severely reduced. A small pericardial effusion was seen.   cardiac cath on 8/25 that showed ostial LAD 30%, prox LAD 90%, ostial RCA 30%, mid RCA 90%, ostial Ramus 50%.  She underwent successful PCI/DES to the proximal LAD and mid RCA with Xience DES. She was started on aspirin and Brilinta.   she could not tolerated Brilinta 2/2 SOB. She was changed to Plavix.    PMH:   has a past medical history of Anxiety, Arthritis, CAD (coronary artery  disease), Chronic back pain, Chronic fatigue, Chronic systolic CHF (congestive heart failure) ([place]), Hepatitis C, Ischemic cardiomyopathy, Osteoporosis, Systolic CHF, chronic ([place]), and UTI (lower urinary tract infection).  PSH:    Past Surgical History:  Procedure Laterality Date  . BACK SURGERY  07/14   T10, L1  . CARDIAC CATHETERIZATION Bilateral 10/27/2014   Procedure: Left Heart  Cath and Coronary Angiography;  Surgeon: Minna Merritts, MD;  Location: [place] [facility];  Service: Cardiovascular;  Laterality: Bilateral;  . CARDIAC CATHETERIZATION N/A 10/27/2014   Procedure: Coronary Stent Intervention;  Surgeon: Wellington Hampshire, MD;  Location: [place] [facility];  Service: Cardiovascular;  Laterality: N/A;    Current Outpatient Medications  Medication Sig Dispense Refill  . albuterol (PROVENTIL HFA;VENTOLIN HFA) 108 (90 Base) MCG/ACT inhaler Inhale 2 puffs into the lungs every 6 (six) hours as needed for wheezing or shortness of breath. 1 Inhaler 11  . ALPRAZolam (XANAX) 0.25 MG tablet Qd to bid prn 60 tablet 0  . Calcium-Vitamin A-Vitamin D (LIQUID CALCIUM PO) Take by mouth. 1 tablespoon daily    . cholecalciferol (VITAMIN D) 1000 UNITS tablet Take 1,000 Units by mouth daily.    . citalopram (CELEXA) 10 MG tablet Take 1 tablet (10 mg total) by mouth daily. 90 tablet 3  . clopidogrel (PLAVIX) 75 MG tablet Take 1 tablet (75 mg total) by mouth daily. 90 tablet 3  . denosumab (PROLIA) 60 MG/ML SOSY injection Inject 60 mg into the skin every 6 (six) months. 1 mL 2  . ENTRESTO 24-26 MG TAKE 1 TABLET BY MOUTH TWICE A DAY 180 tablet 0  . ipratropium (ATROVENT) 0.06 % nasal spray Place 2 sprays into both nostrils 3 (three) times daily. 15 mL 12  . Melatonin 3 MG TABS Take by mouth as needed.    . metoprolol succinate (TOPROL-XL) 25 MG 24 hr tablet Take 1 tablet (25 mg total) by mouth at bedtime. 90 tablet 2  . Multiple Vitamins-Minerals (HAIR/SKIN/NAILS/BIOTIN PO) Take by mouth.    . ondansetron (ZOFRAN-ODT) 4 MG disintegrating tablet TAKE 1 TABLET BY MOUTH TWICE A DAY AS NEEDED FOR NAUSEA AND VOMITING 60 tablet 0  . potassium chloride (KLOR-CON 10) 10 MEQ tablet Take 1 tablet (10 mEq total) by mouth daily. Except for Sunday. 90 tablet 3  . rosuvastatin (CRESTOR) 40 MG tablet Take 1 tablet (40 mg total) by mouth daily. 30 tablet 3  . torsemide (DEMADEX) 20 MG tablet Take 1  tablet (20 mg total) by mouth daily. Except on Sundays. 90 tablet 3   No current facility-administered medications for this visit.      Allergies:   Sugar-protein-starch   Social History:  The patient  reports that she has quit smoking. She has a 10.00 pack-year smoking history. She has never used smokeless tobacco. She reports that she does not drink alcohol or use drugs.   Family History:   family history includes Arthritis in her mother; Diabetes in her father; Heart disease in her father.    Review of Systems: Review of Systems  Constitutional: Negative.        Runny nose, weight gain   Respiratory: Negative.   Cardiovascular: Negative.   Gastrointestinal: Negative.   Musculoskeletal: Negative.        Gait instability  Neurological: Negative.   Psychiatric/Behavioral: Negative.   All other systems reviewed and are negative.    PHYSICAL EXAM: VS:  BP 140/60 (BP Location: Left Arm, Patient Position: Sitting, Cuff Size: Normal)  Pulse 76   Ht 5' (1.524 m)   Wt 115 lb (52.2 kg)   BMI 22.46 kg/m  , BMI Body mass index is 22.46 kg/m. Constitutional:  oriented to person, place, and time. No distress.  HENT:  Head: Grossly normal Eyes:  no discharge. No scleral icterus.  Neck: No JVD, no carotid bruits  Cardiovascular: Regular rate and rhythm, no murmurs appreciated Pulmonary/Chest: Clear to auscultation bilaterally, no wheezes or rails Abdominal: Soft.  no distension.  no tenderness.  Musculoskeletal: Normal range of motion Neurological:  normal muscle tone. Coordination normal. No atrophy Skin: Skin warm and dry Psychiatric: normal affect, pleasant  Recent Labs: 05/14/2017: BUN 28; Creatinine, Ser 1.31; Potassium 5.1; Sodium 139    Lipid Panel Lab Results  Component Value Date   CHOL 128 10/29/2016   HDL 50.40 10/29/2016   LDLCALC 49 10/29/2016   TRIG 144.0 10/29/2016      Wt Readings from Last 3 Encounters:  12/31/17 115 lb (52.2 kg)  05/19/17 109 lb 8  oz (49.7 kg)  05/14/17 110 lb 3.2 oz (50 kg)     ASSESSMENT AND PLAN:  Essential hypertension -  Encouraged her to take Entresto twice a day She prefers to take this once a day Also she is cutting back on her metoprolol out of fear she is losing her hair from side effect Recommend she try this for several more weeks and if no improvement that she go back on the regular dose metoprolol given the cardiac benefit  Chronic systolic CHF (congestive heart failure) (HCC) - Appears euvolemic, Taking torsemide 20 daily Recommend she try to increase the metoprolol back to regular dose Does not want higher dose Entresto  Coronary artery disease of native artery of native heart with stable angina pectoris ([place])  Currently with no symptoms of angina. No further workup at this time. Continue current medication regimen. Stable  Mixed hyperlipidemia - Previous numbers well controlled, no recent lipid panel available Stop Lipitor out of fear it was causing hair loss now on Crestor  History of coronary artery stent placement -  Denies anginal symptoms, no further work-up at this time  Disposition:   F/U  6 months   Total encounter time more than 25 minutes  Greater than 50% was spent in counseling and coordination of care with the patient    Orders Placed This Encounter  Procedures  . Basic metabolic panel  . EKG 12-Lead     Signed, Esmond Plants, M.D., Ph.D. 12/31/2017  [place], [place]

## 2017-12-31 ENCOUNTER — Ambulatory Visit (INDEPENDENT_AMBULATORY_CARE_PROVIDER_SITE_OTHER): Payer: PPO | Admitting: Cardiovascular Disease

## 2017-12-31 ENCOUNTER — Encounter: Payer: Self-pay | Admitting: Cardiovascular Disease

## 2017-12-31 VITALS — BP 140/60 | HR 76 | Ht 60.0 in | Wt 115.0 lb

## 2017-12-31 DIAGNOSIS — I5022 Chronic systolic (congestive) heart failure: Secondary | ICD-10-CM | POA: Diagnosis not present

## 2017-12-31 DIAGNOSIS — I25118 Atherosclerotic heart disease of native coronary artery with other forms of angina pectoris: Secondary | ICD-10-CM | POA: Diagnosis not present

## 2017-12-31 DIAGNOSIS — I10 Essential (primary) hypertension: Secondary | ICD-10-CM | POA: Diagnosis not present

## 2017-12-31 DIAGNOSIS — I255 Ischemic cardiomyopathy: Secondary | ICD-10-CM

## 2017-12-31 DIAGNOSIS — E782 Mixed hyperlipidemia: Secondary | ICD-10-CM

## 2017-12-31 DIAGNOSIS — F419 Anxiety disorder, unspecified: Secondary | ICD-10-CM

## 2017-12-31 NOTE — Patient Instructions (Addendum)
Medication Instructions:   Hold the aspirin Stay on plavix  If you need a refill on your cardiac medications before your next appointment, please call your pharmacy.    Lab work: We will check BMP today  If you have labs (blood work) drawn today and your tests are completely normal, you will receive your results only by: Marland Kitchen MyChart Message (if you have MyChart) OR . A paper copy in the mail If you have any lab test that is abnormal or we need to change your treatment, we will call you to review the results.   Testing/Procedures: No new testing needed   Follow-Up: At [facility], you and your health needs are our priority.  As part of our continuing mission to provide you with exceptional heart care, we have created designated Provider Care Teams.  These Care Teams include your primary Cardiologist (physician) and Advanced Practice Providers (APPs -  Physician Assistants and Nurse Practitioners) who all work together to provide you with the care you need, when you need it.  . You will need a follow up appointment in 6 months .   Please call our office 2 months in advance to schedule this appointment.    . Providers on your designated Care Team:   . Murray Hodgkins, NP . Christell Faith, PA-C . Marrianne Mood, PA-C  Any Other Special Instructions Will Be Listed Below (If Applicable).  For educational health videos Log in to : [email or web address] Or : [email or web address], password : [email or web address]

## 2018-01-01 LAB — BASIC METABOLIC PANEL
BUN/Creatinine Ratio: 21 (ref 12–28)
BUN: 25 mg/dL (ref 8–27)
CHLORIDE: 100 mmol/L (ref 96–106)
CO2: 22 mmol/L (ref 20–29)
CREATININE: 1.17 mg/dL — AB (ref 0.57–1.00)
Calcium: 10.1 mg/dL (ref 8.7–10.3)
GFR calc Af Amer: 52 mL/min/{1.73_m2} — ABNORMAL LOW (ref >59)
GFR, EST NON AFRICAN AMERICAN: 45 mL/min/{1.73_m2} — AB (ref >59)
GLUCOSE: 87 mg/dL (ref 65–99)
POTASSIUM: 4.9 mmol/L (ref 3.5–5.2)
Sodium: 143 mmol/L (ref 134–144)

## 2018-01-05 ENCOUNTER — Telehealth: Payer: Self-pay | Admitting: Internal Medicine

## 2018-01-05 NOTE — Telephone Encounter (Signed)
Attempted to call patient to schedule AWV. Patient did not answer and vm is full could not leave message. SF

## 2018-01-08 ENCOUNTER — Other Ambulatory Visit: Payer: Self-pay | Admitting: Cardiovascular Disease

## 2018-03-05 ENCOUNTER — Telehealth: Payer: Self-pay

## 2018-03-05 NOTE — Telephone Encounter (Signed)
Prior Auth Started through [employer] (Key: [id number])  [employer] has received your information, and the request will be reviewed. You may close this dialog, return to your dashboard, and perform other tasks.  You will receive an electronic determination in CoverMyMeds. You can see the latest determination by locating this request on your dashboard or by reopening this request. You will also receive a faxed copy of the determination. If you have any questions please contact [employer] at [phone].  If you need assistance, please chat with CoverMyMeds or call us at [phone].

## 2018-03-05 NOTE — Telephone Encounter (Signed)
Spoke with Jinny Sanders with [employer].  She stated that all the patients information was correct but it keeps saying "Cannot find matching patient"  She thinks it has something to do with covermymeds system since all information matched correctly. She is going to fax over an urgent request for [place] to get this completed.

## 2018-03-10 NOTE — Telephone Encounter (Signed)
Patient calling to check the status of the PA approval.  Please call

## 2018-03-11 ENCOUNTER — Telehealth: Payer: Self-pay | Admitting: Cardiovascular Disease

## 2018-03-11 ENCOUNTER — Other Ambulatory Visit: Payer: Self-pay | Admitting: *Deleted

## 2018-03-11 DIAGNOSIS — I5022 Chronic systolic (congestive) heart failure: Secondary | ICD-10-CM

## 2018-03-11 MED ORDER — SACUBITRIL-VALSARTAN 24-26 MG PO TABS: 1.0000 | ORAL_TABLET | Freq: Two times a day (BID) | ORAL | 2 refills | Status: DC

## 2018-03-11 NOTE — Telephone Encounter (Signed)
*  STAT* If patient is at the pharmacy, call can be transferred to refill team.   1. Which medications need to be refilled? (please list name of each medication and dose if known) Entresto  2. Which pharmacy/location (including street and city if local pharmacy) is medication to be sent to? [facility]  3. Do they need a 30 day or 90 day supply? 90 day

## 2018-03-11 NOTE — Telephone Encounter (Signed)
  Requested Prescriptions   Signed Prescriptions Disp Refills  . sacubitril-valsartan (ENTRESTO) 24-26 MG 180 tablet 2    Sig: Take 1 tablet by mouth 2 (two) times daily.    Authorizing Provider: Minna Merritts    Ordering User: Britt Bottom

## 2018-03-11 NOTE — Telephone Encounter (Signed)
Requested Prescriptions   Signed Prescriptions Disp Refills  . sacubitril-valsartan (ENTRESTO) 24-26 MG 180 tablet 2    Sig: Take 1 tablet by mouth 2 (two) times daily.    Authorizing Provider: Minna Merritts    Ordering User: Britt Bottom

## 2018-03-13 NOTE — Telephone Encounter (Signed)
Spoke with Arbie Cookey from [employer].  She stated there has been a claim sent through for entresto and they paid $1400 for it.  She stated the patient has already picked it up and only paid $90  After Arbie Cookey was looking through her account she then told me patient did not need a prior auth on this medication and the denial that we received was because they no longer needed any information.

## 2018-03-13 NOTE — Telephone Encounter (Signed)
This was denied and I am not able to determine why based on her chart and documentation. She has been on this medication since 2016 and provider does not want to make any changes because this has been working for her. Let us call her insurance to see if we can review what our next steps will be for this. Please let me know if I can assist with anything.

## 2018-03-14 ENCOUNTER — Other Ambulatory Visit: Payer: Self-pay | Admitting: Cardiovascular Disease

## 2018-03-16 NOTE — Telephone Encounter (Signed)
Refill request came on for Torsemide 10MG   Patients medication list states she takes 20MG    Called patient to verify how she was taking torsemide. Patient stated she takes 20MG  daily except on Sunday I told patient I would change the instructions in medication to how she is currently taking the medication.

## 2018-04-07 ENCOUNTER — Other Ambulatory Visit: Payer: Self-pay | Admitting: Internal Medicine

## 2018-04-07 DIAGNOSIS — F419 Anxiety disorder, unspecified: Secondary | ICD-10-CM

## 2018-04-07 MED ORDER — CITALOPRAM HYDROBROMIDE 10 MG PO TABS: 10.0000 mg | ORAL_TABLET | Freq: Every day | ORAL | 3 refills | Status: DC

## 2018-05-11 ENCOUNTER — Other Ambulatory Visit: Payer: Self-pay | Admitting: Cardiovascular Disease

## 2018-05-20 ENCOUNTER — Other Ambulatory Visit: Payer: Self-pay | Admitting: *Deleted

## 2018-05-20 ENCOUNTER — Telehealth: Payer: Self-pay | Admitting: Cardiovascular Disease

## 2018-05-20 MED ORDER — CLOPIDOGREL BISULFATE 75 MG PO TABS: 75.0000 mg | ORAL_TABLET | Freq: Every day | ORAL | 0 refills | Status: DC

## 2018-05-20 NOTE — Telephone Encounter (Signed)
°*  STAT* If patient is at the pharmacy, call can be transferred to refill team.   1. Which medications need to be refilled? (please list name of each medication and dose if known) Plavix 75 mg po q d   2. Which pharmacy/location (including street and city if local pharmacy) is medication to be sent to? [facility] [address] [facility] [address]   3. Do they need a 30 day or 90 day supply? [place]

## 2018-05-20 NOTE — Telephone Encounter (Signed)
Requested Prescriptions   Signed Prescriptions Disp Refills  . clopidogrel (PLAVIX) 75 MG tablet 90 tablet 0    Sig: Take 1 tablet (75 mg total) by mouth daily.    Authorizing Provider: GOLLAN, TIMOTHY J    Ordering User: LOPEZ, MARINA C    

## 2018-05-28 ENCOUNTER — Telehealth: Payer: Self-pay

## 2018-05-28 NOTE — Telephone Encounter (Signed)
Pt needs to set up prolia injection due 05/13/2018   Juliann Pulse is out of office inform pt but needs to set up shot when she returns   [place]

## 2018-05-28 NOTE — Telephone Encounter (Signed)
Copied from [name] [phone]. Topic: Appointment Scheduling - Scheduling Inquiry for Clinic >> May 28, 2018  2:44 PM Percell Belt A wrote: Reason for CRM:   Pt called in and left a VM- She stated her prolia injection is due this month and would like to know when she can come in and get it?

## 2018-05-28 NOTE — Telephone Encounter (Signed)
Discuss with Juliann Pulse once she returns.

## 2018-05-28 NOTE — Telephone Encounter (Signed)
prolia injection due call to schedule nurse visit   [place]

## 2018-05-29 NOTE — Telephone Encounter (Signed)
Pt called and left another VM to check status.

## 2018-06-01 NOTE — Telephone Encounter (Signed)
Patient called inquiring about her prolia injection that has been overdue since the 11th of March, 2020. Please advise, call back number is [phone]

## 2018-06-01 NOTE — Telephone Encounter (Signed)
Appointment scheduled for tomorrow. Will go out to car and give vaccination

## 2018-06-02 ENCOUNTER — Other Ambulatory Visit: Payer: Self-pay

## 2018-06-02 ENCOUNTER — Ambulatory Visit (INDEPENDENT_AMBULATORY_CARE_PROVIDER_SITE_OTHER): Payer: PPO

## 2018-06-02 DIAGNOSIS — M810 Age-related osteoporosis without current pathological fracture: Secondary | ICD-10-CM | POA: Diagnosis not present

## 2018-06-02 MED ORDER — DENOSUMAB 60 MG/ML ~~LOC~~ SOSY
60.0000 mg | PREFILLED_SYRINGE | Freq: Once | SUBCUTANEOUS | Status: AC
  Administered 2018-06-02: 60 mg via SUBCUTANEOUS

## 2018-06-02 NOTE — Progress Notes (Signed)
[name] presents today for injection per MD orders. Prolia injection* administered SQ in right Upper Arm. Administration without incident. Patient tolerated well.  Davan Nawabi,cma

## 2018-06-09 ENCOUNTER — Other Ambulatory Visit: Payer: Self-pay | Admitting: Cardiovascular Disease

## 2018-06-10 ENCOUNTER — Other Ambulatory Visit: Payer: Self-pay | Admitting: Internal Medicine

## 2018-06-10 DIAGNOSIS — R112 Nausea with vomiting, unspecified: Secondary | ICD-10-CM

## 2018-06-10 MED ORDER — ONDANSETRON 4 MG PO TBDP: ORAL_TABLET | ORAL | 0 refills | Status: DC

## 2018-06-10 NOTE — Telephone Encounter (Signed)
Requested medication (s) are due for refill today: Yes  Requested medication (s) are on the active medication list: Yes  Last refill:  03/26/17  Future visit scheduled: No  Notes to clinic:  Unable to leave message for pt. Checking to see if pt. Currently has symptoms.    Requested Prescriptions  Pending Prescriptions Disp Refills   ondansetron (ZOFRAN-ODT) 4 MG disintegrating tablet 60 tablet 0    Sig: TAKE 1 TABLET BY MOUTH TWICE A DAY AS NEEDED FOR NAUSEA AND VOMITING     Not Delegated - Gastroenterology: Antiemetics Failed - 06/10/2018  9:51 AM      Failed - This refill cannot be delegated      Failed - Valid encounter within last 6 months    Recent Outpatient Visits          1 year ago Elevated serum creatinine   [place] McLean-Scocuzza, Nino Glow, MD   1 year ago Chronic systolic CHF (congestive heart failure) [facility])   [place] McLean-Scocuzza, Nino Glow, MD   1 year ago [place] McLean-Scocuzza, Nino Glow, MD   1 year ago Osteoporosis, unspecified osteoporosis type, unspecified pathological fracture presence   [facility] R[facility], Yvetta Coder, FNP   2 years ago Injury of finger of left hand, initial encounter   [place], [place], [place]

## 2018-06-29 ENCOUNTER — Other Ambulatory Visit: Payer: Self-pay

## 2018-06-29 DIAGNOSIS — R609 Edema, unspecified: Secondary | ICD-10-CM

## 2018-06-29 MED ORDER — POTASSIUM CHLORIDE ER 10 MEQ PO TBCR: 10.0000 meq | EXTENDED_RELEASE_TABLET | Freq: Every day | ORAL | 0 refills | Status: DC

## 2018-06-29 NOTE — Telephone Encounter (Signed)
It looks like a refill was sent in after your message was routed to [place], do you still need assistance? I don't see any alternatives that were included after the message says "below are alternatives"? Any 67meq substitution that is on formulary should be fine if they have recommendations that her insurance will cover.

## 2018-06-29 NOTE — Telephone Encounter (Signed)
Please advise alternatives: Potassium 10 MEQ qd not preferred formulary.  The order you placed, KLOR-CON 10 10 MEQ PO TBCR, might be dispensed as Reordered from potassium chloride (KLOR-CON 10) 10 MEQ tablet, which is not on the preferred formulary for the patient's insurance plan. Below are alternatives which are likely to be more affordable. Do not assume that every medication presented is a clinically appropriate alternative.

## 2018-06-29 NOTE — Telephone Encounter (Signed)
*  STAT* If patient is at the pharmacy, call can be transferred to refill team.   1. Which medications need to be refilled? (please list name of each medication and dose if known) Potassium  2. Which pharmacy/location (including street and city if local pharmacy) is medication to be sent to? [facility]  3. Do they need a 30 day or 90 day supply? [place]

## 2018-06-29 NOTE — Telephone Encounter (Signed)
Will route to [facility] to advise on preferred alternative Potassium.

## 2018-08-02 ENCOUNTER — Other Ambulatory Visit: Payer: Self-pay | Admitting: Cardiovascular Disease

## 2018-08-06 ENCOUNTER — Other Ambulatory Visit: Payer: Self-pay

## 2018-08-06 MED ORDER — METOPROLOL SUCCINATE ER 25 MG PO TB24: ORAL_TABLET | ORAL | 0 refills | Status: DC

## 2018-08-11 ENCOUNTER — Other Ambulatory Visit: Payer: Self-pay | Admitting: Cardiovascular Disease

## 2018-08-14 ENCOUNTER — Telehealth: Payer: Self-pay | Admitting: Cardiovascular Disease

## 2018-08-14 ENCOUNTER — Telehealth: Payer: Self-pay

## 2018-08-14 NOTE — Telephone Encounter (Signed)
Virtual Visit Pre-Appointment Phone Call  "(Name), I am calling you today to discuss your upcoming appointment. We are currently trying to limit exposure to the virus that causes COVID-19 by seeing patients at home rather than in the office."  1. "What is the BEST phone number to call the day of the visit?" - include this in appointment notes  2. "Do you have or have access to (through a family member/friend) a smartphone with video capability that we can use for your visit?" a. If yes - list this number in appt notes as "cell" (if different from BEST phone #) and list the appointment type as a VIDEO visit in appointment notes b. If no - list the appointment type as a PHONE visit in appointment notes  3. Confirm consent - "In the setting of the current Covid19 crisis, you are scheduled for a (phone or video) visit with your provider on (date) at (time).  Just as we do with many in-office visits, in order for you to participate in this visit, we must obtain consent.  If you'd like, I can send this to your mychart (if signed up) or email for you to review.  Otherwise, I can obtain your verbal consent now.  All virtual visits are billed to your insurance company just like a normal visit would be.  By agreeing to a virtual visit, we'd like you to understand that the technology does not allow for your provider to perform an examination, and thus may limit your provider's ability to fully assess your condition. If your provider identifies any concerns that need to be evaluated in person, we will make arrangements to do so.  Finally, though the technology is pretty good, we cannot assure that it will always work on either your or our end, and in the setting of a video visit, we may have to convert it to a phone-only visit.  In either situation, we cannot ensure that we have a secure connection.  Are you willing to proceed?" STAFF: Did the patient verbally acknowledge consent to telehealth visit? Document  YES/NO here: Yes  4.   5. Advise patient to be prepared - "Two hours prior to your appointment, go ahead and check your blood pressure, pulse, oxygen saturation, and your weight (if you have the equipment to check those) and write them all down. When your visit starts, your provider will ask you for this information. If you have an Apple Watch or Kardia device, please plan to have heart rate information ready on the day of your appointment. Please have a pen and paper handy nearby the day of the visit as well."  6. Give patient instructions for MyChart download to smartphone OR Doximity/Doxy.me as below if video visit (depending on what platform provider is using)  7. Inform patient they will receive a phone call 15 minutes prior to their appointment time (may be from unknown caller ID) so they should be prepared to answer    TELEPHONE CALL NOTE  [name] has been deemed a candidate for a follow-up tele-health visit to limit community exposure during the Covid-19 pandemic. I spoke with the patient via phone to ensure availability of phone/video source, confirm preferred email & phone number, and discuss instructions and expectations.  I reminded [name] to be prepared with any vital sign and/or heart rhythm information that could potentially be obtained via home monitoring, at the time of her visit. I reminded [name] to expect a phone  call prior to her visit.  Dolores Lory, [facility] 08/14/2018 5:08 PM   INSTRUCTIONS FOR DOWNLOADING THE MYCHART APP TO SMARTPHONE  - The patient must first make sure to have activated MyChart and know their login information - If Apple, go to [employer] and type in MyChart in the search bar and download the app. If Android, ask patient to go to [employer] and type in [place] in the search bar and download the app. The app is free but as with any other app downloads, their phone may require them to verify saved payment information or  Apple/Android password.  - The patient will need to then log into the app with their MyChart username and password, and select [facility] as their healthcare provider to link the account. When it is time for your visit, go to the MyChart app, find appointments, and click Begin Video Visit. Be sure to Select Allow for your device to access the Microphone and Camera for your visit. You will then be connected, and your provider will be with you shortly.  **If they have any issues connecting, or need assistance please contact MyChart service desk [phone])**  **If using a computer, in order to ensure the best quality for their visit they will need to use either of the following Internet Browsers: [employer], or Google Chrome**  IF USING DOXIMITY or DOXY.ME - The patient will receive a link just prior to their visit by text.     FULL LENGTH CONSENT FOR TELE-HEALTH VISIT   I hereby voluntarily request, consent and authorize [facility] and its employed or contracted physicians, physician assistants, nurse practitioners or other licensed health care professionals (the Practitioner), to provide me with telemedicine health care services (the "Services") as deemed necessary by the treating Practitioner. I acknowledge and consent to receive the Services by the Practitioner via telemedicine. I understand that the telemedicine visit will involve communicating with the Practitioner through live audiovisual communication technology and the disclosure of certain medical information by electronic transmission. I acknowledge that I have been given the opportunity to request an in-person assessment or other available alternative prior to the telemedicine visit and am voluntarily participating in the telemedicine visit.  I understand that I have the right to withhold or withdraw my consent to the use of telemedicine in the course of my care at any time, without affecting my right to future care  or treatment, and that the Practitioner or I may terminate the telemedicine visit at any time. I understand that I have the right to inspect all information obtained and/or recorded in the course of the telemedicine visit and may receive copies of available information for a reasonable fee.  I understand that some of the potential risks of receiving the Services via telemedicine include:  Marland Kitchen Delay or interruption in medical evaluation due to technological equipment failure or disruption; . Information transmitted may not be sufficient (e.g. poor resolution of images) to allow for appropriate medical decision making by the Practitioner; and/or  . In rare instances, security protocols could fail, causing a breach of personal health information.  Furthermore, I acknowledge that it is my responsibility to provide information about my medical history, conditions and care that is complete and accurate to the best of my ability. I acknowledge that Practitioner's advice, recommendations, and/or decision may be based on factors not within their control, such as incomplete or inaccurate data provided by me or distortions of diagnostic images or specimens that may result from  electronic transmissions. I understand that the practice of medicine is not an exact science and that Practitioner makes no warranties or guarantees regarding treatment outcomes. I acknowledge that I will receive a copy of this consent concurrently upon execution via email to the email address I last provided but may also request a printed copy by calling the office of [facility].    I understand that my insurance will be billed for this visit.   I have read or had this consent read to me. . I understand the contents of this consent, which adequately explains the benefits and risks of the Services being provided via telemedicine.  . I have been provided ample opportunity to ask questions regarding this consent and the Services and have had  my questions answered to my satisfaction. . I give my informed consent for the services to be provided through the use of telemedicine in my medical care  By participating in this telemedicine visit I agree to the above.

## 2018-08-14 NOTE — Telephone Encounter (Signed)
Patient notified at her last office visit, Dr. Rockey Situ said to follow up in 6 months.  The patient is agreeable to a virtual doxy.me visit for Monday, August 17, 2018.

## 2018-08-14 NOTE — Telephone Encounter (Signed)
Patient calling States that her prescriptions are only refilling for 30 days and she is requesting 90 days Patient would not schedule an appointment, stating Dr Rockey Situ said that she didn't need to see him unless she needed him  Please advise

## 2018-08-16 NOTE — Progress Notes (Signed)
Virtual Visit via Telephone Note   This visit type was conducted due to national recommendations for restrictions regarding the COVID-19 Pandemic (e.g. social distancing) in an effort to limit this patient's exposure and mitigate transmission in our community.  Due to her co-morbid illnesses, this patient is at least at moderate risk for complications without adequate follow up.  This format is felt to be most appropriate for this patient at this time.  The patient did not have access to video technology/had technical difficulties with video requiring transitioning to audio format only (telephone).  All issues noted in this document were discussed and addressed.  No physical exam could be performed with this format.  Please refer to the patient's chart for her  consent to telehealth for [name].   I connected with  [name] on 08/17/18 by a video enabled telemedicine application and verified that I am speaking with the correct person using two identifiers. I discussed the limitations of evaluation and management by telemedicine. The patient expressed understanding and agreed to proceed.   Evaluation Performed:  Follow-up visit  Date:  08/17/2018   ID:  [name], DOB 1940/07/11, MRN [id number]  Patient Location:  [place] [address]   Provider location:   [facility], [place] office  PCP:  McLean-Scocuzza, Nino Glow, MD  Cardiologist:  Patsy Baltimore   Chief Complaint: Leg weakness    History of Present Illness:    [name] is a 78 y.o. female who presents via audio/video conferencing for a telehealth visit today.   The patient does not symptoms concerning for COVID-19 infection (fever, chills, cough, or new SHORTNESS OF BREATH).   Patient has a past medical history of hepatitis C,  osteoporosis,  arthritis,  chronic back pain s/p multiple fusions,  chronic UTI's,  ischemic cardiomyopathy with ejection fraction 25% on  echocardiogram November 2016, coronary artery disease with PCI to the mid RCA,  proximal LAD August 2016,  Significant medication intolerances Previously not interested in defibrillator or repeat echocardiogram EF in 06/2017, 20 to 25% who presents for routine follow-up of her cardiomyopathy, CAD  Husband does everything She does not walk very much, very sedentary, legs are weak No recent falls Walks with a cane, limited by kyphosis Chronic back pain  No edema, no chest pain  Feels well Tolerating meds  Torsemide 20 mg daily,  entresto once a day, does not think she can handle twice a day   previously got hooked on the pain medication, now doing better Anxiety is better Previously was not taking Celexa on a regular basis  Chronic runny nose   echocardiogram April 2019 showed severely depressed LV function, no improvement from 2018   Other past medical history  She previously felt Plavix was causing her nose to run.  Also Previously she felt Lasix was causing her runny nose.now taking Lasix, not taking torsemide  She was unable to tolerate brilinta She washaving side effects from the entresto, only able to tolerate 1/2 dose of the 24/26 mg pill once a day in the evening. Able to tolerate metoprolol, Lipitor, potassium  She is not interested in defibrillator. We've had previous discussions She did consider applying external defibrillator to put in their house  Other past medical history reviewed [facility] 10/25/14 to 8/26 for new onset acute systolic CHF, ischemic cardiomyopathy, acute respiratory distress with hypoxia, and newly diagnosed CAD s/p PCI/DES to mid RCA and prox LAD.  Admitted to [facility] in August with acute respiraotry destress with hypoxia   Echo August 2016 showed EF of 20%, akinesis of the inferior, inferoseptal, distal anterior, and apical walls. There was hypokinesis elsewhere. Cavity size was mildly dilated. Wall thickness was normal. Mild aortic  regurgitation. Mild to moderate mitral regurgitation. Left atrium was mildly dilated. Right ventricle systolic was was moderate to severely reduced. A small pericardial effusion was seen.   cardiac cath on 8/25 that showed ostial LAD 30%, prox LAD 90%, ostial RCA 30%, mid RCA 90%, ostial Ramus 50%.  She underwent successful PCI/DES to the proximal LAD and mid RCA with Xience DES. She was started on aspirin and Brilinta.   she could not tolerated Brilinta 2/2 SOB. She was changed to Plavix.    Prior CV studies:   The following studies were reviewed today:    Past Medical History:  Diagnosis Date  . Anxiety   . Arthritis   . CAD (coronary artery disease)    a. cardiac cath 10/2014: ostial LAD 30%, prox LAD 90% s/p PCI/DES, ostial RCA 30%, mid RCA 90% s/p PCI/DES, ostial Ramus 50%  . Chronic back pain   . Chronic fatigue   . Chronic systolic CHF (congestive heart failure) ([place])    a. echo 10/2014: EF 20%, AK inf, inf-sep, distal ant, & apical walls, HK elsewhere. Cavity size mildly dilated. Wall thickness nl. mild AI. Mild to mod MR. LA mildly dilated. RV sys fxn mod to severely reduced. small pericardial effusion was ID'd.   . Hepatitis C   . Ischemic cardiomyopathy   . Osteoporosis   . Systolic CHF, chronic ([place])   . UTI (lower urinary tract infection)    Past Surgical History:  Procedure Laterality Date  . BACK SURGERY  07/14   T10, L1  . CARDIAC CATHETERIZATION Bilateral 10/27/2014   Procedure: Left Heart Cath and Coronary Angiography;  Surgeon: Minna Merritts, MD;  Location: [facility];  Service: Cardiovascular;  Laterality: Bilateral;  . CARDIAC CATHETERIZATION N/A 10/27/2014   Procedure: Coronary Stent Intervention;  Surgeon: Wellington Hampshire, MD;  Location: [place] [facility];  Service: Cardiovascular;  Laterality: N/A;     No outpatient medications have been marked as taking for the 08/17/18 encounter (Appointment) with Minna Merritts, MD.      Allergies:   Sugar-protein-starch   Social History   Tobacco Use  . Smoking status: Former Smoker    Packs/day: 1.00    Years: 10.00    Pack years: 10.00  . Smokeless tobacco: Never Used  Substance Use Topics  . Alcohol use: No    Alcohol/week: 0.0 standard drinks  . Drug use: No     Current Outpatient Medications on File Prior to Visit  Medication Sig Dispense Refill  . albuterol (PROVENTIL HFA;VENTOLIN HFA) 108 (90 Base) MCG/ACT inhaler Inhale 2 puffs into the lungs every 6 (six) hours as needed for wheezing or shortness of breath. 1 Inhaler 11  . ALPRAZolam (XANAX) 0.25 MG tablet Qd to bid prn 60 tablet 0  . Calcium-Vitamin A-Vitamin D (LIQUID CALCIUM PO) Take by mouth. 1 tablespoon daily    . cholecalciferol (VITAMIN D) 1000 UNITS tablet Take 1,000 Units by mouth daily.    . citalopram (CELEXA) 10 MG tablet Take 1 tablet (10 mg total) by mouth daily. 90 tablet 3  . clopidogrel (PLAVIX) 75 MG tablet TAKE 1 TABLET BY MOUTH EVERY DAY 30 tablet 0  . denosumab (PROLIA) 60 MG/ML SOSY injection Inject  60 mg into the skin every 6 (six) months. 1 mL 2  . ipratropium (ATROVENT) 0.06 % nasal spray Place 2 sprays into both nostrils 3 (three) times daily. 15 mL 12  . Melatonin 3 MG TABS Take by mouth as needed.    . metoprolol succinate (TOPROL-XL) 25 MG 24 hr tablet TAKE 1 TABLET BY MOUTH EVERYDAY AT BEDTIME 90 tablet 0  . Multiple Vitamins-Minerals (HAIR/SKIN/NAILS/BIOTIN PO) Take by mouth.    . ondansetron (ZOFRAN-ODT) 4 MG disintegrating tablet TAKE 1 TABLET BY MOUTH TWICE A DAY AS NEEDED FOR NAUSEA AND VOMITING 60 tablet 0  . potassium chloride (KLOR-CON 10) 10 MEQ tablet Take 1 tablet (10 mEq total) by mouth daily. Except for Sunday. 90 tablet 0  . rosuvastatin (CRESTOR) 40 MG tablet TAKE 1 TABLET BY MOUTH EVERY DAY 90 tablet 2  . sacubitril-valsartan (ENTRESTO) 24-26 MG Take 1 tablet by mouth 2 (two) times daily. 180 tablet 2  . torsemide (DEMADEX) 20 MG tablet Take 1 tablet (20 mg  total) by mouth daily. Except on Sundays. 90 tablet 3  . torsemide (DEMADEX) 20 MG tablet TAKE 1 TABLET (20MG ) BY MOUTH DAILY EXCEPT FOR SUNDAYS 78 tablet 0   No current facility-administered medications on file prior to visit.      Family Hx: The patient's family history includes Arthritis in her mother; Diabetes in her father; Heart disease in her father.  ROS:   Please see the history of present illness.    Review of Systems  Constitutional: Negative.   HENT: Negative.   Respiratory: Negative.   Cardiovascular: Negative.   Gastrointestinal: Negative.   Musculoskeletal: Negative.   Neurological: Negative.   Psychiatric/Behavioral: Negative.   All other systems reviewed and are negative.    Labs/Other Tests and Data Reviewed:    Recent Labs: 12/31/2017: BUN 25; Creatinine, Ser 1.17; Potassium 4.9; Sodium 143   Recent Lipid Panel Lab Results  Component Value Date/Time   CHOL 128 10/29/2016 09:41 AM   TRIG 144.0 10/29/2016 09:41 AM   HDL 50.40 10/29/2016 09:41 AM   CHOLHDL 3 10/29/2016 09:41 AM   LDLCALC 49 10/29/2016 09:41 AM    Wt Readings from Last 3 Encounters:  12/31/17 115 lb (52.2 kg)  05/19/17 109 lb 8 oz (49.7 kg)  05/14/17 110 lb 3.2 oz (50 kg)     Exam:    Vital Signs: Vital signs may also be detailed in the HPI There were no vitals taken for this visit.  Wt Readings from Last 3 Encounters:  12/31/17 115 lb (52.2 kg)  05/19/17 109 lb 8 oz (49.7 kg)  05/14/17 110 lb 3.2 oz (50 kg)   Temp Readings from Last 3 Encounters:  05/14/17 97.6 F (36.4 C) (Oral)  03/24/17 (!) 97.5 F (36.4 C) (Oral)  03/17/17 (!) 97.5 F (36.4 C) (Oral)   BP Readings from Last 3 Encounters:  12/31/17 140/60  05/19/17 118/60  05/14/17 (!) 96/58   Pulse Readings from Last 3 Encounters:  12/31/17 76  05/19/17 86  05/14/17 69    90/52, pulse 70s resp 16  Well nourished, well developed female in no acute distress. Constitutional:  oriented to person, place, and  time. No distress.    ASSESSMENT & PLAN:    Coronary artery disease of native artery of native heart with stable angina pectoris (HCC) -  Cholesterol is at goal on the current lipid regimen. No changes to the medications were made.  Ischemic cardiomyopathy - EF 20 to 25% Stay on  same meds She does want ICD She did not want repeat echocardiogram, declining further medication titration  Chronic systolic CHF (congestive heart failure) ([place]) -  Euvolemic Will recommend repeat lab work when she follows up with primary care  Mixed hyperlipidemia -  Numbers at goal in the past no changes made  Essential hypertension -  Blood pressure low but she is asymptomatic  Anxiety -  Reports her anxiety is well controlled  Edema, unspecified type -  No edema, reports she is euvolemic   COVID-19 Education: The signs and symptoms of COVID-19 were discussed with the patient and how to seek care for testing (follow up with PCP or arrange E-visit).  The importance of social distancing was discussed today.  Patient Risk:   After full review of this patients clinical status, I feel that they are at least moderate risk at this time.  Time:   Today, I have spent 25 minutes with the patient with telehealth technology discussing the cardiac and medical problems/diagnoses detailed above   10 min spent reviewing the chart prior to patient visit today   Medication Adjustments/Labs and Tests Ordered: Current medicines are reviewed at length with the patient today.  Concerns regarding medicines are outlined above.   Tests Ordered: No tests ordered   Medication Changes: No changes made   Disposition: Follow-up in 6 months   Signed, Ida Rogue, MD  08/17/2018 11:22 AM    [facility] [address] [place] [address], [place], [place] [address]

## 2018-08-17 ENCOUNTER — Other Ambulatory Visit: Payer: Self-pay

## 2018-08-17 ENCOUNTER — Telehealth (INDEPENDENT_AMBULATORY_CARE_PROVIDER_SITE_OTHER): Payer: PPO | Admitting: Cardiovascular Disease

## 2018-08-17 DIAGNOSIS — F419 Anxiety disorder, unspecified: Secondary | ICD-10-CM

## 2018-08-17 DIAGNOSIS — E782 Mixed hyperlipidemia: Secondary | ICD-10-CM | POA: Diagnosis not present

## 2018-08-17 DIAGNOSIS — I25118 Atherosclerotic heart disease of native coronary artery with other forms of angina pectoris: Secondary | ICD-10-CM

## 2018-08-17 DIAGNOSIS — I5022 Chronic systolic (congestive) heart failure: Secondary | ICD-10-CM | POA: Diagnosis not present

## 2018-08-17 DIAGNOSIS — R609 Edema, unspecified: Secondary | ICD-10-CM

## 2018-08-17 DIAGNOSIS — I255 Ischemic cardiomyopathy: Secondary | ICD-10-CM

## 2018-08-17 DIAGNOSIS — I10 Essential (primary) hypertension: Secondary | ICD-10-CM | POA: Diagnosis not present

## 2018-08-17 MED ORDER — METOPROLOL SUCCINATE ER 25 MG PO TB24: ORAL_TABLET | ORAL | 3 refills | Status: DC

## 2018-08-17 MED ORDER — TORSEMIDE 20 MG PO TABS: 20.0000 mg | ORAL_TABLET | Freq: Every day | ORAL | 3 refills | Status: DC

## 2018-08-17 MED ORDER — POTASSIUM CHLORIDE ER 10 MEQ PO CPCR: 10.0000 meq | ORAL_CAPSULE | Freq: Every day | ORAL | 3 refills | Status: DC

## 2018-08-17 MED ORDER — ENTRESTO 24-26 MG PO TABS: 1.0000 | ORAL_TABLET | Freq: Two times a day (BID) | ORAL | 3 refills | Status: DC

## 2018-08-17 MED ORDER — CLOPIDOGREL BISULFATE 75 MG PO TABS: 75.0000 mg | ORAL_TABLET | Freq: Every day | ORAL | 3 refills | Status: DC

## 2018-08-17 MED ORDER — ROSUVASTATIN CALCIUM 40 MG PO TABS: 40.0000 mg | ORAL_TABLET | Freq: Every day | ORAL | 3 refills | Status: DC

## 2018-08-17 NOTE — Patient Instructions (Addendum)
Medication Instructions:  Refills sent into your pharmacy.  If you need a refill on your cardiac medications before your next appointment, please call your pharmacy.    Lab work: No new labs needed   If you have labs (blood work) drawn today and your tests are completely normal, you will receive your results only by: Marland Kitchen MyChart Message (if you have MyChart) OR . A paper copy in the mail If you have any lab test that is abnormal or we need to change your treatment, we will call you to review the results.   Testing/Procedures: No new testing needed   Follow-Up: At [facility], you and your health needs are our priority.  As part of our continuing mission to provide you with exceptional heart care, we have created designated Provider Care Teams.  These Care Teams include your primary Cardiologist (physician) and Advanced Practice Providers (APPs -  Physician Assistants and Nurse Practitioners) who all work together to provide you with the care you need, when you need it.  . You will need a follow up appointment in 12 months .   Please call our office 2 months in advance to schedule this appointment.    . Providers on your designated Care Team:   . Murray Hodgkins, NP . Christell Faith, PA-C . Marrianne Mood, PA-C  Any Other Special Instructions Will Be Listed Below (If Applicable).  For educational health videos Log in to : [email or web address] Or : [email or web address], password : [email or web address]

## 2018-08-21 ENCOUNTER — Other Ambulatory Visit: Payer: Self-pay | Admitting: Internal Medicine

## 2018-08-21 DIAGNOSIS — J310 Chronic rhinitis: Secondary | ICD-10-CM

## 2018-08-21 MED ORDER — IPRATROPIUM BROMIDE 0.06 % NA SOLN: 2.0000 | Freq: Three times a day (TID) | NASAL | 12 refills | Status: DC

## 2018-09-14 ENCOUNTER — Other Ambulatory Visit: Payer: Self-pay | Admitting: Cardiovascular Disease

## 2018-09-14 DIAGNOSIS — R609 Edema, unspecified: Secondary | ICD-10-CM

## 2018-09-14 NOTE — Telephone Encounter (Signed)
Please advise correct potassium to fill. Potassium 10 meq qd except for Sunday not on pt's preferred drug. Please see prompts when refilled.

## 2018-10-02 ENCOUNTER — Other Ambulatory Visit: Payer: Self-pay

## 2018-11-10 ENCOUNTER — Telehealth: Payer: Self-pay | Admitting: *Deleted

## 2018-11-10 NOTE — Telephone Encounter (Signed)
Copied from [place]. Topic: General - Other >> Nov 10, 2018 11:19 AM [name] wrote: Reason for CRM: pt want to speak to [facility] concerning her prolia shot. Please call t

## 2018-11-10 NOTE — Telephone Encounter (Signed)
Patient has been scheduled . Awaiting insurance approval.

## 2018-11-11 ENCOUNTER — Other Ambulatory Visit: Payer: Self-pay

## 2018-11-11 MED ORDER — TORSEMIDE 20 MG PO TABS: 20.0000 mg | ORAL_TABLET | Freq: Every day | ORAL | 0 refills | Status: DC

## 2018-11-17 ENCOUNTER — Telehealth: Payer: Self-pay | Admitting: Internal Medicine

## 2018-11-17 ENCOUNTER — Other Ambulatory Visit: Payer: Self-pay | Admitting: Internal Medicine

## 2018-11-17 DIAGNOSIS — R112 Nausea with vomiting, unspecified: Secondary | ICD-10-CM

## 2018-11-17 MED ORDER — ONDANSETRON 4 MG PO TBDP: ORAL_TABLET | ORAL | 0 refills | Status: DC

## 2018-11-17 NOTE — Telephone Encounter (Signed)
Call pt needs to make appt has not been seen in >1 year

## 2018-12-03 ENCOUNTER — Other Ambulatory Visit: Payer: Self-pay

## 2018-12-03 ENCOUNTER — Ambulatory Visit (INDEPENDENT_AMBULATORY_CARE_PROVIDER_SITE_OTHER): Payer: PPO

## 2018-12-03 DIAGNOSIS — M810 Age-related osteoporosis without current pathological fracture: Secondary | ICD-10-CM | POA: Diagnosis not present

## 2018-12-03 MED ORDER — DENOSUMAB 60 MG/ML ~~LOC~~ SOSY
60.0000 mg | PREFILLED_SYRINGE | Freq: Once | SUBCUTANEOUS | Status: AC
  Administered 2018-12-03: 60 mg via SUBCUTANEOUS

## 2018-12-03 NOTE — Progress Notes (Signed)
Patient presented today for Prolia injection. Administered SQ in left upper arm.  Patient tolerated well with no signs of distress.    Patient was given a reminder card to schedule next injection appointment in 6 months.

## 2018-12-03 NOTE — Telephone Encounter (Signed)
Approved.  

## 2018-12-28 ENCOUNTER — Other Ambulatory Visit: Payer: Self-pay

## 2018-12-28 ENCOUNTER — Ambulatory Visit (INDEPENDENT_AMBULATORY_CARE_PROVIDER_SITE_OTHER): Payer: PPO

## 2018-12-28 DIAGNOSIS — Z0000 Encounter for general adult medical examination without abnormal findings: Secondary | ICD-10-CM

## 2018-12-28 NOTE — Patient Instructions (Addendum)
  [name] , Thank you for taking time to come for your Medicare Wellness Visit. I appreciate your ongoing commitment to your health goals. Please review the following plan we discussed and let me know if I can assist you in the future.   These are the goals we discussed: Goals    . Increase physical activity       This is a list of the screening recommended for you and due dates:  Health Maintenance  Topic Date Due  . Tetanus Vaccine  12/28/2019*  . Flu Shot  Completed  . DEXA scan (bone density measurement)  Completed  . Pneumonia vaccines  Completed  *Topic was postponed. The date shown is not the original due date.

## 2018-12-28 NOTE — Progress Notes (Signed)
Subjective:   [name] is a 78 y.o. female who presents for an Initial Medicare Annual Wellness Visit.  Review of Systems    No ROS.  Medicare Wellness Virtual Visit.  Visual/audio telehealth visit, UTA vital signs.   See social history for additional risk factors.   Cardiac Risk Factors include: advanced age (>109men, >43 women);hypertension     Objective:    Today's Vitals   There is no height or weight on file to calculate BMI.  Advanced Directives 12/28/2018 11/03/2014 10/25/2014  Does Patient Have a Medical Advance Directive? Yes Yes No  Type of Paramedic of [place];Living will [place];Living will -  Does patient want to make changes to medical advance directive? No - Patient declined No - Patient declined -  Copy of [place] in Chart? No - copy requested No - copy requested -  Would patient like information on creating a medical advance directive? - - No - patient declined information    Current Medications (verified) Outpatient Encounter Medications as of 12/28/2018  Medication Sig  . albuterol (PROVENTIL HFA;VENTOLIN HFA) 108 (90 Base) MCG/ACT inhaler Inhale 2 puffs into the lungs every 6 (six) hours as needed for wheezing or shortness of breath.  . ALPRAZolam (XANAX) 0.25 MG tablet Qd to bid prn  . Calcium-Vitamin A-Vitamin D (LIQUID CALCIUM PO) Take by mouth. 1 tablespoon daily  . cholecalciferol (VITAMIN D) 1000 UNITS tablet Take 1,000 Units by mouth daily.  . citalopram (CELEXA) 10 MG tablet Take 1 tablet (10 mg total) by mouth daily.  . clopidogrel (PLAVIX) 75 MG tablet Take 1 tablet (75 mg total) by mouth daily.  Marland Kitchen denosumab (PROLIA) 60 MG/ML SOSY injection Inject 60 mg into the skin every 6 (six) months.  Marland Kitchen ipratropium (ATROVENT) 0.06 % nasal spray Place 2 sprays into both nostrils 3 (three) times daily.  . Melatonin 3 MG TABS Take by mouth as needed.  . metoprolol succinate (TOPROL-XL) 25 MG  24 hr tablet TAKE 1 TABLET BY MOUTH EVERYDAY AT BEDTIME  . Multiple Vitamins-Minerals (HAIR/SKIN/NAILS/BIOTIN PO) Take by mouth.  . ondansetron (ZOFRAN-ODT) 4 MG disintegrating tablet TAKE 1 TABLET BY MOUTH TWICE A DAY AS NEEDED FOR NAUSEA AND VOMITING appt further refills no exceptions  . potassium chloride (KLOR-CON M10) 10 MEQ tablet Take 1 tablet (10 mEq total) by mouth daily. Except for Sunday  . potassium chloride (MICRO-K) 10 MEQ CR capsule Take 1 capsule (10 mEq total) by mouth daily. Except for Sunday  . rosuvastatin (CRESTOR) 40 MG tablet Take 1 tablet (40 mg total) by mouth daily at 6 PM.  . sacubitril-valsartan (ENTRESTO) 24-26 MG Take 1 tablet by mouth 2 (two) times daily.  Marland Kitchen torsemide (DEMADEX) 20 MG tablet TAKE 1 TABLET (20MG ) BY MOUTH DAILY EXCEPT FOR SUNDAYS  . torsemide (DEMADEX) 20 MG tablet Take 1 tablet (20 mg total) by mouth daily. Except on Sundays.   No facility-administered encounter medications on file as of 12/28/2018.     Allergies (verified) Sugar-protein-starch   History: Past Medical History:  Diagnosis Date  . Anxiety   . Arthritis   . CAD (coronary artery disease)    a. cardiac cath 10/2014: ostial LAD 30%, prox LAD 90% s/p PCI/DES, ostial RCA 30%, mid RCA 90% s/p PCI/DES, ostial Ramus 50%  . Chronic back pain   . Chronic fatigue   . Chronic systolic CHF (congestive heart failure) ([place])    a. echo 10/2014: EF 20%, AK inf,  inf-sep, distal ant, & apical walls, HK elsewhere. Cavity size mildly dilated. Wall thickness nl. mild AI. Mild to mod MR. LA mildly dilated. RV sys fxn mod to severely reduced. small pericardial effusion was ID'd.   . Hepatitis C   . Ischemic cardiomyopathy   . Osteoporosis   . Systolic CHF, chronic ([place])   . UTI (lower urinary tract infection)    Past Surgical History:  Procedure Laterality Date  . BACK SURGERY  07/14   T10, L1  . CARDIAC CATHETERIZATION Bilateral 10/27/2014   Procedure: Left Heart Cath and Coronary Angiography;   Surgeon: Minna Merritts, MD;  Location: [facility];  Service: Cardiovascular;  Laterality: Bilateral;  . CARDIAC CATHETERIZATION N/A 10/27/2014   Procedure: Coronary Stent Intervention;  Surgeon: Wellington Hampshire, MD;  Location: [facility];  Service: Cardiovascular;  Laterality: N/A;   Family History  Problem Relation Age of Onset  . Arthritis Mother   . Heart disease Father        passed at 2  . Diabetes Father    Social History   Socioeconomic History  . Marital status: Married    Spouse name: [name]  . Number of children: 0  . Years of education: 12  . Highest education level: Not on file  Occupational History  . Occupation: Receptionist    Comment: Retired in [employer]  . Financial resource strain: Not hard at all  . Food insecurity    Worry: Never true    Inability: Never true  . Transportation needs    Medical: No    Non-medical: No  Tobacco Use  . Smoking status: Former Smoker    Packs/day: 1.00    Years: 10.00    Pack years: 10.00  . Smokeless tobacco: Never Used  Substance and Sexual Activity  . Alcohol use: No    Alcohol/week: 0.0 standard drinks  . Drug use: No  . Sexual activity: Yes    Birth control/protection: Post-menopausal  Lifestyle  . Physical activity    Days per week: 1 day    Minutes per session: 20 min  . Stress: Not at all  Relationships  . Social Herbalist on phone: Not on file    Gets together: Not on file    Attends religious service: Not on file    Active member of club or organization: Not on file    Attends meetings of clubs or organizations: Not on file    Relationship status: Not on file  Other Topics Concern  . Not on file  Social History Narrative   Pt is 78 yo female, married to husband of 21 years.  Pt has no children.  Pt lives with husband and teacup poodle, [name]. Pt states she hurt her back last year and has not been able to participate in much physical activity since. Pt  enjoys traveling with her husband in motor home, doing crossword puzzles with husband, and playing cards with friends.  Pt was raised in [facility] and has lived in [place] entire adult life.  Pt worked in [employer] in [place] until [place] when they closed.      Tobacco Counseling Counseling given: Not Answered   Clinical Intake:  Pre-visit preparation completed: Yes        Diabetes: No  How often do you need to have someone help you when you read instructions, pamphlets, or other written materials from your doctor or pharmacy?: 1 -  Never  Interpreter Needed?: No      Activities of Daily Living In your present state of health, do you have any difficulty performing the following activities: 12/28/2018  Hearing? N  Vision? N  Difficulty concentrating or making decisions? N  Walking or climbing stairs? Y  Comment Walker/cane in use  Dressing or bathing? N  Doing errands, shopping? N  Preparing Food and eating ? Y  Comment Husband prepares meals. Self feeds.  Using the Toilet? N  In the past six months, have you accidently leaked urine? N  Do you have problems with loss of bowel control? N  Managing your Medications? N  Managing your Finances? N  Housekeeping or managing your Housekeeping? Y  Comment Husband manages finances.  Some recent data might be hidden     Immunizations and Health Maintenance Immunization History  Administered Date(s) Administered  . Influenza Split 11/05/2012, 11/16/2013  . Influenza, High Dose Seasonal PF 11/20/2016, 11/12/2017, 11/19/2018  . Influenza-Unspecified 11/23/2014  . Pneumococcal Conjugate-13 02/06/2015  . Pneumococcal Polysaccharide-23 03/04/2010, 05/14/2017   There are no preventive care reminders to display for this patient.  Patient Care Team: McLean-Scocuzza, Nino Glow, MD as PCP - General (Internal Medicine) Alisa Graff, FNP as Nurse Practitioner (Cardiology) Minna Merritts, MD as Consulting Physician  (Cardiology)  Indicate any recent Medical Services you may have received from other than [facility] providers in the past year (date may be approximate).     Assessment:   This is a routine wellness examination for Ch[place].  Nurse connected with patient 12/28/18 at 12:00 PM EDT by a telephone enabled telemedicine application and verified that I am speaking with the correct person using two identifiers. Patient stated full name and DOB. Patient gave permission to continue with virtual visit. Patient's location was at home and Nurse's location was at [place] office.   Health Maintenance Due: See completed HM at the end of note.   Eye: Visual acuity not assessed. Virtual visit. Wears corrective lenses. Followed by their ophthalmologist every 12 months.   Dental: Visits every 3 months.    Hearing: Demonstrates normal hearing during visit.  Safety:  Patient feels safe at home- yes Patient does have smoke detectors at home- yes Patient does wear sunscreen or protective clothing when in direct sunlight - yes Patient does wear seat belt when in a moving vehicle - yes Patient drives- no often Adequate lighting in walkways free from debris- yes Grab bars and handrails used as appropriate- yes Ambulates with walker/cane as an assistive device as needed  Social: Alcohol intake - no   Smoking history- former   Smokers in home? none Illicit drug use? none  Depression: PHQ 2 &9 complete. See screening below. Denies irritability, anhedonia, sadness/tearfullness.  Stable.   Falls: See screening below.    Medication: Taking as directed and without issues.   Covid-19: Precautions and sickness symptoms discussed. Wears mask, social distancing, hand hygiene as appropriate.   Activities of Daily Living Patient denies needing assistance with: feeding themselves, getting from bed to chair, getting to the toilet, bathing/showering, dressing, managing money. Assisted by husband with household  chores, and meal prep.  Memory: Patient is alert. Patient denies difficulty focusing or concentrating. Correctly identified the president of the [place], season and recall. Patient likes to play crossword puzzles for brain stimulation.   BMI- discussed the importance of a healthy diet, water intake and the benefits of aerobic exercise.  Educational material provided.  Physical activity- treadmill, no routine  Diet:  Regular Water: good intake  Other Providers Patient Care Team: McLean-Scocuzza, Nino Glow, MD as PCP - General (Internal Medicine) Alisa Graff, FNP as Nurse Practitioner (Cardiology) Minna Merritts, MD as Consulting Physician (Cardiology)   Hearing/Vision screen  Hearing Screening   125Hz  250Hz  500Hz  1000Hz  2000Hz  3000Hz  4000Hz  6000Hz  8000Hz   Right ear:           Left ear:           Comments: Patient is able to hear conversational tones without difficulty.  No issues reported.  Vision Screening Comments: Wears corrective lenses Visual acuity not assessed, virtual visit.  They have seen their ophthalmologist in the last 12 months.     Dietary issues and exercise activities discussed: Current Exercise Habits: Home exercise routine, Type of exercise: treadmill, Intensity: Mild  Goals    . Increase physical activity      Depression Screen PHQ 2/9 Scores 12/28/2018 05/14/2017 11/18/2016 08/04/2015 11/03/2014 03/22/2014  PHQ - 2 Score 0 0 0 0 0 0    Fall Risk Fall Risk  12/28/2018 05/14/2017 11/18/2016 08/04/2015 11/03/2014  Falls in the past year? 0 No No No No  Number falls in past yr: 0 - - - -  Risk for fall due to : - - - - Impaired balance/gait    Timed Get Up and Go Performed no, virtual visit  Cognitive Function:     6CIT Screen 12/28/2018  What Year? 0 points  What month? 0 points  What time? 0 points  Count back from 20 0 points  Months in reverse 0 points  Repeat phrase 0 points  Total Score 0    Screening Tests Health Maintenance  Topic Date  Due  . TETANUS/TDAP  12/28/2019 (Originally 09/22/1959)  . INFLUENZA VACCINE  Completed  . DEXA SCAN  Completed  . PNA vac Low Risk Adult  Completed     Plan:   Keep all routine maintenance appointments.   Medicare Attestation I have personally reviewed: The patient's medical and social history Their use of alcohol, tobacco or illicit drugs Their current medications and supplements The patient's functional ability including ADLs,fall risks, home safety risks, cognitive, and hearing and visual impairment Diet and physical activities Evidence for depression   In addition, I have reviewed and discussed with patient certain preventive protocols, quality metrics, and best practice recommendations. A written personalized care plan for preventive services as well as general preventive health recommendations were provided to patient via mail.     Varney Biles, LPN   [id number]

## 2018-12-30 DIAGNOSIS — H2513 Age-related nuclear cataract, bilateral: Secondary | ICD-10-CM | POA: Diagnosis not present

## 2018-12-30 DIAGNOSIS — H5203 Hypermetropia, bilateral: Secondary | ICD-10-CM | POA: Diagnosis not present

## 2018-12-30 DIAGNOSIS — H524 Presbyopia: Secondary | ICD-10-CM | POA: Diagnosis not present

## 2018-12-30 DIAGNOSIS — H52223 Regular astigmatism, bilateral: Secondary | ICD-10-CM | POA: Diagnosis not present

## 2019-01-01 ENCOUNTER — Ambulatory Visit (INDEPENDENT_AMBULATORY_CARE_PROVIDER_SITE_OTHER): Payer: PPO | Admitting: Internal Medicine

## 2019-01-01 ENCOUNTER — Encounter: Payer: Self-pay | Admitting: Internal Medicine

## 2019-01-01 ENCOUNTER — Other Ambulatory Visit: Payer: Self-pay

## 2019-01-01 VITALS — BP 101/65 | HR 89 | Ht 60.0 in | Wt 125.0 lb

## 2019-01-01 DIAGNOSIS — I5022 Chronic systolic (congestive) heart failure: Secondary | ICD-10-CM | POA: Diagnosis not present

## 2019-01-01 DIAGNOSIS — Z1389 Encounter for screening for other disorder: Secondary | ICD-10-CM

## 2019-01-01 DIAGNOSIS — E559 Vitamin D deficiency, unspecified: Secondary | ICD-10-CM | POA: Diagnosis not present

## 2019-01-01 DIAGNOSIS — J310 Chronic rhinitis: Secondary | ICD-10-CM | POA: Diagnosis not present

## 2019-01-01 DIAGNOSIS — F419 Anxiety disorder, unspecified: Secondary | ICD-10-CM

## 2019-01-01 DIAGNOSIS — I10 Essential (primary) hypertension: Secondary | ICD-10-CM

## 2019-01-01 DIAGNOSIS — I25118 Atherosclerotic heart disease of native coronary artery with other forms of angina pectoris: Secondary | ICD-10-CM

## 2019-01-01 DIAGNOSIS — Z1329 Encounter for screening for other suspected endocrine disorder: Secondary | ICD-10-CM

## 2019-01-01 DIAGNOSIS — E782 Mixed hyperlipidemia: Secondary | ICD-10-CM | POA: Diagnosis not present

## 2019-01-01 DIAGNOSIS — M810 Age-related osteoporosis without current pathological fracture: Secondary | ICD-10-CM

## 2019-01-01 MED ORDER — IPRATROPIUM BROMIDE 0.06 % NA SOLN: 2.0000 | Freq: Three times a day (TID) | NASAL | 12 refills | Status: DC

## 2019-01-01 MED ORDER — CITALOPRAM HYDROBROMIDE 10 MG PO TABS: 10.0000 mg | ORAL_TABLET | Freq: Every day | ORAL | 3 refills | Status: DC

## 2019-01-01 MED ORDER — ALPRAZOLAM 0.25 MG PO TABS: ORAL_TABLET | ORAL | 0 refills | Status: DC

## 2019-01-01 NOTE — Progress Notes (Signed)
Telephone Note  I connected with [name]  on 01/01/19 at  1:30 PM EDT by telephone and verified that I am speaking with the correct person using two identifiers.  Location patient: home Location provider:work or home office Persons participating in the virtual visit: patient, provider, pts husband  I discussed the limitations of evaluation and management by telemedicine and the availability of in person appointments. The patient expressed understanding and agreed to proceed.   HPI: 1. HTNon toprol xl 25 mg qd, entresto 24-26, torsemide 20 mg qd except sundays and K 10 qd 2. CHF/CAD on entresto BB, crestor, plavix and denies CP, sob  3. Anxiety on celexa 10 mg qd and not taking xanax 0.25 mg qd   ROS: See pertinent positives and negatives per HPI. General: energy ok  GI: denies dysphagia  CV: denies CP Lungs: denies sob  GI: denies blood in stool  Neuro: denies dizziness  MSK walks with walker/cane 2/2 chronic back pain denies leg weakness   Past Medical History:  Diagnosis Date  . Anxiety   . Arthritis   . CAD (coronary artery disease)    a. cardiac cath 10/2014: ostial LAD 30%, prox LAD 90% s/p PCI/DES, ostial RCA 30%, mid RCA 90% s/p PCI/DES, ostial Ramus 50%  . Chronic back pain   . Chronic fatigue   . Chronic systolic CHF (congestive heart failure) ([place])    a. echo 10/2014: EF 20%, AK inf, inf-sep, distal ant, & apical walls, HK elsewhere. Cavity size mildly dilated. Wall thickness nl. mild AI. Mild to mod MR. LA mildly dilated. RV sys fxn mod to severely reduced. small pericardial effusion was ID'd.   . Hepatitis C   . Ischemic cardiomyopathy   . Osteoporosis   . Systolic CHF, chronic ([place])   . UTI (lower urinary tract infection)     Past Surgical History:  Procedure Laterality Date  . BACK SURGERY  07/14   T10, L1  . CARDIAC CATHETERIZATION Bilateral 10/27/2014   Procedure: Left Heart Cath and Coronary Angiography;  Surgeon: Minna Merritts, MD;  Location: [facility];  Service: Cardiovascular;  Laterality: Bilateral;  . CARDIAC CATHETERIZATION N/A 10/27/2014   Procedure: Coronary Stent Intervention;  Surgeon: Wellington Hampshire, MD;  Location: [facility];  Service: Cardiovascular;  Laterality: N/A;    Family History  Problem Relation Age of Onset  . Arthritis Mother   . Heart disease Father        passed at 65  . Diabetes Father     SOCIAL HX: Pt is 78 yo female, married to husband of 21 years.  Pt has no children.  Pt lives with husband and teacup poodle, [name]. Pt states she hurt her back last year and has not been able to participate in much physical activity since. Pt enjoys traveling with her husband in motor home, doing crossword puzzles with husband, and playing cards with friends.  Pt was raised in [facility] and has lived in [place] entire adult life.  Pt worked in [employer] in [place] until [place] when they closed.     Current Outpatient Medications:  .  ALPRAZolam (XANAX) 0.25 MG tablet, Qd to bid prn, Disp: 60 tablet, Rfl: 0 .  Calcium-Vitamin A-Vitamin D (LIQUID CALCIUM PO), Take by mouth. 1 tablespoon daily, Disp: , Rfl:  .  cholecalciferol (VITAMIN D) 1000 UNITS tablet, Take 1,000 Units by mouth daily., Disp: , Rfl:  .  citalopram (CELEXA) 10 MG tablet, Take  1 tablet (10 mg total) by mouth daily., Disp: 90 tablet, Rfl: 3 .  clopidogrel (PLAVIX) 75 MG tablet, Take 1 tablet (75 mg total) by mouth daily., Disp: 90 tablet, Rfl: 3 .  denosumab (PROLIA) 60 MG/ML SOSY injection, Inject 60 mg into the skin every 6 (six) months., Disp: 1 mL, Rfl: 2 .  ipratropium (ATROVENT) 0.06 % nasal spray, Place 2 sprays into both nostrils 3 (three) times daily., Disp: 15 mL, Rfl: 12 .  Melatonin 3 MG TABS, Take by mouth as needed., Disp: , Rfl:  .  metoprolol succinate (TOPROL-XL) 25 MG 24 hr tablet, TAKE 1 TABLET BY MOUTH EVERYDAY AT BEDTIME, Disp: 90 tablet, Rfl: 3 .  Multiple Vitamins-Minerals (HAIR/SKIN/NAILS/BIOTIN PO),  Take by mouth., Disp: , Rfl:  .  ondansetron (ZOFRAN-ODT) 4 MG disintegrating tablet, TAKE 1 TABLET BY MOUTH TWICE A DAY AS NEEDED FOR NAUSEA AND VOMITING appt further refills no exceptions, Disp: 60 tablet, Rfl: 0 .  potassium chloride (KLOR-CON M10) 10 MEQ tablet, Take 1 tablet (10 mEq total) by mouth daily. Except for Sunday, Disp: 90 tablet, Rfl: 1 .  potassium chloride (MICRO-K) 10 MEQ CR capsule, Take 1 capsule (10 mEq total) by mouth daily. Except for Sunday, Disp: 90 capsule, Rfl: 3 .  rosuvastatin (CRESTOR) 40 MG tablet, Take 1 tablet (40 mg total) by mouth daily at 6 PM., Disp: 90 tablet, Rfl: 3 .  sacubitril-valsartan (ENTRESTO) 24-26 MG, Take 1 tablet by mouth 2 (two) times daily., Disp: 180 tablet, Rfl: 3 .  torsemide (DEMADEX) 20 MG tablet, TAKE 1 TABLET (20MG ) BY MOUTH DAILY EXCEPT FOR SUNDAYS, Disp: 78 tablet, Rfl: 0 .  torsemide (DEMADEX) 20 MG tablet, Take 1 tablet (20 mg total) by mouth daily. Except on Sundays., Disp: 90 tablet, Rfl: 0 .  albuterol (PROVENTIL HFA;VENTOLIN HFA) 108 (90 Base) MCG/ACT inhaler, Inhale 2 puffs into the lungs every 6 (six) hours as needed for wheezing or shortness of breath. (Patient not taking: Reported on 01/01/2019), Disp: 1 Inhaler, Rfl: 11  EXAM: before failed video   VITALS per patient if applicable:  GENERAL: alert, oriented, appears well and in no acute distress  HEENT: atraumatic, conjunttiva clear, no obvious abnormalities on inspection of external nose and ears  NECK: normal movements of the head and neck  LUNGS: on inspection no signs of respiratory distress, breathing rate appears normal, no obvious gross SOB, gasping or wheezing  CV: no obvious cyanosis  MS: moves all visible extremities without noticeable abnormality  PSYCH/NEURO: pleasant and cooperative, no obvious depression or anxiety, speech and thought processing grossly intact  ASSESSMENT AND PLAN:  Discussed the following assessment and plan:  Essential  hypertension - Plan: Comprehensive metabolic panel, CBC with Differential/Platelet, Lipid panel -cont meds   Osteoporosis - Plan: DG Bone Density on prolia   Coronary artery disease of native artery of native heart with stable angina pectoris (HCC) Mixed hyperlipidemia - Plan: Lipid panel Chronic systolic CHF (congestive heart failure) ([place]) - Plan: Comprehensive metabolic panel, Lipid panel, Magnesium -f/u with [facility] Thyroid disorder screening - Plan: TSH  Vitamin D deficiency - Plan: Vitamin D (25 hydroxy)  Anxiety - Plan: ALPRAZolam (XANAX) 0.25 MG tablet not taking, citalopram (CELEXA) 10 MG tablet  Rhinitis, unspecified type - Plan: ipratropium (ATROVENT) 0.06 % nasal spray  HM Had flu shot utd 2020  pna 23 updated  Had prevnar Declines Tdap  Consider shingrix in future declines  Consider fobt vs cologuard declines  colonoscopy 2003 declines further   mammo  declines last had years ago not in the area and declines  Pap out of age window  DEXA 01/31/16 osteoporosis + consider check vit D in future try to add on today. On Prolia. Will need to disc calcium and vit D supplementation in future make sure dose is adequate hep c neg 10/29/16    -we discussed possible serious and likely etiologies, options for evaluation and workup, limitations of telemedicine visit vs in person visit, treatment, treatment risks and precautions. Pt prefers to treat via telemedicine empirically rather then risking or undertaking an in person visit at this moment. Patient agrees to seek prompt in person care if worsening, new symptoms arise, or if is not improving with treatment.   I discussed the assessment and treatment plan with the patient. The patient was provided an opportunity to ask questions and all were answered. The patient agreed with the plan and demonstrated an understanding of the instructions.   The patient was advised to call back or seek an in-person evaluation if the symptoms worsen  or if the condition fails to improve as anticipated.  Time 25 minutes  Delorise Jackson, MD

## 2019-01-05 ENCOUNTER — Other Ambulatory Visit (INDEPENDENT_AMBULATORY_CARE_PROVIDER_SITE_OTHER): Payer: PPO

## 2019-01-05 ENCOUNTER — Other Ambulatory Visit: Payer: Self-pay

## 2019-01-05 DIAGNOSIS — I10 Essential (primary) hypertension: Secondary | ICD-10-CM | POA: Diagnosis not present

## 2019-01-05 DIAGNOSIS — Z1389 Encounter for screening for other disorder: Secondary | ICD-10-CM

## 2019-01-05 DIAGNOSIS — E559 Vitamin D deficiency, unspecified: Secondary | ICD-10-CM | POA: Diagnosis not present

## 2019-01-05 DIAGNOSIS — E782 Mixed hyperlipidemia: Secondary | ICD-10-CM | POA: Diagnosis not present

## 2019-01-05 DIAGNOSIS — Z1329 Encounter for screening for other suspected endocrine disorder: Secondary | ICD-10-CM | POA: Diagnosis not present

## 2019-01-05 DIAGNOSIS — I5022 Chronic systolic (congestive) heart failure: Secondary | ICD-10-CM | POA: Diagnosis not present

## 2019-01-05 LAB — COMPREHENSIVE METABOLIC PANEL
ALT: 13 U/L (ref 0–35)
AST: 18 U/L (ref 0–37)
Albumin: 4.2 g/dL (ref 3.5–5.2)
Alkaline Phosphatase: 51 U/L (ref 39–117)
BUN: 31 mg/dL — ABNORMAL HIGH (ref 6–23)
CO2: 28 mEq/L (ref 19–32)
Calcium: 9.2 mg/dL (ref 8.4–10.5)
Chloride: 106 mEq/L (ref 96–112)
Creatinine, Ser: 1.29 mg/dL — ABNORMAL HIGH (ref 0.40–1.20)
GFR: 40.04 mL/min — ABNORMAL LOW (ref >60.00)
Glucose, Bld: 109 mg/dL — ABNORMAL HIGH (ref 70–99)
Potassium: 4.4 mEq/L (ref 3.5–5.1)
Sodium: 142 mEq/L (ref 135–145)
Total Bilirubin: 0.5 mg/dL (ref 0.2–1.2)
Total Protein: 6.8 g/dL (ref 6.0–8.3)

## 2019-01-05 LAB — CBC WITH DIFFERENTIAL/PLATELET
Basophils Absolute: 0 10*3/uL (ref 0.0–0.1)
Basophils Relative: 0.6 % (ref 0.0–3.0)
Eosinophils Absolute: 0.1 10*3/uL (ref 0.0–0.7)
Eosinophils Relative: 1.5 % (ref 0.0–5.0)
HCT: 40.7 % (ref 36.0–46.0)
Hemoglobin: 13.2 g/dL (ref 12.0–15.0)
Lymphocytes Relative: 17.4 % (ref 12.0–46.0)
Lymphs Abs: 1.2 10*3/uL (ref 0.7–4.0)
MCHC: 32.4 g/dL (ref 30.0–36.0)
MCV: 96.9 fl (ref 78.0–100.0)
Monocytes Absolute: 0.4 10*3/uL (ref 0.1–1.0)
Monocytes Relative: 6.2 % (ref 3.0–12.0)
Neutro Abs: 5.2 10*3/uL (ref 1.4–7.7)
Neutrophils Relative %: 74.3 % (ref 43.0–77.0)
Platelets: 206 10*3/uL (ref 150.0–400.0)
RBC: 4.2 Mil/uL (ref 3.87–5.11)
RDW: 13.7 % (ref 11.5–15.5)
WBC: 7 10*3/uL (ref 4.0–10.5)

## 2019-01-05 LAB — TSH: TSH: 1.74 u[IU]/mL (ref 0.35–4.50)

## 2019-01-05 LAB — VITAMIN D 25 HYDROXY (VIT D DEFICIENCY, FRACTURES): VITD: 51.44 ng/mL (ref 30.00–100.00)

## 2019-01-05 LAB — LIPID PANEL
Cholesterol: 124 mg/dL (ref 0–200)
HDL: 43.2 mg/dL (ref >39.00)
NonHDL: 80.38
Total CHOL/HDL Ratio: 3
Triglycerides: 201 mg/dL — ABNORMAL HIGH (ref 0.0–149.0)
VLDL: 40.2 mg/dL — ABNORMAL HIGH (ref 0.0–40.0)

## 2019-01-05 LAB — LDL CHOLESTEROL, DIRECT: Direct LDL: 52 mg/dL

## 2019-01-05 LAB — MAGNESIUM: Magnesium: 2.6 mg/dL — ABNORMAL HIGH (ref 1.5–2.5)

## 2019-01-06 LAB — URINALYSIS, ROUTINE W REFLEX MICROSCOPIC
Bacteria, UA: NONE SEEN /HPF
Bilirubin Urine: NEGATIVE
Glucose, UA: NEGATIVE
Hgb urine dipstick: NEGATIVE
Ketones, ur: NEGATIVE
Leukocytes,Ua: NEGATIVE
Nitrite: NEGATIVE
Specific Gravity, Urine: 1.028 (ref 1.001–1.03)
pH: <=5 (ref 5.0–8.0)

## 2019-01-11 ENCOUNTER — Encounter: Payer: Self-pay | Admitting: Internal Medicine

## 2019-02-14 ENCOUNTER — Encounter: Payer: Self-pay | Admitting: Internal Medicine

## 2019-03-20 ENCOUNTER — Other Ambulatory Visit: Payer: Self-pay | Admitting: Cardiovascular Disease

## 2019-03-22 NOTE — Telephone Encounter (Signed)
Please advise when pt should have follow up?  Disposition reads 6 month f/u. Avs instructions read 12 month f/u.

## 2019-03-26 ENCOUNTER — Other Ambulatory Visit: Payer: Self-pay | Admitting: Internal Medicine

## 2019-03-26 DIAGNOSIS — R112 Nausea with vomiting, unspecified: Secondary | ICD-10-CM

## 2019-03-26 MED ORDER — ONDANSETRON 4 MG PO TBDP: ORAL_TABLET | ORAL | 0 refills | Status: DC

## 2019-05-17 ENCOUNTER — Telehealth: Payer: Self-pay | Admitting: Internal Medicine

## 2019-05-17 NOTE — Telephone Encounter (Signed)
Patient ready to schedule Prolia injection on 06/04/19 or after. Tried to reach patient voicemail is full.

## 2019-05-31 NOTE — Telephone Encounter (Signed)
Patient has been scheduled fo r4/6/21

## 2019-06-08 ENCOUNTER — Ambulatory Visit (INDEPENDENT_AMBULATORY_CARE_PROVIDER_SITE_OTHER): Payer: PPO

## 2019-06-08 ENCOUNTER — Other Ambulatory Visit: Payer: Self-pay

## 2019-06-08 DIAGNOSIS — M810 Age-related osteoporosis without current pathological fracture: Secondary | ICD-10-CM

## 2019-06-08 MED ORDER — DENOSUMAB 60 MG/ML ~~LOC~~ SOSY
60.0000 mg | PREFILLED_SYRINGE | Freq: Once | SUBCUTANEOUS | Status: AC
  Administered 2019-06-08: 60 mg via SUBCUTANEOUS

## 2019-06-08 NOTE — Progress Notes (Signed)
Patient presented for 6-month Prolia injection SQ to right arm. Patient tolerated well. 

## 2019-07-06 ENCOUNTER — Other Ambulatory Visit: Payer: Self-pay

## 2019-07-08 ENCOUNTER — Encounter: Payer: Self-pay | Admitting: Internal Medicine

## 2019-07-08 ENCOUNTER — Ambulatory Visit (INDEPENDENT_AMBULATORY_CARE_PROVIDER_SITE_OTHER): Payer: PPO | Admitting: Internal Medicine

## 2019-07-08 ENCOUNTER — Other Ambulatory Visit: Payer: Self-pay

## 2019-07-08 VITALS — BP 128/66 | HR 84 | Temp 97.0°F | Ht 61.0 in | Wt 137.6 lb

## 2019-07-08 DIAGNOSIS — E782 Mixed hyperlipidemia: Secondary | ICD-10-CM

## 2019-07-08 DIAGNOSIS — R739 Hyperglycemia, unspecified: Secondary | ICD-10-CM | POA: Diagnosis not present

## 2019-07-08 DIAGNOSIS — I5022 Chronic systolic (congestive) heart failure: Secondary | ICD-10-CM

## 2019-07-08 DIAGNOSIS — N1832 Chronic kidney disease, stage 3b: Secondary | ICD-10-CM

## 2019-07-08 DIAGNOSIS — I10 Essential (primary) hypertension: Secondary | ICD-10-CM | POA: Diagnosis not present

## 2019-07-08 DIAGNOSIS — I25118 Atherosclerotic heart disease of native coronary artery with other forms of angina pectoris: Secondary | ICD-10-CM

## 2019-07-08 DIAGNOSIS — R7303 Prediabetes: Secondary | ICD-10-CM | POA: Insufficient documentation

## 2019-07-08 LAB — CBC WITH DIFFERENTIAL/PLATELET
Basophils Absolute: 0.1 10*3/uL (ref 0.0–0.1)
Basophils Relative: 0.6 % (ref 0.0–3.0)
Eosinophils Absolute: 0.1 10*3/uL (ref 0.0–0.7)
Eosinophils Relative: 1.6 % (ref 0.0–5.0)
HCT: 39.6 % (ref 36.0–46.0)
Hemoglobin: 13.3 g/dL (ref 12.0–15.0)
Lymphocytes Relative: 19.7 % (ref 12.0–46.0)
Lymphs Abs: 1.7 10*3/uL (ref 0.7–4.0)
MCHC: 33.6 g/dL (ref 30.0–36.0)
MCV: 95.8 fl (ref 78.0–100.0)
Monocytes Absolute: 0.5 10*3/uL (ref 0.1–1.0)
Monocytes Relative: 6.3 % (ref 3.0–12.0)
Neutro Abs: 6 10*3/uL (ref 1.4–7.7)
Neutrophils Relative %: 71.8 % (ref 43.0–77.0)
Platelets: 210 10*3/uL (ref 150.0–400.0)
RBC: 4.13 Mil/uL (ref 3.87–5.11)
RDW: 13.8 % (ref 11.5–15.5)
WBC: 8.4 10*3/uL (ref 4.0–10.5)

## 2019-07-08 LAB — COMPREHENSIVE METABOLIC PANEL
ALT: 15 U/L (ref 0–35)
AST: 19 U/L (ref 0–37)
Albumin: 4.4 g/dL (ref 3.5–5.2)
Alkaline Phosphatase: 53 U/L (ref 39–117)
BUN: 44 mg/dL — ABNORMAL HIGH (ref 6–23)
CO2: 26 mEq/L (ref 19–32)
Calcium: 10.3 mg/dL (ref 8.4–10.5)
Chloride: 104 mEq/L (ref 96–112)
Creatinine, Ser: 1.29 mg/dL — ABNORMAL HIGH (ref 0.40–1.20)
GFR: 39.99 mL/min — ABNORMAL LOW (ref >60.00)
Glucose, Bld: 119 mg/dL — ABNORMAL HIGH (ref 70–99)
Potassium: 4.7 mEq/L (ref 3.5–5.1)
Sodium: 138 mEq/L (ref 135–145)
Total Bilirubin: 0.4 mg/dL (ref 0.2–1.2)
Total Protein: 7.3 g/dL (ref 6.0–8.3)

## 2019-07-08 LAB — MAGNESIUM: Magnesium: 2.5 mg/dL (ref 1.5–2.5)

## 2019-07-08 LAB — HEMOGLOBIN A1C: Hgb A1c MFr Bld: 5.8 % (ref 4.6–6.5)

## 2019-07-08 NOTE — Patient Instructions (Signed)
Take care and let me know if you need anything Mrs. Altamese Frannie

## 2019-07-08 NOTE — Progress Notes (Signed)
Chief Complaint  Patient presents with  . Follow-up   F/u with husband who had stroke 03/2019  1.chronic systolic chf stable and HTN controlled on toprol xl 25 mg qd torsemide 20 mg qd  2. HLD on crestor 40 mg qhs  3. Anxiety xanax 0.25 not taking    Review of Systems  Constitutional: Negative for weight loss.  HENT: Negative for hearing loss.   Eyes: Negative for blurred vision.  Respiratory: Negative for shortness of breath.   Cardiovascular: Negative for chest pain and leg swelling.  Gastrointestinal: Negative for abdominal pain.  Musculoskeletal: Negative for falls.  Skin: Negative for rash.  Neurological: Negative for headaches.  Psychiatric/Behavioral: Negative for memory loss.   Past Medical History:  Diagnosis Date  . Anxiety   . Arthritis   . CAD (coronary artery disease)    a. cardiac cath 10/2014: ostial LAD 30%, prox LAD 90% s/p PCI/DES, ostial RCA 30%, mid RCA 90% s/p PCI/DES, ostial Ramus 50%  . Chronic back pain   . Chronic fatigue   . Chronic systolic CHF (congestive heart failure) ([place])    a. echo 10/2014: EF 20%, AK inf, inf-sep, distal ant, & apical walls, HK elsewhere. Cavity size mildly dilated. Wall thickness nl. mild AI. Mild to mod MR. LA mildly dilated. RV sys fxn mod to severely reduced. small pericardial effusion was ID'd.   . Hepatitis C   . Ischemic cardiomyopathy   . Osteoporosis   . Systolic CHF, chronic ([place])   . UTI (lower urinary tract infection)    Past Surgical History:  Procedure Laterality Date  . BACK SURGERY  07/14   T10, L1  . CARDIAC CATHETERIZATION Bilateral 10/27/2014   Procedure: Left Heart Cath and Coronary Angiography;  Surgeon: Minna Merritts, MD;  Location: [facility];  Service: Cardiovascular;  Laterality: Bilateral;  . CARDIAC CATHETERIZATION N/A 10/27/2014   Procedure: Coronary Stent Intervention;  Surgeon: Wellington Hampshire, MD;  Location: [facility];  Service: Cardiovascular;  Laterality: N/A;    Family History  Problem Relation Age of Onset  . Arthritis Mother   . Heart disease Father        passed at 51  . Diabetes Father    Social History   Socioeconomic History  . Marital status: Married    Spouse name: Jaiah Weigel  . Number of children: 0  . Years of education: 18  . Highest education level: Not on file  Occupational History  . Occupation: Receptionist    Comment: Retired in [place] [employer]  . Smoking status: Former Smoker    Packs/day: 1.00    Years: 10.00    Pack years: 10.00  . Smokeless tobacco: Never Used  Substance and Sexual Activity  . Alcohol use: No    Alcohol/week: 0.0 standard drinks  . Drug use: No  . Sexual activity: Yes    Birth control/protection: Post-menopausal  Other Topics Concern  . Not on file  Social History Narrative   Pt is 79 yo female, married to husband of 21 years.  Pt has no children.  Pt lives with husband and teacup poodle, [name]. 38 y.o as of 01/01/19.  Pt states she hurt her back last year and has not been able to participate in much physical activity since. Pt enjoys traveling with her husband in motor home, doing crossword puzzles with husband, and playing cards with friends.  Pt was raised in [facility] and has lived in [place] entire adult life.  Pt worked in [employer] in [place] until [place] when they closed.     Social Determinants of Health   Financial Resource Strain: Low Risk   . Difficulty of Paying Living Expenses: Not hard at all  Food Insecurity: No Food Insecurity  . Worried About Charity fundraiser in the Last Year: Never true  . Ran Out of Food in the Last Year: Never true  Transportation Needs: No Transportation Needs  . Lack of Transportation (Medical): No  . Lack of Transportation (Non-Medical): No  Physical Activity: Insufficiently Active  . Days of Exercise per Week: 1 day  . Minutes of Exercise per Session: 20 min  Stress: No Stress Concern Present  . Feeling of Stress : Not at all   Social Connections:   . Frequency of Communication with Friends and Family:   . Frequency of Social Gatherings with Friends and Family:   . Attends Religious Services:   . Active Member of Clubs or Organizations:   . Attends Archivist Meetings:   Marland Kitchen Marital Status:   Intimate Partner Violence:   . Fear of Current or Ex-Partner:   . Emotionally Abused:   Marland Kitchen Physically Abused:   . Sexually Abused:    Current Meds  Medication Sig  . Calcium-Vitamin A-Vitamin D (LIQUID CALCIUM PO) Take by mouth. 1 tablespoon daily  . cholecalciferol (VITAMIN D) 1000 UNITS tablet Take 1,000 Units by mouth daily.  . citalopram (CELEXA) 10 MG tablet Take 1 tablet (10 mg total) by mouth daily.  . clopidogrel (PLAVIX) 75 MG tablet Take 1 tablet (75 mg total) by mouth daily.  Marland Kitchen denosumab (PROLIA) 60 MG/ML SOSY injection Inject 60 mg into the skin every 6 (six) months.  Marland Kitchen ipratropium (ATROVENT) 0.06 % nasal spray Place 2 sprays into both nostrils 3 (three) times daily.  . metoprolol succinate (TOPROL-XL) 25 MG 24 hr tablet TAKE 1 TABLET BY MOUTH EVERYDAY AT BEDTIME  . Multiple Vitamins-Minerals (HAIR/SKIN/NAILS/BIOTIN PO) Take by mouth.  . ondansetron (ZOFRAN-ODT) 4 MG disintegrating tablet TAKE 1 TABLET BY MOUTH TWICE A DAY AS NEEDED FOR NAUSEA AND VOMITING appt further refills no exceptions  . potassium chloride (MICRO-K) 10 MEQ CR capsule Take 1 capsule (10 mEq total) by mouth daily. Except for Sunday  . rosuvastatin (CRESTOR) 40 MG tablet Take 1 tablet (40 mg total) by mouth daily at 6 PM.  . sacubitril-valsartan (ENTRESTO) 24-26 MG Take 1 tablet by mouth 2 (two) times daily.  Marland Kitchen torsemide (DEMADEX) 20 MG tablet TAKE 1 TABLET (20MG ) BY MOUTH DAILY EXCEPT FOR SUNDAYS   Allergies  Allergen Reactions  . Sugar-Protein-Starch Nausea And Vomiting    Only to sugar   No results found for this or any previous visit (from the past 2160 hour(s)). Objective  Body mass index is 26 kg/m. Wt Readings from  Last 3 Encounters:  07/08/19 137 lb 9.6 oz (62.4 kg)  01/01/19 125 lb (56.7 kg)  12/31/17 115 lb (52.2 kg)   Temp Readings from Last 3 Encounters:  07/08/19 (!) 97 F (36.1 C) (Temporal)  05/14/17 97.6 F (36.4 C) (Oral)  03/24/17 (!) 97.5 F (36.4 C) (Oral)   BP Readings from Last 3 Encounters:  07/08/19 128/66  01/01/19 101/65  12/31/17 140/60   Pulse Readings from Last 3 Encounters:  07/08/19 84  01/01/19 89  12/31/17 76    Physical Exam Vitals and nursing note reviewed.  Constitutional:      Appearance: Normal appearance. She is well-developed and well-groomed.  HENT:     Head: Normocephalic and atraumatic.  Eyes:     Conjunctiva/sclera: Conjunctivae normal.     Pupils: Pupils are equal, round, and reactive to light.  Cardiovascular:     Rate and Rhythm: Normal rate and regular rhythm.     Heart sounds: Normal heart sounds. No murmur.  Pulmonary:     Effort: Pulmonary effort is normal.     Breath sounds: Normal breath sounds.  Musculoskeletal:     Right lower leg: 1+ Pitting Edema present.     Left lower leg: 1+ Pitting Edema present.  Skin:    General: Skin is warm and dry.  Neurological:     General: No focal deficit present.     Mental Status: She is alert and oriented to person, place, and time. Mental status is at baseline.     Gait: Gait normal.     Comments: BL walks with cane   Psychiatric:        Attention and Perception: Attention and perception normal.        Mood and Affect: Mood and affect normal.        Speech: Speech normal.        Behavior: Behavior normal. Behavior is cooperative.        Thought Content: Thought content normal.        Cognition and Memory: Cognition and memory normal.        Judgment: Judgment normal.     Assessment  Plan  Essential hypertension/CAD/chronic systolic CHF - Plan: Comprehensive metabolic panel, CBC with Differential/Platelet Cont meds   Mixed hyperlipidemia Cont crestor 40 mg qhs  Hyperglycemia -  Plan: Hemoglobin A1c  Hypermagnesemia - Plan: Magnesium  Stage 3b chronic kidney disease Monitor  Hydration with water <2L daily    HM Had flu shot utd 2020  pna 23 updated  Had prevnar Declines Tdap  Consider shingrix in futuredeclines covid 2/2   Consider fobt vs cologuard declines  colonoscopy 2003 declines further   mammo declines last had years ago not in the area and declines  Pap out of age window  DEXA 01/31/16 osteoporosis + consider check vit D in futuretry to add on today. On Prolia. Will needto disccalcium and vit D supplementation in futuremake sure dose is adequate hep c neg 10/29/16   Provider: Dr. Olivia Mackie McLean-Scocuz[facility]-Internal Medicine

## 2019-08-18 ENCOUNTER — Ambulatory Visit: Payer: PPO | Admitting: Cardiovascular Disease

## 2019-08-30 ENCOUNTER — Other Ambulatory Visit: Payer: Self-pay | Admitting: Cardiovascular Disease

## 2019-08-30 ENCOUNTER — Ambulatory Visit: Payer: PPO | Admitting: Cardiovascular Disease

## 2019-09-01 ENCOUNTER — Other Ambulatory Visit: Payer: Self-pay | Admitting: Internal Medicine

## 2019-09-01 DIAGNOSIS — J310 Chronic rhinitis: Secondary | ICD-10-CM

## 2019-09-01 MED ORDER — IPRATROPIUM BROMIDE 0.06 % NA SOLN: 2.0000 | Freq: Three times a day (TID) | NASAL | 12 refills | Status: DC

## 2019-09-08 ENCOUNTER — Other Ambulatory Visit: Payer: Self-pay | Admitting: Cardiovascular Disease

## 2019-09-19 ENCOUNTER — Other Ambulatory Visit: Payer: Self-pay | Admitting: Cardiovascular Disease

## 2019-09-24 ENCOUNTER — Other Ambulatory Visit: Payer: Self-pay | Admitting: Cardiovascular Disease

## 2019-09-24 DIAGNOSIS — I5022 Chronic systolic (congestive) heart failure: Secondary | ICD-10-CM

## 2019-10-15 ENCOUNTER — Other Ambulatory Visit: Payer: Self-pay | Admitting: Cardiovascular Disease

## 2019-10-27 ENCOUNTER — Telehealth: Payer: Self-pay | Admitting: Cardiovascular Disease

## 2019-10-27 NOTE — Telephone Encounter (Signed)
  Patient Consent for Virtual Visit         [name] has provided verbal consent on 10/27/2019 for a virtual visit (video or telephone).   CONSENT FOR VIRTUAL VISIT FOR:  [name]  By participating in this virtual visit I agree to the following:  I hereby voluntarily request, consent and authorize [facility] and its employed or contracted physicians, physician assistants, nurse practitioners or other licensed health care professionals (the Practitioner), to provide me with telemedicine health care services (the "Services") as deemed necessary by the treating Practitioner. I acknowledge and consent to receive the Services by the Practitioner via telemedicine. I understand that the telemedicine visit will involve communicating with the Practitioner through live audiovisual communication technology and the disclosure of certain medical information by electronic transmission. I acknowledge that I have been given the opportunity to request an in-person assessment or other available alternative prior to the telemedicine visit and am voluntarily participating in the telemedicine visit.  I understand that I have the right to withhold or withdraw my consent to the use of telemedicine in the course of my care at any time, without affecting my right to future care or treatment, and that the Practitioner or I may terminate the telemedicine visit at any time. I understand that I have the right to inspect all information obtained and/or recorded in the course of the telemedicine visit and may receive copies of available information for a reasonable fee.  I understand that some of the potential risks of receiving the Services via telemedicine include:  Marland Kitchen Delay or interruption in medical evaluation due to technological equipment failure or disruption; . Information transmitted may not be sufficient (e.g. poor resolution of images) to allow for appropriate medical decision making by the Practitioner;  and/or  . In rare instances, security protocols could fail, causing a breach of personal health information.  Furthermore, I acknowledge that it is my responsibility to provide information about my medical history, conditions and care that is complete and accurate to the best of my ability. I acknowledge that Practitioner's advice, recommendations, and/or decision may be based on factors not within their control, such as incomplete or inaccurate data provided by me or distortions of diagnostic images or specimens that may result from electronic transmissions. I understand that the practice of medicine is not an exact science and that Practitioner makes no warranties or guarantees regarding treatment outcomes. I acknowledge that a copy of this consent can be made available to me via my patient portal ([facility]), or I can request a printed copy by calling the office of [facility].    I understand that my insurance will be billed for this visit.   I have read or had this consent read to me. . I understand the contents of this consent, which adequately explains the benefits and risks of the Services being provided via telemedicine.  . I have been provided ample opportunity to ask questions regarding this consent and the Services and have had my questions answered to my satisfaction. . I give my informed consent for the services to be provided through the use of telemedicine in my medical care

## 2019-11-01 ENCOUNTER — Ambulatory Visit: Payer: PPO | Admitting: Cardiovascular Disease

## 2019-11-10 ENCOUNTER — Encounter: Payer: Self-pay | Admitting: Cardiovascular Disease

## 2019-11-10 ENCOUNTER — Other Ambulatory Visit: Payer: Self-pay

## 2019-11-10 ENCOUNTER — Telehealth (INDEPENDENT_AMBULATORY_CARE_PROVIDER_SITE_OTHER): Payer: PPO | Admitting: Cardiovascular Disease

## 2019-11-10 VITALS — BP 114/65 | HR 85 | Temp 97.3°F | Ht 60.0 in | Wt 139.0 lb

## 2019-11-10 DIAGNOSIS — I25118 Atherosclerotic heart disease of native coronary artery with other forms of angina pectoris: Secondary | ICD-10-CM

## 2019-11-10 DIAGNOSIS — J449 Chronic obstructive pulmonary disease, unspecified: Secondary | ICD-10-CM | POA: Diagnosis not present

## 2019-11-10 DIAGNOSIS — E782 Mixed hyperlipidemia: Secondary | ICD-10-CM

## 2019-11-10 DIAGNOSIS — I10 Essential (primary) hypertension: Secondary | ICD-10-CM

## 2019-11-10 DIAGNOSIS — I5022 Chronic systolic (congestive) heart failure: Secondary | ICD-10-CM | POA: Diagnosis not present

## 2019-11-10 MED ORDER — METOPROLOL SUCCINATE ER 25 MG PO TB24: 25.0000 mg | ORAL_TABLET | Freq: Every day | ORAL | 2 refills | Status: DC

## 2019-11-10 MED ORDER — POTASSIUM CHLORIDE CRYS ER 10 MEQ PO TBCR: EXTENDED_RELEASE_TABLET | ORAL | 2 refills | Status: DC

## 2019-11-10 MED ORDER — ENTRESTO 24-26 MG PO TABS: 1.0000 | ORAL_TABLET | Freq: Two times a day (BID) | ORAL | 2 refills | Status: DC

## 2019-11-10 MED ORDER — ROSUVASTATIN CALCIUM 40 MG PO TABS: 40.0000 mg | ORAL_TABLET | Freq: Every day | ORAL | 2 refills | Status: DC

## 2019-11-10 MED ORDER — TORSEMIDE 20 MG PO TABS: ORAL_TABLET | ORAL | 2 refills | Status: DC

## 2019-11-10 MED ORDER — CLOPIDOGREL BISULFATE 75 MG PO TABS: 75.0000 mg | ORAL_TABLET | Freq: Every day | ORAL | 2 refills | Status: DC

## 2019-11-10 NOTE — Patient Instructions (Signed)
Refills needed   Medication Instructions:  No changes  If you need a refill on your cardiac medications before your next appointment, please call your pharmacy.    Lab work: No new labs needed   If you have labs (blood work) drawn today and your tests are completely normal, you will receive your results only by: Marland Kitchen MyChart Message (if you have MyChart) OR . A paper copy in the mail If you have any lab test that is abnormal or we need to change your treatment, we will call you to review the results.   Testing/Procedures: No new testing needed   Follow-Up: At [facility], you and your health needs are our priority.  As part of our continuing mission to provide you with exceptional heart care, we have created designated Provider Care Teams.  These Care Teams include your primary Cardiologist (physician) and Advanced Practice Providers (APPs -  Physician Assistants and Nurse Practitioners) who all work together to provide you with the care you need, when you need it.  . You will need a follow up appointment in 6 months  . Providers on your designated Care Team:   . Murray Hodgkins, NP . Christell Faith, PA-C . Marrianne Mood, PA-C  Any Other Special Instructions Will Be Listed Below (If Applicable).  COVID-19 Vaccine Information can be found at: [email or web address] For questions related to vaccine distribution or appointments, please email [email or web address] or call [phone].

## 2019-11-10 NOTE — Progress Notes (Signed)
Virtual Visit via Telephone Note   This visit type was conducted due to national recommendations for restrictions regarding the COVID-19 Pandemic (e.g. social distancing) in an effort to limit this patient's exposure and mitigate transmission in our community.  Due to her co-morbid illnesses, this patient is at least at moderate risk for complications without adequate follow up.  This format is felt to be most appropriate for this patient at this time.  The patient did not have access to video technology/had technical difficulties with video requiring transitioning to audio format only (telephone).  All issues noted in this document were discussed and addressed.  No physical exam could be performed with this format.  Please refer to the patient's chart for her  consent to telehealth for [facility].   I connected with  [name] on 11/10/19 by a video enabled telemedicine application and verified that I am speaking with the correct person using two identifiers. I discussed the limitations of evaluation and management by telemedicine. The patient expressed understanding and agreed to proceed.   Evaluation Performed:  Follow-up visit  Date:  11/10/2019   ID:  [name], DOB 1940-05-12, MRN [id number]  Patient Location:  [place] [address]   Provider location:   [facility], [place] office  PCP:  McLean-Scocuzza, Nino Glow, MD  Cardiologist:  Patsy Baltimore  Chief Complaint  Patient presents with  . other    12 month f/u no complaints today. Meds reviewed verbally with pt.    History of Present Illness:    [name] is a 79 y.o. female who presents via audio/video conferencing for a telehealth visit today.   The patient does not symptoms concerning for COVID-19 infection (fever, chills, cough, or new SHORTNESS OF BREATH).   Patient has a past medical history of hepatitis C,  osteoporosis,  arthritis,  chronic back pain s/p multiple  fusions,  chronic UTI's,  ischemic cardiomyopathy with ejection fraction 25% on echocardiogram Mitral valve: There was moderate to severe regurgitation  November 2016, coronary artery disease with PCI to the mid RCA,  proximal LAD August 2016,  Significant medication intolerances Previously not interested in defibrillator or repeat echocardiogram EF in 06/2017, 20 to 25% who presents for routine follow-up of her cardiomyopathy, CAD  She denies any recent CHF exacerbation Tolerating her medications Some fluid retention, leg swelling From watermelon with salt She cut back on the watermelon and salt and leg swelling has resolved, did not have to take extra torsemide  At home BP 114/65, pulse:85 Denies any orthostasis symptoms Getting along okay at home, husband helping, though he had a previous stroke last year  Labs reviewed CR 1.29, BUN 44  Weight stable:  Weight has been trending upwards, eating more  Chronic Back pain limiting her ability to exert herself Walks with a cane, limited by kyphosis  Reports anxiety better controlled Chronic runny nose  No past medical history reviewed  echocardiogram April 2019 showed severely depressed LV function, no improvement from 2018  She previously felt Plavix was causing her nose to run.  Also Previously she felt Lasix was causing her runny nose.now taking Lasix, not taking torsemide  She was unable to tolerate brilinta She washaving side effects from the entresto, only able to tolerate 1/2 dose of the 24/26 mg pill once a day in the evening. Able to tolerate metoprolol, Lipitor, potassium  She is not interested in defibrillator. We have had previous discussions  She did consider applying external defibrillator to put in their house  Other past medical history reviewed [facility] 10/25/14 to 8/26 for new onset acute systolic CHF, ischemic cardiomyopathy, acute respiratory distress with hypoxia, and newly diagnosed CAD s/p PCI/DES to  mid RCA and prox LAD.    [facility] with acute respiraotry destress with hypoxia   Echo August 2016 showed EF of 20%, akinesis of the inferior, inferoseptal, distal anterior, and apical walls. There was hypokinesis elsewhere. Cavity size was mildly dilated. Wall thickness was normal. Mild aortic regurgitation. Mild to moderate mitral regurgitation. Left atrium was mildly dilated. Right ventricle systolic was was moderate to severely reduced. A small pericardial effusion was seen.   cardiac cath on 8/25 that showed ostial LAD 30%, prox LAD 90%, ostial RCA 30%, mid RCA 90%, ostial Ramus 50%.  She underwent successful PCI/DES to the proximal LAD and mid RCA with Xience DES. She was started on aspirin and Brilinta.   she could not tolerated Brilinta 2/2 SOB. She was changed to Plavix.    Prior CV studies:   The following studies were reviewed today:    Past Medical History:  Diagnosis Date  . Anxiety   . Arthritis   . CAD (coronary artery disease)    a. cardiac cath 10/2014: ostial LAD 30%, prox LAD 90% s/p PCI/DES, ostial RCA 30%, mid RCA 90% s/p PCI/DES, ostial Ramus 50%  . Chronic back pain   . Chronic fatigue   . Chronic systolic CHF (congestive heart failure) ([place])    a. echo 10/2014: EF 20%, AK inf, inf-sep, distal ant, & apical walls, HK elsewhere. Cavity size mildly dilated. Wall thickness nl. mild AI. Mild to mod MR. LA mildly dilated. RV sys fxn mod to severely reduced. small pericardial effusion was ID'd.   . Hepatitis C   . Ischemic cardiomyopathy   . Osteoporosis   . Systolic CHF, chronic ([place])   . UTI (lower urinary tract infection)    Past Surgical History:  Procedure Laterality Date  . BACK SURGERY  07/14   T10, L1  . CARDIAC CATHETERIZATION Bilateral 10/27/2014   Procedure: Left Heart Cath and Coronary Angiography;  Surgeon: Minna Merritts, MD;  Location: [place] [facility];  Service: Cardiovascular;  Laterality: Bilateral;  . CARDIAC  CATHETERIZATION N/A 10/27/2014   Procedure: Coronary Stent Intervention;  Surgeon: Wellington Hampshire, MD;  Location: [place] [facility];  Service: Cardiovascular;  Laterality: N/A;     Current Meds  Medication Sig  . albuterol (PROVENTIL HFA;VENTOLIN HFA) 108 (90 Base) MCG/ACT inhaler Inhale 2 puffs into the lungs every 6 (six) hours as needed for wheezing or shortness of breath.  . ALPRAZolam (XANAX) 0.25 MG tablet Qd to bid prn  . Calcium-Vitamin A-Vitamin D (LIQUID CALCIUM PO) Take by mouth. 1 tablespoon daily  . cholecalciferol (VITAMIN D) 1000 UNITS tablet Take 1,000 Units by mouth daily.  . clopidogrel (PLAVIX) 75 MG tablet TAKE 1 TABLET BY MOUTH EVERY DAY  . denosumab (PROLIA) 60 MG/ML SOSY injection Inject 60 mg into the skin every 6 (six) months.  . ENTRESTO 24-26 MG TAKE 1 TABLET BY MOUTH TWICE A DAY  . ipratropium (ATROVENT) 0.06 % nasal spray Place 2 sprays into both nostrils 3 (three) times daily. (Patient taking differently: Place 2 sprays into both nostrils daily. )  . KLOR-CON M10 10 MEQ tablet TAKE 1 TABLET (10 MEQ TOTAL) BY MOUTH DAILY. EXCEPT FOR SUNDAY  . metoprolol succinate (TOPROL-XL) 25 MG 24  hr tablet TAKE 1 TABLET BY MOUTH EVERYDAY AT BEDTIME  . ondansetron (ZOFRAN-ODT) 4 MG disintegrating tablet TAKE 1 TABLET BY MOUTH TWICE A DAY AS NEEDED FOR NAUSEA AND VOMITING appt further refills no exceptions  . rosuvastatin (CRESTOR) 40 MG tablet TAKE 1 TABLET (40 MG TOTAL) BY MOUTH DAILY AT 6 PM.  . torsemide (DEMADEX) 20 MG tablet TAKE 1 TABLET (20 MG TOTAL) BY MOUTH DAILY. EXCEPT ON SUNDAYS.     Allergies:   Sugar-protein-starch   Social History   Tobacco Use  . Smoking status: Former Smoker    Packs/day: 1.00    Years: 10.00    Pack years: 10.00  . Smokeless tobacco: Never Used  Substance Use Topics  . Alcohol use: No    Alcohol/week: 0.0 standard drinks  . Drug use: No      Family Hx: The patient's family history includes Arthritis in her mother; Diabetes  in her father; Heart disease in her father.  ROS:   Please see the history of present illness.    Review of Systems  Constitutional: Negative.   HENT: Negative.   Respiratory: Negative.   Cardiovascular: Negative.   Gastrointestinal: Negative.   Musculoskeletal: Negative.   Neurological: Negative.   Psychiatric/Behavioral: Negative.   All other systems reviewed and are negative.    Labs/Other Tests and Data Reviewed:    Recent Labs: 01/05/2019: TSH 1.74 07/08/2019: ALT 15; BUN 44; Creatinine, Ser 1.29; Hemoglobin 13.3; Magnesium 2.5; Platelets 210.0; Potassium 4.7; Sodium 138   Recent Lipid Panel Lab Results  Component Value Date/Time   CHOL 124 01/05/2019 10:52 AM   TRIG 201.0 (H) 01/05/2019 10:52 AM   HDL 43.20 01/05/2019 10:52 AM   CHOLHDL 3 01/05/2019 10:52 AM   LDLCALC 49 10/29/2016 09:41 AM   LDLDIRECT 52.0 01/05/2019 10:52 AM    Wt Readings from Last 3 Encounters:  11/10/19 139 lb (63 kg)  07/08/19 137 lb 9.6 oz (62.4 kg)  01/01/19 125 lb (56.7 kg)     Exam:    Vital Signs: Vital signs may also be detailed in the HPI BP 114/65 (BP Location: Left Arm, Patient Position: Sitting, Cuff Size: Normal)   Pulse 85   Temp (!) 97.3 F (36.3 C)   Ht 5' (1.524 m)   Wt 139 lb (63 kg)   SpO2 96%   BMI 27.15 kg/m   Wt Readings from Last 3 Encounters:  11/10/19 139 lb (63 kg)  07/08/19 137 lb 9.6 oz (62.4 kg)  01/01/19 125 lb (56.7 kg)   Temp Readings from Last 3 Encounters:  11/10/19 (!) 97.3 F (36.3 C)  07/08/19 (!) 97 F (36.1 C) (Temporal)  05/14/17 97.6 F (36.4 C) (Oral)   BP Readings from Last 3 Encounters:  11/10/19 114/65  07/08/19 128/66  01/01/19 101/65   Pulse Readings from Last 3 Encounters:  11/10/19 85  07/08/19 84  01/01/19 89    Constitutional:  oriented to person, place, and time. No distress.  HENT:  Pulmonary/Chest: No significant shortness of breath over the phone Psychiatric:  normal mood and affect. behavior is normal. Thought  content normal.    ASSESSMENT & PLAN:    Coronary artery disease of native artery of native heart with stable angina pectoris (HCC) -  Currently with no symptoms of angina. No further workup at this time. Continue current medication regimen. Stable  Ischemic cardiomyopathy - EF 20 to 25% She does want ICD.  We have had previous discussions She is also previously declined  repeat echocardiogram, will discuss with her in follow-up She was previously declined any escalation to her medications as she felt well Given low blood pressure, no titration to her medications at this time indicated  Chronic systolic CHF (congestive heart failure) ([place]) -  Per her report she is euvolemic though did have fluid retention after eating lots of watermelon with salt She has stopped her salt diet, recommended if she does get leg swelling that does not improve that she take extra torsemide in the afternoon as needed Otherwise she will stay on torsemide 1 daily  Mixed hyperlipidemia -  Cholesterol is at goal on the current lipid regimen. No changes to the medications were made.  Essential hypertension -  No medication changes made, no orthostasis  Anxiety -  Reports that she is doing well, very content    COVID-19 Education: The signs and symptoms of COVID-19 were discussed with the patient and how to seek care for testing (follow up with PCP or arrange E-visit).  The importance of social distancing was discussed today.  Patient Risk:   After full review of this patients clinical status, I feel that they are at least moderate risk at this time.  Time:   Today, I have spent 25 minutes with the patient with telehealth technology discussing the cardiac and medical problems/diagnoses detailed above   10 min spent reviewing the chart prior to patient visit today   Medication Adjustments/Labs and Tests Ordered: Current medicines are reviewed at length with the patient today.  Concerns regarding  medicines are outlined above.   Tests Ordered: No tests ordered   Medication Changes: No changes made    Signed, Ida Rogue, MD  11/10/2019 9:37 AM    [place] Office [address] [place] [address], [place], [place] [address]

## 2019-12-07 ENCOUNTER — Telehealth: Payer: Self-pay | Admitting: Internal Medicine

## 2019-12-07 NOTE — Telephone Encounter (Signed)
Pt wanted to know if there was a prolia injection available for her to come in  She is due tomorrow

## 2019-12-08 NOTE — Telephone Encounter (Signed)
Patient scheduled for 12/09/19

## 2019-12-09 ENCOUNTER — Ambulatory Visit: Payer: PPO

## 2019-12-16 ENCOUNTER — Ambulatory Visit (INDEPENDENT_AMBULATORY_CARE_PROVIDER_SITE_OTHER): Payer: PPO

## 2019-12-16 ENCOUNTER — Other Ambulatory Visit: Payer: Self-pay

## 2019-12-16 DIAGNOSIS — M810 Age-related osteoporosis without current pathological fracture: Secondary | ICD-10-CM

## 2019-12-16 MED ORDER — DENOSUMAB 60 MG/ML ~~LOC~~ SOSY
60.0000 mg | PREFILLED_SYRINGE | Freq: Once | SUBCUTANEOUS | Status: AC
  Administered 2019-12-16: 60 mg via SUBCUTANEOUS

## 2019-12-16 NOTE — Progress Notes (Signed)
Patient presented for 6-month Prolia injection SQ to right arm. Patient tolerated well. 

## 2019-12-29 ENCOUNTER — Ambulatory Visit: Payer: PPO

## 2020-01-06 ENCOUNTER — Telehealth: Payer: Self-pay

## 2020-01-06 NOTE — Telephone Encounter (Signed)
Called and spoke to [place]. Zoeann declined scheduled for a physical with me and states that she would call us back to schedule.

## 2020-02-29 ENCOUNTER — Ambulatory Visit (INDEPENDENT_AMBULATORY_CARE_PROVIDER_SITE_OTHER): Payer: PPO

## 2020-02-29 VITALS — BP 94/51 | HR 76 | Temp 97.7°F | Ht 60.0 in | Wt 139.0 lb

## 2020-02-29 DIAGNOSIS — Z0000 Encounter for general adult medical examination without abnormal findings: Secondary | ICD-10-CM

## 2020-02-29 NOTE — Patient Instructions (Addendum)
[name] , Thank you for taking time to come for your Medicare Wellness Visit. I appreciate your ongoing commitment to your health goals. Please review the following plan we discussed and let me know if I can assist you in the future.   These are the goals we discussed: Goals    . Increase physical activity       This is a list of the screening recommended for you and due dates:  Health Maintenance  Topic Date Due  . Tetanus Vaccine  02/28/2021*  . Flu Shot  Completed  . DEXA scan (bone density measurement)  Completed  . COVID-19 Vaccine  Completed  .  Hepatitis C: One time screening is recommended by Center for Disease Control  (CDC) for  adults born from [birth year] through 1964.   Completed  . Pneumonia vaccines  Completed  . Urine Protein Check  Discontinued  *Topic was postponed. The date shown is not the original due date.    Immunizations Immunization History  Administered Date(s) Administered  . Influenza Split 11/05/2012, 11/16/2013  . Influenza, High Dose Seasonal PF 11/20/2016, 11/12/2017, 11/19/2018  . Influenza-Unspecified 11/23/2014, 11/26/2019  . PFIZER SARS-COV-2 Vaccination 03/30/2019, 04/20/2019, 12/10/2019  . Pneumococcal Conjugate-13 02/06/2015  . Pneumococcal Polysaccharide-23 03/04/2010, 05/14/2017   Advanced directives: End of life planning; Advance aging; Advanced directives discussed.  Copy of current HCPOA/Living Will requested.    Conditions/risks identified: none new  Follow up in one year for your annual wellness visit    Preventive Care 65 Years and Older, Female Preventive care refers to lifestyle choices and visits with your health care provider that can promote health and wellness. What does preventive care include?  A yearly physical exam. This is also called an annual well check.  Dental exams once or twice a year.  Routine eye exams. Ask your health care provider how often you should have your eyes checked.  Personal lifestyle choices,  including:  Daily care of your teeth and gums.  Regular physical activity.  Eating a healthy diet.  Avoiding tobacco and drug use.  Limiting alcohol use.  Practicing safe sex.  Taking low-dose aspirin every day.  Taking vitamin and mineral supplements as recommended by your health care provider. What happens during an annual well check? The services and screenings done by your health care provider during your annual well check will depend on your age, overall health, lifestyle risk factors, and family history of disease. Counseling  Your health care provider may ask you questions about your:  Alcohol use.  Tobacco use.  Drug use.  Emotional well-being.  Home and relationship well-being.  Sexual activity.  Eating habits.  History of falls.  Memory and ability to understand (cognition).  Work and work Statistician.  Reproductive health. Screening  You may have the following tests or measurements:  Height, weight, and BMI.  Blood pressure.  Lipid and cholesterol levels. These may be checked every 5 years, or more frequently if you are over 70 years old.  Skin check.  Lung cancer screening. You may have this screening every year starting at age 19 if you have a 30-pack-year history of smoking and currently smoke or have quit within the past 15 years.  Fecal occult blood test (FOBT) of the stool. You may have this test every year starting at age 30.  Flexible sigmoidoscopy or colonoscopy. You may have a sigmoidoscopy every 5 years or a colonoscopy every 10 years starting at age 83.  Hepatitis C blood test.  Hepatitis  B blood test.  Sexually transmitted disease (STD) testing.  Diabetes screening. This is done by checking your blood sugar (glucose) after you have not eaten for a while (fasting). You may have this done every 1-3 years.  Bone density scan. This is done to screen for osteoporosis. You may have this done starting at age 82.  Mammogram. This  may be done every 1-2 years. Talk to your health care provider about how often you should have regular mammograms. Talk with your health care provider about your test results, treatment options, and if necessary, the need for more tests. Vaccines  Your health care provider may recommend certain vaccines, such as:  Influenza vaccine. This is recommended every year.  Tetanus, diphtheria, and acellular pertussis (Tdap, Td) vaccine. You may need a Td booster every 10 years.  Zoster vaccine. You may need this after age 49.  Pneumococcal 13-valent conjugate (PCV13) vaccine. One dose is recommended after age 100.  Pneumococcal polysaccharide (PPSV23) vaccine. One dose is recommended after age 25. Talk to your health care provider about which screenings and vaccines you need and how often you need them. This information is not intended to replace advice given to you by your health care provider. Make sure you discuss any questions you have with your health care provider. Document Released: 03/17/2015 Document Revised: 11/08/2015 Document Reviewed: 12/20/2014 Elsevier Interactive Patient Education  2017 [place] Prevention in the Home Falls can cause injuries. They can happen to people of all ages. There are many things you can do to make your home safe and to help prevent falls. What can I do on the outside of my home?  Regularly fix the edges of walkways and driveways and fix any cracks.  Remove anything that might make you trip as you walk through a door, such as a raised step or threshold.  Trim any bushes or trees on the path to your home.  Use bright outdoor lighting.  Clear any walking paths of anything that might make someone trip, such as rocks or tools.  Regularly check to see if handrails are loose or broken. Make sure that both sides of any steps have handrails.  Any raised decks and porches should have guardrails on the edges.  Have any leaves, snow, or ice cleared  regularly.  Use sand or salt on walking paths during winter.  Clean up any spills in your garage right away. This includes oil or grease spills. What can I do in the bathroom?  Use night lights.  Install grab bars by the toilet and in the tub and shower. Do not use towel bars as grab bars.  Use non-skid mats or decals in the tub or shower.  If you need to sit down in the shower, use a plastic, non-slip stool.  Keep the floor dry. Clean up any water that spills on the floor as soon as it happens.  Remove soap buildup in the tub or shower regularly.  Attach bath mats securely with double-sided non-slip rug tape.  Do not have throw rugs and other things on the floor that can make you trip. What can I do in the bedroom?  Use night lights.  Make sure that you have a light by your bed that is easy to reach.  Do not use any sheets or blankets that are too big for your bed. They should not hang down onto the floor.  Have a firm chair that has side arms. You can use this for  support while you get dressed.  Do not have throw rugs and other things on the floor that can make you trip. What can I do in the kitchen?  Clean up any spills right away.  Avoid walking on wet floors.  Keep items that you use a lot in easy-to-reach places.  If you need to reach something above you, use a strong step stool that has a grab bar.  Keep electrical cords out of the way.  Do not use floor polish or wax that makes floors slippery. If you must use wax, use non-skid floor wax.  Do not have throw rugs and other things on the floor that can make you trip. What can I do with my stairs?  Do not leave any items on the stairs.  Make sure that there are handrails on both sides of the stairs and use them. Fix handrails that are broken or loose. Make sure that handrails are as long as the stairways.  Check any carpeting to make sure that it is firmly attached to the stairs. Fix any carpet that is loose  or worn.  Avoid having throw rugs at the top or bottom of the stairs. If you do have throw rugs, attach them to the floor with carpet tape.  Make sure that you have a light switch at the top of the stairs and the bottom of the stairs. If you do not have them, ask someone to add them for you. What else can I do to help prevent falls?  Wear shoes that:  Do not have high heels.  Have rubber bottoms.  Are comfortable and fit you well.  Are closed at the toe. Do not wear sandals.  If you use a stepladder:  Make sure that it is fully opened. Do not climb a closed stepladder.  Make sure that both sides of the stepladder are locked into place.  Ask someone to hold it for you, if possible.  Clearly mark and make sure that you can see:  Any grab bars or handrails.  First and last steps.  Where the edge of each step is.  Use tools that help you move around (mobility aids) if they are needed. These include:  Canes.  Walkers.  Scooters.  Crutches.  Turn on the lights when you go into a dark area. Replace any light bulbs as soon as they burn out.  Set up your furniture so you have a clear path. Avoid moving your furniture around.  If any of your floors are uneven, fix them.  If there are any pets around you, be aware of where they are.  Review your medicines with your doctor. Some medicines can make you feel dizzy. This can increase your chance of falling. Ask your doctor what other things that you can do to help prevent falls. This information is not intended to replace advice given to you by your health care provider. Make sure you discuss any questions you have with your health care provider. Document Released: 12/15/2008 Document Revised: 07/27/2015 Document Reviewed: 03/25/2014 Elsevier Interactive Patient Education  2017 [employer].

## 2020-02-29 NOTE — Progress Notes (Signed)
Subjective:   [name] is a 79 y.o. female who presents for Medicare Annual (Subsequent) preventive examination.  Review of Systems    No ROS.  Medicare Wellness Virtual Visit.   Cardiac Risk Factors include: advanced age (>65men, >70 women);hypertension     Objective:    Today's Vitals   02/29/20 1135  BP: (!) 94/51  Pulse: 76  Temp: 97.7 F (36.5 C)  Weight: 139 lb (63 kg)  Height: 5' (1.524 m)   Body mass index is 27.15 kg/m.  Advanced Directives 02/29/2020 12/28/2018 11/03/2014 10/25/2014  Does Patient Have a Medical Advance Directive? Yes Yes Yes No  Type of Paramedic of [place];Living will [place];Living will [place];Living will -  Does patient want to make changes to medical advance directive? No - Patient declined No - Patient declined No - Patient declined -  Copy of [place] in Chart? No - copy requested No - copy requested No - copy requested -  Would patient like information on creating a medical advance directive? - - - No - patient declined information    Current Medications (verified) Outpatient Encounter Medications as of 02/29/2020  Medication Sig  . albuterol (PROVENTIL HFA;VENTOLIN HFA) 108 (90 Base) MCG/ACT inhaler Inhale 2 puffs into the lungs every 6 (six) hours as needed for wheezing or shortness of breath.  . ALPRAZolam (XANAX) 0.25 MG tablet Qd to bid prn  . Calcium-Vitamin A-Vitamin D (LIQUID CALCIUM PO) Take by mouth. 1 tablespoon daily  . cholecalciferol (VITAMIN D) 1000 UNITS tablet Take 1,000 Units by mouth daily.  . clopidogrel (PLAVIX) 75 MG tablet Take 1 tablet (75 mg total) by mouth daily.  Marland Kitchen denosumab (PROLIA) 60 MG/ML SOSY injection Inject 60 mg into the skin every 6 (six) months.  Marland Kitchen ipratropium (ATROVENT) 0.06 % nasal spray Place 2 sprays into both nostrils 3 (three) times daily. (Patient taking differently: Place 2 sprays into both nostrils daily.  )  . metoprolol succinate (TOPROL-XL) 25 MG 24 hr tablet Take 1 tablet (25 mg total) by mouth daily.  . ondansetron (ZOFRAN-ODT) 4 MG disintegrating tablet TAKE 1 TABLET BY MOUTH TWICE A DAY AS NEEDED FOR NAUSEA AND VOMITING appt further refills no exceptions  . potassium chloride (KLOR-CON M10) 10 MEQ tablet TAKE 1 TABLET (10 MEQ TOTAL) BY MOUTH DAILY. EXCEPT FOR SUNDAY  . rosuvastatin (CRESTOR) 40 MG tablet Take 1 tablet (40 mg total) by mouth daily at 6 PM.  . sacubitril-valsartan (ENTRESTO) 24-26 MG Take 1 tablet by mouth 2 (two) times daily.  Marland Kitchen torsemide (DEMADEX) 20 MG tablet TAKE 1 TABLET (20 MG TOTAL) BY MOUTH DAILY. EXCEPT ON SUNDAYS.   No facility-administered encounter medications on file as of 02/29/2020.    Allergies (verified) Sugar-protein-starch   History: Past Medical History:  Diagnosis Date  . Anxiety   . Arthritis   . CAD (coronary artery disease)    a. cardiac cath 10/2014: ostial LAD 30%, prox LAD 90% s/p PCI/DES, ostial RCA 30%, mid RCA 90% s/p PCI/DES, ostial Ramus 50%  . Chronic back pain   . Chronic fatigue   . Chronic systolic CHF (congestive heart failure) ([place])    a. echo 10/2014: EF 20%, AK inf, inf-sep, distal ant, & apical walls, HK elsewhere. Cavity size mildly dilated. Wall thickness nl. mild AI. Mild to mod MR. LA mildly dilated. RV sys fxn mod to severely reduced. small pericardial effusion was ID'd.   . Hepatitis C   .  Ischemic cardiomyopathy   . Osteoporosis   . Systolic CHF, chronic ([place])   . UTI (lower urinary tract infection)    Past Surgical History:  Procedure Laterality Date  . BACK SURGERY  07/14   T10, L1  . CARDIAC CATHETERIZATION Bilateral 10/27/2014   Procedure: Left Heart Cath and Coronary Angiography;  Surgeon: Minna Merritts, MD;  Location: [place] [facility];  Service: Cardiovascular;  Laterality: Bilateral;  . CARDIAC CATHETERIZATION N/A 10/27/2014   Procedure: Coronary Stent Intervention;  Surgeon: Wellington Hampshire, MD;   Location: [facility];  Service: Cardiovascular;  Laterality: N/A;   Family History  Problem Relation Age of Onset  . Arthritis Mother   . Heart disease Father        passed at 29  . Diabetes Father    Social History   Socioeconomic History  . Marital status: Married    Spouse name: Grisela Mesch  . Number of children: 0  . Years of education: 66  . Highest education level: Not on file  Occupational History  . Occupation: Receptionist    Comment: Retired in [place]  . Smoking status: Former Smoker    Packs/day: 1.00    Years: 10.00    Pack years: 10.00  . Smokeless tobacco: Never Used  Substance and Sexual Activity  . Alcohol use: No    Alcohol/week: 0.0 standard drinks  . Drug use: No  . Sexual activity: Yes    Birth control/protection: Post-menopausal  Other Topics Concern  . Not on file  Social History Narrative   Pt is 79 yo female, married to husband of 21 years.  Pt has no children.  Pt lives with husband and teacup poodle, [name]. 25 y.o as of 01/01/19.  Pt states she hurt her back last year and has not been able to participate in much physical activity since. Pt enjoys traveling with her husband in motor home, doing crossword puzzles with husband, and playing cards with friends.  Pt was raised in [facility] [place] [facility] and has lived in [place] entire adult life.  Pt worked in [employer] in [place] until [place] when they closed.     Social Determinants of Health   Financial Resource Strain: Low Risk   . Difficulty of Paying Living Expenses: Not hard at all  Food Insecurity: No Food Insecurity  . Worried About Charity fundraiser in the Last Year: Never true  . Ran Out of Food in the Last Year: Never true  Transportation Needs: No Transportation Needs  . Lack of Transportation (Medical): No  . Lack of Transportation (Non-Medical): No  Physical Activity: Insufficiently Active  . Days of Exercise per Week: 5 days  . Minutes of Exercise per Session: 10  min  Stress: No Stress Concern Present  . Feeling of Stress : Not at all  Social Connections: Unknown  . Frequency of Communication with Friends and Family: Not on file  . Frequency of Social Gatherings with Friends and Family: Not on file  . Attends Religious Services: Not on file  . Active Member of Clubs or Organizations: Not on file  . Attends Archivist Meetings: Not on file  . Marital Status: Married    Tobacco Counseling Counseling given: Not Answered   Clinical Intake:  Pre-visit preparation completed: Yes        Diabetes: No  How often do you need to have someone help you when you read instructions, pamphlets, or other written materials  from your doctor or pharmacy?: 1 - Never   Interpreter Needed?: No      Activities of Daily Living In your present state of health, do you have any difficulty performing the following activities: 02/29/2020  Hearing? N  Vision? N  Difficulty concentrating or making decisions? N  Walking or climbing stairs? Y  Comment Unsteady gait. Walker in use.  Dressing or bathing? N  Doing errands, shopping? N  Preparing Food and eating ? Y  Comment Husband assists  Using the Toilet? N  In the past six months, have you accidently leaked urine? N  Do you have problems with loss of bowel control? N  Managing your Medications? N  Managing your Finances? Y  Comment Husband assists  Housekeeping or managing your Housekeeping? Y  Comment Husband assists  Some recent data might be hidden    Patient Care Team: McLean-Scocuzza, Nino Glow, MD as PCP - General (Internal Medicine) Alisa Graff, FNP as Nurse Practitioner (Cardiology) Minna Merritts, MD as Consulting Physician (Cardiology)  Indicate any recent Medical Services you may have received from other than [facility] providers in the past year (date may be approximate).     Assessment:   This is a routine wellness examination for Elbony.  I connected with Anatalia today  by telephone and verified that I am speaking with the correct person using two identifiers. Location patient: home Location provider: work Persons participating in the virtual visit: patient, Marine scientist.    I discussed the limitations, risks, security and privacy concerns of performing an evaluation and management service by telephone and the availability of in person appointments. The patient expressed understanding and verbally consented to this telephonic visit.    Interactive audio and video telecommunications were attempted between this provider and patient, however failed, due to patient having technical difficulties OR patient did not have access to video capability.  We continued and completed visit with audio only.  Some vital signs may be absent or patient reported.   Hearing/Vision screen  Hearing Screening   125Hz  250Hz  500Hz  1000Hz  2000Hz  3000Hz  4000Hz  6000Hz  8000Hz   Right ear:           Left ear:           Comments: Patient is able to hear conversational tones without difficulty.  No issues reported.  Vision Screening Comments: Wears corrective lenses  Visual acuity not assessed, virtual visit.   Dietary issues and exercise activities discussed: Current Exercise Habits: Home exercise routine, Type of exercise: treadmill;stretching, Time (Minutes): 10, Frequency (Times/Week): 5, Weekly Exercise (Minutes/Week): 50, Intensity: Mild  Healthy diet  Good water intake  Goals    . Increase physical activity      Depression Screen PHQ 2/9 Scores 02/29/2020 12/28/2018 05/14/2017 11/18/2016 08/04/2015 11/03/2014 03/22/2014  PHQ - 2 Score 0 0 0 0 0 0 0    Fall Risk Fall Risk  02/29/2020 07/08/2019 12/28/2018 10/02/2018 05/14/2017  Falls in the past year? 0 0 0 0 No  Comment - - - Emmi Telephone Survey: data to providers prior to load -  Number falls in past yr: 0 0 0 - -  Injury with Fall? 0 0 - - -  Risk for fall due to : - Impaired balance/gait;Impaired mobility - - -  Follow up Falls  evaluation completed Falls evaluation completed - - -    FALL RISK PREVENTION PERTAINING TO THE HOME: Handrails in use when climbing stairs? Yes Home free of loose throw rugs in walkways, pet beds,  electrical cords, etc? Yes  Adequate lighting in your home to reduce risk of falls? Yes   ASSISTIVE DEVICES UTILIZED TO PREVENT FALLS: Use of a cane, walker or w/c? Yes  Grab bars in the bathroom? Yes  Shower chair or bench in shower? Yes  Elevated toilet seat or a handicapped toilet? Yes   TIMED UP AND GO: Was the test performed? No . Virtual visit.   Cognitive Function:     6CIT Screen 02/29/2020 12/28/2018  What Year? 0 points 0 points  What month? 0 points 0 points  What time? 0 points 0 points  Count back from 20 - 0 points  Months in reverse 0 points 0 points  Repeat phrase - 0 points  Total Score - 0    Immunizations Immunization History  Administered Date(s) Administered  . Influenza Split 11/05/2012, 11/16/2013  . Influenza, High Dose Seasonal PF 11/20/2016, 11/12/2017, 11/19/2018  . Influenza-Unspecified 11/23/2014, 11/26/2019  . PFIZER SARS-COV-2 Vaccination 03/30/2019, 04/20/2019, 12/10/2019  . Pneumococcal Conjugate-13 02/06/2015  . Pneumococcal Polysaccharide-23 03/04/2010, 05/14/2017   TDAP status: Due, Education has been provided regarding the importance of this vaccine. Advised may receive this vaccine at local pharmacy or Health Dept. Aware to provide a copy of the vaccination record if obtained from local pharmacy or Health Dept. Verbalized acceptance and understanding. Deferred.  Health Maintenance Health Maintenance  Topic Date Due  . TETANUS/TDAP  02/28/2021 (Originally 09/22/1959)  . INFLUENZA VACCINE  Completed  . DEXA SCAN  Completed  . COVID-19 Vaccine  Completed  . Hepatitis C Screening  Completed  . PNA vac Low Risk Adult  Completed  . URINE MICROALBUMIN  Discontinued   Colorectal cancer screening: No longer required.   Mammogram status: No  longer required due to patient preference..  Bone Density status: Completed 01/31/16. Results reflect: Bone density results: OSTEOPOROSIS. Repeat every 2 years. denosumab (PROLIA) 60 MG/ML SOSY injection  Lung Cancer Screening: (Low Dose CT Chest recommended if Age 36-80 years, 30 pack-year currently smoking OR have quit w/in 15years.) does not qualify.   Hepatitis C Screening: Completed 11/18/16.   Vision Screening: Recommended annual ophthalmology exams for early detection of glaucoma and other disorders of the eye. Is the patient up to date with their annual eye exam?  Yes  Who is the provider or what is the name of the office in which the patient attends annual eye exams? [facility].  Dental Screening: Recommended annual dental exams for proper oral hygiene.  Community Resource Referral / Chronic Care Management: CRR required this visit?  No   CCM required this visit?  No      Plan:   Keep all routine maintenance appointments.   I have personally reviewed and noted the following in the patient's chart:   . Medical and social history . Use of alcohol, tobacco or illicit drugs  . Current medications and supplements . Functional ability and status . Nutritional status . Physical activity . Advanced directives . List of other physicians . Hospitalizations, surgeries, and ER visits in previous 12 months . Vitals . Screenings to include cognitive, depression, and falls . Referrals and appointments  In addition, I have reviewed and discussed with patient certain preventive protocols, quality metrics, and best practice recommendations. A written personalized care plan for preventive services as well as general preventive health recommendations were provided to patient via mychart.     Varney Biles, LPN   [id number]

## 2020-03-11 ENCOUNTER — Other Ambulatory Visit: Payer: Self-pay | Admitting: *Deleted

## 2020-03-11 NOTE — Telephone Encounter (Signed)
Received a refill request for Citalopram 10mg  daily from [facility] [address]   Medication not on med list  Please advise

## 2020-03-13 MED ORDER — CITALOPRAM HYDROBROMIDE 10 MG PO TABS
10.0000 mg | ORAL_TABLET | Freq: Every day | ORAL | 3 refills | Status: DC
Start: 2020-03-13 — End: 2020-06-27

## 2020-05-30 NOTE — Progress Notes (Signed)
Date:  05/31/2020   ID:  ANNALEIGH KNOCHE, DOB 11-18-1940, MRN [id number]  Patient Location:  [place] [address]   Provider location:   [facility], [place] office  PCP:  McLean-Scocuzza, Nino Glow, MD  Cardiologist:  Patsy Baltimore  Chief Complaint  Patient presents with  . Follow-up    6 month F/U    History of Present Illness:    [name] is a 80 y.o. female  past medical history of hepatitis C,  osteoporosis,  arthritis,  chronic back pain s/p multiple fusions,  chronic UTI's,  ischemic cardiomyopathy with ejection fraction 25% on echocardiogram Mitral valve: There was moderate to severe regurgitation  November 2016, coronary artery disease with PCI to the mid RCA,  proximal LAD August 2016,  Significant medication intolerances Previously not interested in defibrillator or repeat echocardiogram EF in 06/2017, 20 to 25% LBBB who presents for routine follow-up of her cardiomyopathy, CAD  LOV 11/2019  Last echo 2019 low EF 20 to 25%  In follow-up today reports that she is very sedentary Has been staying inside, does not like to go out out of fear of Covid  Legs give out when she tries to walk Husband would like to travel, go to [place]  No edema, denies shortness of breath, no chest pain Taking torsemide daily  Weight stable over the past year Reports that her husband does everything for her Chronic back pain, walks with a cane, wheelchair  No recent lab work  Prior lab work reviewed CR 1.29, BUN 44  EKG personally reviewed by myself on todays visit Shows normal sinus rhythm rate 73 bpm left bundle branch block dating back to 2015  No past medical history reviewed  echocardiogram April 2019 showed severely depressed LV function, no improvement from 2018  She previously felt Plavix was causing her nose to run.  Also Previously she felt Lasix was causing her runny nose.now taking Lasix, not taking  torsemide  She was unable to tolerate brilinta She washaving side effects from the entresto, only able to tolerate 1/2 dose of the 24/26 mg pill once a day in the evening. Able to tolerate metoprolol, Lipitor, potassium  She is not interested in defibrillator. We have had previous discussions She did consider applying external defibrillator to put in their house  Other past medical history reviewed [facility] 10/25/14 to 8/26 for new onset acute systolic CHF, ischemic cardiomyopathy, acute respiratory distress with hypoxia, and newly diagnosed CAD s/p PCI/DES to mid RCA and prox LAD.    Admitted to [facility] in August with acute respiraotry destress with hypoxia   Echo August 2016 showed EF of 20%, akinesis of the inferior, inferoseptal, distal anterior, and apical walls. There was hypokinesis elsewhere. Cavity size was mildly dilated. Wall thickness was normal. Mild aortic regurgitation. Mild to moderate mitral regurgitation. Left atrium was mildly dilated. Right ventricle systolic was was moderate to severely reduced. A small pericardial effusion was seen.   cardiac cath on 8/25 that showed ostial LAD 30%, prox LAD 90%, ostial RCA 30%, mid RCA 90%, ostial Ramus 50%.  She underwent successful PCI/DES to the proximal LAD and mid RCA with Xience DES. She was started on aspirin and Brilinta.   she could not tolerated Brilinta 2/2 SOB. She was changed to Plavix.    Prior CV studies:   The following studies were reviewed today:   Past Medical History:  Diagnosis Date  . Anxiety   .  Arthritis   . CAD (coronary artery disease)    a. cardiac cath 10/2014: ostial LAD 30%, prox LAD 90% s/p PCI/DES, ostial RCA 30%, mid RCA 90% s/p PCI/DES, ostial Ramus 50%  . Chronic back pain   . Chronic fatigue   . Chronic systolic CHF (congestive heart failure) ([place])    a. echo 10/2014: EF 20%, AK inf, inf-sep, distal ant, & apical walls, HK elsewhere. Cavity size mildly dilated. Wall thickness nl. mild AI.  Mild to mod MR. LA mildly dilated. RV sys fxn mod to severely reduced. small pericardial effusion was ID'd.   . Hepatitis C   . Ischemic cardiomyopathy   . Osteoporosis   . Systolic CHF, chronic ([place])   . UTI (lower urinary tract infection)    Past Surgical History:  Procedure Laterality Date  . BACK SURGERY  07/14   T10, L1  . CARDIAC CATHETERIZATION Bilateral 10/27/2014   Procedure: Left Heart Cath and Coronary Angiography;  Surgeon: Minna Merritts, MD;  Location: [place] [facility];  Service: Cardiovascular;  Laterality: Bilateral;  . CARDIAC CATHETERIZATION N/A 10/27/2014   Procedure: Coronary Stent Intervention;  Surgeon: Wellington Hampshire, MD;  Location: [place] [facility];  Service: Cardiovascular;  Laterality: N/A;     Current Meds  Medication Sig  . ALPRAZolam (XANAX) 0.25 MG tablet Qd to bid prn  . Calcium-Vitamin A-Vitamin D (LIQUID CALCIUM PO) Take by mouth. 1 tablespoon daily  . citalopram (CELEXA) 10 MG tablet Take 1 tablet (10 mg total) by mouth daily.  . clopidogrel (PLAVIX) 75 MG tablet Take 1 tablet (75 mg total) by mouth daily.  Marland Kitchen denosumab (PROLIA) 60 MG/ML SOSY injection Inject 60 mg into the skin every 6 (six) months.  Marland Kitchen ipratropium (ATROVENT) 0.06 % nasal spray Place 2 sprays into both nostrils daily.  . metoprolol succinate (TOPROL-XL) 25 MG 24 hr tablet Take 1 tablet (25 mg total) by mouth daily.  . ondansetron (ZOFRAN-ODT) 4 MG disintegrating tablet TAKE 1 TABLET BY MOUTH TWICE A DAY AS NEEDED FOR NAUSEA AND VOMITING appt further refills no exceptions  . potassium chloride (KLOR-CON M10) 10 MEQ tablet TAKE 1 TABLET (10 MEQ TOTAL) BY MOUTH DAILY. EXCEPT FOR SUNDAY  . rosuvastatin (CRESTOR) 40 MG tablet Take 1 tablet (40 mg total) by mouth daily at 6 PM.  . sacubitril-valsartan (ENTRESTO) 24-26 MG Take 1 tablet by mouth 2 (two) times daily.  Marland Kitchen torsemide (DEMADEX) 20 MG tablet TAKE 1 TABLET (20 MG TOTAL) BY MOUTH DAILY. EXCEPT ON SUNDAYS.     Allergies:    Sugar-protein-starch   Social History   Tobacco Use  . Smoking status: Former Smoker    Packs/day: 1.00    Years: 10.00    Pack years: 10.00  . Smokeless tobacco: Never Used  Vaping Use  . Vaping Use: Never used  Substance Use Topics  . Alcohol use: No    Alcohol/week: 0.0 standard drinks  . Drug use: No      Family Hx: The patient's family history includes Arthritis in her mother; Diabetes in her father; Heart disease in her father.  ROS:   Please see the history of present illness.    Review of Systems  Constitutional: Negative.   HENT: Negative.   Respiratory: Negative.   Cardiovascular: Negative.   Gastrointestinal: Negative.   Musculoskeletal: Negative.        Leg weakness  Neurological: Negative.   Psychiatric/Behavioral: Negative.   All other systems reviewed and are negative.  Labs/Other Tests and Data Reviewed:    Recent Labs: 07/08/2019: ALT 15; BUN 44; Creatinine, Ser 1.29; Hemoglobin 13.3; Magnesium 2.5; Platelets 210.0; Potassium 4.7; Sodium 138   Recent Lipid Panel Lab Results  Component Value Date/Time   CHOL 124 01/05/2019 10:52 AM   TRIG 201.0 (H) 01/05/2019 10:52 AM   HDL 43.20 01/05/2019 10:52 AM   CHOLHDL 3 01/05/2019 10:52 AM   LDLCALC 49 10/29/2016 09:41 AM   LDLDIRECT 52.0 01/05/2019 10:52 AM    Wt Readings from Last 3 Encounters:  05/31/20 140 lb (63.5 kg)  02/29/20 139 lb (63 kg)  11/10/19 139 lb (63 kg)     Exam:    Vital Signs: Vital signs may also be detailed in the HPI BP 126/70 (BP Location: Right Arm, Patient Position: Sitting, Cuff Size: Normal)   Pulse 73   Ht 5' (1.524 m)   Wt 140 lb (63.5 kg)   SpO2 97%   BMI 27.34 kg/m   Constitutional:  oriented to person, place, and time. No distress.  HENT:  Head: Grossly normal Eyes:  no discharge. No scleral icterus.  Neck: No JVD, no carotid bruits  Cardiovascular: Regular rate and rhythm, no murmurs appreciated Pulmonary/Chest: Clear to auscultation bilaterally, no  wheezes or rails Abdominal: Soft.  no distension.  no tenderness.  Musculoskeletal: Normal range of motion Neurological:  normal muscle tone. Coordination normal. No atrophy Skin: Skin warm and dry Psychiatric: normal affect, pleasant   ASSESSMENT & PLAN:    Coronary artery disease of native artery of native heart with stable angina pectoris (HCC) -  Currently with no symptoms of angina. No further workup at this time. Continue current medication regimen.  Ischemic cardiomyopathy - EF 20 to 25% Previously declined additional work-up including ICD Previously declined repeat echo Sometimes takes Entresto once a day, other days twice a day No medication changes made, appears relatively euvolemic, routine lab work done today  Chronic systolic CHF (congestive heart failure) ([place]) -  Appears euvolemic, prior lab work creatinine high end of her range, repeat labs today  Mixed hyperlipidemia -  Cholesterol is at goal on the current lipid regimen. No changes to the medications were made. Lipids ordered today  Essential hypertension -  No medication changes made, no orthostasis  Anxiety -  Stable, husband helping with everything   Total encounter time more than 25 minutes  Greater than 50% was spent in counseling and coordination of care with the patient    Signed, Ida Rogue, MD  05/31/2020 11:33 AM    [place] [facility] [address], [place], [place] [address]

## 2020-05-31 ENCOUNTER — Encounter: Payer: Self-pay | Admitting: Cardiovascular Disease

## 2020-05-31 ENCOUNTER — Ambulatory Visit: Payer: Medicare HMO | Admitting: Cardiovascular Disease

## 2020-05-31 ENCOUNTER — Other Ambulatory Visit: Payer: Self-pay

## 2020-05-31 VITALS — BP 126/70 | HR 73 | Ht 60.0 in | Wt 140.0 lb

## 2020-05-31 DIAGNOSIS — I255 Ischemic cardiomyopathy: Secondary | ICD-10-CM | POA: Diagnosis not present

## 2020-05-31 DIAGNOSIS — I5022 Chronic systolic (congestive) heart failure: Secondary | ICD-10-CM | POA: Diagnosis not present

## 2020-05-31 DIAGNOSIS — J449 Chronic obstructive pulmonary disease, unspecified: Secondary | ICD-10-CM | POA: Diagnosis not present

## 2020-05-31 DIAGNOSIS — E782 Mixed hyperlipidemia: Secondary | ICD-10-CM

## 2020-05-31 DIAGNOSIS — I25118 Atherosclerotic heart disease of native coronary artery with other forms of angina pectoris: Secondary | ICD-10-CM

## 2020-05-31 DIAGNOSIS — I10 Essential (primary) hypertension: Secondary | ICD-10-CM

## 2020-05-31 NOTE — Patient Instructions (Addendum)
Medication Instructions:  No changes  Lab work: CMP, lipids  YOU CAN REVIEW YOUR LABS ON MYCHART, WE WILL CALL WITH ANY ABNORMAL RESULTS   Testing/Procedures: No new testing needed   Follow-Up:  . You will need a follow up appointment in 12 months  . Providers on your designated Care Team:   . Murray Hodgkins, NP . Christell Faith, PA-C . Marrianne Mood, PA-C

## 2020-06-01 LAB — COMPREHENSIVE METABOLIC PANEL
ALT: 18 IU/L (ref 0–32)
AST: 27 IU/L (ref 0–40)
Albumin/Globulin Ratio: 2 (ref 1.2–2.2)
Albumin: 4.8 g/dL — ABNORMAL HIGH (ref 3.7–4.7)
Alkaline Phosphatase: 63 IU/L (ref 44–121)
BUN/Creatinine Ratio: 21 (ref 12–28)
BUN: 26 mg/dL (ref 8–27)
Bilirubin Total: 0.4 mg/dL (ref 0.0–1.2)
CO2: 24 mmol/L (ref 20–29)
Calcium: 10.3 mg/dL (ref 8.7–10.3)
Chloride: 100 mmol/L (ref 96–106)
Creatinine, Ser: 1.26 mg/dL — ABNORMAL HIGH (ref 0.57–1.00)
Globulin, Total: 2.4 g/dL (ref 1.5–4.5)
Glucose: 94 mg/dL (ref 65–99)
Potassium: 5.5 mmol/L — ABNORMAL HIGH (ref 3.5–5.2)
Sodium: 141 mmol/L (ref 134–144)
Total Protein: 7.2 g/dL (ref 6.0–8.5)
eGFR: 44 mL/min/{1.73_m2} — ABNORMAL LOW (ref >59)

## 2020-06-01 LAB — LIPID PANEL
Chol/HDL Ratio: 2.9 ratio (ref 0.0–4.4)
Cholesterol, Total: 142 mg/dL (ref 100–199)
HDL: 49 mg/dL (ref >39)
LDL Chol Calc (NIH): 56 mg/dL (ref 0–99)
Triglycerides: 233 mg/dL — ABNORMAL HIGH (ref 0–149)
VLDL Cholesterol Cal: 37 mg/dL (ref 5–40)

## 2020-06-02 ENCOUNTER — Telehealth: Payer: Self-pay

## 2020-06-02 NOTE — Telephone Encounter (Signed)
-----   Message from Minna Merritts, MD sent at 06/02/2020  9:09 AM EDT ----- Could hold potassium supplements Recheck bmp one month

## 2020-06-02 NOTE — Telephone Encounter (Signed)
Patient made aware of lab results and Dr. Donivan Scull recommendation.  "Could hold potassium supplements  Recheck bmp one month"    Patient sts that she would prefer to have her labs drawn at her pcp's office. Lab order for a bmp placed in Epic.  Patient sts that she will contacts her pcp's office to schedule 4 wk repeat bmp.

## 2020-06-05 ENCOUNTER — Encounter: Payer: Self-pay | Admitting: *Deleted

## 2020-06-05 ENCOUNTER — Telehealth: Payer: Self-pay | Admitting: Internal Medicine

## 2020-06-05 ENCOUNTER — Telehealth: Payer: Self-pay | Admitting: Cardiovascular Disease

## 2020-06-05 NOTE — Telephone Encounter (Signed)
Pt called and wanted to know if Proila was cover under her new insurance  She is do for the injection now

## 2020-06-05 NOTE — Telephone Encounter (Signed)
Patient calling States that she is suppose to have some labs rechecked at her PCP office  Please call to discuss

## 2020-06-05 NOTE — Telephone Encounter (Signed)
Was able to return pt's call, she wanted [place] to reach out to [facility], her PCP to have them recheck her potassium in one month as instructed d/t high potassium levels, and instructed to hold potassium supplements. BMET for one month out, sent secure message to her PCP Dr. Terese Door and her clinical pool to see if they can schedule pt for a BMET within the upcoming month.   Pt grateful for the return call and reaching out to her PCP, if anymore concerns she will call back.

## 2020-06-05 NOTE — Telephone Encounter (Signed)
Sent My chart with this response.   Hi Ms. Spar, You are due for your Prolia injection 06/15/20. If you have different insurance than last year we would need your current insurance information. You can up load via My chart or bring copy of your card by the office.  Thanks Lindalou Hose RN

## 2020-06-08 ENCOUNTER — Encounter: Payer: Self-pay | Admitting: *Deleted

## 2020-06-11 ENCOUNTER — Other Ambulatory Visit: Payer: Self-pay | Admitting: Internal Medicine

## 2020-06-11 DIAGNOSIS — Z0000 Encounter for general adult medical examination without abnormal findings: Secondary | ICD-10-CM

## 2020-06-11 DIAGNOSIS — R7303 Prediabetes: Secondary | ICD-10-CM

## 2020-06-11 DIAGNOSIS — Z1329 Encounter for screening for other suspected endocrine disorder: Secondary | ICD-10-CM

## 2020-06-11 DIAGNOSIS — Z1389 Encounter for screening for other disorder: Secondary | ICD-10-CM

## 2020-06-11 DIAGNOSIS — E875 Hyperkalemia: Secondary | ICD-10-CM

## 2020-06-11 NOTE — Addendum Note (Signed)
Addended by: Orland Mustard on: 06/11/2020 10:14 PM   Modules accepted: Orders

## 2020-06-12 NOTE — Telephone Encounter (Signed)
Prolia approved patient scheduled.

## 2020-06-21 ENCOUNTER — Other Ambulatory Visit: Payer: Self-pay

## 2020-06-21 ENCOUNTER — Ambulatory Visit (INDEPENDENT_AMBULATORY_CARE_PROVIDER_SITE_OTHER): Payer: Medicare HMO | Admitting: *Deleted

## 2020-06-21 DIAGNOSIS — Z1389 Encounter for screening for other disorder: Secondary | ICD-10-CM | POA: Diagnosis not present

## 2020-06-21 DIAGNOSIS — E875 Hyperkalemia: Secondary | ICD-10-CM | POA: Diagnosis not present

## 2020-06-21 DIAGNOSIS — Z0000 Encounter for general adult medical examination without abnormal findings: Secondary | ICD-10-CM

## 2020-06-21 DIAGNOSIS — M810 Age-related osteoporosis without current pathological fracture: Secondary | ICD-10-CM

## 2020-06-21 DIAGNOSIS — Z1329 Encounter for screening for other suspected endocrine disorder: Secondary | ICD-10-CM

## 2020-06-21 DIAGNOSIS — R7303 Prediabetes: Secondary | ICD-10-CM | POA: Diagnosis not present

## 2020-06-21 LAB — CBC WITH DIFFERENTIAL/PLATELET
Basophils Absolute: 0 10*3/uL (ref 0.0–0.1)
Basophils Relative: 0.8 % (ref 0.0–3.0)
Eosinophils Absolute: 0 10*3/uL (ref 0.0–0.7)
Eosinophils Relative: 0.6 % (ref 0.0–5.0)
HCT: 42.2 % (ref 36.0–46.0)
Hemoglobin: 14.1 g/dL (ref 12.0–15.0)
Lymphocytes Relative: 18.7 % (ref 12.0–46.0)
Lymphs Abs: 1.2 10*3/uL (ref 0.7–4.0)
MCHC: 33.3 g/dL (ref 30.0–36.0)
MCV: 97.2 fl (ref 78.0–100.0)
Monocytes Absolute: 0.4 10*3/uL (ref 0.1–1.0)
Monocytes Relative: 5.7 % (ref 3.0–12.0)
Neutro Abs: 4.7 10*3/uL (ref 1.4–7.7)
Neutrophils Relative %: 74.2 % (ref 43.0–77.0)
Platelets: 218 10*3/uL (ref 150.0–400.0)
RBC: 4.34 Mil/uL (ref 3.87–5.11)
RDW: 14 % (ref 11.5–15.5)
WBC: 6.4 10*3/uL (ref 4.0–10.5)

## 2020-06-21 LAB — BASIC METABOLIC PANEL
BUN: 20 mg/dL (ref 6–23)
CO2: 25 mEq/L (ref 19–32)
Calcium: 10 mg/dL (ref 8.4–10.5)
Chloride: 103 mEq/L (ref 96–112)
Creatinine, Ser: 1.32 mg/dL — ABNORMAL HIGH (ref 0.40–1.20)
GFR: 38.61 mL/min — ABNORMAL LOW (ref >60.00)
Glucose, Bld: 98 mg/dL (ref 70–99)
Potassium: 4.8 mEq/L (ref 3.5–5.1)
Sodium: 141 mEq/L (ref 135–145)

## 2020-06-21 LAB — HEMOGLOBIN A1C: Hgb A1c MFr Bld: 6 % (ref 4.6–6.5)

## 2020-06-21 LAB — TSH: TSH: 1.53 u[IU]/mL (ref 0.35–4.50)

## 2020-06-21 MED ORDER — DENOSUMAB 60 MG/ML ~~LOC~~ SOSY
60.0000 mg | PREFILLED_SYRINGE | Freq: Once | SUBCUTANEOUS | Status: AC
  Administered 2020-06-21: 60 mg via SUBCUTANEOUS

## 2020-06-21 NOTE — Progress Notes (Signed)
Patient presented for Prolia injection to Right arm Ney, patient voiced no concerns or complaints during or after injection. 

## 2020-06-22 LAB — MICROSCOPIC EXAMINATION
Bacteria, UA: NONE SEEN
Casts: NONE SEEN /lpf

## 2020-06-22 LAB — URINALYSIS, ROUTINE W REFLEX MICROSCOPIC
Bilirubin, UA: NEGATIVE
Glucose, UA: NEGATIVE
Nitrite, UA: NEGATIVE
RBC, UA: NEGATIVE
Specific Gravity, UA: 1.024 (ref 1.005–1.030)
Urobilinogen, Ur: 1 mg/dL (ref 0.2–1.0)
pH, UA: 6.5 (ref 5.0–7.5)

## 2020-06-27 ENCOUNTER — Ambulatory Visit (INDEPENDENT_AMBULATORY_CARE_PROVIDER_SITE_OTHER): Payer: Medicare HMO | Admitting: Internal Medicine

## 2020-06-27 ENCOUNTER — Encounter: Payer: Self-pay | Admitting: Internal Medicine

## 2020-06-27 ENCOUNTER — Other Ambulatory Visit: Payer: Self-pay

## 2020-06-27 VITALS — BP 114/68 | HR 72 | Temp 97.6°F | Ht 60.0 in | Wt 141.0 lb

## 2020-06-27 DIAGNOSIS — I5022 Chronic systolic (congestive) heart failure: Secondary | ICD-10-CM | POA: Diagnosis not present

## 2020-06-27 DIAGNOSIS — R112 Nausea with vomiting, unspecified: Secondary | ICD-10-CM | POA: Diagnosis not present

## 2020-06-27 DIAGNOSIS — Z9181 History of falling: Secondary | ICD-10-CM

## 2020-06-27 DIAGNOSIS — M810 Age-related osteoporosis without current pathological fracture: Secondary | ICD-10-CM | POA: Diagnosis not present

## 2020-06-27 DIAGNOSIS — Z0000 Encounter for general adult medical examination without abnormal findings: Secondary | ICD-10-CM | POA: Insufficient documentation

## 2020-06-27 DIAGNOSIS — R69 Illness, unspecified: Secondary | ICD-10-CM | POA: Diagnosis not present

## 2020-06-27 DIAGNOSIS — R269 Unspecified abnormalities of gait and mobility: Secondary | ICD-10-CM

## 2020-06-27 DIAGNOSIS — F419 Anxiety disorder, unspecified: Secondary | ICD-10-CM

## 2020-06-27 DIAGNOSIS — R5381 Other malaise: Secondary | ICD-10-CM | POA: Diagnosis not present

## 2020-06-27 DIAGNOSIS — N1832 Chronic kidney disease, stage 3b: Secondary | ICD-10-CM

## 2020-06-27 MED ORDER — ONDANSETRON 4 MG PO TBDP
ORAL_TABLET | ORAL | 11 refills | Status: DC
Start: 2020-06-27 — End: 2021-06-25

## 2020-06-27 MED ORDER — ALPRAZOLAM 0.25 MG PO TABS: ORAL_TABLET | ORAL | 2 refills | Status: DC

## 2020-06-27 MED ORDER — CITALOPRAM HYDROBROMIDE 10 MG PO TABS: 10.0000 mg | ORAL_TABLET | Freq: Every day | ORAL | 3 refills | Status: DC

## 2020-06-27 NOTE — Progress Notes (Signed)
Chief Complaint  Patient presents with  . Follow-up   Annual doing well with husband   1. chf systolic chronic stable on entresto 24-26 and torsemide 20 mg qd except sundays stopped potassium due to hyper K and CKD3 declines renal for now  F/u with cards  2. Osteoporosis on prolia Q6 months  Of note taking exipure supplements for weight loss advised she stop this  Review of Systems  Constitutional: Negative for weight loss.  HENT: Negative for hearing loss.   Eyes: Negative for blurred vision.  Respiratory: Negative for shortness of breath.   Cardiovascular: Negative for chest pain.  Gastrointestinal: Negative for abdominal pain and nausea.  Musculoskeletal: Negative for falls and joint pain.  Skin: Negative for rash.  Neurological: Negative for headaches.  Psychiatric/Behavioral: Negative for depression and memory loss.   Past Medical History:  Diagnosis Date  . Anxiety   . Arthritis   . CAD (coronary artery disease)    a. cardiac cath 10/2014: ostial LAD 30%, prox LAD 90% s/p PCI/DES, ostial RCA 30%, mid RCA 90% s/p PCI/DES, ostial Ramus 50%  . Chronic back pain   . Chronic fatigue   . Chronic systolic CHF (congestive heart failure) ([place])    a. echo 10/2014: EF 20%, AK inf, inf-sep, distal ant, & apical walls, HK elsewhere. Cavity size mildly dilated. Wall thickness nl. mild AI. Mild to mod MR. LA mildly dilated. RV sys fxn mod to severely reduced. small pericardial effusion was ID'd.   . Hepatitis C   . Ischemic cardiomyopathy   . Osteoporosis   . Systolic CHF, chronic ([place])   . UTI (lower urinary tract infection)    Past Surgical History:  Procedure Laterality Date  . BACK SURGERY  07/14   T10, L1  . CARDIAC CATHETERIZATION Bilateral 10/27/2014   Procedure: Left Heart Cath and Coronary Angiography;  Surgeon: Minna Merritts, MD;  Location: [facility];  Service: Cardiovascular;  Laterality: Bilateral;  . CARDIAC CATHETERIZATION N/A 10/27/2014   Procedure:  Coronary Stent Intervention;  Surgeon: [place] Hampshire, MD;  Location: [facility];  Service: Cardiovascular;  Laterality: N/A;   Family History  Problem Relation Age of Onset  . Arthritis Mother   . Heart disease Father        passed at 70  . Diabetes Father    Social History   Socioeconomic History  . Marital status: Married    Spouse name: Elanda Garmany  . Number of children: 0  . Years of education: 59  . Highest education level: Not on file  Occupational History  . Occupation: Receptionist    Comment: Retired in [place] Use  . Smoking status: Former Smoker    Packs/day: 1.00    Years: 10.00    Pack years: 10.00  . Smokeless tobacco: Never Used  Vaping Use  . Vaping Use: Never used  Substance and Sexual Activity  . Alcohol use: No    Alcohol/week: 0.0 standard drinks  . Drug use: No  . Sexual activity: Yes    Birth control/protection: Post-menopausal  Other Topics Concern  . Not on file  Social History Narrative   Pt is 80 yo female, married to husband of 21 years.  Pt has no children.  Pt lives with husband and teacup poodle, [name]. 27 y.o as of 01/01/19.  Pt states she hurt her back last year and has not been able to participate in much physical activity since. Pt enjoys traveling with her  husband in motor home, doing crossword puzzles with husband, and playing cards with friends.  Pt was raised in [place] and has lived in [place] entire adult life.  Pt worked in [employer] in [place] until [place] when they closed.     Social Determinants of Health   Financial Resource Strain: Low Risk   . Difficulty of Paying Living Expenses: Not hard at all  Food Insecurity: No Food Insecurity  . Worried About Charity fundraiser in the Last Year: Never true  . Ran Out of Food in the Last Year: Never true  Transportation Needs: No Transportation Needs  . Lack of Transportation (Medical): No  . Lack of Transportation (Non-Medical): No  Physical Activity:  Insufficiently Active  . Days of Exercise per Week: 5 days  . Minutes of Exercise per Session: 10 min  Stress: No Stress Concern Present  . Feeling of Stress : Not at all  Social Connections: Unknown  . Frequency of Communication with Friends and Family: Not on file  . Frequency of Social Gatherings with Friends and Family: Not on file  . Attends Religious Services: Not on file  . Active Member of Clubs or Organizations: Not on file  . Attends Archivist Meetings: Not on file  . Marital Status: Married  Human resources officer Violence: Not At Risk  . Fear of Current or Ex-Partner: No  . Emotionally Abused: No  . Physically Abused: No  . Sexually Abused: No   Current Meds  Medication Sig  . Calcium-Vitamin A-Vitamin D (LIQUID CALCIUM PO) Take by mouth. 1 tablespoon daily  . clopidogrel (PLAVIX) 75 MG tablet Take 1 tablet (75 mg total) by mouth daily.  Marland Kitchen denosumab (PROLIA) 60 MG/ML SOSY injection Inject 60 mg into the skin every 6 (six) months.  Marland Kitchen ipratropium (ATROVENT) 0.06 % nasal spray Place 2 sprays into both nostrils daily.  . metoprolol succinate (TOPROL-XL) 25 MG 24 hr tablet Take 1 tablet (25 mg total) by mouth daily.  . rosuvastatin (CRESTOR) 40 MG tablet Take 1 tablet (40 mg total) by mouth daily at 6 PM.  . sacubitril-valsartan (ENTRESTO) 24-26 MG Take 1 tablet by mouth 2 (two) times daily.  Marland Kitchen torsemide (DEMADEX) 20 MG tablet TAKE 1 TABLET (20 MG TOTAL) BY MOUTH DAILY. EXCEPT ON SUNDAYS.  . [DISCONTINUED] citalopram (CELEXA) 10 MG tablet Take 1 tablet (10 mg total) by mouth daily.  . [DISCONTINUED] ondansetron (ZOFRAN-ODT) 4 MG disintegrating tablet TAKE 1 TABLET BY MOUTH TWICE A DAY AS NEEDED FOR NAUSEA AND VOMITING appt further refills no exceptions   Allergies  Allergen Reactions  . Sugar-Protein-Starch Nausea And Vomiting    Only to sugar   Recent Results (from the past 2160 hour(s))  Comprehensive metabolic panel     Status: Abnormal   Collection Time: 05/31/20  11:57 AM  Result Value Ref Range   Glucose 94 65 - 99 mg/dL   BUN 26 8 - 27 mg/dL   Creatinine, Ser 1.26 (H) 0.57 - 1.00 mg/dL   eGFR 44 (L) >59 mL/min/1.73   BUN/Creatinine Ratio 21 12 - 28   Sodium 141 134 - 144 mmol/L   Potassium 5.5 (H) 3.5 - 5.2 mmol/L   Chloride 100 96 - 106 mmol/L   CO2 24 20 - 29 mmol/L   Calcium 10.3 8.7 - 10.3 mg/dL   Total Protein 7.2 6.0 - 8.5 g/dL   Albumin 4.8 (H) 3.7 - 4.7 g/dL   Globulin, Total 2.4 1.5 - 4.5 g/dL  Albumin/Globulin Ratio 2.0 1.2 - 2.2   Bilirubin Total 0.4 0.0 - 1.2 mg/dL   Alkaline Phosphatase 63 44 - 121 IU/L   AST 27 0 - 40 IU/L   ALT 18 0 - 32 IU/L  Lipid panel     Status: Abnormal   Collection Time: 05/31/20 11:57 AM  Result Value Ref Range   Cholesterol, Total 142 100 - 199 mg/dL   Triglycerides 233 (H) 0 - 149 mg/dL   HDL 49 >39 mg/dL   VLDL Cholesterol Cal 37 5 - 40 mg/dL   LDL Chol Calc (NIH) 56 0 - 99 mg/dL   Chol/HDL Ratio 2.9 0.0 - 4.4 ratio    Comment:                                   T. Chol/HDL Ratio                                             Men  Women                               1/2 Avg.Risk  3.4    3.3                                   Avg.Risk  5.0    4.4                                2X Avg.Risk  9.6    7.1                                3X Avg.Risk 23.4   11.0   Urinalysis, Routine w reflex microscopic     Status: Abnormal   Collection Time: 06/21/20 11:00 AM  Result Value Ref Range   Specific Gravity, UA 1.024 1.005 - 1.030   pH, UA 6.5 5.0 - 7.5   Color, UA Yellow Yellow   Appearance Ur Clear Clear   Leukocytes,UA Trace (A) Negative   Protein,UA 1+ (A) Negative/Trace   Glucose, UA Negative Negative   Ketones, UA Trace (A) Negative   RBC, UA Negative Negative   Bilirubin, UA Negative Negative   Urobilinogen, Ur 1.0 0.2 - 1.0 mg/dL   Nitrite, UA Negative Negative   Microscopic Examination See below:     Comment: Microscopic was indicated and was performed.  TSH     Status: None    Collection Time: 06/21/20 11:00 AM  Result Value Ref Range   TSH 1.53 0.35 - 4.50 uIU/mL  Hemoglobin A1c     Status: None   Collection Time: 06/21/20 11:00 AM  Result Value Ref Range   Hgb A1c MFr Bld 6.0 4.6 - 6.5 %    Comment: Glycemic Control Guidelines for People with Diabetes:Non Diabetic:  <6%Goal of Therapy: <7%Additional Action Suggested:  >8%   CBC with Differential/Platelet     Status: None   Collection Time: 06/21/20 11:00 AM  Result Value Ref Range   WBC 6.4 4.0 - 10.5 K/uL   RBC 4.34 3.87 -  5.11 Mil/uL   Hemoglobin 14.1 12.0 - 15.0 g/dL   HCT 42.2 36.0 - 46.0 %   MCV 97.2 78.0 - 100.0 fl   MCHC 33.3 30.0 - 36.0 g/dL   RDW 14.0 11.5 - 15.5 %   Platelets 218.0 150.0 - 400.0 K/uL   Neutrophils Relative % 74.2 43.0 - 77.0 %   Lymphocytes Relative 18.7 12.0 - 46.0 %   Monocytes Relative 5.7 3.0 - 12.0 %   Eosinophils Relative 0.6 0.0 - 5.0 %   Basophils Relative 0.8 0.0 - 3.0 %   Neutro Abs 4.7 1.4 - 7.7 K/uL   Lymphs Abs 1.2 0.7 - 4.0 K/uL   Monocytes Absolute 0.4 0.1 - 1.0 K/uL   Eosinophils Absolute 0.0 0.0 - 0.7 K/uL   Basophils Absolute 0.0 0.0 - 0.1 K/uL  Basic Metabolic Panel (BMET)     Status: Abnormal   Collection Time: 06/21/20 11:00 AM  Result Value Ref Range   Sodium 141 135 - 145 mEq/L   Potassium 4.8 3.5 - 5.1 mEq/L   Chloride 103 96 - 112 mEq/L   CO2 25 19 - 32 mEq/L   Glucose, Bld 98 70 - 99 mg/dL   BUN 20 6 - 23 mg/dL   Creatinine, Ser 1.32 (H) 0.40 - 1.20 mg/dL   GFR 38.61 (L) >60.00 mL/min    Comment: Calculated using the CKD-EPI Creatinine Equation (2021)   Calcium 10.0 8.4 - 10.5 mg/dL  Microscopic Examination     Status: None   Collection Time: 06/21/20 11:00 AM   Urine  Result Value Ref Range   WBC, UA 0-5 0 - 5 /hpf   RBC 0-2 0 - 2 /hpf   Epithelial Cells (non renal) 0-10 0 - 10 /hpf   Casts None seen None seen /lpf   Bacteria, UA None seen None seen/Few   Objective  Body mass index is 27.54 kg/m. Wt Readings from Last 3  Encounters:  06/27/20 141 lb (64 kg)  05/31/20 140 lb (63.5 kg)  02/29/20 139 lb (63 kg)   Temp Readings from Last 3 Encounters:  06/27/20 97.6 F (36.4 C) (Oral)  02/29/20 97.7 F (36.5 C)  11/10/19 (!) 97.3 F (36.3 C)   BP Readings from Last 3 Encounters:  06/27/20 114/68  05/31/20 126/70  02/29/20 (!) 94/51   Pulse Readings from Last 3 Encounters:  06/27/20 72  05/31/20 73  02/29/20 76    Physical Exam Vitals and nursing note reviewed.  Constitutional:      Appearance: Normal appearance. She is well-developed and well-groomed.  HENT:     Head: Normocephalic and atraumatic.  Cardiovascular:     Rate and Rhythm: Normal rate and regular rhythm.     Heart sounds: Normal heart sounds. No murmur heard.   Pulmonary:     Effort: Pulmonary effort is normal.     Breath sounds: Normal breath sounds.  Musculoskeletal:     Right lower leg: No edema.     Left lower leg: No edema.  Skin:    General: Skin is warm and dry.  Neurological:     General: No focal deficit present.     Mental Status: She is alert and oriented to person, place, and time. Mental status is at baseline.     Gait: Gait normal.     Comments: BL walks with cane  Psychiatric:        Attention and Perception: Attention and perception normal.        Mood  and Affect: Mood and affect normal.        Speech: Speech normal.        Behavior: Behavior normal. Behavior is cooperative.        Thought Content: Thought content normal.        Cognition and Memory: Cognition and memory normal.        Judgment: Judgment normal.     Assessment  Plan  Annual physical exam Had flu shotutd  pna 23 updated  Had prevnar Declines Tdap  Consider shingrix in futuredeclines covid 4/4  Consider fobt vs cologuarddeclines  colonoscopy 2003 declines further  mammo declines last had years ago not in the area and declines Pap out of age window  DEXA 01/31/16 osteoporosis + consider check vit D in futuretry to  add on today. On Prolia. Will needto disccalcium and vit D supplementation in futuremake sure dose is adequate hep c neg 10/29/16 rec healthy diet and exercise   Anxiety - Plan: ALPRAZolam (XANAX) 0.25 MG tablet prn using sparingly   Nausea and vomiting, intractability of vomiting not specified, unspecified vomiting type - Plan: ondansetron (ZOFRAN-ODT) 4 MG disintegrating tablet  Osteoporosis, unspecified osteoporosis type, unspecified pathological fracture presence On prolia Q 6 months  Chronic systolic CHF (congestive heart failure) (HCC) Cont entresto, torsemide no changes f/u per cards  Stage 3b chronic kidney disease ([place])  Declines renal for now    Provider: Dr. Olivia Mackie McLean-Scocuzza-Internal Medicine

## 2020-06-27 NOTE — Patient Instructions (Addendum)
Consider bone density  Call [facility] [phone] [facility] to [phone]    Osteoporosis  Osteoporosis happens when the bones become thin and less dense than normal. Osteoporosis makes bones more brittle and fragile and more likely to break (fracture). Over time, osteoporosis can cause your bones to become so weak that they fracture after a minor fall. Bones in the hip, wrist, and spine are most likely to fracture due to osteoporosis. What are the causes? The exact cause of this condition is not known. What increases the risk? You are more likely to develop this condition if you:  Have family members with this condition.  Have poor nutrition.  Use the following: ? Steroid medicines, such as prednisone. ? Anti-seizure medicines. ? Nicotine or tobacco, such as cigarettes, e-cigarettes, and chewing tobacco.  Are female.  Are age 36 or older.  Are not physically active (are sedentary).  Are of European or Asian descent.  Have a small body frame. What are the signs or symptoms? A fracture might be the first sign of osteoporosis, especially if the fracture results from a fall or injury that usually would not cause a bone to break. Other signs and symptoms include:  Pain in the neck or low back.  Stooped posture.  Loss of height. How is this diagnosed? This condition may be diagnosed based on:  Your medical history.  A physical exam.  A bone mineral density test, also called a DXA or DEXA test (dual-energy X-ray absorptiometry test). This test uses X-rays to measure the amount of minerals in your bones. How is this treated? This condition may be treated by:  Making lifestyle changes, such as: ? Including foods with more calcium and vitamin D in your diet. ? Doing weight-bearing and muscle-strengthening exercises. ? Stopping tobacco use. ? Limiting alcohol intake.  Taking medicine to slow the process of bone loss or to increase bone  density.  Taking daily supplements of calcium and vitamin D.  Taking hormone replacement medicines, such as estrogen for women and testosterone for men.  Monitoring your levels of calcium and vitamin D. The goal of treatment is to strengthen your bones and lower your risk for a fracture. Follow these instructions at home: Eating and drinking Include calcium and vitamin D in your diet. Calcium is important for bone health, and vitamin D helps your body absorb calcium. Good sources of calcium and vitamin D include:  Certain fatty fish, such as salmon and tuna.  Products that have calcium and vitamin D added to them (are fortified), such as fortified cereals.  Egg yolks.  Cheese.  Liver.   Activity Do exercises as told by your health care provider. Ask your health care provider what exercises and activities are safe for you. You should do:  Exercises that make you work against gravity (weight-bearing exercises), such as tai chi, yoga, or walking.  Exercises to strengthen muscles, such as lifting weights. Lifestyle  Do not drink alcohol if: ? Your health care provider tells you not to drink. ? You are pregnant, may be pregnant, or are planning to become pregnant.  If you drink alcohol: ? Limit how much you use to:  0-1 drink a day for women.  0-2 drinks a day for men.  Know how much alcohol is in your drink. In the [place] one drink equals one 12 oz bottle of beer (355 mL), one 5 oz glass of wine (148 mL), or one 1 oz glass of hard liquor (44  mL).  Do not use any products that contain nicotine or tobacco, such as cigarettes, e-cigarettes, and chewing tobacco. If you need help quitting, ask your health care provider. Preventing falls  Use devices to help you move around (mobility aids) as needed, such as canes, walkers, scooters, or crutches.  Keep rooms well-lit and clutter-free.  Remove tripping hazards from walkways, including cords and throw rugs.  Install grab bars  in bathrooms and safety rails on stairs.  Use rubber mats in the bathroom and other areas that are often wet or slippery.  Wear closed-toe shoes that fit well and support your feet. Wear shoes that have rubber soles or low heels.  Review your medicines with your health care provider. Some medicines can cause dizziness or changes in blood pressure, which can increase your risk of falling. General instructions  Take over-the-counter and prescription medicines only as told by your health care provider.  Keep all follow-up visits. This is important. Contact a health care provider if:  You have never been screened for osteoporosis and you are: ? A woman who is age 2 or older. ? A man who is age 86 or older. Get help right away if:  You fall or injure yourself. Summary  Osteoporosis is thinning and loss of density in your bones. This makes bones more brittle and fragile and more likely to break (fracture),even with minor falls.  The goal of treatment is to strengthen your bones and lower your risk for a fracture.  Include calcium and vitamin D in your diet. Calcium is important for bone health, and vitamin D helps your body absorb calcium.  Talk with your health care provider about screening for osteoporosis if you are a woman who is age 11 or older, or a man who is age 58 or older. This information is not intended to replace advice given to you by your health care provider. Make sure you discuss any questions you have with your health care provider. Document Revised: 08/05/2019 Document Reviewed: 08/05/2019 Elsevier Patient Education  [place].

## 2020-07-11 ENCOUNTER — Encounter: Payer: Self-pay | Admitting: Internal Medicine

## 2020-07-13 ENCOUNTER — Encounter: Payer: Self-pay | Admitting: Internal Medicine

## 2020-07-13 ENCOUNTER — Other Ambulatory Visit: Payer: Self-pay | Admitting: Internal Medicine

## 2020-07-13 DIAGNOSIS — M542 Cervicalgia: Secondary | ICD-10-CM

## 2020-07-24 ENCOUNTER — Encounter: Payer: Self-pay | Admitting: Internal Medicine

## 2020-08-08 ENCOUNTER — Other Ambulatory Visit: Payer: Self-pay | Admitting: Cardiovascular Disease

## 2020-09-19 ENCOUNTER — Ambulatory Visit: Payer: Medicare HMO | Admitting: Internal Medicine

## 2020-09-22 ENCOUNTER — Other Ambulatory Visit: Payer: Self-pay

## 2020-09-22 ENCOUNTER — Ambulatory Visit: Payer: Medicare HMO | Admitting: Nurse Practitioner

## 2020-09-22 ENCOUNTER — Encounter: Payer: Self-pay | Admitting: Nurse Practitioner

## 2020-09-22 VITALS — BP 130/70 | HR 80 | Ht 60.0 in | Wt 142.0 lb

## 2020-09-22 DIAGNOSIS — E785 Hyperlipidemia, unspecified: Secondary | ICD-10-CM

## 2020-09-22 DIAGNOSIS — I2510 Atherosclerotic heart disease of native coronary artery without angina pectoris: Secondary | ICD-10-CM

## 2020-09-22 DIAGNOSIS — I340 Nonrheumatic mitral (valve) insufficiency: Secondary | ICD-10-CM | POA: Diagnosis not present

## 2020-09-22 DIAGNOSIS — I5043 Acute on chronic combined systolic (congestive) and diastolic (congestive) heart failure: Secondary | ICD-10-CM | POA: Diagnosis not present

## 2020-09-22 NOTE — Patient Instructions (Signed)
Medication Instructions:  No changes at this time.  *If you need a refill on your cardiac medications before your next appointment, please call your pharmacy*   Lab Work: BMET & BNP done today.  If you have labs (blood work) drawn today and your tests are completely normal, you will receive your results only by: [place] (if you have MyChart) OR A paper copy in the mail If you have any lab test that is abnormal or we need to change your treatment, we will call you to review the results.   Testing/Procedures: None   Follow-Up: At [facility], you and your health needs are our priority.  As part of our continuing mission to provide you with exceptional heart care, we have created designated Provider Care Teams.  These Care Teams include your primary Cardiologist (physician) and Advanced Practice Providers (APPs -  Physician Assistants and Nurse Practitioners) who all work together to provide you with the care you need, when you need it.   Your next appointment:   1 month(s)  The format for your next appointment:   In Person  Provider:   Ida Rogue, MD or Murray Hodgkins, NP

## 2020-09-22 NOTE — Progress Notes (Signed)
Office Visit    Patient Name: [name] Date of Encounter: 09/22/2020  Primary Care Provider:  McLean-Scocuzza, Nino Glow, MD Primary Cardiologist:  Ida Rogue, MD  Chief Complaint    80 year old female with a history of CAD status post prior drug-eluting stent placement to the LAD and RCA, chronic mild systolic diastolic congestive heart failure, moderate to severe mitral regurgitation, hyperlipidemia, left bundle branch block, hepatitis C, and chronic back pain, who presents for follow-up related to CAD and HFrEF.  Past Medical History    Past Medical History:  Diagnosis Date   Anxiety    Arthritis    CAD (coronary artery disease)    a. cardiac cath 10/2014: ostial LAD 30%, prox LAD 90% s/p PCI/DES, ostial RCA 30%, mid RCA 90% s/p PCI/DES, ostial Ramus 50%   Chronic back pain    Chronic fatigue    Chronic systolic CHF (congestive heart failure) ([place])    a. 10/2014 Echo: EF 20%; b. 06/2017 Echo: EF 20-25%, diff HK, Gr2 DD, Mild AI. Mod to sev MR. Mod dil LA.   Hepatitis C    Ischemic cardiomyopathy    a. 10/2014 Echo: EF 20%; b. 06/2017 Echo: EF 20-25%.   LBBB (left bundle branch block)    Moderate to Severe Mitral regurgitation    a. 06/2017 Echo: Mod-Sev MR.   Osteoporosis    UTI (lower urinary tract infection)    Past Surgical History:  Procedure Laterality Date   BACK SURGERY  07/14   T10, L1   CARDIAC CATHETERIZATION Bilateral 10/27/2014   Procedure: Left Heart Cath and Coronary Angiography;  Surgeon: Minna Merritts, MD;  Location: [facility];  Service: Cardiovascular;  Laterality: Bilateral;   CARDIAC CATHETERIZATION N/A 10/27/2014   Procedure: Coronary Stent Intervention;  Surgeon: Wellington Hampshire, MD;  Location: [facility];  Service: Cardiovascular;  Laterality: N/A;    Allergies  Allergies  Allergen Reactions   Sugar-Protein-Starch Nausea And Vomiting    Only to sugar    History of Present Illness    81 year old female with the  above complex past medical history including coronary artery disease, ischemic cardiomyopathy, HFrEF, moderate to severe mitral regurgitation, hyperlipidemia, left bundle branch block, hepatitis C, and chronic back pain.  She previously underwent diagnostic catheterization in August 2016 revealing severe proximal LAD disease as well as mid RCA disease.  Both areas were treated drug-eluting stents.  Echo in 2016 showed an EF of 25%.  Follow-up echo in April 2019 showed an EF of 20 to 25% with diffuse hypokinesis, grade 2 diastolic dysfunction, and moderate to severe mitral regurgitation.  She has previously declined AICD placement.  Unfortunately, in the setting of chronic back pain with multiple spinal fusions, she is very sedentary.  Ambulation around her home is limited to going from her lift chair to the bathroom or kitchen and back.  She does not experience dyspnea or chest pain with her limited activity.  She is concerned about weight gain over the past 3 years, as her weight has climbed from 109 pounds in 2019 242 pounds today.  Over the past year, her weight is only up 5 pounds.  She is concerned that she might be retaining fluid, particularly in her abdomen.  In that setting, over the past week, she is increased her torsemide to 20 mg twice daily.  She has noticed improved urine output with this, but no change in weight.  Her husband notes that he does all the  cooking and they do eat a rather rich diet.  We discussed how high caloric intake in the absence of activity is likely to result in weight gain.  She denies palpitations, PND, orthopnea, dizziness, syncope, edema, or early satiety.  Home Medications    Current Outpatient Medications  Medication Sig Dispense Refill   ALPRAZolam (XANAX) 0.25 MG tablet Qd to bid prn 60 tablet 2   Calcium-Vitamin A-Vitamin D (LIQUID CALCIUM PO) Take by mouth. 1 tablespoon daily     citalopram (CELEXA) 10 MG tablet Take 1 tablet (10 mg total) by mouth daily. 90  tablet 3   clopidogrel (PLAVIX) 75 MG tablet TAKE 1 TABLET BY MOUTH EVERY DAY 90 tablet 0   denosumab (PROLIA) 60 MG/ML SOSY injection Inject 60 mg into the skin every 6 (six) months. 1 mL 2   ipratropium (ATROVENT) 0.06 % nasal spray Place 2 sprays into both nostrils daily.     metoprolol succinate (TOPROL-XL) 25 MG 24 hr tablet TAKE 1 TABLET BY MOUTH EVERY DAY 90 tablet 3   ondansetron (ZOFRAN-ODT) 4 MG disintegrating tablet TAKE 1 TABLET BY MOUTH TWICE A DAY AS NEEDED FOR NAUSEA AND VOMITING appt further refills no exceptions 60 tablet 11   rosuvastatin (CRESTOR) 40 MG tablet TAKE 1 TABLET (40 MG TOTAL) BY MOUTH DAILY AT 6 PM. 90 tablet 3   sacubitril-valsartan (ENTRESTO) 24-26 MG Take 1 tablet by mouth 2 (two) times daily. 180 tablet 2   torsemide (DEMADEX) 20 MG tablet TAKE 1 TABLET (20 MG TOTAL) BY MOUTH DAILY. EXCEPT ON SUNDAYS. 90 tablet 2   No current facility-administered medications for this visit.     Review of Systems    Has noted increase in abdominal girth.  Activity is very limited in the setting of chronic back pain.  She denies dyspnea, chest pain, palpitations, PND, orthopnea, dizziness, syncope, edema, or early satiety.  All other systems reviewed and are otherwise negative except as noted above.  Physical Exam    VS:  BP 130/70 (BP Location: Left Arm, Patient Position: Sitting, Cuff Size: Normal)   Pulse 80   Ht 5' (1.524 m)   Wt 142 lb (64.4 kg)   SpO2 97%   BMI 27.73 kg/m  , BMI Body mass index is 27.73 kg/m.     GEN: Well nourished, well developed, in no acute distress. HEENT: normal. Neck: Supple, no JVD, carotid bruits, or masses. Cardiac: RRR, distant.  I do not appreciate any murmurs on exam.  No rubs, or gallops. No clubbing, cyanosis, edema.  Radials/DP/PT 2+ and equal bilaterally.  Respiratory:  Respirations regular and unlabored, clear to auscultation bilaterally. GI: Semifirm and nontender.  Not edematous. BS + x 4. MS: no deformity or  atrophy. Skin: warm and dry, no rash. Neuro:  Strength and sensation are intact. Psych: Normal affect.  Accessory Clinical Findings    ECG personally reviewed by me today -regular sinus rhythm, 80, left axis deviation, left bundle branch block - no acute changes.  Lab Results  Component Value Date   WBC 6.4 06/21/2020   HGB 14.1 06/21/2020   HCT 42.2 06/21/2020   MCV 97.2 06/21/2020   PLT 218.0 06/21/2020   Lab Results  Component Value Date   CREATININE 1.32 (H) 06/21/2020   BUN 20 06/21/2020   NA 141 06/21/2020   K 4.8 06/21/2020   CL 103 06/21/2020   CO2 25 06/21/2020   Lab Results  Component Value Date   ALT 18 05/31/2020   AST  27 05/31/2020   ALKPHOS 63 05/31/2020   BILITOT 0.4 05/31/2020   Lab Results  Component Value Date   CHOL 142 05/31/2020   HDL 49 05/31/2020   LDLCALC 56 05/31/2020   LDLDIRECT 52.0 01/05/2019   TRIG 233 (H) 05/31/2020   CHOLHDL 2.9 05/31/2020    Lab Results  Component Value Date   HGBA1C 6.0 06/21/2020    Assessment & Plan    1.  Chronic combined systolic and diastolic congestive heart failure/ischemic cardiomyopathy: Patient with known severe LV dysfunction with an EF of 20 to 25% by most recent echocardiogram in April 2019.  Grade 2 diastolic dysfunction and moderate to severe mod regurgitation also noted at that time.  She is concerned about weight gain over the past 3 years as she is up 33 pounds since 2019.  She is only up 5 pounds since this time last year.  She complains of some increase in abdominal girth and her abdomen is somewhat firm on exam.  She has been taking her torsemide twice daily over the past week.  Her lungs are clear and she does not have any significant JVD or lower extremity edema.  She denies dyspnea.  I will follow-up basic metabolic panel and BNP today.  I suspect most of her weight gain is secondary to high caloric intake and limited activity though she is certainly at risk of volume overload and low output  heart failure.  We agreed that based on labs, we will make some decisions with regards to her medications.  If renal function and electrolytes are stable, I will add spironolactone 25 mg daily.  We talked about the utility of SGLT2 inhibitors and though she is interested, she is concerned about affordability.  She will continue beta-blocker and Entresto with which, she has been compliant.  Though should likely benefit from a biventricular device, she was not previously interested in ICD therapy.  2.  Coronary artery disease: She has not been having any chest pain or dyspnea.  Activity is limited as outlined above.  She remains on Plavix, beta-blocker, and statin therapy.  3.  Hyperlipidemia: She remains on rosuvastatin therapy with an LDL of 56 in March.  4.  Moderate to severe mitral regurgitation: Noted on echo in 2019 in the setting of marked LV dysfunction.  She denies dyspnea.  Pending labs, will consider repeat echocardiography in the near future.  5.  Disposition: Follow-up labs today and will consider adding spironolactone based on results.  Follow-up in clinic within the next month.   Murray Hodgkins, NP 09/22/2020, 12:50 PM

## 2020-09-23 LAB — BASIC METABOLIC PANEL
BUN/Creatinine Ratio: 18 (ref 12–28)
BUN: 27 mg/dL (ref 8–27)
CO2: 25 mmol/L (ref 20–29)
Calcium: 10 mg/dL (ref 8.7–10.3)
Chloride: 96 mmol/L (ref 96–106)
Creatinine, Ser: 1.53 mg/dL — ABNORMAL HIGH (ref 0.57–1.00)
Glucose: 93 mg/dL (ref 65–99)
Potassium: 5.2 mmol/L (ref 3.5–5.2)
Sodium: 140 mmol/L (ref 134–144)
eGFR: 34 mL/min/{1.73_m2} — ABNORMAL LOW (ref >59)

## 2020-09-25 ENCOUNTER — Telehealth: Payer: Self-pay | Admitting: *Deleted

## 2020-09-25 DIAGNOSIS — I5043 Acute on chronic combined systolic (congestive) and diastolic (congestive) heart failure: Secondary | ICD-10-CM

## 2020-09-25 DIAGNOSIS — I2510 Atherosclerotic heart disease of native coronary artery without angina pectoris: Secondary | ICD-10-CM

## 2020-09-25 DIAGNOSIS — I255 Ischemic cardiomyopathy: Secondary | ICD-10-CM

## 2020-09-25 NOTE — Telephone Encounter (Signed)
No answer/voicemail box is full.  

## 2020-09-25 NOTE — Telephone Encounter (Signed)
-----   Message from Theora Gianotti, NP sent at 09/25/2020  7:46 AM EDT ----- BNP was unable to be drawn on Friday, however, kidneys appear dry on higher dose of torsemide.  Potassium is also on high end of normal.  I recommend that she reduce her torsemide back to what she is Rx - '20mg'$  once daily (she had been taking twice daily recently).  She should have a f/u bmet later this week as well as a BNP, since we were not able to get in the office last week.

## 2020-09-28 NOTE — Telephone Encounter (Signed)
Spoke with patient and reviewed results and recommendations. She states that she is not going to have those repeat labs done at this time. She went on to tell me that at her last visit she was stuck and bled all over the floor. Blood was on the floor and left there while supplies and additional assistance was needed. She was then stuck additional times and due to this she does not want to be stuck again so soon. Reviewed importance of getting these repeat labs to monitor her kidney function, electrolytes, and see how she is responding to the lower dose of torsemide. Again she states to let provider know she is not willing to have this done. Provided emotional support and expressed understanding of her frustrations. She also states that she is just not able to walk that much and this is just an inconvenience for her. Offered to have done at any location she would prefer but again did not want to have them done. She did want this documented in her chart with regards to the labs that were done here in our office and her dissatisfaction. Apologies were given and she did request management to be aware as well. Requested that she please call when she is willing to have these labs done. She verbalized understanding of needed labs, did not want to do them now, wanted management updated, and tell provider of her unwillingness to have them done now. She was appreciative for the call with no further needs at this time.

## 2020-10-03 ENCOUNTER — Other Ambulatory Visit: Payer: Self-pay | Admitting: Internal Medicine

## 2020-10-03 DIAGNOSIS — J309 Allergic rhinitis, unspecified: Secondary | ICD-10-CM

## 2020-10-03 DIAGNOSIS — F419 Anxiety disorder, unspecified: Secondary | ICD-10-CM

## 2020-10-03 MED ORDER — ALPRAZOLAM 0.25 MG PO TABS: ORAL_TABLET | ORAL | 5 refills | Status: DC

## 2020-10-03 MED ORDER — IPRATROPIUM BROMIDE 0.06 % NA SOLN: 2.0000 | Freq: Two times a day (BID) | NASAL | 11 refills | Status: DC | PRN

## 2020-11-06 ENCOUNTER — Other Ambulatory Visit: Payer: Self-pay | Admitting: Cardiovascular Disease

## 2020-11-07 ENCOUNTER — Ambulatory Visit: Payer: Medicare HMO | Admitting: Cardiovascular Disease

## 2020-11-25 ENCOUNTER — Other Ambulatory Visit: Payer: Self-pay | Admitting: Cardiovascular Disease

## 2020-11-27 ENCOUNTER — Telehealth: Payer: Self-pay | Admitting: Internal Medicine

## 2020-11-27 NOTE — Telephone Encounter (Signed)
Please reschedule overdue 1 month F/U appointment. Patient cancelled due to being out of town. Thank you!

## 2020-11-27 NOTE — Telephone Encounter (Signed)
Due 12/26/20 authorization sent.

## 2020-12-04 NOTE — Telephone Encounter (Signed)
Patient aware Prolia approved an d in office.

## 2020-12-04 NOTE — Telephone Encounter (Signed)
Patient got a letter Saturday saying that her Prolia is not covered in the first paragraph. Then the second paragraph states that it is covered from 06/08/20 - 06/08/21. So Patient called [facility] and could not really understand the person on the phone. Then they told her to call [facility] the pharmacy.   Patient wanted Juliann Pulse to know what happened. Wanted to make sure that she has the Patient did not mess anything up by calling. States she is due 12/26/20 for her next shot.   For your information

## 2020-12-26 ENCOUNTER — Other Ambulatory Visit: Payer: Self-pay

## 2020-12-26 ENCOUNTER — Ambulatory Visit (INDEPENDENT_AMBULATORY_CARE_PROVIDER_SITE_OTHER): Payer: Medicare HMO

## 2020-12-26 DIAGNOSIS — M810 Age-related osteoporosis without current pathological fracture: Secondary | ICD-10-CM

## 2020-12-26 MED ORDER — DENOSUMAB 60 MG/ML ~~LOC~~ SOSY
60.0000 mg | PREFILLED_SYRINGE | Freq: Once | SUBCUTANEOUS | Status: AC
  Administered 2020-12-26: 60 mg via SUBCUTANEOUS

## 2020-12-26 NOTE — Progress Notes (Signed)
Patient presented for 6-month Prolia injection SQ to left arm. Patient tolerated well. 

## 2021-01-06 ENCOUNTER — Other Ambulatory Visit: Payer: Self-pay | Admitting: Cardiovascular Disease

## 2021-01-08 DIAGNOSIS — L821 Other seborrheic keratosis: Secondary | ICD-10-CM | POA: Diagnosis not present

## 2021-01-08 DIAGNOSIS — L570 Actinic keratosis: Secondary | ICD-10-CM | POA: Diagnosis not present

## 2021-01-08 NOTE — Telephone Encounter (Signed)
Please schedule overdue 1 month F/U appointment. Thank you!

## 2021-01-08 NOTE — Telephone Encounter (Signed)
Attempted to schedule patient will call back another time

## 2021-02-04 ENCOUNTER — Other Ambulatory Visit: Payer: Self-pay | Admitting: Cardiovascular Disease

## 2021-02-05 DIAGNOSIS — L570 Actinic keratosis: Secondary | ICD-10-CM | POA: Diagnosis not present

## 2021-03-01 ENCOUNTER — Ambulatory Visit (INDEPENDENT_AMBULATORY_CARE_PROVIDER_SITE_OTHER): Payer: Medicare HMO

## 2021-03-01 ENCOUNTER — Encounter: Payer: Self-pay | Admitting: Internal Medicine

## 2021-03-01 VITALS — BP 118/62 | Temp 97.3°F | Ht 60.0 in | Wt 142.0 lb

## 2021-03-01 DIAGNOSIS — Z0000 Encounter for general adult medical examination without abnormal findings: Secondary | ICD-10-CM | POA: Diagnosis not present

## 2021-03-01 DIAGNOSIS — R269 Unspecified abnormalities of gait and mobility: Secondary | ICD-10-CM | POA: Insufficient documentation

## 2021-03-01 NOTE — Progress Notes (Signed)
Subjective:   [name] is a 80 y.o. female who presents for Medicare Annual (Subsequent) preventive examination.  Review of Systems    No ROS.  Medicare Wellness Virtual Visit.  Visual/audio telehealth visit, UTA vital signs.   See social history for additional risk factors.   Cardiac Risk Factors include: advanced age (>38men, >34 women);hypertension     Objective:    Today's Vitals   03/01/21 1123  BP: 118/62  Temp: (!) 97.3 F (36.3 C)  Weight: 142 lb (64.4 kg)  Height: 5' (1.524 m)   Body mass index is 27.73 kg/m.  Advanced Directives 03/01/2021 02/29/2020 12/28/2018 11/03/2014 10/25/2014  Does Patient Have a Medical Advance Directive? Yes Yes Yes Yes No  Type of Paramedic of [place];Living will [place];Living will [place];Living will [place];Living will -  Does patient want to make changes to medical advance directive? No - Patient declined No - Patient declined No - Patient declined No - Patient declined -  Copy of [place] in Chart? No - copy requested No - copy requested No - copy requested No - copy requested -  Would patient like information on creating a medical advance directive? - - - - No - patient declined information    Current Medications (verified) Outpatient Encounter Medications as of 03/01/2021  Medication Sig   ALPRAZolam (XANAX) 0.25 MG tablet Qd to bid prn   Calcium-Vitamin A-Vitamin D (LIQUID CALCIUM PO) Take by mouth. 1 tablespoon daily   citalopram (CELEXA) 10 MG tablet Take 1 tablet (10 mg total) by mouth daily.   clopidogrel (PLAVIX) 75 MG tablet TAKE ONE TABLET BY MOUTH ONE TIME DAILY   denosumab (PROLIA) 60 MG/ML SOSY injection Inject 60 mg into the skin every 6 (six) months.   FLUZONE HIGH-DOSE QUADRIVALENT 0.7 ML SUSY    ipratropium (ATROVENT) 0.06 % nasal spray Place 2 sprays into both nostrils 2 (two) times daily as needed for  rhinitis.   metoprolol succinate (TOPROL-XL) 25 MG 24 hr tablet TAKE ONE TABLET BY MOUTH ONE TIME DAILY   ondansetron (ZOFRAN-ODT) 4 MG disintegrating tablet TAKE 1 TABLET BY MOUTH TWICE A DAY AS NEEDED FOR NAUSEA AND VOMITING appt further refills no exceptions   PFIZER COVID-19 VAC BIVALENT injection    rosuvastatin (CRESTOR) 40 MG tablet TAKE 1 TABLET (40 MG TOTAL) BY MOUTH DAILY AT 6 PM.   sacubitril-valsartan (ENTRESTO) 24-26 MG Take 1 tablet by mouth 2 (two) times daily.   torsemide (DEMADEX) 20 MG tablet TAKE ONE TABLET BY MOUTH ONE TIME DAILY, EXCEPT DO NOT TAKE ON SUNDAYS   No facility-administered encounter medications on file as of 03/01/2021.    Allergies (verified) Sugar-protein-starch   History: Past Medical History:  Diagnosis Date   Anxiety    Arthritis    CAD (coronary artery disease)    a. cardiac cath 10/2014: ostial LAD 30%, prox LAD 90% s/p PCI/DES, ostial RCA 30%, mid RCA 90% s/p PCI/DES, ostial Ramus 50%   Chronic back pain    Chronic fatigue    Chronic systolic CHF (congestive heart failure) ([place])    a. 10/2014 Echo: EF 20%; b. 06/2017 Echo: EF 20-25%, diff HK, Gr2 DD, Mild AI. Mod to sev MR. Mod dil LA.   Hepatitis C    Ischemic cardiomyopathy    a. 10/2014 Echo: EF 20%; b. 06/2017 Echo: EF 20-25%.   LBBB (left bundle branch block)    Moderate to  Severe Mitral regurgitation    a. 06/2017 Echo: Mod-Sev MR.   Osteoporosis    UTI (lower urinary tract infection)    Past Surgical History:  Procedure Laterality Date   BACK SURGERY  07/14   T10, L1   CARDIAC CATHETERIZATION Bilateral 10/27/2014   Procedure: Left Heart Cath and Coronary Angiography;  Surgeon: Minna Merritts, MD;  Location: [facility];  Service: Cardiovascular;  Laterality: Bilateral;   CARDIAC CATHETERIZATION N/A 10/27/2014   Procedure: Coronary Stent Intervention;  Surgeon: Wellington Hampshire, MD;  Location: [facility];  Service: Cardiovascular;  Laterality: N/A;   Family  History  Problem Relation Age of Onset   Arthritis Mother    Heart disease Father        passed at 73   Diabetes Father    Social History   Socioeconomic History   Marital status: Married    Spouse name: [name]   Number of children: 0   Years of education: 14   Highest education level: Not on file  Occupational History   Occupation: Receptionist    Comment: Retired in [place] Use   Smoking status: Former    Packs/day: 1.00    Years: 10.00    Pack years: 10.00    Types: Cigarettes   Smokeless tobacco: Never  Vaping Use   Vaping Use: Never used  Substance and Sexual Activity   Alcohol use: No    Alcohol/week: 0.0 standard drinks   Drug use: No   Sexual activity: Yes    Birth control/protection: Post-menopausal  Other Topics Concern   Not on file  Social History Narrative   Pt is 80 yo female, married to husband of 21 years.  Pt has no children.  Pt lives with husband and teacup poodle, [name]. 26 y.o as of 01/01/19.  Pt states she hurt her back last year and has not been able to participate in much physical activity since. Pt enjoys traveling with her husband in motor home, doing crossword puzzles with husband, and playing cards with friends.  Pt was raised in [place] and has lived in [place] entire adult life.  Pt worked in [employer] in [place] until [place] when they closed.     Social Determinants of Health   Financial Resource Strain: Low Risk    Difficulty of Paying Living Expenses: Not hard at all  Food Insecurity: No Food Insecurity   Worried About Charity fundraiser in the Last Year: Never true   [place] in the Last Year: Never true  Transportation Needs: No Transportation Needs   Lack of Transportation (Medical): No   Lack of Transportation (Non-Medical): No  Physical Activity: Insufficiently Active   Days of Exercise per Week: 5 days   Minutes of Exercise per Session: 10 min  Stress: No Stress Concern Present   Feeling of Stress  : Not at all  Social Connections: Unknown   Frequency of Communication with Friends and Family: Not on file   Frequency of Social Gatherings with Friends and Family: Not on file   Attends Religious Services: Not on file   Active Member of Clubs or Organizations: Not on file   Attends Archivist Meetings: Not on file   Marital Status: Married    Tobacco Counseling Counseling given: Not Answered   Clinical Intake:  Pre-visit preparation completed: Yes        Diabetes: No  How often do you need to  have someone help you when you read instructions, pamphlets, or other written materials from your doctor or pharmacy?: 1 - Never  Interpreter Needed?: No      Activities of Daily Living In your present state of health, do you have any difficulty performing the following activities: 03/01/2021  Hearing? N  Vision? N  Difficulty concentrating or making decisions? N  Walking or climbing stairs? Y  Comment Unsteady gait. Cane in use.  Dressing or bathing? N  Doing errands, shopping? Y  Preparing Food and eating ? Y  Comment Husband assist with meal prep. Self feeds.  Using the Toilet? N  In the past six months, have you accidently leaked urine? N  Do you have problems with loss of bowel control? N  Managing your Medications? N  Managing your Finances? Y  Comment husband assist as needed  Housekeeping or managing your Housekeeping? Y  Comment husband assist as needed  Some recent data might be hidden   Patient Care Team: McLean-Scocuzza, Nino Glow, MD as PCP - General (Internal Medicine) Minna Merritts, MD as PCP - Cardiology (Cardiology) Alisa Graff, FNP as Nurse Practitioner (Cardiology) Minna Merritts, MD as Consulting Physician (Cardiology)  Indicate any recent Medical Services you may have received from other than [facility] providers in the past year (date may be approximate).     Assessment:   This is a routine wellness examination for  Sahalie.  Virtual Visit via Telephone Note  I connected with  [name] on 03/01/21 at 11:15 AM EST by telephone and verified that I am speaking with the correct person using two identifiers.  Persons participating in the virtual visit: patient/Nurse Health Advisor   I discussed the limitations, risks, security and privacy concerns of performing an evaluation and management service by telephone and the availability of in person appointments. The patient expressed understanding and agreed to proceed.  Interactive audio and video telecommunications were attempted between this nurse and patient, however failed, due to patient having technical difficulties OR patient did not have access to video capability.  We continued and completed visit with audio only.  Some vital signs may be absent or patient reported.   Hearing/Vision screen Hearing Screening - Comments:: Patient is able to hear conversational tones without difficulty.  No issues reported. Vision Screening - Comments:: Wears corrective lenses They have seen their ophthalmologist in the last 12 months.   Dietary issues and exercise activities discussed: Current Exercise Habits: Home exercise routine, Intensity: Mild Regular diet   Goals Addressed             This Visit's Progress    Increase physical activity       Stay active       Depression Screen PHQ 2/9 Scores 03/01/2021 02/29/2020 12/28/2018 05/14/2017 11/18/2016 08/04/2015 11/03/2014  PHQ - 2 Score 0 0 0 0 0 0 0    Fall Risk Fall Risk  03/01/2021 06/27/2020 02/29/2020 07/08/2019 12/28/2018  Falls in the past year? 0 0 0 0 0  Comment - - - - -  Number falls in past yr: 0 0 0 0 0  Injury with Fall? - 0 0 0 -  Risk for fall due to : - Impaired balance/gait - Impaired balance/gait;Impaired mobility -  Follow up Falls evaluation completed Falls evaluation completed Falls evaluation completed Falls evaluation completed -    FALL RISK PREVENTION PERTAINING TO THE  HOME: Home free of loose throw rugs in walkways, pet beds, electrical cords, etc? Yes  Adequate lighting in your home to reduce risk of falls? Yes   ASSISTIVE DEVICES UTILIZED TO PREVENT FALLS: Life alert? No  Use of a cane, walker or w/c? Yes  Grab bars in the bathroom? Yes  Shower chair or bench in shower? Yes  Elevated toilet seat or a handicapped toilet? Yes   TIMED UP AND GO: Was the test performed? No .   Cognitive Function:  Patient is alert and oriented x3.    6CIT Screen 03/01/2021 02/29/2020 12/28/2018  What Year? 0 points 0 points 0 points  What month? 0 points 0 points 0 points  What time? 0 points 0 points 0 points  Count back from 20 0 points - 0 points  Months in reverse 4 points 0 points 0 points  Repeat phrase 2 points - 0 points  Total Score 6 - 0    Immunizations Immunization History  Administered Date(s) Administered   Fluad Quad(high Dose 65+) 12/12/2020   Influenza Split 11/05/2012, 11/16/2013   Influenza, High Dose Seasonal PF 11/20/2016, 11/12/2017, 11/19/2018   Influenza-Unspecified 11/23/2014, 11/26/2019   PFIZER Comirnaty(Gray Top)Covid-19 Tri-Sucrose Vaccine 06/12/2020   PFIZER(Purple Top)SARS-COV-2 Vaccination 03/30/2019, 04/20/2019, 12/10/2019   Pneumococcal Conjugate-13 02/06/2015   Pneumococcal Polysaccharide-23 03/04/2010, 05/14/2017   TDAP status: Due, Education has been provided regarding the importance of this vaccine. Advised may receive this vaccine at local pharmacy or Health Dept. Aware to provide a copy of the vaccination record if obtained from local pharmacy or Health Dept. Verbalized acceptance and understanding.Deferred.   Shingrix Completed?: No.    Education has been provided regarding the importance of this vaccine. Patient has been advised to call insurance company to determine out of pocket expense if they have not yet received this vaccine. Advised may also receive vaccine at local pharmacy or Health Dept. Verbalized  acceptance and understanding.  Screening Tests Health Maintenance  Topic Date Due   COVID-19 Vaccine (5 - Booster for Pfizer series) 03/17/2021 (Originally 08/07/2020)   Zoster Vaccines- Shingrix (1 of 2) 05/30/2021 (Originally 09/23/1991)   TETANUS/TDAP  03/01/2022 (Originally 09/22/1959)   Pneumonia Vaccine 67+ Years old  Completed   INFLUENZA VACCINE  Completed   DEXA SCAN  Completed   Hepatitis C Screening  Completed   HPV VACCINES  Aged Out   Health Maintenance There are no preventive care reminders to display for this patient.  Lung Cancer Screening: (Low Dose CT Chest recommended if Age 58-80 years, 30 pack-year currently smoking OR have quit w/in 15years.) does not qualify.   Vision Screening: Recommended annual ophthalmology exams for early detection of glaucoma and other disorders of the eye.  Dental Screening: Recommended annual dental exams for proper oral hygiene  Community Resource Referral / Chronic Care Management: CRR required this visit?  No   CCM required this visit?  No      Plan:   Keep all routine maintenance appointments.   I have personally reviewed and noted the following in the patients chart:   Medical and social history Use of alcohol, tobacco or illicit drugs  Current medications and supplements including opioid prescriptions.  Functional ability and status Nutritional status Physical activity Advanced directives List of other physicians Hospitalizations, surgeries, and ER visits in previous 12 months Vitals Screenings to include cognitive, depression, and falls Referrals and appointments  In addition, I have reviewed and discussed with patient certain preventive protocols, quality metrics, and best practice recommendations. A written personalized care plan for preventive services as well as general preventive health recommendations were  provided to patient.     Varney Biles, LPN   07/12/209

## 2021-03-01 NOTE — Patient Instructions (Addendum)
°  [name] , Thank you for taking time to come for your Medicare Wellness Visit. I appreciate your ongoing commitment to your health goals. Please review the following plan we discussed and let me know if I can assist you in the future.   These are the goals we discussed:  Goals      Increase physical activity     Stay active        This is a list of the screening recommended for you and due dates:  Health Maintenance  Topic Date Due   COVID-19 Vaccine (5 - Booster for Pfizer series) 03/17/2021*   Zoster (Shingles) Vaccine (1 of 2) 05/30/2021*   Tetanus Vaccine  03/01/2022*   Pneumonia Vaccine  Completed   Flu Shot  Completed   DEXA scan (bone density measurement)  Completed   Hepatitis C Screening: USPSTF Recommendation to screen - Ages 3-79 yo.  Completed   HPV Vaccine  Aged Out  *Topic was postponed. The date shown is not the original due date.

## 2021-03-07 ENCOUNTER — Telehealth: Payer: Self-pay | Admitting: Internal Medicine

## 2021-03-07 NOTE — Telephone Encounter (Signed)
-----   Message from Delorise Jackson, MD sent at 03/01/2021  5:23 PM EST ----- Regarding: FW: Rx for Rollator Walker Mail rx for rollator please thank you ----- Message ----- From: Dia Crawford, LPN Sent: [id number]  11:56 AM EST To: Nino Glow McLean-Scocuzza, MD Subject: Rx for Rollator Walker                         Patient is scheduled with you in April for annual.  She asks if you can write a Rx or letter that would aid/assist with the purchase of a rollator walker.  I am unsure how this part works but I recall one of the physicians doing something along these lines. Let me know if I need to do anything else.  Thanks,  Denisa

## 2021-03-07 NOTE — Telephone Encounter (Signed)
Mailed to the address on file.   No answer, no voicemail when calling to inform Patient

## 2021-03-09 ENCOUNTER — Encounter: Payer: Self-pay | Admitting: Internal Medicine

## 2021-03-13 DIAGNOSIS — R269 Unspecified abnormalities of gait and mobility: Secondary | ICD-10-CM | POA: Diagnosis not present

## 2021-03-13 NOTE — Telephone Encounter (Signed)
[facility] Fax # [phone]. Phone#[phone]. Needing Clinic notes faxed for the walker.

## 2021-03-13 NOTE — Telephone Encounter (Signed)
Printed and faxed

## 2021-04-06 ENCOUNTER — Other Ambulatory Visit: Payer: Self-pay | Admitting: Internal Medicine

## 2021-04-06 DIAGNOSIS — F419 Anxiety disorder, unspecified: Secondary | ICD-10-CM

## 2021-04-06 NOTE — Telephone Encounter (Signed)
Refilled: 10/03/2020 Last OV: 06/27/2020 Next OV: 06/06/2021

## 2021-05-05 ENCOUNTER — Other Ambulatory Visit: Payer: Self-pay | Admitting: Cardiovascular Disease

## 2021-05-08 ENCOUNTER — Ambulatory Visit: Payer: Medicare HMO | Admitting: Cardiovascular Disease

## 2021-05-08 ENCOUNTER — Other Ambulatory Visit: Payer: Self-pay

## 2021-05-08 ENCOUNTER — Encounter: Payer: Self-pay | Admitting: Cardiovascular Disease

## 2021-05-08 VITALS — BP 140/70 | HR 80 | Ht 60.0 in | Wt 147.1 lb

## 2021-05-08 DIAGNOSIS — I5022 Chronic systolic (congestive) heart failure: Secondary | ICD-10-CM

## 2021-05-08 NOTE — Progress Notes (Signed)
Date:  05/08/2021   ID:  [name], DOB August 16, 1940, MRN [id number]  Patient Location:  [address] [place] [address]   Provider location:   [facility], [place] office  PCP:  McLean-Scocuzza, Nino Glow, MD  Cardiologist:  Patsy Baltimore  Chief Complaint  Patient presents with   Follow-up    Patient c/o shortness of breath with over exertion. Medications reviewed by the patient verbally.     History of Present Illness:    [name] is a 81 y.o. female  past medical history of hepatitis C,  osteoporosis,  arthritis,  chronic back pain s/p multiple fusions,  chronic UTI's,  ischemic cardiomyopathy with ejection fraction 25% on echocardiogram Mitral valve: There was moderate to severe regurgitation  November 2016, coronary artery disease with PCI to the mid RCA,  proximal LAD August 2016,  Significant medication intolerances Previously not interested in defibrillator or repeat echocardiogram EF in 06/2017, 20 to 25% LBBB who presents for routine follow-up of her cardiomyopathy, CAD  Last seen by myself in clinic March 2022 In follow-up reports that her weight has been trending upwards, Husband continues to take care of her secondary to chronic back pain, walking with a cane, walker, wheelchair Reports legs give out, she is very sedentary  Continues to take torsemide daily  Last echo 2019 low EF 20 to 25% Previously declined repeat echo  Prior lab work reviewed CR 1.29, BUN 44  EKG personally reviewed by myself on todays visit Shows normal sinus rhythm rate 80 bpm left bundle branch block dating back to 2015  No past medical history reviewed  echocardiogram April 2019 showed severely depressed LV function, no improvement from 2018   She previously felt  Plavix was  causing her nose to run.  Also Previously she felt Lasix was causing her runny nose. now taking Lasix, not taking torsemide  She was unable to tolerate  brilinta She was having side effects from the entresto, only able to tolerate 1/2 dose of the 24/26 mg pill once a day in the evening. Able to tolerate metoprolol, Lipitor, potassium   She is not interested in defibrillator. We have had previous discussions She did consider applying external defibrillator to put in their house   Other past medical history reviewed [facility] 10/25/14 to 8/26 for new onset acute systolic CHF, ischemic cardiomyopathy, acute respiratory distress with hypoxia, and newly diagnosed CAD s/p PCI/DES to mid RCA and prox LAD.       Admitted to [facility] in August with acute respiraotry destress with hypoxia    Echo August 2016 showed EF of 20%, akinesis of the inferior, inferoseptal, distal anterior, and apical walls. There was hypokinesis elsewhere. Cavity size was mildly dilated. Wall thickness was normal. Mild aortic regurgitation. Mild to moderate mitral regurgitation. Left atrium was mildly dilated. Right ventricle systolic was was moderate to severely reduced. A small pericardial effusion was seen.     cardiac cath on 8/25 that showed ostial LAD 30%, prox LAD 90%, ostial RCA 30%, mid RCA 90%, ostial Ramus 50%.  She underwent successful PCI/DES to the proximal LAD and mid RCA with Xience DES. She was started on aspirin and Brilinta.    she could not tolerated Brilinta 2/2 SOB. She was changed to Plavix.       Past Medical History:  Diagnosis Date   Anxiety    Arthritis    CAD (coronary artery disease)    a. cardiac cath  10/2014: ostial LAD 30%, prox LAD 90% s/p PCI/DES, ostial RCA 30%, mid RCA 90% s/p PCI/DES, ostial Ramus 50%   Chronic back pain    Chronic fatigue    Chronic systolic CHF (congestive heart failure) ([place])    a. 10/2014 Echo: EF 20%; b. 06/2017 Echo: EF 20-25%, diff HK, Gr2 DD, Mild AI. Mod to sev MR. Mod dil LA.   Hepatitis C    Ischemic cardiomyopathy    a. 10/2014 Echo: EF 20%; b. 06/2017 Echo: EF 20-25%.   LBBB (left bundle branch block)    Moderate  to Severe Mitral regurgitation    a. 06/2017 Echo: Mod-Sev MR.   Osteoporosis    UTI (lower urinary tract infection)    Past Surgical History:  Procedure Laterality Date   BACK SURGERY  07/14   T10, L1   CARDIAC CATHETERIZATION Bilateral 10/27/2014   Procedure: Left Heart Cath and Coronary Angiography;  Surgeon: Minna Merritts, MD;  Location: [facility];  Service: Cardiovascular;  Laterality: Bilateral;   CARDIAC CATHETERIZATION N/A 10/27/2014   Procedure: Coronary Stent Intervention;  Surgeon: Wellington Hampshire, MD;  Location: [facility];  Service: Cardiovascular;  Laterality: N/A;     Current Meds  Medication Sig   ALPRAZolam (XANAX) 0.25 MG tablet TAKE ONE TABLET BY MOUTH ONCE TO TWICE DAILY AS NEEDED   Calcium-Vitamin A-Vitamin D (LIQUID CALCIUM PO) Take by mouth. 1 tablespoon daily   citalopram (CELEXA) 10 MG tablet Take 1 tablet (10 mg total) by mouth daily.   clopidogrel (PLAVIX) 75 MG tablet TAKE ONE TABLET BY MOUTH ONE TIME DAILY   denosumab (PROLIA) 60 MG/ML SOSY injection Inject 60 mg into the skin every 6 (six) months.   FLUZONE HIGH-DOSE QUADRIVALENT 0.7 ML SUSY    ipratropium (ATROVENT) 0.06 % nasal spray Place 2 sprays into both nostrils 2 (two) times daily as needed for rhinitis.   metoprolol succinate (TOPROL-XL) 25 MG 24 hr tablet TAKE ONE TABLET BY MOUTH ONE TIME DAILY   ondansetron (ZOFRAN-ODT) 4 MG disintegrating tablet TAKE 1 TABLET BY MOUTH TWICE A DAY AS NEEDED FOR NAUSEA AND VOMITING appt further refills no exceptions   PFIZER COVID-19 VAC BIVALENT injection    rosuvastatin (CRESTOR) 40 MG tablet TAKE ONE TABLET BY MOUTH ONE TIME DAILY AT 6PM   sacubitril-valsartan (ENTRESTO) 24-26 MG Take 1 tablet by mouth 2 (two) times daily.   torsemide (DEMADEX) 20 MG tablet TAKE ONE TABLET BY MOUTH ONE TIME DAILY, EXCEPT DO NOT TAKE ON SUNDAYS     Allergies:   Sugar-protein-starch   Social History   Tobacco Use   Smoking status: Former     Packs/day: 1.00    Years: 10.00    Pack years: 10.00    Types: Cigarettes   Smokeless tobacco: Never  Vaping Use   Vaping Use: Never used  Substance Use Topics   Alcohol use: No    Alcohol/week: 0.0 standard drinks   Drug use: No      Family Hx: The patient's family history includes Arthritis in her mother; Diabetes in her father; Heart disease in her father.  ROS:   Please see the history of present illness.    Review of Systems  Constitutional: Negative.   HENT: Negative.    Respiratory: Negative.    Cardiovascular: Negative.   Gastrointestinal: Negative.   Musculoskeletal: Negative.        Leg weakness  Neurological: Negative.   Psychiatric/Behavioral: Negative.    All other systems  reviewed and are negative.   Labs/Other Tests and Data Reviewed:    Recent Labs: 05/31/2020: ALT 18 06/21/2020: Hemoglobin 14.1; Platelets 218.0; TSH 1.53 09/22/2020: BUN 27; Creatinine, Ser 1.53; Potassium 5.2; Sodium 140   Recent Lipid Panel Lab Results  Component Value Date/Time   CHOL 142 05/31/2020 11:57 AM   TRIG 233 (H) 05/31/2020 11:57 AM   HDL 49 05/31/2020 11:57 AM   CHOLHDL 2.9 05/31/2020 11:57 AM   CHOLHDL 3 01/05/2019 10:52 AM   LDLCALC 56 05/31/2020 11:57 AM   LDLDIRECT 52.0 01/05/2019 10:52 AM    Wt Readings from Last 3 Encounters:  05/08/21 147 lb 2 oz (66.7 kg)  03/01/21 142 lb (64.4 kg)  09/22/20 142 lb (64.4 kg)     Exam:    Vital Signs: Vital signs may also be detailed in the HPI BP 140/70 (BP Location: Left Arm, Patient Position: Sitting, Cuff Size: Normal)    Pulse 80    Ht 5' (1.524 m)    Wt 147 lb 2 oz (66.7 kg)    SpO2 98%    BMI 28.73 kg/m   Constitutional:  oriented to person, place, and time. No distress.  HENT:  Head: Grossly normal Eyes:  no discharge. No scleral icterus.  Neck: No JVD, no carotid bruits  Cardiovascular: Regular rate and rhythm, no murmurs appreciated Pulmonary/Chest: Clear to auscultation bilaterally, no wheezes or  rails Abdominal: Soft.  no distension.  no tenderness.  Musculoskeletal: Normal range of motion Neurological:  normal muscle tone. Coordination normal. No atrophy Skin: Skin warm and dry Psychiatric: normal affect, pleasant  ASSESSMENT & PLAN:    Coronary artery disease of native artery of native heart with stable angina pectoris (HCC) -  Currently with no symptoms of angina. No further workup at this time. Continue current medication regimen.  Ischemic cardiomyopathy -chronic systolic CHF EF 20 to 82% Previously declined repeat echocardiogram, ICD Stressed importance of medication compliance with her torsemide, Entresto, metoprolol succinate Appears euvolemic  Mixed hyperlipidemia -  Cholesterol is at goal on the current lipid regimen. No changes to the medications were made.  Essential hypertension -  No medication changes made, no orthostasis  Anxiety -  Managed by primary care   Total encounter time more than 30 minutes  Greater than 50% was spent in counseling and coordination of care with the patient    Signed, Ida Rogue, MD  05/08/2021 2:55 PM    [place] [address], [place], [place] [address]

## 2021-05-08 NOTE — Patient Instructions (Addendum)
Medication Instructions:  ?Your physician recommends that you continue on your current medications as directed. Please refer to the Current Medication list given to you today.  ? ?Torsemide daily with extra torsemide after lunch for leg swelling ? ?If you need a refill on your cardiac medications before your next appointment, please call your pharmacy.  ? ?Lab work: ?Your provider has ordered labs (CMP, lipid profile) to be drawn today before you leave.  ? ?Testing/Procedures: ?No new testing needed ? ?Follow-Up: ?At [facility], you and your health needs are our priority.  As part of our continuing mission to provide you with exceptional heart care, we have created designated Provider Care Teams.  These Care Teams include your primary Cardiologist (physician) and Advanced Practice Providers (APPs -  Physician Assistants and Nurse Practitioners) who all work together to provide you with the care you need, when you need it. ? ?You will need a follow up appointment in 6 months, APP ok ? ?Providers on your designated Care Team:   ?Murray Hodgkins, NP ?Christell Faith, PA-C ?Cadence Kathlen Mody, PA-C ? ?COVID-19 Vaccine Information can be found at: [email or web address] For questions related to vaccine distribution or appointments, please email [email or web address] .[email or web address] or call [phone].  ? ?

## 2021-05-09 LAB — COMPREHENSIVE METABOLIC PANEL
ALT: 18 IU/L (ref 0–32)
AST: 23 IU/L (ref 0–40)
Albumin/Globulin Ratio: 1.6 (ref 1.2–2.2)
Albumin: 4.6 g/dL (ref 3.7–4.7)
Alkaline Phosphatase: 65 IU/L (ref 44–121)
BUN/Creatinine Ratio: 20 (ref 12–28)
BUN: 25 mg/dL (ref 8–27)
Bilirubin Total: 0.3 mg/dL (ref 0.0–1.2)
CO2: 23 mmol/L (ref 20–29)
Calcium: 10.3 mg/dL (ref 8.7–10.3)
Chloride: 104 mmol/L (ref 96–106)
Creatinine, Ser: 1.25 mg/dL — ABNORMAL HIGH (ref 0.57–1.00)
Globulin, Total: 2.8 g/dL (ref 1.5–4.5)
Glucose: 103 mg/dL — ABNORMAL HIGH (ref 70–99)
Potassium: 4.7 mmol/L (ref 3.5–5.2)
Sodium: 141 mmol/L (ref 134–144)
Total Protein: 7.4 g/dL (ref 6.0–8.5)
eGFR: 44 mL/min/{1.73_m2} — ABNORMAL LOW (ref >59)

## 2021-05-09 LAB — LIPID PANEL
Chol/HDL Ratio: 3.7 ratio (ref 0.0–4.4)
Cholesterol, Total: 176 mg/dL (ref 100–199)
HDL: 48 mg/dL (ref >39)
LDL Chol Calc (NIH): 81 mg/dL (ref 0–99)
Triglycerides: 285 mg/dL — ABNORMAL HIGH (ref 0–149)
VLDL Cholesterol Cal: 47 mg/dL — ABNORMAL HIGH (ref 5–40)

## 2021-06-04 ENCOUNTER — Telehealth: Payer: Self-pay | Admitting: Internal Medicine

## 2021-06-04 NOTE — Telephone Encounter (Signed)
Prolia denied due to patient changed insurance filed new insurance with [employer], [facility] [place] also  on cover my meds.  ? ? ?

## 2021-06-05 ENCOUNTER — Emergency Department: Payer: Medicare HMO

## 2021-06-05 ENCOUNTER — Other Ambulatory Visit: Payer: Self-pay

## 2021-06-05 ENCOUNTER — Encounter: Payer: Self-pay | Admitting: Radiology

## 2021-06-05 ENCOUNTER — Inpatient Hospital Stay
Admission: EM | Admit: 2021-06-05 | Discharge: 2021-06-16 | DRG: 521 | Disposition: A | Discharge status: Skilled Nursing Facility | Payer: Medicare HMO | Attending: Internal Medicine | Admitting: Internal Medicine

## 2021-06-05 DIAGNOSIS — S72002D Fracture of unspecified part of neck of left femur, subsequent encounter for closed fracture with routine healing: Secondary | ICD-10-CM | POA: Diagnosis not present

## 2021-06-05 DIAGNOSIS — N183 Chronic kidney disease, stage 3 unspecified: Secondary | ICD-10-CM | POA: Diagnosis not present

## 2021-06-05 DIAGNOSIS — M25552 Pain in left hip: Principal | ICD-10-CM

## 2021-06-05 DIAGNOSIS — S72042A Displaced fracture of base of neck of left femur, initial encounter for closed fracture: Secondary | ICD-10-CM | POA: Diagnosis not present

## 2021-06-05 DIAGNOSIS — Z01810 Encounter for preprocedural cardiovascular examination: Secondary | ICD-10-CM | POA: Diagnosis not present

## 2021-06-05 DIAGNOSIS — I517 Cardiomegaly: Secondary | ICD-10-CM | POA: Diagnosis not present

## 2021-06-05 DIAGNOSIS — I214 Non-ST elevation (NSTEMI) myocardial infarction: Secondary | ICD-10-CM | POA: Diagnosis not present

## 2021-06-05 DIAGNOSIS — E8809 Other disorders of plasma-protein metabolism, not elsewhere classified: Secondary | ICD-10-CM | POA: Diagnosis not present

## 2021-06-05 DIAGNOSIS — G8929 Other chronic pain: Secondary | ICD-10-CM | POA: Diagnosis present

## 2021-06-05 DIAGNOSIS — Z955 Presence of coronary angioplasty implant and graft: Secondary | ICD-10-CM | POA: Diagnosis not present

## 2021-06-05 DIAGNOSIS — Z87891 Personal history of nicotine dependence: Secondary | ICD-10-CM

## 2021-06-05 DIAGNOSIS — R809 Proteinuria, unspecified: Secondary | ICD-10-CM | POA: Diagnosis present

## 2021-06-05 DIAGNOSIS — R0902 Hypoxemia: Secondary | ICD-10-CM | POA: Diagnosis not present

## 2021-06-05 DIAGNOSIS — G9341 Metabolic encephalopathy: Secondary | ICD-10-CM | POA: Diagnosis not present

## 2021-06-05 DIAGNOSIS — I5023 Acute on chronic systolic (congestive) heart failure: Secondary | ICD-10-CM

## 2021-06-05 DIAGNOSIS — R9431 Abnormal electrocardiogram [ECG] [EKG]: Secondary | ICD-10-CM | POA: Diagnosis not present

## 2021-06-05 DIAGNOSIS — E876 Hypokalemia: Secondary | ICD-10-CM | POA: Diagnosis not present

## 2021-06-05 DIAGNOSIS — W19XXXA Unspecified fall, initial encounter: Secondary | ICD-10-CM

## 2021-06-05 DIAGNOSIS — J9601 Acute respiratory failure with hypoxia: Secondary | ICD-10-CM | POA: Diagnosis not present

## 2021-06-05 DIAGNOSIS — F419 Anxiety disorder, unspecified: Secondary | ICD-10-CM | POA: Diagnosis not present

## 2021-06-05 DIAGNOSIS — M549 Dorsalgia, unspecified: Secondary | ICD-10-CM | POA: Diagnosis present

## 2021-06-05 DIAGNOSIS — I447 Left bundle-branch block, unspecified: Secondary | ICD-10-CM | POA: Diagnosis present

## 2021-06-05 DIAGNOSIS — Z20822 Contact with and (suspected) exposure to covid-19: Secondary | ICD-10-CM | POA: Diagnosis present

## 2021-06-05 DIAGNOSIS — R4182 Altered mental status, unspecified: Secondary | ICD-10-CM | POA: Diagnosis not present

## 2021-06-05 DIAGNOSIS — E782 Mixed hyperlipidemia: Secondary | ICD-10-CM | POA: Diagnosis not present

## 2021-06-05 DIAGNOSIS — I130 Hypertensive heart and chronic kidney disease with heart failure and stage 1 through stage 4 chronic kidney disease, or unspecified chronic kidney disease: Secondary | ICD-10-CM | POA: Diagnosis present

## 2021-06-05 DIAGNOSIS — R109 Unspecified abdominal pain: Secondary | ICD-10-CM | POA: Diagnosis not present

## 2021-06-05 DIAGNOSIS — Z743 Need for continuous supervision: Secondary | ICD-10-CM | POA: Diagnosis not present

## 2021-06-05 DIAGNOSIS — K5900 Constipation, unspecified: Secondary | ICD-10-CM

## 2021-06-05 DIAGNOSIS — I9589 Other hypotension: Secondary | ICD-10-CM | POA: Diagnosis not present

## 2021-06-05 DIAGNOSIS — E861 Hypovolemia: Secondary | ICD-10-CM | POA: Diagnosis not present

## 2021-06-05 DIAGNOSIS — S199XXA Unspecified injury of neck, initial encounter: Secondary | ICD-10-CM | POA: Diagnosis not present

## 2021-06-05 DIAGNOSIS — I2510 Atherosclerotic heart disease of native coronary artery without angina pectoris: Secondary | ICD-10-CM | POA: Diagnosis not present

## 2021-06-05 DIAGNOSIS — J189 Pneumonia, unspecified organism: Secondary | ICD-10-CM | POA: Diagnosis not present

## 2021-06-05 DIAGNOSIS — R110 Nausea: Secondary | ICD-10-CM | POA: Diagnosis not present

## 2021-06-05 DIAGNOSIS — J323 Chronic sphenoidal sinusitis: Secondary | ICD-10-CM | POA: Diagnosis not present

## 2021-06-05 DIAGNOSIS — R7401 Elevation of levels of liver transaminase levels: Secondary | ICD-10-CM | POA: Diagnosis not present

## 2021-06-05 DIAGNOSIS — Z7902 Long term (current) use of antithrombotics/antiplatelets: Secondary | ICD-10-CM

## 2021-06-05 DIAGNOSIS — N179 Acute kidney failure, unspecified: Secondary | ICD-10-CM

## 2021-06-05 DIAGNOSIS — J449 Chronic obstructive pulmonary disease, unspecified: Secondary | ICD-10-CM | POA: Diagnosis not present

## 2021-06-05 DIAGNOSIS — Z7401 Bed confinement status: Secondary | ICD-10-CM | POA: Diagnosis not present

## 2021-06-05 DIAGNOSIS — I255 Ischemic cardiomyopathy: Secondary | ICD-10-CM | POA: Diagnosis present

## 2021-06-05 DIAGNOSIS — M810 Age-related osteoporosis without current pathological fracture: Secondary | ICD-10-CM | POA: Diagnosis present

## 2021-06-05 DIAGNOSIS — R69 Illness, unspecified: Secondary | ICD-10-CM | POA: Diagnosis not present

## 2021-06-05 DIAGNOSIS — Z471 Aftercare following joint replacement surgery: Secondary | ICD-10-CM | POA: Diagnosis not present

## 2021-06-05 DIAGNOSIS — I959 Hypotension, unspecified: Secondary | ICD-10-CM | POA: Diagnosis not present

## 2021-06-05 DIAGNOSIS — E785 Hyperlipidemia, unspecified: Secondary | ICD-10-CM | POA: Diagnosis present

## 2021-06-05 DIAGNOSIS — W19XXXS Unspecified fall, sequela: Secondary | ICD-10-CM | POA: Diagnosis not present

## 2021-06-05 DIAGNOSIS — R5382 Chronic fatigue, unspecified: Secondary | ICD-10-CM | POA: Diagnosis present

## 2021-06-05 DIAGNOSIS — I5043 Acute on chronic combined systolic (congestive) and diastolic (congestive) heart failure: Secondary | ICD-10-CM

## 2021-06-05 DIAGNOSIS — I5022 Chronic systolic (congestive) heart failure: Secondary | ICD-10-CM

## 2021-06-05 DIAGNOSIS — N1832 Chronic kidney disease, stage 3b: Secondary | ICD-10-CM | POA: Diagnosis not present

## 2021-06-05 DIAGNOSIS — I10 Essential (primary) hypertension: Secondary | ICD-10-CM | POA: Diagnosis not present

## 2021-06-05 DIAGNOSIS — W1830XA Fall on same level, unspecified, initial encounter: Secondary | ICD-10-CM | POA: Diagnosis not present

## 2021-06-05 DIAGNOSIS — R0989 Other specified symptoms and signs involving the circulatory and respiratory systems: Secondary | ICD-10-CM | POA: Diagnosis not present

## 2021-06-05 DIAGNOSIS — Z91018 Allergy to other foods: Secondary | ICD-10-CM

## 2021-06-05 DIAGNOSIS — S72002A Fracture of unspecified part of neck of left femur, initial encounter for closed fracture: Secondary | ICD-10-CM | POA: Diagnosis not present

## 2021-06-05 DIAGNOSIS — S0990XA Unspecified injury of head, initial encounter: Secondary | ICD-10-CM | POA: Diagnosis not present

## 2021-06-05 DIAGNOSIS — R7989 Other specified abnormal findings of blood chemistry: Secondary | ICD-10-CM

## 2021-06-05 DIAGNOSIS — J811 Chronic pulmonary edema: Secondary | ICD-10-CM | POA: Diagnosis not present

## 2021-06-05 DIAGNOSIS — I272 Pulmonary hypertension, unspecified: Secondary | ICD-10-CM | POA: Diagnosis present

## 2021-06-05 DIAGNOSIS — Z79899 Other long term (current) drug therapy: Secondary | ICD-10-CM

## 2021-06-05 DIAGNOSIS — S79912A Unspecified injury of left hip, initial encounter: Secondary | ICD-10-CM | POA: Diagnosis not present

## 2021-06-05 DIAGNOSIS — N189 Chronic kidney disease, unspecified: Secondary | ICD-10-CM

## 2021-06-05 DIAGNOSIS — R778 Other specified abnormalities of plasma proteins: Secondary | ICD-10-CM

## 2021-06-05 DIAGNOSIS — Z96642 Presence of left artificial hip joint: Secondary | ICD-10-CM | POA: Diagnosis not present

## 2021-06-05 DIAGNOSIS — Z8249 Family history of ischemic heart disease and other diseases of the circulatory system: Secondary | ICD-10-CM

## 2021-06-05 DIAGNOSIS — M6281 Muscle weakness (generalized): Secondary | ICD-10-CM | POA: Diagnosis not present

## 2021-06-05 DIAGNOSIS — I700 Atherosclerosis of aorta: Secondary | ICD-10-CM | POA: Diagnosis not present

## 2021-06-05 LAB — COMPREHENSIVE METABOLIC PANEL
ALT: 19 U/L (ref 0–44)
AST: 29 U/L (ref 15–41)
Albumin: 4.4 g/dL (ref 3.5–5.0)
Alkaline Phosphatase: 54 U/L (ref 38–126)
Anion gap: 11 (ref 5–15)
BUN: 24 mg/dL — ABNORMAL HIGH (ref 8–23)
CO2: 26 mmol/L (ref 22–32)
Calcium: 9.4 mg/dL (ref 8.9–10.3)
Chloride: 103 mmol/L (ref 98–111)
Creatinine, Ser: 1.47 mg/dL — ABNORMAL HIGH (ref 0.44–1.00)
GFR, Estimated: 36 mL/min — ABNORMAL LOW (ref >60)
Glucose, Bld: 159 mg/dL — ABNORMAL HIGH (ref 70–99)
Potassium: 4.3 mmol/L (ref 3.5–5.1)
Sodium: 140 mmol/L (ref 135–145)
Total Bilirubin: 1.2 mg/dL (ref 0.3–1.2)
Total Protein: 8.2 g/dL — ABNORMAL HIGH (ref 6.5–8.1)

## 2021-06-05 LAB — CBC WITH DIFFERENTIAL/PLATELET
Abs Immature Granulocytes: 0.09 10*3/uL — ABNORMAL HIGH (ref 0.00–0.07)
Basophils Absolute: 0 10*3/uL (ref 0.0–0.1)
Basophils Relative: 0 %
Eosinophils Absolute: 0 10*3/uL (ref 0.0–0.5)
Eosinophils Relative: 0 %
HCT: 41.3 % (ref 36.0–46.0)
Hemoglobin: 13.4 g/dL (ref 12.0–15.0)
Immature Granulocytes: 1 %
Lymphocytes Relative: 4 %
Lymphs Abs: 0.5 10*3/uL — ABNORMAL LOW (ref 0.7–4.0)
MCH: 31.6 pg (ref 26.0–34.0)
MCHC: 32.4 g/dL (ref 30.0–36.0)
MCV: 97.4 fL (ref 80.0–100.0)
Monocytes Absolute: 0.5 10*3/uL (ref 0.1–1.0)
Monocytes Relative: 4 %
Neutro Abs: 11.9 10*3/uL — ABNORMAL HIGH (ref 1.7–7.7)
Neutrophils Relative %: 91 %
Platelets: 177 10*3/uL (ref 150–400)
RBC: 4.24 MIL/uL (ref 3.87–5.11)
RDW: 13.6 % (ref 11.5–15.5)
WBC: 12.9 10*3/uL — ABNORMAL HIGH (ref 4.0–10.5)
nRBC: 0 % (ref 0.0–0.2)

## 2021-06-05 LAB — RESP PANEL BY RT-PCR (FLU A&B, COVID) ARPGX2
Influenza A by PCR: NEGATIVE
Influenza B by PCR: NEGATIVE
SARS Coronavirus 2 by RT PCR: NEGATIVE

## 2021-06-05 LAB — APTT: aPTT: 31 seconds (ref 24–36)

## 2021-06-05 LAB — PROTIME-INR
INR: 1 (ref 0.8–1.2)
Prothrombin Time: 13 seconds (ref 11.4–15.2)

## 2021-06-05 LAB — TROPONIN I (HIGH SENSITIVITY)
Troponin I (High Sensitivity): 22 ng/L — ABNORMAL HIGH (ref <18)
Troponin I (High Sensitivity): 42 ng/L — ABNORMAL HIGH (ref <18)

## 2021-06-05 LAB — CK: Total CK: 68 U/L (ref 38–234)

## 2021-06-05 LAB — TYPE AND SCREEN
ABO/RH(D): A POS
Antibody Screen: NEGATIVE

## 2021-06-05 LAB — BRAIN NATRIURETIC PEPTIDE: B Natriuretic Peptide: 820.3 pg/mL — ABNORMAL HIGH (ref 0.0–100.0)

## 2021-06-05 MED ORDER — TRAZODONE HCL 50 MG PO TABS: 25.0000 mg | ORAL_TABLET | Freq: Every evening | ORAL | Status: DC | PRN

## 2021-06-05 MED ORDER — CITALOPRAM HYDROBROMIDE 20 MG PO TABS
10.0000 mg | ORAL_TABLET | Freq: Every day | ORAL | Status: DC
  Administered 2021-06-08 – 2021-06-12 (×5): 10 mg via ORAL
  Filled 2021-06-05: qty 1
  Filled 2021-06-05 (×2): qty 0.5
  Filled 2021-06-05 (×5): qty 1

## 2021-06-05 MED ORDER — FENTANYL CITRATE PF 50 MCG/ML IJ SOSY
50.0000 ug | PREFILLED_SYRINGE | Freq: Once | INTRAMUSCULAR | Status: AC
  Administered 2021-06-05: 50 ug via INTRAVENOUS
  Filled 2021-06-05: qty 1

## 2021-06-05 MED ORDER — IPRATROPIUM BROMIDE 0.06 % NA SOLN: 2.0000 | Freq: Two times a day (BID) | NASAL | Status: DC | PRN

## 2021-06-05 MED ORDER — ALPRAZOLAM 0.25 MG PO TABS
0.2500 mg | ORAL_TABLET | Freq: Two times a day (BID) | ORAL | Status: DC | PRN
  Administered 2021-06-06 – 2021-06-14 (×7): 0.25 mg via ORAL
  Filled 2021-06-05 (×7): qty 1

## 2021-06-05 MED ORDER — CLOPIDOGREL BISULFATE 75 MG PO TABS
75.0000 mg | ORAL_TABLET | Freq: Every day | ORAL | Status: DC
  Administered 2021-06-08 – 2021-06-15 (×7): 75 mg via ORAL
  Filled 2021-06-05 (×8): qty 1

## 2021-06-05 MED ORDER — SACUBITRIL-VALSARTAN 24-26 MG PO TABS
1.0000 | ORAL_TABLET | Freq: Two times a day (BID) | ORAL | Status: DC
  Administered 2021-06-06 – 2021-06-11 (×5): 1 via ORAL
  Filled 2021-06-05 (×10): qty 1

## 2021-06-05 MED ORDER — TORSEMIDE 20 MG PO TABS: 20.0000 mg | ORAL_TABLET | ORAL | Status: DC

## 2021-06-05 MED ORDER — ONDANSETRON 4 MG PO TBDP
4.0000 mg | ORAL_TABLET | Freq: Three times a day (TID) | ORAL | Status: DC | PRN
  Filled 2021-06-05: qty 1

## 2021-06-05 MED ORDER — DOCUSATE SODIUM 100 MG PO CAPS
100.0000 mg | ORAL_CAPSULE | Freq: Two times a day (BID) | ORAL | Status: DC
  Administered 2021-06-06: 100 mg via ORAL
  Filled 2021-06-05 (×3): qty 1

## 2021-06-05 MED ORDER — MORPHINE SULFATE (PF) 2 MG/ML IV SOLN
0.5000 mg | INTRAVENOUS | Status: DC | PRN
  Administered 2021-06-06 – 2021-06-07 (×5): 0.5 mg via INTRAVENOUS
  Filled 2021-06-05 (×6): qty 1

## 2021-06-05 MED ORDER — MAGNESIUM HYDROXIDE 400 MG/5ML PO SUSP
30.0000 mL | Freq: Every day | ORAL | Status: DC | PRN
  Administered 2021-06-07 – 2021-06-14 (×3): 30 mL via ORAL
  Filled 2021-06-05 (×3): qty 30

## 2021-06-05 MED ORDER — ROSUVASTATIN CALCIUM 10 MG PO TABS
40.0000 mg | ORAL_TABLET | Freq: Every day | ORAL | Status: DC
  Administered 2021-06-08: 40 mg via ORAL
  Filled 2021-06-05: qty 4
  Filled 2021-06-05 (×4): qty 2

## 2021-06-05 MED ORDER — FENTANYL CITRATE PF 50 MCG/ML IJ SOSY
50.0000 ug | PREFILLED_SYRINGE | Freq: Once | INTRAMUSCULAR | Status: DC
Start: 2021-06-05 — End: 2021-06-07

## 2021-06-05 MED ORDER — HYDROCODONE-ACETAMINOPHEN 5-325 MG PO TABS
1.0000 | ORAL_TABLET | Freq: Four times a day (QID) | ORAL | Status: DC | PRN
  Administered 2021-06-05 – 2021-06-06 (×2): 1 via ORAL
  Administered 2021-06-06: 2 via ORAL
  Filled 2021-06-05: qty 1
  Filled 2021-06-05: qty 2
  Filled 2021-06-05 (×2): qty 1

## 2021-06-05 MED ORDER — ONDANSETRON HCL 4 MG/2ML IJ SOLN
4.0000 mg | INTRAMUSCULAR | Status: DC | PRN
  Administered 2021-06-06 (×2): 4 mg via INTRAVENOUS
  Filled 2021-06-05 (×2): qty 2

## 2021-06-05 MED ORDER — METOPROLOL SUCCINATE ER 25 MG PO TB24
25.0000 mg | ORAL_TABLET | Freq: Every day | ORAL | Status: DC
  Administered 2021-06-05 – 2021-06-12 (×6): 25 mg via ORAL
  Filled 2021-06-05 (×7): qty 1

## 2021-06-05 MED ORDER — ACETAMINOPHEN 325 MG PO TABS: 650.0000 mg | ORAL_TABLET | ORAL | Status: DC | PRN

## 2021-06-05 MED ORDER — ACETAMINOPHEN 325 MG RE SUPP
650.0000 mg | RECTAL | Status: DC | PRN
  Filled 2021-06-05: qty 2

## 2021-06-05 MED ORDER — FUROSEMIDE 10 MG/ML IJ SOLN
40.0000 mg | Freq: Every day | INTRAMUSCULAR | Status: DC
Start: 2021-06-05 — End: 2021-06-08
  Administered 2021-06-06: 40 mg via INTRAVENOUS
  Filled 2021-06-05 (×3): qty 4

## 2021-06-05 MED ORDER — CEFAZOLIN SODIUM-DEXTROSE 2-4 GM/100ML-% IV SOLN
2.0000 g | Freq: Once | INTRAVENOUS | Status: AC
  Administered 2021-06-05: 2 g via INTRAVENOUS
  Filled 2021-06-05: qty 100

## 2021-06-05 MED ORDER — ONDANSETRON HCL 4 MG/2ML IJ SOLN
4.0000 mg | Freq: Once | INTRAMUSCULAR | Status: AC
  Administered 2021-06-05: 4 mg via INTRAVENOUS
  Filled 2021-06-05: qty 2

## 2021-06-05 MED ORDER — IOHEXOL 350 MG/ML SOLN
60.0000 mL | Freq: Once | INTRAVENOUS | Status: AC | PRN
  Administered 2021-06-05: 60 mL via INTRAVENOUS

## 2021-06-05 MED ORDER — SODIUM CHLORIDE 0.9 % IV SOLN: INTRAVENOUS | Status: DC

## 2021-06-05 NOTE — ED Notes (Signed)
Pt taken to CT at this time.

## 2021-06-05 NOTE — ED Triage Notes (Signed)
Pt arrives to ED via EMS from home. EMS sts that pt fell this afternoon and landed on her left hip. Pt sts that she remembers falling and did not him her head or have any LOC. Pt has been placed on 3l/min via [place]. Pt o2 sat on room air is in the low 80's high 70's. Pt o2 sat is stable at this at 95%. ?

## 2021-06-05 NOTE — ED Provider Notes (Signed)
? ?[facility] ?Provider Note ? ? ? Event Date/Time  ? First MD Initiated Contact with Patient 06/05/21 1640   ?  (approximate) ? ? ?History  ? ?Hip Pain ? ? ?HPI ? ?[name] is a 81 y.o. female with history of coronary disease, CHF on Plavix who comes in with concerns for a fall.  Patient reports that she supposed to use her walker and she was walking without it try to look out the window when she lost her balance and fell directly onto her left hip.  She does not think she hit her head did not lose consciousness denies any chest pain, shortness of breath.  Denies any chest pain, abdominal pain or other concerns.  Reports that she cannot lift up her left leg secondary to pain.  She reports the accident happened earlier this morning but she was refusing to come to the ER ? ? ?Physical Exam  ? ?Triage Vital Signs: ?ED Triage Vitals  ?Enc Vitals Group  ?   BP   ?   Pulse   ?   Resp   ?   Temp   ?   Temp src   ?   SpO2   ?   Weight   ?   Height   ?   Head Circumference   ?   Peak Flow   ?   Pain Score   ?   Pain Loc   ?   Pain Edu?   ?   Excl. in [place]?   ? ? ?Most recent vital signs: ?Vitals:  ? 06/05/21 1730 06/05/21 1800  ?BP: 134/78 (!) 122/53  ?Pulse: 100 96  ?Resp: 20 18  ?Temp:    ?SpO2: 96% 95%  ? ? ? ?General: Awake, no distress.  ?CV:  Good peripheral perfusion.  ?Resp:  Normal effort.  ?Abd:  No distention.  ?Other:  Patient has pain in her left hip with inability to lift the leg up but does have warm foot.  Doppler confirmed pulse ? ? ?ED Results / Procedures / Treatments  ? ?Labs ?(all labs ordered are listed, but only abnormal results are displayed) ?Labs Reviewed  ?CBC WITH DIFFERENTIAL/PLATELET  ?COMPREHENSIVE METABOLIC PANEL  ?PROTIME-INR  ?APTT  ?TYPE AND SCREEN  ? ? ? ?EKG ? ?My interpretation of EKG: ? ?Patient has sinus tachycardia rate of 101 with left bundle branch block with a lot of ST elevation and T wave inversions in V5 and V6 S elevation in aVR ? ?I was initially  very concerned about patient's EKG but upon reviewing her prior EKGs they are actually pretty similar. ? ?RADIOLOGY ?I have reviewed the xray personally she is acute comminuted displaced fracture of the left femoral head ? ? ? ?PROCEDURES: ? ?Critical Care performed: Yes, see critical care procedure note(s) ? ?.1-3 Lead EKG Interpretation ?Performed by: Vanessa Thornport, MD ?Authorized by: Vanessa Hartley, MD  ? ?  Interpretation: normal   ?  ECG rate:  90 ?  ECG rate assessment: normal   ?  Rhythm: sinus rhythm   ?  Ectopy: none   ?  Conduction: normal   ?.Critical Care ?Performed by: Vanessa Merton, MD ?Authorized by: Vanessa Beggs, MD  ? ?Critical care provider statement:  ?  Critical care time (minutes):  30 ?  Critical care was necessary to treat or prevent imminent or life-threatening deterioration of the following conditions:  Respiratory failure ?  Critical care was time spent  personally by me on the following activities:  Development of treatment plan with patient or surrogate, discussions with consultants, evaluation of patient's response to treatment, examination of patient, ordering and review of laboratory studies, ordering and review of radiographic studies, ordering and performing treatments and interventions, pulse oximetry, re-evaluation of patient's condition and review of old charts ? ? ?MEDICATIONS ORDERED IN ED: ?Medications - No data to display ? ? ?IMPRESSION / MDM / ASSESSMENT AND PLAN / ED COURSE  ?I reviewed the triage vital signs and the nursing notes. ?             ?               ? ?Patient comes in with a fall with acute left-sided hip pain.  Patient was hypoxic with EMS and placed on 3 L I tried to take her off but she went down into the 80s.  She denies any history of lung issues.  Not sure if she could just be splinting from the pain.  Her EKG was initially very concerning but I reviewed her prior EKGs and they actually look pretty similar and she denies any chest pain or shortness of  breath.  I added on some troponins just to ensure no evidence of active heart attack.  X-ray does confirm hip fracture.  I did add on some CT scans to make sure no evidence of intracranial hemorrhage, cervical fracture CT PE to evaluate for pulmonary embolism. ? ?CBC slightly elevated white count but no other infectious symptoms.  CMP shows stable least slightly elevated creatinine.  Coags normal.  CK negative.  Troponin slightly elevated at 22 pending repeat   ? ? ?Discussed the case with orthopedic Dr. Posey Pronto who will evaluate patient tomorrow. ? ?Discussed the case with Dr. Sidney Ace from orthopedics who will admit patient ? ? ?FINAL CLINICAL IMPRESSION(S) / ED DIAGNOSES  ? ?Final diagnoses:  ?Left hip pain  ?Fall, initial encounter  ?Acute respiratory failure with hypoxia ([place])  ? ? ? ?Rx / DC Orders  ? ?ED Discharge Orders   ? ? None  ? ?  ? ? ? ?Note:  This document was prepared using Dragon voice recognition software and may include unintentional dictation errors. ?  ?Vanessa Montgomery, MD ?06/05/21 1947 ? ?

## 2021-06-05 NOTE — Progress Notes (Signed)
Full consult note and discussion with patient to follow tomorrow ? ?Called by ED staff. Imaging reviewed.  ?- Plan for surgery for hip hemiarthroplasty tomorrow, likely afternoon.  ?- NPO after midnight ?- Hold anticoagulation ?- Admit to Hospitalist team. May benefit from preop Cardiology evaluation in the AM given medical history and current clinical picture and labwork.  ? ?

## 2021-06-05 NOTE — H&P (Addendum)
?  ?  ?[facility] ? ? ?PATIENT NAME: [name]   ? ?MR#:  [id number] ? ?DATE OF BIRTH:  [birth year] ? ?DATE OF ADMISSION:  06/05/2021 ? ?PRIMARY CARE PHYSICIAN: McLean-Scocuzza, Nino Glow, MD  ? ?Patient is coming from: Home ? ?REQUESTING/REFERRING PHYSICIAN: Loraine Leriche, MD ? ?CHIEF COMPLAINT:  ? ?Chief Complaint  ?Patient presents with  ?? Hip Pain  ? ? ?HISTORY OF PRESENT ILLNESS:  ?[name] is a 81 y.o. Caucasian female with medical history significant for coronary artery disease, anxiety, osteoarthritis, ischemic cardiomyopathy, left bundle branch block, moderate-severe mitral regurgitation and UTI, who presented to the ER with acute onset of accidental mechanical fall.  The patient usually uses a walker.  She got up without her walker and went to the window when she lost her balance and fell on her left side with subsequent left hip pain and inability to move.  She denies any presyncope or syncope.  No chest pain or palpitations.  No paresthesias or focal muscle weakness.  She had nausea and vomiting in the ER.  No dysuria, oliguria or hematuria or flank pain. ? ?ED Course: When she came to the ER heart rate was 101, pulse oximetry 95% on 3 L of O2 by nasal cannula with otherwise normal vital signs.  Labs revealed a BUN of 24 and creatinine 1.47 up from 25/1.25 on 05/08/2021.  BNP was 820.3 and high-sensitivity troponin I was initially 22 then 42 then 926.  CBC showed leukocytosis 12.9 with neutrophilia. ?EKG as reviewed by me : EKG showed sinus tachycardia with a rate of 101 with left bundle branch block that is old.   ?Imaging: Portable chest x-ray showed the following: ?1. Age-indeterminate fracture of the RIGHT posterolateral fourth rib ?with mild displacement, not clearly acute. Correlate with any point ?tenderness in this area. ?2. Deformity of RIGHT chest wall is suggested with conchae cavity of ?RIGHT inferolateral chest wall. This may relate to prior trauma as ?not associated with visible displaced rib  fracture. Correlate with ?point tenderness in this area to determine whether this may ?represent an acute process. ?3. No acute cardiopulmonary abnormality. ?Left hip x-ray showed acute comminuted and displaced, impacted left femoral neck fracture with no hip dislocation. ? ?The patient was given 50 mcg of IV fentanyl, IV cefazolin 4 mg of IV Zofran twice.  After significant increase in troponin I, we added IV heparin and high-dose statin therapy.  Dr. Posey Pronto was notified about the patient and is aware.  The patient will be admitted to a telemetry bed for further evaluation and management. ?PAST MEDICAL HISTORY:  ? ?Past Medical History:  ?Diagnosis Date  ?? Anxiety   ?? Arthritis   ?? CAD (coronary artery disease)   ? a. cardiac cath 10/2014: ostial LAD 30%, prox LAD 90% s/p PCI/DES, ostial RCA 30%, mid RCA 90% s/p PCI/DES, ostial Ramus 50%  ?? Chronic back pain   ?? Chronic fatigue   ?? Chronic systolic CHF (congestive heart failure) ([place])   ? a. 10/2014 Echo: EF 20%; b. 06/2017 Echo: EF 20-25%, diff HK, Gr2 DD, Mild AI. Mod to sev MR. Mod dil LA.  ?? Hepatitis C   ?? Ischemic cardiomyopathy   ? a. 10/2014 Echo: EF 20%; b. 06/2017 Echo: EF 20-25%.  ?? LBBB (left bundle branch block)   ?? Moderate to Severe Mitral regurgitation   ? a. 06/2017 Echo: Mod-Sev MR.  ?? Osteoporosis   ?? UTI (lower urinary tract infection)   ? ? ?PAST  SURGICAL HISTORY:  ? ?Past Surgical History:  ?Procedure Laterality Date  ?? BACK SURGERY  07/14  ? T10, L1  ?? CARDIAC CATHETERIZATION Bilateral 10/27/2014  ? Procedure: Left Heart Cath and Coronary Angiography;  Surgeon: Minna Merritts, MD;  Location: [facility];  Service: Cardiovascular;  Laterality: Bilateral;  ?? CARDIAC CATHETERIZATION N/A 10/27/2014  ? Procedure: Coronary Stent Intervention;  Surgeon: Wellington Hampshire, MD;  Location: [facility];  Service: Cardiovascular;  Laterality: N/A;  ? ? ?SOCIAL HISTORY:  ? ?Social History  ? ?Tobacco Use  ?? Smoking status: Former   ?  Packs/day: 1.00  ?  Years: 10.00  ?  Pack years: 10.00  ?  Types: Cigarettes  ?? Smokeless tobacco: Never  ?Substance Use Topics  ?? Alcohol use: No  ?  Alcohol/week: 0.0 standard drinks  ? ? ?FAMILY HISTORY:  ? ?Family History  ?Problem Relation Age of Onset  ?? Arthritis Mother   ?? Heart disease Father   ?     passed at 52  ?? Diabetes Father   ? ? ?DRUG ALLERGIES:  ? ?Allergies  ?Allergen Reactions  ?? Sugar-Protein-Starch Nausea And Vomiting  ?  Only to sugar  ? ? ?REVIEW OF SYSTEMS:  ? ?ROS ?As per history of present illness. All pertinent systems were reviewed above. Constitutional, HEENT, cardiovascular, respiratory, GI, GU, musculoskeletal, neuro, psychiatric, endocrine, integumentary and hematologic systems were reviewed and are otherwise negative/unremarkable except for positive findings mentioned above in the HPI. ? ? ?MEDICATIONS AT HOME:  ? ?Prior to Admission medications   ?Medication Sig Start Date End Date Taking? Authorizing Provider  ?ALPRAZolam (XANAX) 0.25 MG tablet TAKE ONE TABLET BY MOUTH ONCE TO TWICE DAILY AS NEEDED 04/07/21   McLean-Scocuzza, Nino Glow, MD  ?Calcium-Vitamin A-Vitamin D (LIQUID CALCIUM PO) Take by mouth. 1 tablespoon daily    [provider]  ?citalopram (CELEXA) 10 MG tablet Take 1 tablet (10 mg total) by mouth daily. 06/27/20   McLean-Scocuzza, Nino Glow, MD  ?clopidogrel (PLAVIX) 75 MG tablet TAKE ONE TABLET BY MOUTH ONE TIME DAILY 05/07/21   Minna Merritts, MD  ?denosumab (PROLIA) 60 MG/ML SOSY injection Inject 60 mg into the skin every 6 (six) months. 11/06/17   McLean-Scocuzza, Nino Glow, MD  ?Christianne Dolin HIGH-DOSE QUADRIVALENT 0.7 ML SUSY  12/12/20   [provider]  ?ipratropium (ATROVENT) 0.06 % nasal spray Place 2 sprays into both nostrils 2 (two) times daily as needed for rhinitis. 10/03/20   McLean-Scocuzza, Nino Glow, MD  ?metoprolol succinate (TOPROL-XL) 25 MG 24 hr tablet TAKE ONE TABLET BY MOUTH ONE TIME DAILY 05/07/21   Minna Merritts, MD  ?ondansetron  (ZOFRAN-ODT) 4 MG disintegrating tablet TAKE 1 TABLET BY MOUTH TWICE A DAY AS NEEDED FOR NAUSEA AND VOMITING appt further refills no exceptions 06/27/20   McLean-Scocuzza, Nino Glow, MD  ?PFIZER COVID-19 VAC BIVALENT injection  12/12/20   [provider]  ?rosuvastatin (CRESTOR) 40 MG tablet TAKE ONE TABLET BY MOUTH ONE TIME DAILY AT 6PM 05/07/21   Minna Merritts, MD  ?sacubitril-valsartan (ENTRESTO) 24-26 MG Take 1 tablet by mouth 2 (two) times daily. 11/10/19   Minna Merritts, MD  ?torsemide (DEMADEX) 20 MG tablet TAKE ONE TABLET BY MOUTH ONE TIME DAILY, EXCEPT DO NOT TAKE ON SUNDAYS 12/01/20   Minna Merritts, MD  ? ?  ? ?VITAL SIGNS:  ?Blood pressure 140/61, pulse (!) 106, temperature 98.1 ?F (36.7 ?C), resp. rate 20, weight 68 kg, SpO2  93 %. ? ?PHYSICAL EXAMINATION:  ?Physical Exam ? ?GENERAL:  81 y.o.-year-old Caucasian female patient lying in the bed with no acute distress.  ?EYES: Pupils equal, round, reactive to light and accommodation. No scleral icterus. Extraocular muscles intact.  ?HEENT: Head atraumatic, normocephalic. Oropharynx and nasopharynx clear.  ?NECK:  Supple, no jugular venous distention. No thyroid enlargement, no tenderness.  ?LUNGS: Normal breath sounds bilaterally, no wheezing, rales,rhonchi or crepitation. No use of accessory muscles of respiration.  ?CARDIOVASCULAR: Regular rate and rhythm, S1, S2 normal. No murmurs, rubs, or gallops.  ?ABDOMEN: Soft, nondistended, nontender. Bowel sounds present. No organomegaly or mass.  ?EXTREMITIES: No pedal edema, cyanosis, or clubbing. ?Musculoskeletal: Left lateral hip tenderness. ?NEUROLOGIC: Cranial nerves II through XII are intact. Muscle strength 5/5 in all extremities. Sensation intact. Gait not checked.  ?PSYCHIATRIC: The patient is alert and oriented x 3.  Normal affect and good eye contact. ?SKIN: No obvious rash, lesion, or ulcer.  ? ?LABORATORY PANEL:  ? ?CBC ?Recent Labs  ?Lab 06/06/21 ?0350  ?WBC 10.3  ?HGB 12.0  ?HCT 38.0   ?PLT 180  ? ?------------------------------------------------------------------------------------------------------------------ ? ?Chemistries  ?Recent Labs  ?Lab 06/05/21 ?1706 06/06/21 ?0350  ?NA 1

## 2021-06-06 ENCOUNTER — Encounter: Payer: Self-pay | Admitting: *Deleted

## 2021-06-06 ENCOUNTER — Ambulatory Visit: Payer: Medicare HMO | Admitting: Internal Medicine

## 2021-06-06 ENCOUNTER — Inpatient Hospital Stay (HOSPITAL_COMMUNITY)
Admit: 2021-06-06 | Discharge: 2021-06-06 | Disposition: A | Discharge status: Home / Self Care | Payer: Medicare HMO | Attending: Cardiology | Admitting: Cardiology

## 2021-06-06 DIAGNOSIS — I10 Essential (primary) hypertension: Secondary | ICD-10-CM | POA: Diagnosis not present

## 2021-06-06 DIAGNOSIS — W19XXXA Unspecified fall, initial encounter: Secondary | ICD-10-CM | POA: Diagnosis not present

## 2021-06-06 DIAGNOSIS — I214 Non-ST elevation (NSTEMI) myocardial infarction: Secondary | ICD-10-CM | POA: Diagnosis not present

## 2021-06-06 DIAGNOSIS — R7989 Other specified abnormal findings of blood chemistry: Secondary | ICD-10-CM

## 2021-06-06 DIAGNOSIS — I5022 Chronic systolic (congestive) heart failure: Secondary | ICD-10-CM | POA: Diagnosis not present

## 2021-06-06 DIAGNOSIS — Z01810 Encounter for preprocedural cardiovascular examination: Secondary | ICD-10-CM

## 2021-06-06 DIAGNOSIS — I255 Ischemic cardiomyopathy: Secondary | ICD-10-CM

## 2021-06-06 DIAGNOSIS — S72002A Fracture of unspecified part of neck of left femur, initial encounter for closed fracture: Secondary | ICD-10-CM | POA: Diagnosis not present

## 2021-06-06 DIAGNOSIS — R778 Other specified abnormalities of plasma proteins: Secondary | ICD-10-CM

## 2021-06-06 LAB — BASIC METABOLIC PANEL
Anion gap: 9 (ref 5–15)
BUN: 21 mg/dL (ref 8–23)
CO2: 26 mmol/L (ref 22–32)
Calcium: 8.7 mg/dL — ABNORMAL LOW (ref 8.9–10.3)
Chloride: 104 mmol/L (ref 98–111)
Creatinine, Ser: 1.33 mg/dL — ABNORMAL HIGH (ref 0.44–1.00)
GFR, Estimated: 41 mL/min — ABNORMAL LOW (ref >60)
Glucose, Bld: 127 mg/dL — ABNORMAL HIGH (ref 70–99)
Potassium: 4.4 mmol/L (ref 3.5–5.1)
Sodium: 139 mmol/L (ref 135–145)

## 2021-06-06 LAB — BRAIN NATRIURETIC PEPTIDE: B Natriuretic Peptide: 1590 pg/mL — ABNORMAL HIGH (ref 0.0–100.0)

## 2021-06-06 LAB — CBC
HCT: 38 % (ref 36.0–46.0)
Hemoglobin: 12 g/dL (ref 12.0–15.0)
MCH: 31.1 pg (ref 26.0–34.0)
MCHC: 31.6 g/dL (ref 30.0–36.0)
MCV: 98.4 fL (ref 80.0–100.0)
Platelets: 180 10*3/uL (ref 150–400)
RBC: 3.86 MIL/uL — ABNORMAL LOW (ref 3.87–5.11)
RDW: 13.6 % (ref 11.5–15.5)
WBC: 10.3 10*3/uL (ref 4.0–10.5)
nRBC: 0 % (ref 0.0–0.2)

## 2021-06-06 LAB — TROPONIN I (HIGH SENSITIVITY)
Troponin I (High Sensitivity): 926 ng/L (ref <18)
Troponin I (High Sensitivity): 1777 ng/L (ref <18)
Troponin I (High Sensitivity): 1935 ng/L (ref <18)
Troponin I (High Sensitivity): 1888 ng/L (ref <18)

## 2021-06-06 LAB — ECHOCARDIOGRAM COMPLETE
AR max vel: 1.6 cm2
AV Area VTI: 1.46 cm2
AV Area mean vel: 1.32 cm2
AV Mean grad: 4.3 mmHg
AV Peak grad: 7.2 mmHg
Ao pk vel: 1.34 m/s
Area-P 1/2: 5.62 cm2
Calc EF: 18 %
MV M vel: 5.43 m/s
MV Peak grad: 117.9 mmHg
P 1/2 time: 392 msec
S' Lateral: 5.2 cm
Single Plane A2C EF: 8.2 %
Single Plane A4C EF: 34.1 %
Weight: 2400 oz

## 2021-06-06 LAB — HEPARIN LEVEL (UNFRACTIONATED)
Heparin Unfractionated: 0.59 IU/mL (ref 0.30–0.70)
Heparin Unfractionated: 0.36 IU/mL (ref 0.30–0.70)

## 2021-06-06 LAB — GLUCOSE, CAPILLARY
Glucose-Capillary: 123 mg/dL — ABNORMAL HIGH (ref 70–99)
Glucose-Capillary: 116 mg/dL — ABNORMAL HIGH (ref 70–99)
Glucose-Capillary: 104 mg/dL — ABNORMAL HIGH (ref 70–99)

## 2021-06-06 MED ORDER — SODIUM CHLORIDE 0.9 % IV SOLN: INTRAVENOUS | Status: DC

## 2021-06-06 MED ORDER — HEPARIN BOLUS VIA INFUSION
3600.0000 [IU] | Freq: Once | INTRAVENOUS | Status: AC
  Administered 2021-06-06: 3600 [IU] via INTRAVENOUS
  Filled 2021-06-06: qty 3600

## 2021-06-06 MED ORDER — ADULT MULTIVITAMIN W/MINERALS CH
1.0000 | ORAL_TABLET | Freq: Every day | ORAL | Status: DC
  Administered 2021-06-08 – 2021-06-15 (×7): 1 via ORAL
  Filled 2021-06-06 (×7): qty 1

## 2021-06-06 MED ORDER — ENSURE ENLIVE PO LIQD: 237.0000 mL | Freq: Two times a day (BID) | ORAL | Status: DC

## 2021-06-06 MED ORDER — NITROGLYCERIN 0.4 MG SL SUBL: 0.4000 mg | SUBLINGUAL_TABLET | SUBLINGUAL | Status: DC | PRN

## 2021-06-06 MED ORDER — HEPARIN (PORCINE) 25000 UT/250ML-% IV SOLN
750.0000 [IU]/h | INTRAVENOUS | Status: DC
  Administered 2021-06-06: 750 [IU]/h via INTRAVENOUS
  Filled 2021-06-06: qty 250

## 2021-06-06 NOTE — Consult Note (Signed)
ORTHOPAEDIC CONSULTATION ? ?REQUESTING PHYSICIAN: Fritzi Mandes, MD ? ?Chief Complaint:   ?L hip pain ? ?History of Present Illness: ?[name] is a 81 y.o. female who had a fall at home yesterday.  The patient noted immediate hip pain and inability to ambulate.  The patient ambulates with a walker at baseline, but did not have her walker when she fell. The patient lives at home with her husband. Pain is worse with any sort of movement.  X-rays in the emergency department show a left displaced femoral neck fracture. ? ?Additionally, patient has history of CHF w/EF of 25%, prior stent placement on Plavix, and CKD. She was also found to have markedly elevated troponin levels in the ED and was started on a heparin drip.  ? ?Past Medical History:  ?Diagnosis Date  ? Anxiety   ? Arthritis   ? CAD (coronary artery disease)   ? a. cardiac cath 10/2014: ostial LAD 30%, prox LAD 90% s/p PCI/DES, ostial RCA 30%, mid RCA 90% s/p PCI/DES, ostial Ramus 50%  ? Chronic back pain   ? Chronic fatigue   ? Chronic systolic CHF (congestive heart failure) ([place])   ? a. 10/2014 Echo: EF 20%; b. 06/2017 Echo: EF 20-25%, diff HK, Gr2 DD, Mild AI. Mod to sev MR. Mod dil LA.  ? Hepatitis C   ? Ischemic cardiomyopathy   ? a. 10/2014 Echo: EF 20%; b. 06/2017 Echo: EF 20-25%.  ? LBBB (left bundle branch block)   ? Moderate to Severe Mitral regurgitation   ? a. 06/2017 Echo: Mod-Sev MR.  ? Osteoporosis   ? UTI (lower urinary tract infection)   ? ?Past Surgical History:  ?Procedure Laterality Date  ? BACK SURGERY  07/14  ? T10, L1  ? CARDIAC CATHETERIZATION Bilateral 10/27/2014  ? Procedure: Left Heart Cath and Coronary Angiography;  Surgeon: Minna Merritts, MD;  Location: [facility];  Service: Cardiovascular;  Laterality: Bilateral;  ? CARDIAC CATHETERIZATION N/A 10/27/2014  ? Procedure: Coronary Stent Intervention;  Surgeon: Wellington Hampshire, MD;  Location: [facility];  Service: Cardiovascular;  Laterality: N/A;  ? ?Social History  ? ?Socioeconomic History  ? Marital status: Married  ?  Spouse name: Aliscia Clayton  ? Number of children: 0  ? Years of education: 35  ? Highest education level: Not on file  ?Occupational History  ? Occupation: Receptionist  ?  Comment: Retired in 1990  ?Tobacco Use  ? Smoking status: Former  ?  Packs/day: 1.00  ?  Years: 10.00  ?  Pack years: 10.00  ?  Types: Cigarettes  ? Smokeless tobacco: Never  ?Vaping Use  ? Vaping Use: Never used  ?Substance and Sexual Activity  ? Alcohol use: No  ?  Alcohol/week: 0.0 standard drinks  ? Drug use: No  ? Sexual activity: Yes  ?  Birth control/protection: Post-menopausal  ?Other Topics Concern  ? Not on file  ?Social History Narrative  ? Pt is 81 yo female, married to husband of 21 years.  Pt has no children.  Pt lives with husband and teacup poodle, [name]. 43 y.o as of 01/01/19.  Pt states she hurt her back last year and has not been able to participate in much physical activity since. Pt enjoys traveling with her husband in motor home, doing crossword puzzles with husband, and playing cards with friends.  Pt was raised in [place] and has lived in [place] entire adult life.  Pt worked in [employer]  plant in [place] until [place] when they closed.    ? ?Social Determinants of Health  ? ?Financial Resource Strain: Low Risk   ? Difficulty of Paying Living Expenses: Not hard at all  ?Food Insecurity: No Food Insecurity  ? Worried About Charity fundraiser in the Last Year: Never true  ? Ran Out of Food in the Last Year: Never true  ?Transportation Needs: No Transportation Needs  ? Lack of Transportation (Medical): No  ? Lack of Transportation (Non-Medical): No  ?Physical Activity: Insufficiently Active  ? Days of Exercise per Week: 5 days  ? Minutes of Exercise per Session: 10 min  ?Stress: No Stress Concern Present  ? Feeling of Stress : Not at all  ?Social Connections: Unknown  ? Frequency of Communication with  Friends and Family: Not on file  ? Frequency of Social Gatherings with Friends and Family: Not on file  ? Attends Religious Services: Not on file  ? Active Member of Clubs or Organizations: Not on file  ? Attends Archivist Meetings: Not on file  ? Marital Status: Married  ? ?Family History  ?Problem Relation Age of Onset  ? Arthritis Mother   ? Heart disease Father   ?     passed at 18  ? Diabetes Father   ? ?Allergies  ?Allergen Reactions  ? Sugar-Protein-Starch Nausea And Vomiting  ?  Only to sugar  ? ?Prior to Admission medications   ?Medication Sig Start Date End Date Taking? Authorizing Provider  ?ALPRAZolam (XANAX) 0.25 MG tablet TAKE ONE TABLET BY MOUTH ONCE TO TWICE DAILY AS NEEDED 04/07/21  Yes McLean-Scocuzza, Nino Glow, MD  ?Calcium-Vitamin A-Vitamin D (LIQUID CALCIUM PO) Take by mouth. 1 tablespoon daily   Yes [provider]  ?citalopram (CELEXA) 10 MG tablet Take 1 tablet (10 mg total) by mouth daily. 06/27/20  Yes McLean-Scocuzza, Nino Glow, MD  ?clopidogrel (PLAVIX) 75 MG tablet TAKE ONE TABLET BY MOUTH ONE TIME DAILY 05/07/21  Yes Minna Merritts, MD  ?ipratropium (ATROVENT) 0.06 % nasal spray Place 2 sprays into both nostrils 2 (two) times daily as needed for rhinitis. 10/03/20  Yes McLean-Scocuzza, Nino Glow, MD  ?metoprolol succinate (TOPROL-XL) 25 MG 24 hr tablet TAKE ONE TABLET BY MOUTH ONE TIME DAILY 05/07/21  Yes Gollan, Kathlene November, MD  ?ondansetron (ZOFRAN-ODT) 4 MG disintegrating tablet TAKE 1 TABLET BY MOUTH TWICE A DAY AS NEEDED FOR NAUSEA AND VOMITING appt further refills no exceptions 06/27/20  Yes McLean-Scocuzza, Nino Glow, MD  ?rosuvastatin (CRESTOR) 40 MG tablet TAKE ONE TABLET BY MOUTH ONE TIME DAILY AT 6PM 05/07/21  Yes Gollan, Kathlene November, MD  ?sacubitril-valsartan (ENTRESTO) 24-26 MG Take 1 tablet by mouth 2 (two) times daily. 11/10/19  Yes Gollan, Kathlene November, MD  ?torsemide (DEMADEX) 20 MG tablet TAKE ONE TABLET BY MOUTH ONE TIME DAILY, EXCEPT DO NOT TAKE ON SUNDAYS 12/01/20  Yes  Gollan, Kathlene November, MD  ?denosumab (PROLIA) 60 MG/ML SOSY injection Inject 60 mg into the skin every 6 (six) months. ?Patient not taking: Reported on 06/05/2021 11/06/17   McLean-Scocuzza, Nino Glow, MD  ?Christianne Dolin HIGH-DOSE QUADRIVALENT 0.7 ML SUSY  12/12/20   [provider]  ?Tomma Rakers COVID-19 VAC BIVALENT injection  12/12/20   [provider]  ? ?Recent Labs  ?  06/05/21 ?1706 06/06/21 ?0350  ?WBC 12.9* 10.3  ?HGB 13.4 12.0  ?HCT 41.3 38.0  ?PLT 177 180  ?K 4.3 4.4  ?CL 103 104  ?CO2 26 26  ?  BUN 24* 21  ?CREATININE 1.47* 1.33*  ?GLUCOSE 159* 127*  ?CALCIUM 9.4 8.7*  ?INR 1.0  --   ? ?CT HEAD WO CONTRAST (5MM) ? ?Result Date: 06/05/2021 ?CLINICAL DATA:  Neck trauma (Age >= 65y); Head trauma, minor (Age >= 65y). Fall EXAM: CT HEAD WITHOUT CONTRAST CT CERVICAL SPINE WITHOUT CONTRAST TECHNIQUE: Multidetector CT imaging of the head and cervical spine was performed following the standard protocol without intravenous contrast. Multiplanar CT image reconstructions of the cervical spine were also generated. RADIATION DOSE REDUCTION: This exam was performed according to the departmental dose-optimization program which includes automated exposure control, adjustment of the mA and/or kV according to patient size and/or use of iterative reconstruction technique. COMPARISON:  None. FINDINGS: CT HEAD FINDINGS Brain: Normal anatomic configuration. Parenchymal volume loss is commensurate with the patient's age. Moderate subcortical and periventricular white matter changes are present likely reflecting the sequela of small vessel ischemia. No abnormal intra or extra-axial mass lesion or fluid collection. No abnormal mass effect or midline shift. No evidence of acute intracranial hemorrhage or infarct. Ventricular size is normal. Cerebellum unremarkable. Vascular: No asymmetric hyperdense vasculature at the skull base. Skull: Intact Sinuses/Orbits: There is complete opacification of the left sphenoid sinus with sclerosis and  thickening of the sinus walls in keeping with changes of chronic sinusitis in this location. Remaining paranasal sinuses are clear. Orbits are unremarkable. Other: Mastoid air cells and middle ear caviti

## 2021-06-06 NOTE — Progress Notes (Signed)
Initial Nutrition Assessment ? ?DOCUMENTATION CODES:  ? ?Not applicable ? ?INTERVENTION:  ? ?Once diet is advanced, add:  ? ?-MVI with minerals daily  ?-Ensure Enlive po BID, each supplement provides 350 kcal and 20 grams of protein.  ? ?NUTRITION DIAGNOSIS:  ? ?Increased nutrient needs related to post-op healing as evidenced by estimated needs. ? ?GOAL:  ? ?Patient will meet greater than or equal to 90% of their needs ? ?MONITOR:  ? ?PO intake, Supplement acceptance, Diet advancement, Labs, Weight trends, Skin, I & O's ? ?REASON FOR ASSESSMENT:  ? ?Consult ?Assessment of nutrition requirement/status, Hip fracture protocol ? ?ASSESSMENT:  ? ?[name] is a 81 y.o. Caucasian female with medical history significant for coronary artery disease, anxiety, osteoarthritis, ischemic cardiomyopathy, left bundle branch block, moderate-severe mitral regurgitation and UTI, who presented to the ER with acute onset of accidental mechanical fall.  The patient usually uses a walker.  She got up without her walker and went to the window when she lost her balance and fell on her left side with subsequent left hip pain and inability to move.  She denies any presyncope or syncope.  No chest pain or palpitations.  No paresthesias or focal muscle weakness.  She had nausea and vomiting in the ER.  No dysuria, oliguria or hematuria or flank pain. ? ?Pt admitted with closed lt hip fracture.  ?  ?Reviewed I/O's: +921 ml x 24 hours ? ?Pt unavailable at time of visit. Attempted to speak with pt via call to hospital room phone, however, unable to reach. RD unable to obtain further nutrition-related history or complete nutrition-focused physical exam at this time.   ? ?Per orthopedics notes, plan for lt hemiarthroplasty; awaiting cardiology clearance.  ? ?Reviewed wt hx; wt has been stable over the past year. ? ?Pt with increased needs for post-op healing and would benefit from addition of oral nutrition supplements.   ? ?Medications  reviewed and include colace and lasix.  ? ?Labs reviewed: CBGS: 123.   ? ?Diet Order:   ?Diet Order   ? ?       ?  Diet NPO time specified  Diet effective midnight       ?  ? ?  ?  ? ?  ? ? ?EDUCATION NEEDS:  ? ?No education needs have been identified at this time ? ?Skin:  Skin Assessment: Reviewed RN Assessment ? ?Last BM:  Unknown ? ?Height:  ? ?Ht Readings from Last 1 Encounters:  ?05/08/21 5' (1.524 m)  ? ? ?Weight:  ? ?Wt Readings from Last 1 Encounters:  ?06/05/21 68 kg  ? ? ?Ideal Body Weight:  45.5 kg ? ?BMI:  Body mass index is 29.29 kg/m?. ? ?Estimated Nutritional Needs:  ? ?Kcal:  1700-1900 ? ?Protein:  85-100 grams ? ?Fluid:  > 1.7 L ? ? ? ?Loistine Chance, RD, LDN, CDCES ?Registered Dietitian II ?Certified Diabetes Care and Education Specialist ?Please refer to [facility] for RD and/or RD on-call/weekend/after hours pager  ?

## 2021-06-06 NOTE — Assessment & Plan Note (Addendum)
-   s/p mechanical fall ?- underwent left hip hemiarthroplasty on 06/07/2021 ?-Continue working with physical therapy.  ?- discharge to [facility] for further rehab ?- on asa/plavix ?

## 2021-06-06 NOTE — Plan of Care (Signed)
  Problem: Education: Goal: Knowledge of General Education information will improve Description: Including pain rating scale, medication(s)/side effects and non-pharmacologic comfort measures Outcome: Progressing   Problem: Health Behavior/Discharge Planning: Goal: Ability to manage health-related needs will improve Outcome: Progressing   Problem: Clinical Measurements: Goal: Respiratory complications will improve Outcome: Progressing   Problem: Clinical Measurements: Goal: Cardiovascular complication will be avoided Outcome: Progressing   Problem: Nutrition: Goal: Adequate nutrition will be maintained Outcome: Progressing   

## 2021-06-06 NOTE — Assessment & Plan Note (Addendum)
-   was initially started on IV heparin with uptrending trops ?- s/p LHC on 4/12; received 2 DES (prox and mid LAD) ?- continue plavix, toprol ?- statin seems to have been held previously due to elevated LFTs ?- LFTs now improved and normalized; statin has been resumed  ?

## 2021-06-06 NOTE — Consult Note (Signed)
?Cardiology Consultation:  ? ?Patient ID: [name] ?MRN: [id number]; DOB: [birth year] ? ?Admit date: 06/05/2021 ?Date of Consult: 06/06/2021 ? ?PCP:  McLean-Scocuzza, Nino Glow, MD ?  ?[facility] Providers ?Cardiologist:  Ida Rogue, MD  ?Cardiology APP:  Alisa Graff, FNP     ? ? ?Patient Profile:  ? ?[name] is a 81 y.o. female with a hx of CHF EF 25%, CAD/PCI LAD and RCA 2016 who is being seen 06/06/2021 for the evaluation of elevated troponins at the request of Dr. Posey Pronto. ? ?History of Present Illness:  ? ?[name] is a 81 year old female with history of ischemic cardiomyopathy EF 25%, CAD/PCI to mid RCA, proximal LAD in 2016, moderate to severe MR, CKD who presents secondary to a fall. ? ?Patient has a history of wobbly gait, usually uses a cane or walker.  Yesterday she was walking without her walker when she suddenly stumbled and fell causing her to present to the ED.  Work-up revealed left hip fracture, surgical intervention is being planned. ? ?She denies chest pain or shortness of breath.  Last echocardiogram was in 2019 showing severely reduced EF.  EKG showed sinus rhythm with left bundle branch block, troponins elevated at 926, then 1777.  Started on heparin drip per ACS protocol. ? ?Patient currently in severe left hip pain. ? ? ?Past Medical History:  ?Diagnosis Date  ? Anxiety   ? Arthritis   ? CAD (coronary artery disease)   ? a. cardiac cath 10/2014: ostial LAD 30%, prox LAD 90% s/p PCI/DES, ostial RCA 30%, mid RCA 90% s/p PCI/DES, ostial Ramus 50%  ? Chronic back pain   ? Chronic fatigue   ? Chronic systolic CHF (congestive heart failure) ([place])   ? a. 10/2014 Echo: EF 20%; b. 06/2017 Echo: EF 20-25%, diff HK, Gr2 DD, Mild AI. Mod to sev MR. Mod dil LA.  ? Hepatitis C   ? Ischemic cardiomyopathy   ? a. 10/2014 Echo: EF 20%; b. 06/2017 Echo: EF 20-25%.  ? LBBB (left bundle branch block)   ? Moderate to Severe Mitral regurgitation   ? a. 06/2017 Echo: Mod-Sev MR.  ? Osteoporosis   ? UTI (lower  urinary tract infection)   ? ? ?Past Surgical History:  ?Procedure Laterality Date  ? BACK SURGERY  07/14  ? T10, L1  ? CARDIAC CATHETERIZATION Bilateral 10/27/2014  ? Procedure: Left Heart Cath and Coronary Angiography;  Surgeon: Minna Merritts, MD;  Location: [facility];  Service: Cardiovascular;  Laterality: Bilateral;  ? CARDIAC CATHETERIZATION N/A 10/27/2014  ? Procedure: Coronary Stent Intervention;  Surgeon: Wellington Hampshire, MD;  Location: [facility];  Service: Cardiovascular;  Laterality: N/A;  ?  ? ?Home Medications:  ?Prior to Admission medications   ?Medication Sig Start Date End Date Taking? Authorizing Provider  ?ALPRAZolam (XANAX) 0.25 MG tablet TAKE ONE TABLET BY MOUTH ONCE TO TWICE DAILY AS NEEDED 04/07/21  Yes McLean-Scocuzza, Nino Glow, MD  ?Calcium-Vitamin A-Vitamin D (LIQUID CALCIUM PO) Take by mouth. 1 tablespoon daily   Yes [provider]  ?citalopram (CELEXA) 10 MG tablet Take 1 tablet (10 mg total) by mouth daily. 06/27/20  Yes McLean-Scocuzza, Nino Glow, MD  ?clopidogrel (PLAVIX) 75 MG tablet TAKE ONE TABLET BY MOUTH ONE TIME DAILY 05/07/21  Yes Minna Merritts, MD  ?ipratropium (ATROVENT) 0.06 % nasal spray Place 2 sprays into both nostrils 2 (two) times daily as needed for rhinitis. 10/03/20  Yes McLean-Scocuzza, Nino Glow, MD  ?  metoprolol succinate (TOPROL-XL) 25 MG 24 hr tablet TAKE ONE TABLET BY MOUTH ONE TIME DAILY 05/07/21  Yes Gollan, Kathlene November, MD  ?ondansetron (ZOFRAN-ODT) 4 MG disintegrating tablet TAKE 1 TABLET BY MOUTH TWICE A DAY AS NEEDED FOR NAUSEA AND VOMITING appt further refills no exceptions 06/27/20  Yes McLean-Scocuzza, Nino Glow, MD  ?rosuvastatin (CRESTOR) 40 MG tablet TAKE ONE TABLET BY MOUTH ONE TIME DAILY AT 6PM 05/07/21  Yes Gollan, Kathlene November, MD  ?sacubitril-valsartan (ENTRESTO) 24-26 MG Take 1 tablet by mouth 2 (two) times daily. 11/10/19  Yes Gollan, Kathlene November, MD  ?torsemide (DEMADEX) 20 MG tablet TAKE ONE TABLET BY MOUTH ONE TIME DAILY, EXCEPT DO  NOT TAKE ON SUNDAYS 12/01/20  Yes Gollan, Kathlene November, MD  ?denosumab (PROLIA) 60 MG/ML SOSY injection Inject 60 mg into the skin every 6 (six) months. ?Patient not taking: Reported on 06/05/2021 11/06/17   McLean-Scocuzza, Nino Glow, MD  ?Christianne Dolin HIGH-DOSE QUADRIVALENT 0.7 ML SUSY  12/12/20   [provider]  ?Tomma Rakers COVID-19 VAC BIVALENT injection  12/12/20   [provider]  ? ? ?Inpatient Medications: ?Scheduled Meds: ? citalopram  10 mg Oral Daily  ? clopidogrel  75 mg Oral Daily  ? docusate sodium  100 mg Oral BID  ? fentaNYL (SUBLIMAZE) injection  50 mcg Intravenous Once  ? furosemide  40 mg Intravenous Daily  ? metoprolol succinate  25 mg Oral Daily  ? rosuvastatin  40 mg Oral Daily  ? sacubitril-valsartan  1 tablet Oral BID  ? ?Continuous Infusions: ? sodium chloride 100 mL/hr at 06/05/21 2048  ? heparin 750 Units/hr (06/06/21 0334)  ? ?PRN Meds: ?acetaminophen, acetaminophen, ALPRAZolam, HYDROcodone-acetaminophen, ipratropium, magnesium hydroxide, morphine injection, nitroGLYCERIN, ondansetron (ZOFRAN) IV, ondansetron, traZODone ? ?Allergies:    ?Allergies  ?Allergen Reactions  ? Sugar-Protein-Starch Nausea And Vomiting  ?  Only to sugar  ? ? ?Social History:   ?Social History  ? ?Socioeconomic History  ? Marital status: Married  ?  Spouse name: Khristie Sak  ? Number of children: 0  ? Years of education: 51  ? Highest education level: Not on file  ?Occupational History  ? Occupation: Receptionist  ?  Comment: Retired in 1990  ?Tobacco Use  ? Smoking status: Former  ?  Packs/day: 1.00  ?  Years: 10.00  ?  Pack years: 10.00  ?  Types: Cigarettes  ? Smokeless tobacco: Never  ?Vaping Use  ? Vaping Use: Never used  ?Substance and Sexual Activity  ? Alcohol use: No  ?  Alcohol/week: 0.0 standard drinks  ? Drug use: No  ? Sexual activity: Yes  ?  Birth control/protection: Post-menopausal  ?Other Topics Concern  ? Not on file  ?Social History Narrative  ? Pt is 81 yo female, married to husband of 21 years.   Pt has no children.  Pt lives with husband and teacup poodle, [name]. 53 y.o as of 01/01/19.  Pt states she hurt her back last year and has not been able to participate in much physical activity since. Pt enjoys traveling with her husband in motor home, doing crossword puzzles with husband, and playing cards with friends.  Pt was raised in [place] and has lived in [place] entire adult life.  Pt worked in [employer] in [place] until [place] when they closed.    ? ?Social Determinants of Health  ? ?Financial Resource Strain: Low Risk   ? Difficulty of Paying Living Expenses: Not hard at all  ?Food Insecurity: No Food  Insecurity  ? Worried About Charity fundraiser in the Last Year: Never true  ? Ran Out of Food in the Last Year: Never true  ?Transportation Needs: No Transportation Needs  ? Lack of Transportation (Medical): No  ? Lack of Transportation (Non-Medical): No  ?Physical Activity: Insufficiently Active  ? Days of Exercise per Week: 5 days  ? Minutes of Exercise per Session: 10 min  ?Stress: No Stress Concern Present  ? Feeling of Stress : Not at all  ?Social Connections: Unknown  ? Frequency of Communication with Friends and Family: Not on file  ? Frequency of Social Gatherings with Friends and Family: Not on file  ? Attends Religious Services: Not on file  ? Active Member of Clubs or Organizations: Not on file  ? Attends Archivist Meetings: Not on file  ? Marital Status: Married  ?Intimate Partner Violence: Not At Risk  ? Fear of Current or Ex-Partner: No  ? Emotionally Abused: No  ? Physically Abused: No  ? Sexually Abused: No  ?  ?Family History:   ? ?Family History  ?Problem Relation Age of Onset  ? Arthritis Mother   ? Heart disease Father   ?     passed at 87  ? Diabetes Father   ?  ? ?ROS:  ?Please see the history of present illness.  ? ?All other ROS reviewed and negative.    ? ?Physical Exam/Data:  ? ?Vitals:  ? 06/05/21 1930 06/05/21 2017 06/06/21 2542 06/06/21 0719  ?BP: (!)  128/107 140/61 (!) 114/53 129/61  ?Pulse: (!) 107 (!) 106 85 84  ?Resp: 17 20 20 16   ?Temp: 98.9 ?F (37.2 ?C) 98.1 ?F (36.7 ?C) 97.9 ?F (36.6 ?C) 98.3 ?F (36.8 ?C)  ?TempSrc: Oral     ?SpO2: 93% 93% 98%

## 2021-06-06 NOTE — Progress Notes (Signed)
OR availability at 1030 am tomorrow, 06/07/21. Plan for surgery then. Discussed with patient, primary team, and cardiology team. All in agreement to proceed then.  ? ?NPO after midnight ?Hold heparin in advance of surgery.  ? ?

## 2021-06-06 NOTE — Progress Notes (Signed)
ANTICOAGULATION CONSULT NOTE ? ?Pharmacy Consult for heparin infusion ?Indication: ACS/STEMI ? ?Allergies  ?Allergen Reactions  ? Sugar-Protein-Starch Nausea And Vomiting  ?  Only to sugar  ? ? ?Patient Measurements: ?Weight: 68 kg (150 lb) ?Heparin Dosing Weight: 60.2 kg ? ?Vital Signs: ?Temp: 98.1 ?F (36.7 ?C) (04/04 2017) ?Temp Source: Oral (04/04 1930) ?BP: 140/61 (04/04 2017) ?Pulse Rate: 106 (04/04 2017) ? ?Labs: ?Recent Labs  ?  06/05/21 ?1706 06/05/21 ?1922 06/06/21 ?5638  ?HGB 13.4  --   --   ?HCT 41.3  --   --   ?PLT 177  --   --   ?APTT 31  --   --   ?LABPROT 13.0  --   --   ?INR 1.0  --   --   ?CREATININE 1.47*  --   --   ?CKTOTAL 68  --   --   ?TROPONINIHS 22* 42* 926*  ? ? ?Estimated Creatinine Clearance: 26.7 mL/min (A) (by C-G formula based on SCr of 1.47 mg/dL (H)). ? ? ?Medical History: ?Past Medical History:  ?Diagnosis Date  ? Anxiety   ? Arthritis   ? CAD (coronary artery disease)   ? a. cardiac cath 10/2014: ostial LAD 30%, prox LAD 90% s/p PCI/DES, ostial RCA 30%, mid RCA 90% s/p PCI/DES, ostial Ramus 50%  ? Chronic back pain   ? Chronic fatigue   ? Chronic systolic CHF (congestive heart failure) ([place])   ? a. 10/2014 Echo: EF 20%; b. 06/2017 Echo: EF 20-25%, diff HK, Gr2 DD, Mild AI. Mod to sev MR. Mod dil LA.  ? Hepatitis C   ? Ischemic cardiomyopathy   ? a. 10/2014 Echo: EF 20%; b. 06/2017 Echo: EF 20-25%.  ? LBBB (left bundle branch block)   ? Moderate to Severe Mitral regurgitation   ? a. 06/2017 Echo: Mod-Sev MR.  ? Osteoporosis   ? UTI (lower urinary tract infection)   ? ? ?Assessment: ?Pt is 81 yo female presenting to ED after falling w/ closed left hip fracture, also found with elevated troponin I, trending up. ? ?Goal of Therapy:  ?Heparin level 0.3-0.7 units/ml ?Monitor platelets by anticoagulation protocol: Yes ?  ?Plan:  ?Bolus 3600 units x 1 ?Start heparin infusion at 750 units/hr ?Check HL in 8 hours after start of infusion ?CBC daily while on heparin ? ?Renda Rolls, PharmD,  MBA ?06/06/2021 ?2:26 AM ? ? ? ?

## 2021-06-06 NOTE — Progress Notes (Signed)
[facility] [facility] ? ? ?PATIENT NAME: [name]   ? ?MR#:  [id number] ? ?DATE OF BIRTH:  February 24, 1941 ? ?SUBJECTIVE:  ? ?husband at bedside. Patient came in after mechanical fall. She usually walks with walker. Started having left hip pain. Found to have left impacted femoral neck fracture. ?Patient was also noted to have elevated troponin. No chest pain or shortness of breath. Currently on IV heparin drip. Seen earlier by cardiology. ? ? ?VITALS:  ?Blood pressure 129/61, pulse 87, temperature 98.1 ?F (36.7 ?C), resp. rate 18, weight 68 kg, SpO2 97 %. ? ?PHYSICAL EXAMINATION:  ? ?GENERAL:  81 y.o.-year-old patient lying in the bed with no acute distress.  ?LUNGS: Normal breath sounds bilaterally, no wheezing, rales, rhonchi.  ?CARDIOVASCULAR: S1, S2 normal. No murmurs, rubs, or gallops.  ?ABDOMEN: Soft, nontender, nondistended. Bowel sounds present.  ?EXTREMITIES: left hip ROM+ ?NEUROLOGIC: nonfocal  patient is alert and awake ?SKIN: No obvious rash, lesion, or ulcer.  ? ?LABORATORY PANEL:  ?CBC ?Recent Labs  ?Lab 06/06/21 ?0350  ?WBC 10.3  ?HGB 12.0  ?HCT 38.0  ?PLT 180  ? ? ?Chemistries  ?Recent Labs  ?Lab 06/05/21 ?1706 06/06/21 ?0350  ?NA 140 139  ?K 4.3 4.4  ?CL 103 104  ?CO2 26 26  ?GLUCOSE 159* 127*  ?BUN 24* 21  ?CREATININE 1.47* 1.33*  ?CALCIUM 9.4 8.7*  ?AST 29  --   ?ALT 19  --   ?ALKPHOS 54  --   ?BILITOT 1.2  --   ? ?Cardiac Enzymes ?No results for input(s): TROPONINI in the last 168 hours. ?RADIOLOGY:  ?CT HEAD WO CONTRAST (5MM) ? ?Result Date: 06/05/2021 ?CLINICAL DATA:  Neck trauma (Age >= 65y); Head trauma, minor (Age >= 65y). Fall EXAM: CT HEAD WITHOUT CONTRAST CT CERVICAL SPINE WITHOUT CONTRAST TECHNIQUE: Multidetector CT imaging of the head and cervical spine was performed following the standard protocol without intravenous contrast. Multiplanar CT image reconstructions of the cervical spine were also generated. RADIATION DOSE REDUCTION: This exam was performed  according to the departmental dose-optimization program which includes automated exposure control, adjustment of the mA and/or kV according to patient size and/or use of iterative reconstruction technique. COMPARISON:  None. FINDINGS: CT HEAD FINDINGS Brain: Normal anatomic configuration. Parenchymal volume loss is commensurate with the patient's age. Moderate subcortical and periventricular white matter changes are present likely reflecting the sequela of small vessel ischemia. No abnormal intra or extra-axial mass lesion or fluid collection. No abnormal mass effect or midline shift. No evidence of acute intracranial hemorrhage or infarct. Ventricular size is normal. Cerebellum unremarkable. Vascular: No asymmetric hyperdense vasculature at the skull base. Skull: Intact Sinuses/Orbits: There is complete opacification of the left sphenoid sinus with sclerosis and thickening of the sinus walls in keeping with changes of chronic sinusitis in this location. Remaining paranasal sinuses are clear. Orbits are unremarkable. Other: Mastoid air cells and middle ear cavities are clear. CT CERVICAL SPINE FINDINGS Alignment: Normal. Skull base and vertebrae: Craniocervical alignment is normal. The atlantodental interval is not widened. Moderate degenerate arthritis at the atlantodental articulation. No acute fracture of the cervical spine. Vertebral body height is preserved. Soft tissues and spinal canal: No prevertebral fluid or swelling. No visible canal hematoma. Disc levels: There is intervertebral disc space narrowing and endplate remodeling at Y4-I3 in keeping with changes of moderate degenerative disc disease. The prevertebral soft tissues are not thickened on sagittal reformats. Spinal canal is widely patent. Review of the axial images demonstrates  none mild uncovertebral and facet arthrosis without significant neuroforaminal narrowing. Upper chest: Unremarkable Other: None IMPRESSION: No acute intracranial abnormality.   No calvarial fracture. Chronic left sphenoid sinusitis. No acute fracture or listhesis of the cervical spine. Electronically Signed   By: Fidela Salisbury M.D.   On: 06/05/2021 19:24  ? ?CT Angio Chest PE W and/or Wo Contrast ? ?Result Date: 06/05/2021 ?CLINICAL DATA:  Pulmonary embolism (PE) suspected, high prob. Hypoxia. EXAM: CT ANGIOGRAPHY CHEST WITH CONTRAST TECHNIQUE: Multidetector CT imaging of the chest was performed using the standard protocol during bolus administration of intravenous contrast. Multiplanar CT image reconstructions and MIPs were obtained to evaluate the vascular anatomy. RADIATION DOSE REDUCTION: This exam was performed according to the departmental dose-optimization program which includes automated exposure control, adjustment of the mA and/or kV according to patient size and/or use of iterative reconstruction technique. CONTRAST:  52mL OMNIPAQUE IOHEXOL 350 MG/ML SOLN COMPARISON:  None. FINDINGS: Cardiovascular: Evaluation is moderately limited by motion artifact. The central pulmonary arteries are of normal caliber. No intraluminal filling defect identified to suggest acute pulmonary embolism. Moderate coronary artery calcification. Mild cardiomegaly with left ventricular dilation noted. Small pericardial effusion without CT evidence of cardiac tamponade. Moderate atherosclerotic calcification within the thoracic aorta. No aortic aneurysm. Mediastinum/Nodes: Visualized thyroid is unremarkable. No pathologic thoracic adenopathy. Esophagus is unremarkable. Lungs/Pleura: Lungs are clear. No pleural effusion or pneumothorax. Upper Abdomen: No acute abnormality. Musculoskeletal: The osseous structures diffusely osteopenic. There are remote appearing compression deformities of T5, T6, and T7, new at T5 and 6 since prior chest radiograph of 04/17/2017. Mild compression deformity of T9 and severe compression deformity at T10 status post vertebroplasty are unchanged. Multiple healed right rib  fractures are identified. No acute bone abnormality. Review of the MIP images confirms the above findings. IMPRESSION: No acute pulmonary embolism. Moderate coronary artery calcification. Mild cardiomegaly with moderate left ventricular dilation. Osteopenia with multiple thoracic compression fractures, new at T5 and T6 since prior chest radiograph of 04/17/2017, but remote in appearance. Aortic Atherosclerosis (ICD10-I70.0). Electronically Signed   By: Fidela Salisbury M.D.   On: 06/05/2021 19:15  ? ?CT Cervical Spine Wo Contrast ? ?Result Date: 06/05/2021 ?CLINICAL DATA:  Neck trauma (Age >= 65y); Head trauma, minor (Age >= 65y). Fall EXAM: CT HEAD WITHOUT CONTRAST CT CERVICAL SPINE WITHOUT CONTRAST TECHNIQUE: Multidetector CT imaging of the head and cervical spine was performed following the standard protocol without intravenous contrast. Multiplanar CT image reconstructions of the cervical spine were also generated. RADIATION DOSE REDUCTION: This exam was performed according to the departmental dose-optimization program which includes automated exposure control, adjustment of the mA and/or kV according to patient size and/or use of iterative reconstruction technique. COMPARISON:  None. FINDINGS: CT HEAD FINDINGS Brain: Normal anatomic configuration. Parenchymal volume loss is commensurate with the patient's age. Moderate subcortical and periventricular white matter changes are present likely reflecting the sequela of small vessel ischemia. No abnormal intra or extra-axial mass lesion or fluid collection. No abnormal mass effect or midline shift. No evidence of acute intracranial hemorrhage or infarct. Ventricular size is normal. Cerebellum unremarkable. Vascular: No asymmetric hyperdense vasculature at the skull base. Skull: Intact Sinuses/Orbits: There is complete opacification of the left sphenoid sinus with sclerosis and thickening of the sinus walls in keeping with changes of chronic sinusitis in this location.  Remaining paranasal sinuses are clear. Orbits are unremarkable. Other: Mastoid air cells and middle ear cavities are clear. CT CERVICAL SPINE FINDINGS Alignment: Normal. Skull base and vertebrae: Craniocervical

## 2021-06-06 NOTE — Plan of Care (Signed)
?  Problem: Education: ?Goal: Knowledge of General Education information will improve ?Description: Including pain rating scale, medication(s)/side effects and non-pharmacologic comfort measures ?06/06/2021 0621 by Fatima Blank, RN ?Outcome: Progressing ?06/06/2021 0617 by Fatima Blank, RN ?Outcome: Progressing ?  ?Problem: Health Behavior/Discharge Planning: ?Goal: Ability to manage health-related needs will improve ?06/06/2021 0621 by Fatima Blank, RN ?Outcome: Progressing ?06/06/2021 0617 by Fatima Blank, RN ?Outcome: Progressing ?  ?Problem: Clinical Measurements: ?Goal: Ability to maintain clinical measurements within normal limits will improve ?06/06/2021 0621 by Fatima Blank, RN ?Outcome: Progressing ?06/06/2021 0617 by Fatima Blank, RN ?Outcome: Progressing ?Goal: Will remain free from infection ?06/06/2021 0621 by Fatima Blank, RN ?Outcome: Progressing ?06/06/2021 0617 by Fatima Blank, RN ?Outcome: Progressing ?Goal: Diagnostic test results will improve ?06/06/2021 0621 by Fatima Blank, RN ?Outcome: Progressing ?06/06/2021 0617 by Fatima Blank, RN ?Outcome: Progressing ?Goal: Respiratory complications will improve ?06/06/2021 0621 by Fatima Blank, RN ?Outcome: Progressing ?06/06/2021 0617 by Fatima Blank, RN ?Outcome: Progressing ?Goal: Cardiovascular complication will be avoided ?06/06/2021 0621 by Fatima Blank, RN ?Outcome: Progressing ?06/06/2021 0617 by Fatima Blank, RN ?Outcome: Progressing ?  ?Problem: Activity: ?Goal: Risk for activity intolerance will decrease ?06/06/2021 0621 by Fatima Blank, RN ?Outcome: Progressing ?06/06/2021 0617 by Fatima Blank, RN ?Outcome: Progressing ?  ?Problem: Nutrition: ?Goal: Adequate nutrition will be maintained ?06/06/2021 0621 by Fatima Blank, RN ?Outcome: Progressing ?06/06/2021 0617 by Fatima Blank, RN ?Outcome: Progressing ?  ?Problem:  Coping: ?Goal: Level of anxiety will decrease ?06/06/2021 0621 by Fatima Blank, RN ?Outcome: Progressing ?06/06/2021 0617 by Fatima Blank, RN ?Outcome: Progressing ?  ?Problem: Elimination: ?Goal: Will not experience complications related to bowel motility ?06/06/2021 0621 by Fatima Blank, RN ?Outcome: Progressing ?06/06/2021 0617 by Fatima Blank, RN ?Outcome: Progressing ?Goal: Will not experience complications related to urinary retention ?06/06/2021 0621 by Fatima Blank, RN ?Outcome: Progressing ?06/06/2021 0617 by Fatima Blank, RN ?Outcome: Progressing ?  ?Problem: Pain Managment: ?Goal: General experience of comfort will improve ?06/06/2021 0621 by Fatima Blank, RN ?Outcome: Progressing ?06/06/2021 0617 by Fatima Blank, RN ?Outcome: Progressing ?  ?Problem: Safety: ?Goal: Ability to remain free from injury will improve ?06/06/2021 0621 by Fatima Blank, RN ?Outcome: Progressing ?06/06/2021 0617 by Fatima Blank, RN ?Outcome: Progressing ?  ?Problem: Skin Integrity: ?Goal: Risk for impaired skin integrity will decrease ?06/06/2021 0621 by Fatima Blank, RN ?Outcome: Progressing ?06/06/2021 0617 by Fatima Blank, RN ?Outcome: Progressing ?  ?

## 2021-06-06 NOTE — Progress Notes (Signed)
ANTICOAGULATION CONSULT NOTE ? ?Pharmacy Consult for Heparin Infusion ?Indication: ACS/STEMI ? ?Patient Measurements: ?Weight: 68 kg (150 lb) ?Heparin Dosing Weight: 60.2 kg ? ?Labs: ?Recent Labs  ?  06/05/21 ?1706 06/05/21 ?1922 06/06/21 ?0350 06/06/21 ?1132 06/06/21 ?1321 06/06/21 ?2005  ?HGB 13.4  --  12.0  --   --   --   ?HCT 41.3  --  38.0  --   --   --   ?PLT 177  --  180  --   --   --   ?APTT 31  --   --   --   --   --   ?LABPROT 13.0  --   --   --   --   --   ?INR 1.0  --   --   --   --   --   ?HEPARINUNFRC  --   --   --  0.59  --  0.36  ?CREATININE 1.47*  --  1.33*  --   --   --   ?CKTOTAL 68  --   --   --   --   --   ?TROPONINIHS 22*   < > 1,777* 1,935* 1,888*  --   ? < > = values in this interval not displayed.  ? ? ? ?Estimated Creatinine Clearance: 29.5 mL/min (A) (by C-G formula based on SCr of 1.33 mg/dL (H)). ? ? ?Medical History: ?Past Medical History:  ?Diagnosis Date  ? Anxiety   ? Arthritis   ? CAD (coronary artery disease)   ? a. cardiac cath 10/2014: ostial LAD 30%, prox LAD 90% s/p PCI/DES, ostial RCA 30%, mid RCA 90% s/p PCI/DES, ostial Ramus 50%  ? Chronic back pain   ? Chronic fatigue   ? Chronic systolic CHF (congestive heart failure) ([place])   ? a. 10/2014 Echo: EF 20%; b. 06/2017 Echo: EF 20-25%, diff HK, Gr2 DD, Mild AI. Mod to sev MR. Mod dil LA.  ? Hepatitis C   ? Ischemic cardiomyopathy   ? a. 10/2014 Echo: EF 20%; b. 06/2017 Echo: EF 20-25%.  ? LBBB (left bundle branch block)   ? Moderate to Severe Mitral regurgitation   ? a. 06/2017 Echo: Mod-Sev MR.  ? Osteoporosis   ? UTI (lower urinary tract infection)   ? ? ?Assessment: ?Pt is 81 yo female presenting to ED after falling w/ closed left hip fracture, also found with elevated troponin I, trending up. ? ?Goal of Therapy:  ?Heparin level 0.3-0.7 units/ml ?Monitor platelets by anticoagulation protocol: Yes ?  ?Plan:  ?--Heparin level is therapeutic x 2 ?--Maintain heparin infusion at 750 units/hr ?--Next heparin level tomorrow AM ?--Daily  CBC per protocol while on IV heparin ?--Plan to continue heparin x 48 hours per cardiology ?--Patient is scheduled for L hip hemiarthroplasty 4/6 at 1030 am ? ?[name]  ?06/06/2021 ?8:30 PM ? ? ? ?

## 2021-06-06 NOTE — Progress Notes (Signed)
*  PRELIMINARY RESULTS* ?Echocardiogram ?2D Echocardiogram has been performed. ? ?[name], [name] ?06/06/2021, 1:31 PM ?

## 2021-06-06 NOTE — Plan of Care (Signed)

## 2021-06-06 NOTE — Assessment & Plan Note (Addendum)
-   crestor resumed; LFTs improved ?

## 2021-06-06 NOTE — Telephone Encounter (Signed)
Patient having surgery she will call ands schedule once she is well enough to come into office. ?

## 2021-06-06 NOTE — Assessment & Plan Note (Addendum)
-   Entresto discontinued due to soft blood pressures ?-Cardiology followed in hospital ?- diuretic changed to torsemide 20 mg daily at discharge ?- Continue Toprol ?-Echo this hospitalization shows EF 25 to 30%, global hypokinesis, grade 2 diastolic dysfunction, mildly enlarged RV; LVH ?

## 2021-06-06 NOTE — Progress Notes (Signed)
ANTICOAGULATION CONSULT NOTE ? ?Pharmacy Consult for heparin infusion ?Indication: ACS/STEMI ? ?Allergies  ?Allergen Reactions  ? Sugar-Protein-Starch Nausea And Vomiting  ?  Only to sugar  ? ? ?Patient Measurements: ?Weight: 68 kg (150 lb) ?Heparin Dosing Weight: 60.2 kg ? ?Vital Signs: ?Temp: 98.3 ?F (36.8 ?C) (04/05 0719) ?BP: 129/61 (04/05 0719) ?Pulse Rate: 84 (04/05 0719) ? ?Labs: ?Recent Labs  ?  06/05/21 ?1706 06/05/21 ?1922 06/06/21 ?2706 06/06/21 ?0350 06/06/21 ?1132  ?HGB 13.4  --   --  12.0  --   ?HCT 41.3  --   --  38.0  --   ?PLT 177  --   --  180  --   ?APTT 31  --   --   --   --   ?LABPROT 13.0  --   --   --   --   ?INR 1.0  --   --   --   --   ?HEPARINUNFRC  --   --   --   --  0.59  ?CREATININE 1.47*  --   --  1.33*  --   ?CKTOTAL 68  --   --   --   --   ?TROPONINIHS 22* 42* 926* 1,777*  --   ? ? ? ?Estimated Creatinine Clearance: 29.5 mL/min (A) (by C-G formula based on SCr of 1.33 mg/dL (H)). ? ? ?Medical History: ?Past Medical History:  ?Diagnosis Date  ? Anxiety   ? Arthritis   ? CAD (coronary artery disease)   ? a. cardiac cath 10/2014: ostial LAD 30%, prox LAD 90% s/p PCI/DES, ostial RCA 30%, mid RCA 90% s/p PCI/DES, ostial Ramus 50%  ? Chronic back pain   ? Chronic fatigue   ? Chronic systolic CHF (congestive heart failure) ([place])   ? a. 10/2014 Echo: EF 20%; b. 06/2017 Echo: EF 20-25%, diff HK, Gr2 DD, Mild AI. Mod to sev MR. Mod dil LA.  ? Hepatitis C   ? Ischemic cardiomyopathy   ? a. 10/2014 Echo: EF 20%; b. 06/2017 Echo: EF 20-25%.  ? LBBB (left bundle branch block)   ? Moderate to Severe Mitral regurgitation   ? a. 06/2017 Echo: Mod-Sev MR.  ? Osteoporosis   ? UTI (lower urinary tract infection)   ? ? ?Assessment: ?Pt is 81 yo female presenting to ED after falling w/ closed left hip fracture, also found with elevated troponin I, trending up. Pharmacy has been consulted for heparin dosing for ACS.  ? ?Baseline labs: Hgb 13.4, plts 177, aPTT 31, INR 1.0 ? ?Date Time HL Rate/comment ?4/5   1132 0.59 750 un/hr, therapeutic  ? ?Goal of Therapy:  ?Heparin level 0.3-0.7 units/ml ?Monitor platelets by anticoagulation protocol: Yes ?  ?Plan:  ?Heparin level 0.59, therapeutic ?Continue heparin infusion at 750 units/hr ?Check confirmatory HL in 8 hours  ?Monitor daily CBC, s/s of bleed  ? ?Darnelle Bos, PharmD  ?06/06/2021 ?12:03 PM ? ? ? ?

## 2021-06-06 NOTE — Assessment & Plan Note (Addendum)
-   soft BP, requiring midodrine ?- currently on lasix, Toprol ?- ARB on hold until further BP improvement ?

## 2021-06-07 ENCOUNTER — Inpatient Hospital Stay: Payer: Medicare HMO

## 2021-06-07 ENCOUNTER — Inpatient Hospital Stay: Payer: Medicare HMO | Admitting: Anesthesiology

## 2021-06-07 ENCOUNTER — Encounter: Payer: Self-pay | Admitting: Family Medicine

## 2021-06-07 ENCOUNTER — Encounter
Admission: EM | Disposition: A | Discharge status: Skilled Nursing Facility | Payer: Self-pay | Source: Home / Self Care | Attending: Internal Medicine

## 2021-06-07 ENCOUNTER — Other Ambulatory Visit: Payer: Self-pay

## 2021-06-07 DIAGNOSIS — I10 Essential (primary) hypertension: Secondary | ICD-10-CM | POA: Diagnosis not present

## 2021-06-07 DIAGNOSIS — W19XXXA Unspecified fall, initial encounter: Secondary | ICD-10-CM | POA: Diagnosis not present

## 2021-06-07 DIAGNOSIS — I5022 Chronic systolic (congestive) heart failure: Secondary | ICD-10-CM | POA: Diagnosis not present

## 2021-06-07 DIAGNOSIS — S72002A Fracture of unspecified part of neck of left femur, initial encounter for closed fracture: Secondary | ICD-10-CM | POA: Diagnosis not present

## 2021-06-07 HISTORY — PX: HIP ARTHROPLASTY: SHX981

## 2021-06-07 LAB — SURGICAL PCR SCREEN
MRSA, PCR: NEGATIVE
Staphylococcus aureus: NEGATIVE

## 2021-06-07 LAB — CBC
HCT: 36 % (ref 36.0–46.0)
Hemoglobin: 11.5 g/dL — ABNORMAL LOW (ref 12.0–15.0)
MCH: 31.4 pg (ref 26.0–34.0)
MCHC: 31.9 g/dL (ref 30.0–36.0)
MCV: 98.4 fL (ref 80.0–100.0)
Platelets: 145 10*3/uL — ABNORMAL LOW (ref 150–400)
RBC: 3.66 MIL/uL — ABNORMAL LOW (ref 3.87–5.11)
RDW: 13.5 % (ref 11.5–15.5)
WBC: 7.9 10*3/uL (ref 4.0–10.5)
nRBC: 0 % (ref 0.0–0.2)

## 2021-06-07 LAB — GLUCOSE, CAPILLARY
Glucose-Capillary: 103 mg/dL — ABNORMAL HIGH (ref 70–99)
Glucose-Capillary: 126 mg/dL — ABNORMAL HIGH (ref 70–99)
Glucose-Capillary: 142 mg/dL — ABNORMAL HIGH (ref 70–99)

## 2021-06-07 SURGERY — HEMIARTHROPLASTY, HIP, DIRECT ANTERIOR APPROACH, FOR FRACTURE
Anesthesia: General | Site: Hip | Laterality: Left

## 2021-06-07 MED ORDER — FENTANYL CITRATE (PF) 100 MCG/2ML IJ SOLN
INTRAMUSCULAR | Status: DC | PRN
Start: 2021-06-07 — End: 2021-06-07
  Administered 2021-06-07 (×2): 25 ug via INTRAVENOUS
  Administered 2021-06-07: 50 ug via INTRAVENOUS

## 2021-06-07 MED ORDER — SODIUM CHLORIDE 0.9 % IV SOLN: INTRAVENOUS | Status: DC

## 2021-06-07 MED ORDER — ZOLPIDEM TARTRATE 5 MG PO TABS: 5.0000 mg | ORAL_TABLET | Freq: Every evening | ORAL | Status: DC | PRN

## 2021-06-07 MED ORDER — ACETAMINOPHEN 10 MG/ML IV SOLN
INTRAVENOUS | Status: AC
  Filled 2021-06-07: qty 100

## 2021-06-07 MED ORDER — HYDROMORPHONE HCL 1 MG/ML IJ SOLN: 0.2000 mg | INTRAMUSCULAR | Status: DC | PRN

## 2021-06-07 MED ORDER — ACETAMINOPHEN 10 MG/ML IV SOLN: 1000.0000 mg | Freq: Once | INTRAVENOUS | Status: DC | PRN

## 2021-06-07 MED ORDER — CEFAZOLIN SODIUM 1 G IJ SOLR
INTRAMUSCULAR | Status: AC
  Filled 2021-06-07: qty 20

## 2021-06-07 MED ORDER — ONDANSETRON HCL 4 MG PO TABS: 4.0000 mg | ORAL_TABLET | Freq: Four times a day (QID) | ORAL | Status: DC | PRN

## 2021-06-07 MED ORDER — FENTANYL CITRATE (PF) 100 MCG/2ML IJ SOLN
INTRAMUSCULAR | Status: AC
  Filled 2021-06-07: qty 2

## 2021-06-07 MED ORDER — DEXAMETHASONE SODIUM PHOSPHATE 10 MG/ML IJ SOLN
INTRAMUSCULAR | Status: DC | PRN
Start: 2021-06-07 — End: 2021-06-07
  Administered 2021-06-07: 5 mg via INTRAVENOUS

## 2021-06-07 MED ORDER — DEXAMETHASONE SODIUM PHOSPHATE 10 MG/ML IJ SOLN
INTRAMUSCULAR | Status: AC
  Filled 2021-06-07: qty 1

## 2021-06-07 MED ORDER — PHENYLEPHRINE HCL (PRESSORS) 10 MG/ML IV SOLN
INTRAVENOUS | Status: DC | PRN
  Administered 2021-06-07: 80 ug via INTRAVENOUS

## 2021-06-07 MED ORDER — ONDANSETRON HCL 4 MG/2ML IJ SOLN
INTRAMUSCULAR | Status: DC | PRN
Start: 2021-06-07 — End: 2021-06-07
  Administered 2021-06-07: 4 mg via INTRAVENOUS

## 2021-06-07 MED ORDER — FENTANYL CITRATE (PF) 100 MCG/2ML IJ SOLN: 25.0000 ug | INTRAMUSCULAR | Status: DC | PRN

## 2021-06-07 MED ORDER — OXYCODONE HCL 5 MG PO TABS: 5.0000 mg | ORAL_TABLET | Freq: Once | ORAL | Status: DC | PRN

## 2021-06-07 MED ORDER — TRAMADOL HCL 50 MG PO TABS: 50.0000 mg | ORAL_TABLET | Freq: Four times a day (QID) | ORAL | Status: DC | PRN

## 2021-06-07 MED ORDER — ROCURONIUM BROMIDE 10 MG/ML (PF) SYRINGE
PREFILLED_SYRINGE | INTRAVENOUS | Status: AC
Start: 2021-06-07 — End: ?
  Filled 2021-06-07: qty 10

## 2021-06-07 MED ORDER — SENNOSIDES-DOCUSATE SODIUM 8.6-50 MG PO TABS
1.0000 | ORAL_TABLET | Freq: Every evening | ORAL | Status: DC | PRN
  Administered 2021-06-11: 1 via ORAL
  Filled 2021-06-07: qty 1

## 2021-06-07 MED ORDER — METOCLOPRAMIDE HCL 5 MG/ML IJ SOLN: 5.0000 mg | Freq: Three times a day (TID) | INTRAMUSCULAR | Status: DC | PRN

## 2021-06-07 MED ORDER — CHLORHEXIDINE GLUCONATE CLOTH 2 % EX PADS
6.0000 | MEDICATED_PAD | Freq: Every day | CUTANEOUS | Status: DC
  Administered 2021-06-08 – 2021-06-13 (×5): 6 via TOPICAL

## 2021-06-07 MED ORDER — ONDANSETRON HCL 4 MG/2ML IJ SOLN
INTRAMUSCULAR | Status: AC
  Filled 2021-06-07: qty 2

## 2021-06-07 MED ORDER — BUPIVACAINE HCL (PF) 0.5 % IJ SOLN
INTRAMUSCULAR | Status: AC
  Filled 2021-06-07: qty 30

## 2021-06-07 MED ORDER — HEPARIN BOLUS VIA INFUSION
2000.0000 [IU] | Freq: Once | INTRAVENOUS | Status: AC
  Administered 2021-06-08: 2000 [IU] via INTRAVENOUS
  Filled 2021-06-07: qty 2000

## 2021-06-07 MED ORDER — BISACODYL 10 MG RE SUPP: 10.0000 mg | Freq: Every day | RECTAL | Status: DC | PRN

## 2021-06-07 MED ORDER — MIDAZOLAM HCL 2 MG/2ML IJ SOLN
INTRAMUSCULAR | Status: AC
Start: 2021-06-07 — End: ?
  Filled 2021-06-07: qty 2

## 2021-06-07 MED ORDER — ACETAMINOPHEN 500 MG PO TABS
1000.0000 mg | ORAL_TABLET | Freq: Three times a day (TID) | ORAL | Status: DC
  Administered 2021-06-07 – 2021-06-08 (×3): 1000 mg via ORAL
  Filled 2021-06-07 (×3): qty 2

## 2021-06-07 MED ORDER — EPHEDRINE SULFATE (PRESSORS) 50 MG/ML IJ SOLN
INTRAMUSCULAR | Status: DC | PRN
  Administered 2021-06-07: 5 mg via INTRAVENOUS

## 2021-06-07 MED ORDER — PROPOFOL 10 MG/ML IV BOLUS
INTRAVENOUS | Status: AC
Start: 2021-06-07 — End: ?
  Filled 2021-06-07: qty 20

## 2021-06-07 MED ORDER — OXYCODONE HCL 5 MG PO TABS: 10.0000 mg | ORAL_TABLET | ORAL | Status: DC | PRN

## 2021-06-07 MED ORDER — NEOMYCIN-POLYMYXIN B GU 40-200000 IR SOLN
Status: AC
  Filled 2021-06-07: qty 20

## 2021-06-07 MED ORDER — NOREPINEPHRINE 4 MG/250ML-% IV SOLN
INTRAVENOUS | Status: AC
  Filled 2021-06-07: qty 250

## 2021-06-07 MED ORDER — LIDOCAINE HCL (CARDIAC) PF 100 MG/5ML IV SOSY
PREFILLED_SYRINGE | INTRAVENOUS | Status: DC | PRN
  Administered 2021-06-07: 50 mg via INTRAVENOUS

## 2021-06-07 MED ORDER — OXYCODONE HCL 5 MG/5ML PO SOLN: 5.0000 mg | Freq: Once | ORAL | Status: DC | PRN

## 2021-06-07 MED ORDER — ROCURONIUM BROMIDE 100 MG/10ML IV SOLN
INTRAVENOUS | Status: DC | PRN
  Administered 2021-06-07: 40 mg via INTRAVENOUS

## 2021-06-07 MED ORDER — SODIUM CHLORIDE 0.9 % IV SOLN
Status: DC | PRN
  Administered 2021-06-07: 3012 mL

## 2021-06-07 MED ORDER — EPHEDRINE 5 MG/ML INJ
INTRAVENOUS | Status: AC
  Filled 2021-06-07: qty 5

## 2021-06-07 MED ORDER — OXYCODONE HCL 5 MG PO TABS
5.0000 mg | ORAL_TABLET | ORAL | Status: DC | PRN
  Administered 2021-06-07 – 2021-06-08 (×2): 10 mg via ORAL
  Filled 2021-06-07 (×2): qty 2

## 2021-06-07 MED ORDER — ONDANSETRON HCL 4 MG/2ML IJ SOLN: 4.0000 mg | Freq: Once | INTRAMUSCULAR | Status: AC | PRN

## 2021-06-07 MED ORDER — DOCUSATE SODIUM 100 MG PO CAPS
100.0000 mg | ORAL_CAPSULE | Freq: Two times a day (BID) | ORAL | Status: DC
  Administered 2021-06-09 – 2021-06-12 (×8): 100 mg via ORAL
  Filled 2021-06-07 (×10): qty 1

## 2021-06-07 MED ORDER — SODIUM CHLORIDE (PF) 0.9 % IJ SOLN
INTRAMUSCULAR | Status: AC
  Filled 2021-06-07: qty 50

## 2021-06-07 MED ORDER — ONDANSETRON HCL 4 MG/2ML IJ SOLN
4.0000 mg | Freq: Four times a day (QID) | INTRAMUSCULAR | Status: DC | PRN
  Administered 2021-06-08: 4 mg via INTRAVENOUS
  Filled 2021-06-07: qty 2

## 2021-06-07 MED ORDER — CEFAZOLIN SODIUM-DEXTROSE 2-3 GM-%(50ML) IV SOLR
INTRAVENOUS | Status: DC | PRN
  Administered 2021-06-07: 2 g via INTRAVENOUS

## 2021-06-07 MED ORDER — FLEET ENEMA 7-19 GM/118ML RE ENEM: 1.0000 | ENEMA | Freq: Once | RECTAL | Status: DC | PRN

## 2021-06-07 MED ORDER — SODIUM CHLORIDE 0.9 % IR SOLN
Status: DC | PRN
  Administered 2021-06-07: 1004 mL

## 2021-06-07 MED ORDER — HYDROMORPHONE HCL 2 MG PO TABS: 2.0000 mg | ORAL_TABLET | ORAL | Status: DC | PRN

## 2021-06-07 MED ORDER — PROPOFOL 10 MG/ML IV BOLUS
INTRAVENOUS | Status: DC | PRN
  Administered 2021-06-07: 40 mg via INTRAVENOUS

## 2021-06-07 MED ORDER — CEFAZOLIN SODIUM-DEXTROSE 2-4 GM/100ML-% IV SOLN
2.0000 g | Freq: Four times a day (QID) | INTRAVENOUS | Status: AC
  Administered 2021-06-07 (×2): 2 g via INTRAVENOUS
  Filled 2021-06-07 (×2): qty 100

## 2021-06-07 MED ORDER — MENTHOL 3 MG MT LOZG
1.0000 | LOZENGE | OROMUCOSAL | Status: DC | PRN
  Filled 2021-06-07: qty 9

## 2021-06-07 MED ORDER — BUPIVACAINE LIPOSOME 1.3 % IJ SUSP
INTRAMUSCULAR | Status: DC | PRN
  Administered 2021-06-07: 50 mL

## 2021-06-07 MED ORDER — METOCLOPRAMIDE HCL 5 MG PO TABS
5.0000 mg | ORAL_TABLET | Freq: Three times a day (TID) | ORAL | Status: DC | PRN
  Filled 2021-06-07: qty 2

## 2021-06-07 MED ORDER — ACETAMINOPHEN 10 MG/ML IV SOLN
INTRAVENOUS | Status: DC | PRN
  Administered 2021-06-07: 1000 mg via INTRAVENOUS

## 2021-06-07 MED ORDER — ONDANSETRON HCL 4 MG/2ML IJ SOLN
INTRAMUSCULAR | Status: AC
  Administered 2021-06-07: 4 mg via INTRAVENOUS
  Filled 2021-06-07: qty 2

## 2021-06-07 MED ORDER — LIDOCAINE HCL (PF) 2 % IJ SOLN
INTRAMUSCULAR | Status: AC
  Filled 2021-06-07: qty 5

## 2021-06-07 MED ORDER — MIDAZOLAM HCL 2 MG/2ML IJ SOLN
INTRAMUSCULAR | Status: DC | PRN
  Administered 2021-06-07: 1 mg via INTRAVENOUS

## 2021-06-07 MED ORDER — HEPARIN (PORCINE) 25000 UT/250ML-% IV SOLN
750.0000 [IU]/h | INTRAVENOUS | Status: DC
  Administered 2021-06-08: 750 [IU]/h via INTRAVENOUS
  Filled 2021-06-07 (×2): qty 250

## 2021-06-07 MED ORDER — HYDROMORPHONE HCL 2 MG PO TABS: 1.0000 mg | ORAL_TABLET | ORAL | Status: DC | PRN

## 2021-06-07 MED ORDER — PHENOL 1.4 % MT LIQD
1.0000 | OROMUCOSAL | Status: DC | PRN
  Filled 2021-06-07: qty 177

## 2021-06-07 MED ORDER — LIDOCAINE HCL (PF) 2 % IJ SOLN
INTRAMUSCULAR | Status: AC
Start: 2021-06-07 — End: ?
  Filled 2021-06-07: qty 5

## 2021-06-07 MED ORDER — SUGAMMADEX SODIUM 200 MG/2ML IV SOLN
INTRAVENOUS | Status: DC | PRN
  Administered 2021-06-07: 200 mg via INTRAVENOUS

## 2021-06-07 SURGICAL SUPPLY — 74 items
ADH SKN CLS APL DERMABOND .7 (GAUZE/BANDAGES/DRESSINGS)
APL PRP STRL LF DISP 70% ISPRP (MISCELLANEOUS) ×1
BLADE SAW SGTL 13X75X1.27 (BLADE) ×3 IMPLANT
BLADE SURG SZ10 CARB STEEL (BLADE) ×3 IMPLANT
BNDG COHESIVE 4X5 TAN ST LF (GAUZE/BANDAGES/DRESSINGS) ×3 IMPLANT
BOWL CEMENT MIXING ADV NOZZLE (MISCELLANEOUS) ×1 IMPLANT
CEMENT BONE 40GM (Cement) ×2 IMPLANT
CHLORAPREP W/TINT 26 (MISCELLANEOUS) ×3 IMPLANT
COVER BACK TABLE REUSABLE LG (DRAPES) ×3 IMPLANT
COVER MAYO STAND STRL (DRAPES) ×3 IMPLANT
DERMABOND ADVANCED (GAUZE/BANDAGES/DRESSINGS)
DERMABOND ADVANCED .7 DNX12 (GAUZE/BANDAGES/DRESSINGS) ×2 IMPLANT
DRAPE 3/4 80X56 (DRAPES) ×9 IMPLANT
DRAPE INCISE IOBAN 66X60 STRL (DRAPES) ×3 IMPLANT
DRAPE ORTHO SPLIT 77X108 STRL (DRAPES) ×2
DRAPE SURG 17X11 SM STRL (DRAPES) ×3 IMPLANT
DRAPE SURG ORHT 6 SPLT 77X108 (DRAPES) ×2 IMPLANT
DRAPE U-SHAPE 47X51 STRL (DRAPES) ×3 IMPLANT
DRSG OPSITE POSTOP 4X10 (GAUZE/BANDAGES/DRESSINGS) ×2 IMPLANT
DRSG OPSITE POSTOP 4X8 (GAUZE/BANDAGES/DRESSINGS) ×3 IMPLANT
ELECT CAUTERY BLADE 6.4 (BLADE) ×3 IMPLANT
ELECT REM PT RETURN 9FT ADLT (ELECTROSURGICAL) ×2
ELECTRODE REM PT RTRN 9FT ADLT (ELECTROSURGICAL) ×2 IMPLANT
GAUZE SPONGE 4X4 12PLY STRL (GAUZE/BANDAGES/DRESSINGS) ×3 IMPLANT
GAUZE XEROFORM 1X8 LF (GAUZE/BANDAGES/DRESSINGS) ×3 IMPLANT
GLOVE SRG 8 PF TXTR STRL LF DI (GLOVE) ×4 IMPLANT
GLOVE SURG ORTHO LTX SZ8 (GLOVE) ×6 IMPLANT
GLOVE SURG UNDER POLY LF SZ8 (GLOVE) ×4
GOWN STRL REUS W/ TWL LRG LVL3 (GOWN DISPOSABLE) ×2 IMPLANT
GOWN STRL REUS W/ TWL XL LVL3 (GOWN DISPOSABLE) ×2 IMPLANT
GOWN STRL REUS W/TWL LRG LVL3 (GOWN DISPOSABLE) ×2
GOWN STRL REUS W/TWL XL LVL3 (GOWN DISPOSABLE) ×2
HEAD MODULAR ENDO (Orthopedic Implant) ×2 IMPLANT
HEAD UNPLR 46XMDLR STRL HIP (Orthopedic Implant) IMPLANT
HEMOVAC 400ML (MISCELLANEOUS)
HOLSTER ELECTROSUGICAL PENCIL (MISCELLANEOUS) ×3 IMPLANT
IV NS IRRIG 3000ML ARTHROMATIC (IV SOLUTION) ×3 IMPLANT
KIT DRAIN HEMOVAC JP 7FR 400ML (MISCELLANEOUS) IMPLANT
KIT PREP HIP W/CEMENT RESTRICT (Miscellaneous) IMPLANT
KIT PREPARATION TOTAL HIP (Miscellaneous) ×2 IMPLANT
KIT TURNOVER KIT A (KITS) ×3 IMPLANT
MANIFOLD NEPTUNE II (INSTRUMENTS) ×6 IMPLANT
NDL MAYO CATGUT SZ4 TPR NDL (NEEDLE) ×2 IMPLANT
NDL SAFETY ECLIPSE 18X1.5 (NEEDLE) ×2 IMPLANT
NEEDLE HYPO 18GX1.5 SHARP (NEEDLE) ×2
NEEDLE HYPO 22GX1.5 SAFETY (NEEDLE) ×3 IMPLANT
NEEDLE MAYO CATGUT SZ4 (NEEDLE) ×2 IMPLANT
NS IRRIG 1000ML POUR BTL (IV SOLUTION) ×3 IMPLANT
PACK HIP PROSTHESIS (MISCELLANEOUS) ×3 IMPLANT
PENCIL SMOKE EVACUATOR (MISCELLANEOUS) ×3 IMPLANT
PILLOW ABDUC SM (MISCELLANEOUS) ×1 IMPLANT
PILLOW ABDUCTION FOAM SM (MISCELLANEOUS) ×3 IMPLANT
PULSAVAC PLUS IRRIG FAN TIP (DISPOSABLE) ×2
RETRIEVER SUT HEWSON (MISCELLANEOUS) IMPLANT
SLEEVE UNITRAX V40 (Orthopedic Implant) ×2 IMPLANT
SLEEVE UNITRAX V40 +8 (Orthopedic Implant) IMPLANT
SPACER OSTEO CEMENT (Spacer) ×2 IMPLANT
SPACER OSTEO CEMENT 15NT (Spacer) IMPLANT
STAPLER SKIN PROX 35W (STAPLE) ×3 IMPLANT
STEM FEM CEMT 49X158 SZ6 127D (Stem) ×1 IMPLANT
SUT ETHIBOND #5 BRAIDED 30INL (SUTURE) ×3 IMPLANT
SUT MNCRL 4-0 (SUTURE) ×2
SUT MNCRL 4-0 27XMFL (SUTURE) ×1
SUT VIC AB 0 CT1 36 (SUTURE) ×4 IMPLANT
SUT VIC AB 2-0 CT2 27 (SUTURE) ×6 IMPLANT
SUTURE MNCRL 4-0 27XMF (SUTURE) ×2 IMPLANT
SYR 20ML LL LF (SYRINGE) ×3 IMPLANT
SYR 50ML LL SCALE MARK (SYRINGE) ×3 IMPLANT
TAPE MICROFOAM 4IN (TAPE) ×2 IMPLANT
TAPE TRANSPORE STRL 2 31045 (GAUZE/BANDAGES/DRESSINGS) ×2 IMPLANT
TIP BRUSH PULSAVAC PLUS 24.33 (MISCELLANEOUS) ×3 IMPLANT
TIP FAN IRRIG PULSAVAC PLUS (DISPOSABLE) ×2 IMPLANT
TUBE KAMVAC SUCTION (TUBING) ×3 IMPLANT
TUBE SUCT KAM VAC (TUBING) ×3 IMPLANT

## 2021-06-07 NOTE — Progress Notes (Signed)
[place] at [facility] ? ? ?PATIENT NAME: [name]   ? ?MR#:  [id number] ? ?DATE OF BIRTH:  May 07, 1940 ? ?SUBJECTIVE:  ? ?husband at bedside. Patient came in after mechanical fall. She usually walks with walker. Started having left hip pain. Found to have left impacted femoral neck fracture. ? ?Patient just got back from surgery. Complains of pain. Had bladder scan done more than 500 mL of urine-- [address] Foley catheter ? ?VITALS:  ?Blood pressure 108/62, pulse 98, temperature 98 ?F (36.7 ?C), temperature source Oral, resp. rate 18, weight 68 kg, SpO2 94 %. ? ?PHYSICAL EXAMINATION:  ? ?GENERAL:  81 y.o.-year-old patient lying in the bed with no acute distress.  ?LUNGS: Normal breath sounds bilaterally, no wheezing, rales, rhonchi.  ?CARDIOVASCULAR: S1, S2 normal. No murmurs, rubs, or gallops.  ?ABDOMEN: Soft, nontender, nondistended. Bowel sounds present.  ?EXTREMITIES: left hip ROM+ ?NEUROLOGIC: nonfocal  patient is alert and awake ?SKIN: No obvious rash, lesion, or ulcer.  ? ?LABORATORY PANEL:  ?CBC ?Recent Labs  ?Lab 06/07/21 ?5188  ?WBC 7.9  ?HGB 11.5*  ?HCT 36.0  ?PLT 145*  ? ? ? ?Chemistries  ?Recent Labs  ?Lab 06/05/21 ?1706 06/06/21 ?0350  ?NA 140 139  ?K 4.3 4.4  ?CL 103 104  ?CO2 26 26  ?GLUCOSE 159* 127*  ?BUN 24* 21  ?CREATININE 1.47* 1.33*  ?CALCIUM 9.4 8.7*  ?AST 29  --   ?ALT 19  --   ?ALKPHOS 54  --   ?BILITOT 1.2  --   ? ? ?Cardiac Enzymes ?No results for input(s): TROPONINI in the last 168 hours. ?RADIOLOGY:  ?CT HEAD WO CONTRAST (5MM) ? ?Result Date: 06/05/2021 ?CLINICAL DATA:  Neck trauma (Age >= 65y); Head trauma, minor (Age >= 65y). Fall EXAM: CT HEAD WITHOUT CONTRAST CT CERVICAL SPINE WITHOUT CONTRAST TECHNIQUE: Multidetector CT imaging of the head and cervical spine was performed following the standard protocol without intravenous contrast. Multiplanar CT image reconstructions of the cervical spine were also generated. RADIATION DOSE REDUCTION: This exam was  performed according to the departmental dose-optimization program which includes automated exposure control, adjustment of the mA and/or kV according to patient size and/or use of iterative reconstruction technique. COMPARISON:  None. FINDINGS: CT HEAD FINDINGS Brain: Normal anatomic configuration. Parenchymal volume loss is commensurate with the patient's age. Moderate subcortical and periventricular white matter changes are present likely reflecting the sequela of small vessel ischemia. No abnormal intra or extra-axial mass lesion or fluid collection. No abnormal mass effect or midline shift. No evidence of acute intracranial hemorrhage or infarct. Ventricular size is normal. Cerebellum unremarkable. Vascular: No asymmetric hyperdense vasculature at the skull base. Skull: Intact Sinuses/Orbits: There is complete opacification of the left sphenoid sinus with sclerosis and thickening of the sinus walls in keeping with changes of chronic sinusitis in this location. Remaining paranasal sinuses are clear. Orbits are unremarkable. Other: Mastoid air cells and middle ear cavities are clear. CT CERVICAL SPINE FINDINGS Alignment: Normal. Skull base and vertebrae: Craniocervical alignment is normal. The atlantodental interval is not widened. Moderate degenerate arthritis at the atlantodental articulation. No acute fracture of the cervical spine. Vertebral body height is preserved. Soft tissues and spinal canal: No prevertebral fluid or swelling. No visible canal hematoma. Disc levels: There is intervertebral disc space narrowing and endplate remodeling at C1-Y6 in keeping with changes of moderate degenerative disc disease. The prevertebral soft tissues are not thickened on sagittal reformats. Spinal canal is widely patent. Review of  the axial images demonstrates none mild uncovertebral and facet arthrosis without significant neuroforaminal narrowing. Upper chest: Unremarkable Other: None IMPRESSION: No acute intracranial  abnormality.  No calvarial fracture. Chronic left sphenoid sinusitis. No acute fracture or listhesis of the cervical spine. Electronically Signed   By: Fidela Salisbury M.D.   On: 06/05/2021 19:24  ? ?CT Angio Chest PE W and/or Wo Contrast ? ?Result Date: 06/05/2021 ?CLINICAL DATA:  Pulmonary embolism (PE) suspected, high prob. Hypoxia. EXAM: CT ANGIOGRAPHY CHEST WITH CONTRAST TECHNIQUE: Multidetector CT imaging of the chest was performed using the standard protocol during bolus administration of intravenous contrast. Multiplanar CT image reconstructions and MIPs were obtained to evaluate the vascular anatomy. RADIATION DOSE REDUCTION: This exam was performed according to the departmental dose-optimization program which includes automated exposure control, adjustment of the mA and/or kV according to patient size and/or use of iterative reconstruction technique. CONTRAST:  26mL OMNIPAQUE IOHEXOL 350 MG/ML SOLN COMPARISON:  None. FINDINGS: Cardiovascular: Evaluation is moderately limited by motion artifact. The central pulmonary arteries are of normal caliber. No intraluminal filling defect identified to suggest acute pulmonary embolism. Moderate coronary artery calcification. Mild cardiomegaly with left ventricular dilation noted. Small pericardial effusion without CT evidence of cardiac tamponade. Moderate atherosclerotic calcification within the thoracic aorta. No aortic aneurysm. Mediastinum/Nodes: Visualized thyroid is unremarkable. No pathologic thoracic adenopathy. Esophagus is unremarkable. Lungs/Pleura: Lungs are clear. No pleural effusion or pneumothorax. Upper Abdomen: No acute abnormality. Musculoskeletal: The osseous structures diffusely osteopenic. There are remote appearing compression deformities of T5, T6, and T7, new at T5 and 6 since prior chest radiograph of 04/17/2017. Mild compression deformity of T9 and severe compression deformity at T10 status post vertebroplasty are unchanged. Multiple healed  right rib fractures are identified. No acute bone abnormality. Review of the MIP images confirms the above findings. IMPRESSION: No acute pulmonary embolism. Moderate coronary artery calcification. Mild cardiomegaly with moderate left ventricular dilation. Osteopenia with multiple thoracic compression fractures, new at T5 and T6 since prior chest radiograph of 04/17/2017, but remote in appearance. Aortic Atherosclerosis (ICD10-I70.0). Electronically Signed   By: Fidela Salisbury M.D.   On: 06/05/2021 19:15  ? ?CT Cervical Spine Wo Contrast ? ?Result Date: 06/05/2021 ?CLINICAL DATA:  Neck trauma (Age >= 65y); Head trauma, minor (Age >= 65y). Fall EXAM: CT HEAD WITHOUT CONTRAST CT CERVICAL SPINE WITHOUT CONTRAST TECHNIQUE: Multidetector CT imaging of the head and cervical spine was performed following the standard protocol without intravenous contrast. Multiplanar CT image reconstructions of the cervical spine were also generated. RADIATION DOSE REDUCTION: This exam was performed according to the departmental dose-optimization program which includes automated exposure control, adjustment of the mA and/or kV according to patient size and/or use of iterative reconstruction technique. COMPARISON:  None. FINDINGS: CT HEAD FINDINGS Brain: Normal anatomic configuration. Parenchymal volume loss is commensurate with the patient's age. Moderate subcortical and periventricular white matter changes are present likely reflecting the sequela of small vessel ischemia. No abnormal intra or extra-axial mass lesion or fluid collection. No abnormal mass effect or midline shift. No evidence of acute intracranial hemorrhage or infarct. Ventricular size is normal. Cerebellum unremarkable. Vascular: No asymmetric hyperdense vasculature at the skull base. Skull: Intact Sinuses/Orbits: There is complete opacification of the left sphenoid sinus with sclerosis and thickening of the sinus walls in keeping with changes of chronic sinusitis in this  location. Remaining paranasal sinuses are clear. Orbits are unremarkable. Other: Mastoid air cells and middle ear cavities are clear. CT CERVICAL SPINE FINDINGS Alignment: Normal. Skull base and  vertebrae: Cr

## 2021-06-07 NOTE — Progress Notes (Signed)
ANTICOAGULATION CONSULT NOTE ? ?Pharmacy Consult for Heparin Infusion ?Indication: ACS/STEMI ? ?Patient Measurements: ?Weight: 68 kg (150 lb) ?Heparin Dosing Weight: 60.2 kg ? ?Labs: ?Recent Labs  ?  06/05/21 ?1706 06/05/21 ?1922 06/06/21 ?0350 06/06/21 ?1132 06/06/21 ?1321 06/06/21 ?2005 06/07/21 ?0437  ?HGB 13.4  --  12.0  --   --   --  11.5*  ?HCT 41.3  --  38.0  --   --   --  36.0  ?PLT 177  --  180  --   --   --  145*  ?APTT 31  --   --   --   --   --   --   ?LABPROT 13.0  --   --   --   --   --   --   ?INR 1.0  --   --   --   --   --   --   ?HEPARINUNFRC  --   --   --  0.59  --  0.36  --   ?CREATININE 1.47*  --  1.33*  --   --   --   --   ?CKTOTAL 68  --   --   --   --   --   --   ?TROPONINIHS 22*   < > 1,777* 1,935* 1,888*  --   --   ? < > = values in this interval not displayed.  ? ? ? ?Estimated Creatinine Clearance: 29.5 mL/min (A) (by C-G formula based on SCr of 1.33 mg/dL (H)). ? ? ?Medical History: ?Past Medical History:  ?Diagnosis Date  ? Anxiety   ? Arthritis   ? CAD (coronary artery disease)   ? a. cardiac cath 10/2014: ostial LAD 30%, prox LAD 90% s/p PCI/DES, ostial RCA 30%, mid RCA 90% s/p PCI/DES, ostial Ramus 50%  ? Chronic back pain   ? Chronic fatigue   ? Chronic systolic CHF (congestive heart failure) ([place])   ? a. 10/2014 Echo: EF 20%; b. 06/2017 Echo: EF 20-25%, diff HK, Gr2 DD, Mild AI. Mod to sev MR. Mod dil LA.  ? Hepatitis C   ? Ischemic cardiomyopathy   ? a. 10/2014 Echo: EF 20%; b. 06/2017 Echo: EF 20-25%.  ? LBBB (left bundle branch block)   ? Moderate to Severe Mitral regurgitation   ? a. 06/2017 Echo: Mod-Sev MR.  ? Osteoporosis   ? UTI (lower urinary tract infection)   ? ? ?Assessment: ?Pt is 81 yo female presenting to ED after falling w/ closed left hip fracture, also found with elevated troponin I, trending up. ?Plan to continue heparin x 48 hours per cardiology ?Patient is scheduled for L hip hemiarthroplasty 4/6 at 1030 am.  ? ?Goal of Therapy:  ?Heparin level 0.3-0.7  units/ml ?Monitor platelets by anticoagulation protocol: Yes ?  ?Plan:  ?--Will resume Heparin drip 4/7 at 05:00 ?--Will give a half bolus of Heparin 2000 units followed by heparin infusion at 750 units/hr ?--Next heparin level 8 hours after restart ?--Daily CBC per protocol while on IV heparin ?--Plan to continue heparin x 48 hours per cardiology ? ?Paulina Fusi, PharmD, BCPS ?06/07/2021 ?3:28 PM ? ? ? ? ?

## 2021-06-07 NOTE — Op Note (Signed)
DATE OF SURGERY: 06/07/2021 ? ?PREOPERATIVE DIAGNOSIS: Left femoral neck fracture ? ?POSTOPERATIVE DIAGNOSIS: Left femoral neck fracture ? ?PROCEDURE: Left hip hemiarthroplasty ? ?SURGEON: Cato Mulligan, MD ? ?ASSISTANTS: Turner Daniels, PA-S  ? ?EBL: 200 cc ? ?COMPONENTS:  ?[employer] - Accolade C Size 6 Stem ?[employer] - Unitrax 7mm head with +8 offset neck ? ? ?INDICATIONS: ?BARBEE MAMULA is a 81 y.o. female who sustained a displaced femoral neck fracture after a fall. Risks and benefits of hip hemiarthroplasty were explained to the patient and/or family. Risks include but are not limited to bleeding, infection, injury to tissues, nerves, vessels, periprosthetic infection, dislocation, limb length discrepancy and risks of anesthesia. The patient and/or family understands these risks, has completed an informed consent and wishes to proceed. ?  ?PROCEDURE:  ?The patient was identified in the preoperative holding area and the operative extremity was marked.  The patient was then transferred to the operating room suite and mobilized from the hospital gurney to the operating room table. General anesthesia was administered without complication. The patient was then transitioned to a lateral position.  All bony prominences were padded per protocol.  An axillary roll was placed.  Careful attention was paid to the contralateral side peroneal nerve, which was free from pressure with use of appropriate padding and blankets. A time-out was performed to confirm the patient's identity and the correct laterality of surgery. The patient was then prepped and draped in the usual sterile fashion. Appropriate pre-operative antibiotics were administered.  ?  ?An incision that centered on the posterior tip of the greater trochanter with a posterior curve was made. Dissection was carried down through the subcutaneous tissue.  Careful attention was made to maintain hemostasis using electrocautery.  Dissection brought [place] to the level of the  deep fascia where the gluteus maximus muscle and proximal portion of the IT band were identified.  The proximal region of the IT band was incised in linear fashion and this incision was extended proximally in a curvilinear fashion to split the gluteus maximus muscle parallel to its fibers to minimize bleeding.  This was accomplished using a combination bovie electrocautery as well as blunt dissection.  The trochanteric bursa was then visualized and dissected from anterior to posterior. A blunt homan retractor was placed beneath the abductors. The piriformis tendon and short external rotators were visualized. Bovie electrocautery was used to cut these with the capsule as one L-shaped flap. This was tagged at the corner with #5 Ethibond. At this point, the femoral neck fracture was visualized. An oscillating saw was used to make a new neck cut approximately 65mm above the lesser tuberosity with the use of a neck cut guide.  However, there was further comminution of the fracture with bony fragments at the level of the neck cut.  On removing the fragments, the calcar proximal to the lesser trochanter was essentially nonexistent.  The head was then freed from its remaining soft tissue attachments and measured. The head trial was then inserted into the acetabulum and sized appropriately.  ?  ?We then turned our attention to preparing the femoral canal. First, a box cut was performed utilizing the box osteotome. A canal finder was inserted by hand and sequential broaching was then performed. The calcar planer was inserted onto the broach and used to smooth the calcar appropriately.  A trial stem, neutral neck, and head were inserted into the acetabulum and placed through range of motion. Intraoperative radiographs were taken to assess hardware position and leg  lengths. The hip was again dislocated and the femoral trial components were removed. An appropriately sized cement restrictor was placed. The canal was irrigated  with pulse lavage and dried. A cement gun was used to place cement into the femoral canal. Cement was then pressurized. The actual stem was inserted into the femoral canal and then driven onto the calcar. Position was held until the cement hardened. The trial head was then again inserted on the femoral component and found to be appropriate. Trial was removed and the permanent head/neck was Morse tapered onto the femoral stem and then reduced into the acetabulum.  Intraoperative radiographs were utilized to assess leg length and stem position both before and after stem placement ?  ?The hip stability and length were reassessed intraoperatively and found to be satisfactory.  The wound was then copiously irrigated with normal saline solution. The tagged sutures of the capsule and piriformis were sewn to the gluteus medius tendon. This adequately closed the hip capsule. The IT band and gluteus maximus fascia were then closed with 0-Vicryl in a running, locked fashion. A mixture of Exparil and Sensoricaine was administered.  Dead space was closed with 0 Vicryl.  The subdermal layer was closed with 2-0 Vicryl in a buried interrupted fashion. Skin was approximated with staples.  Sterile dressing was applied. An abduction pillow was placed. The patient was mobilized from the lateral position back to supine on the operating room table and then awakened from general anesthesia without complication. ? ? ?POSTOPERATIVE PLAN: ?The patient will be WBAT on operative extremity.  Can resume heparin drip and recommend anticoagulation at least at a prophylactic dose for 4 weeks.. IV Abx x 24 hours. PT/OT on POD#1. Posterior hip precautions.  ? ?

## 2021-06-07 NOTE — Anesthesia Postprocedure Evaluation (Signed)
Anesthesia Post Note ? ?Patient: ANNY SAYLER ? ?Procedure(s) Performed: ARTHROPLASTY BIPOLAR HIP (HEMIARTHROPLASTY) (Left: Hip) ? ?Patient location during evaluation: PACU ?Anesthesia Type: General ?Level of consciousness: awake and alert ?Pain management: pain level controlled ?Vital Signs Assessment: post-procedure vital signs reviewed and stable ?Respiratory status: spontaneous breathing, nonlabored ventilation, respiratory function stable and patient connected to nasal cannula oxygen ?Cardiovascular status: blood pressure returned to baseline and stable ?Postop Assessment: no apparent nausea or vomiting ?Anesthetic complications: no ? ? ?No notable events documented. ? ? ?Last Vitals:  ?Vitals:  ? 06/07/21 1425 06/07/21 1430  ?BP:  (!) 112/56  ?Pulse: 100 98  ?Resp: (!) 22 18  ?Temp:    ?SpO2: 93% 92%  ?  ?Last Pain:  ?Vitals:  ? 06/07/21 1405  ?TempSrc:   ?PainSc: 0-No pain  ? ? ?  ?  ?  ?  ?  ?  ? ?Arita Miss ? ? ? ? ?

## 2021-06-07 NOTE — Anesthesia Preprocedure Evaluation (Signed)
Anesthesia Evaluation  ?Patient identified by MRN, date of birth, ID band ?Patient awake ? ?General Assessment Comment: ? ?Presented after mechanical fall. On lab work, found to have elevated troponins, with no ACS symptoms, no EKG changes. Cardiology not planning on catheterization or intervention, however she was put on a 3 day heparin drip protocol. ? ?On my exam, patient's SpO2 is 70% on room air; patient asymptomatic. On forceful deep breathing, goes up to 92% room air. With 3L/min  goes up to 94%. Patient is not on home O2. No evidence of fluid overload, I suspect likely splinting from pain and atelectasis, especially with quick rise of SpO2 with deep breathing. ? ?Reviewed: ?Allergy & Precautions, NPO status , Patient's Chart, lab work & pertinent test results ? ?History of Anesthesia Complications ?Negative for: history of anesthetic complications ? ?Airway ?Mallampati: III ? ?TM Distance: >3 FB ?Neck ROM: Full ? ? ? Dental ? ?(+) Poor Dentition, Chipped ?  ?Pulmonary ?neg sleep apnea, COPD, Patient abstained from smoking.Not current smoker, former smoker,  ?  ? ?+ decreased breath sounds(-) wheezing ? ?(-) rales ? ? ? Cardiovascular ?Exercise Tolerance: Poor ?METShypertension, + CAD, + Past MI and +CHF  ?+ dysrhythmias + Valvular Problems/Murmurs MR  ?Rhythm:Regular Rate:Normal ?- Systolic murmurs ?TTE 06/06/2021: ??1. Left ventricular ejection fraction, by estimation, is 25 to 30%. The  ?left ventricle has severely decreased function. The left ventricle  ?demonstrates global hypokinesis. The left ventricular internal cavity size  ?was mildly dilated. There is moderate  ?asymmetric left ventricular hypertrophy of the septal segment. Left  ?ventricular diastolic parameters are consistent with Grade II diastolic  ?dysfunction (pseudonormalization).  ??2. Right ventricular systolic function is low normal. The right  ?ventricular size is mildly enlarged.  ??3. Left atrial size  was moderately dilated.  ??4. The mitral valve is normal in structure. Moderate mitral valve  ?regurgitation.  ??5. The aortic valve is tricuspid. Aortic valve regurgitation is trivial.  ?  ?Neuro/Psych ?PSYCHIATRIC DISORDERS Anxiety negative neurological ROS ?   ? GI/Hepatic ?neg GERD  ,(+)  ?  ? (-) substance abuse ? , Hepatitis -  ?Endo/Other  ?neg diabetes ? Renal/GU ?CRFRenal disease  ? ?  ?Musculoskeletal ? ? Abdominal ?  ?Peds ? Hematology ?  ?Anesthesia Other Findings ?Past Medical History: ?No date: Anxiety ?No date: Arthritis ?No date: CAD (coronary artery disease) ?    Comment:  a. cardiac cath 10/2014: ostial LAD 30%, prox LAD 90% s/p ?             PCI/DES, ostial RCA 30%, mid RCA 90% s/p PCI/DES, ostial  ?             Ramus 50% ?No date: Chronic back pain ?No date: Chronic fatigue ?No date: Chronic systolic CHF (congestive heart failure) ([place]) ?    Comment:  a. 10/2014 Echo: EF 20%; b. 06/2017 Echo: EF 20-25%, diff  ?             HK, Gr2 DD, Mild AI. Mod to sev MR. Mod dil LA. ?No date: Hepatitis C ?No date: Ischemic cardiomyopathy ?    Comment:  a. 10/2014 Echo: EF 20%; b. 06/2017 Echo: EF 20-25%. ?No date: LBBB (left bundle branch block) ?No date: Moderate to Severe Mitral regurgitation ?    Comment:  a. 06/2017 Echo: Mod-Sev MR. ?No date: Osteoporosis ?No date: UTI (lower urinary tract infection) ? Reproductive/Obstetrics ? ?  ? ? ? ? ? ? ? ? ? ? ? ? ? ?  ?  ? ? ? ? ? ? ? ? ?  Anesthesia Physical ?Anesthesia Plan ? ?ASA: 4 ? ?Anesthesia Plan: General  ? ?Post-op Pain Management: Ofirmev IV (intra-op)*  ? ?Induction: Intravenous ? ?PONV Risk Score and Plan: 4 or greater and Ondansetron, Dexamethasone, Midazolam and Treatment may vary due to age or medical condition ? ?Airway Management Planned: Oral ETT ? ?Additional Equipment: Arterial line ? ?Intra-op Plan:  ? ?Post-operative Plan: Possible Post-op intubation/ventilation ? ?Informed Consent: I have reviewed the patients History and Physical, chart, labs and  discussed the procedure including the risks, benefits and alternatives for the proposed anesthesia with the patient or authorized representative who has indicated his/her understanding and acceptance.  ? ? ? ?Dental advisory given ? ?Plan Discussed with: CRNA and Surgeon ? ?Anesthesia Plan Comments: (Discussed risks of anesthesia with patient, including PONV, sore throat, lip/dental/eye damage. Rare risks discussed as well, such as cardiorespiratory and neurological sequelae, and allergic reactions. Discussed possibility of prolonged intubation. Discussed the role of CRNA in patient's perioperative care. Patient understands. ?Patient counseled on being higher risk for anesthesia due to comorbidities: active cardiac disease, hypoxia, . Patient was told about increased risk of cardiac and respiratory events, including death. ?)  ? ? ? ? ? ? ?Anesthesia Quick Evaluation ? ?

## 2021-06-07 NOTE — Transfer of Care (Addendum)
Immediate Anesthesia Transfer of Care Note ? ?Patient: [name] ? ?Procedure(s) Performed: ARTHROPLASTY BIPOLAR HIP (HEMIARTHROPLASTY) (Left: Hip) ? ?Patient Location: PACU ? ?Anesthesia Type:General ? ?Level of Consciousness: drowsy ? ?Airway & Oxygen Therapy: Patient Spontanous Breathing and Patient connected to face mask oxygen ? ?Post-op Assessment: Report given to RN and Post -op Vital signs reviewed and stable ? ?Post vital signs: Reviewed ? ?Last Vitals:  ?Vitals Value Taken Time  ?BP 124/44 06/07/21 1305  ?Temp    ?Pulse 95 06/07/21 1306  ?Resp 21 06/07/21 1306  ?SpO2 98 % 06/07/21 1306  ?Vitals shown include unvalidated device data. ? ?Last Pain:  ?   ? ?Patients Stated Pain Goal: 2 (06/07/21 0703) ? ?Complications: No notable events documented. ?

## 2021-06-07 NOTE — H&P (Signed)
H&P reviewed. No significant changes noted. Cleared by cardiology to proceed today. ? ?

## 2021-06-07 NOTE — Anesthesia Procedure Notes (Signed)
Procedure Name: Intubation ?Date/Time: 06/07/2021 11:09 AM ?Performed by: Rolla Plate, CRNA ?Pre-anesthesia Checklist: Patient identified, Patient being monitored, Timeout performed, Emergency Drugs available and Suction available ?Patient Re-evaluated:Patient Re-evaluated prior to induction ?Oxygen Delivery Method: Circle system utilized ?Preoxygenation: Pre-oxygenation with 100% oxygen ?Induction Type: IV induction ?Ventilation: Mask ventilation without difficulty ?Laryngoscope Size: 3 and McGraph ?Grade View: Grade I ?Tube type: Oral ?Tube size: 6.5 mm ?Number of attempts: 1 ?Airway Equipment and Method: Stylet and Video-laryngoscopy ?Placement Confirmation: ETT inserted through vocal cords under direct vision, positive ETCO2 and breath sounds checked- equal and bilateral ?Secured at: 21 cm ?Tube secured with: Tape ?Dental Injury: Teeth and Oropharynx as per pre-operative assessment  ? ? ? ? ?

## 2021-06-07 NOTE — Anesthesia Procedure Notes (Signed)
Arterial Line Insertion ?Performed by: Arita Miss, MD, anesthesiologist ? Patient location: Pre-op. ?Preanesthetic checklist: patient identified, IV checked, site marked, risks and benefits discussed, surgical consent, monitors and equipment checked, pre-op evaluation, timeout performed and anesthesia consent ?Lidocaine 1% used for infiltration and patient sedated ?Left, radial was placed ?Catheter size: 20 G ?Hand hygiene performed  and maximum sterile barriers used  ? ?Attempts: 1 ?Procedure performed without using ultrasound guided technique. ?Following insertion, dressing applied. ?Post procedure assessment: normal and unchanged ? ?Patient tolerated the procedure well with no immediate complications. ? ? ?

## 2021-06-07 NOTE — Progress Notes (Signed)
PT Cancellation Note ? ?Patient Details ?Name: [name] ?MRN: [id number] ?DOB: [birth year] ? ? ?Cancelled Treatment:    Reason Eval/Treat Not Completed: Other (comment). Consult received and chart reviewed. Per MD, will start PT/OT on POD 1. Will begin therapy next date. ? ? ?Wilberto Console ?06/07/2021, 2:59 PM ?Greggory Stallion, PT, DPT, GCS ?[phone] ? ?

## 2021-06-08 ENCOUNTER — Inpatient Hospital Stay: Payer: Medicare HMO

## 2021-06-08 DIAGNOSIS — S72002A Fracture of unspecified part of neck of left femur, initial encounter for closed fracture: Secondary | ICD-10-CM | POA: Diagnosis not present

## 2021-06-08 DIAGNOSIS — I5022 Chronic systolic (congestive) heart failure: Secondary | ICD-10-CM | POA: Diagnosis not present

## 2021-06-08 DIAGNOSIS — I10 Essential (primary) hypertension: Secondary | ICD-10-CM | POA: Diagnosis not present

## 2021-06-08 DIAGNOSIS — W19XXXA Unspecified fall, initial encounter: Secondary | ICD-10-CM | POA: Diagnosis not present

## 2021-06-08 LAB — COMPREHENSIVE METABOLIC PANEL
ALT: 78 U/L — ABNORMAL HIGH (ref 0–44)
AST: 137 U/L — ABNORMAL HIGH (ref 15–41)
Albumin: 2.8 g/dL — ABNORMAL LOW (ref 3.5–5.0)
Alkaline Phosphatase: 49 U/L (ref 38–126)
Anion gap: 9 (ref 5–15)
BUN: 32 mg/dL — ABNORMAL HIGH (ref 8–23)
CO2: 20 mmol/L — ABNORMAL LOW (ref 22–32)
Calcium: 6.8 mg/dL — ABNORMAL LOW (ref 8.9–10.3)
Chloride: 105 mmol/L (ref 98–111)
Creatinine, Ser: 1.5 mg/dL — ABNORMAL HIGH (ref 0.44–1.00)
GFR, Estimated: 35 mL/min — ABNORMAL LOW (ref >60)
Glucose, Bld: 163 mg/dL — ABNORMAL HIGH (ref 70–99)
Potassium: 3.8 mmol/L (ref 3.5–5.1)
Sodium: 134 mmol/L — ABNORMAL LOW (ref 135–145)
Total Bilirubin: 0.6 mg/dL (ref 0.3–1.2)
Total Protein: 5.7 g/dL — ABNORMAL LOW (ref 6.5–8.1)

## 2021-06-08 LAB — CBC WITH DIFFERENTIAL/PLATELET
Abs Immature Granulocytes: 0.1 10*3/uL — ABNORMAL HIGH (ref 0.00–0.07)
Basophils Absolute: 0 10*3/uL (ref 0.0–0.1)
Basophils Relative: 0 %
Eosinophils Absolute: 0 10*3/uL (ref 0.0–0.5)
Eosinophils Relative: 0 %
HCT: 28.6 % — ABNORMAL LOW (ref 36.0–46.0)
Hemoglobin: 9.1 g/dL — ABNORMAL LOW (ref 12.0–15.0)
Immature Granulocytes: 1 %
Lymphocytes Relative: 8 %
Lymphs Abs: 0.9 10*3/uL (ref 0.7–4.0)
MCH: 31.3 pg (ref 26.0–34.0)
MCHC: 31.8 g/dL (ref 30.0–36.0)
MCV: 98.3 fL (ref 80.0–100.0)
Monocytes Absolute: 0.7 10*3/uL (ref 0.1–1.0)
Monocytes Relative: 6 %
Neutro Abs: 9.6 10*3/uL — ABNORMAL HIGH (ref 1.7–7.7)
Neutrophils Relative %: 85 %
Platelets: 169 10*3/uL (ref 150–400)
RBC: 2.91 MIL/uL — ABNORMAL LOW (ref 3.87–5.11)
RDW: 13.8 % (ref 11.5–15.5)
WBC: 11.3 10*3/uL — ABNORMAL HIGH (ref 4.0–10.5)
nRBC: 0 % (ref 0.0–0.2)

## 2021-06-08 LAB — CBC
HCT: 32.5 % — ABNORMAL LOW (ref 36.0–46.0)
Hemoglobin: 10.6 g/dL — ABNORMAL LOW (ref 12.0–15.0)
MCH: 32.2 pg (ref 26.0–34.0)
MCHC: 32.6 g/dL (ref 30.0–36.0)
MCV: 98.8 fL (ref 80.0–100.0)
Platelets: 168 10*3/uL (ref 150–400)
RBC: 3.29 MIL/uL — ABNORMAL LOW (ref 3.87–5.11)
RDW: 13.2 % (ref 11.5–15.5)
WBC: 10.1 10*3/uL (ref 4.0–10.5)
nRBC: 0 % (ref 0.0–0.2)

## 2021-06-08 LAB — TROPONIN I (HIGH SENSITIVITY): Troponin I (High Sensitivity): 1177 ng/L (ref <18)

## 2021-06-08 LAB — BASIC METABOLIC PANEL
Anion gap: 13 (ref 5–15)
BUN: 22 mg/dL (ref 8–23)
CO2: 21 mmol/L — ABNORMAL LOW (ref 22–32)
Calcium: 7.3 mg/dL — ABNORMAL LOW (ref 8.9–10.3)
Chloride: 103 mmol/L (ref 98–111)
Creatinine, Ser: 1.15 mg/dL — ABNORMAL HIGH (ref 0.44–1.00)
GFR, Estimated: 48 mL/min — ABNORMAL LOW (ref >60)
Glucose, Bld: 141 mg/dL — ABNORMAL HIGH (ref 70–99)
Potassium: 3.9 mmol/L (ref 3.5–5.1)
Sodium: 137 mmol/L (ref 135–145)

## 2021-06-08 LAB — MAGNESIUM: Magnesium: 2.4 mg/dL (ref 1.7–2.4)

## 2021-06-08 LAB — GLUCOSE, CAPILLARY: Glucose-Capillary: 155 mg/dL — ABNORMAL HIGH (ref 70–99)

## 2021-06-08 LAB — HEPARIN LEVEL (UNFRACTIONATED): Heparin Unfractionated: 0.43 IU/mL (ref 0.30–0.70)

## 2021-06-08 MED ORDER — TORSEMIDE 20 MG PO TABS: 20.0000 mg | ORAL_TABLET | ORAL | Status: DC

## 2021-06-08 MED ORDER — ENOXAPARIN SODIUM 40 MG/0.4ML IJ SOSY
40.0000 mg | PREFILLED_SYRINGE | INTRAMUSCULAR | Status: DC
  Administered 2021-06-09 – 2021-06-12 (×5): 40 mg via SUBCUTANEOUS
  Filled 2021-06-08 (×5): qty 0.4

## 2021-06-08 MED ORDER — OXYCODONE HCL 5 MG PO TABS: 5.0000 mg | ORAL_TABLET | ORAL | Status: DC | PRN

## 2021-06-08 MED ORDER — MIDODRINE HCL 5 MG PO TABS
5.0000 mg | ORAL_TABLET | Freq: Once | ORAL | Status: DC
  Filled 2021-06-08: qty 1

## 2021-06-08 MED ORDER — NALOXONE HCL 0.4 MG/ML IJ SOLN
0.4000 mg | Freq: Once | INTRAMUSCULAR | Status: AC
  Administered 2021-06-08: 0.4 mg via INTRAVENOUS
  Filled 2021-06-08: qty 1

## 2021-06-08 MED ORDER — HYDROMORPHONE HCL 2 MG PO TABS: 1.0000 mg | ORAL_TABLET | ORAL | Status: DC | PRN

## 2021-06-08 MED ORDER — TRAMADOL HCL 50 MG PO TABS: 50.0000 mg | ORAL_TABLET | Freq: Four times a day (QID) | ORAL | 0 refills | Status: DC | PRN

## 2021-06-08 MED ORDER — OXYCODONE HCL 5 MG PO TABS: 5.0000 mg | ORAL_TABLET | ORAL | 0 refills | Status: DC | PRN

## 2021-06-08 MED ORDER — ALBUMIN HUMAN 25 % IV SOLN
25.0000 g | Freq: Once | INTRAVENOUS | Status: AC
  Administered 2021-06-09: 25 g via INTRAVENOUS
  Filled 2021-06-08: qty 100

## 2021-06-08 MED ORDER — SODIUM CHLORIDE 0.9 % IV BOLUS
250.0000 mL | Freq: Once | INTRAVENOUS | Status: AC
  Administered 2021-06-08: 250 mL via INTRAVENOUS

## 2021-06-08 MED ORDER — LACTATED RINGERS IV BOLUS: 500.0000 mL | Freq: Once | INTRAVENOUS | Status: DC

## 2021-06-08 MED ORDER — CALCIUM GLUCONATE-NACL 1-0.675 GM/50ML-% IV SOLN
1.0000 g | Freq: Once | INTRAVENOUS | Status: AC
  Administered 2021-06-09: 1000 mg via INTRAVENOUS
  Filled 2021-06-08: qty 50

## 2021-06-08 NOTE — Progress Notes (Signed)
ANTICOAGULATION CONSULT NOTE ? ?Pharmacy Consult for Heparin Infusion ?Indication: ACS/STEMI ? ?Patient Measurements: ?Weight: 68 kg (150 lb) ?Heparin Dosing Weight: 60.2 kg ? ?Labs: ?Recent Labs  ?  06/05/21 ?1706 06/05/21 ?1922 06/06/21 ?0350 06/06/21 ?1132 06/06/21 ?1321 06/06/21 ?2005 06/07/21 ?0437 06/08/21 ?0344 06/08/21 ?1355  ?HGB 13.4  --  12.0  --   --   --  11.5* 10.6*  --   ?HCT 41.3  --  38.0  --   --   --  36.0 32.5*  --   ?PLT 177  --  180  --   --   --  145* 168  --   ?APTT 31  --   --   --   --   --   --   --   --   ?LABPROT 13.0  --   --   --   --   --   --   --   --   ?INR 1.0  --   --   --   --   --   --   --   --   ?HEPARINUNFRC  --   --   --  0.59  --  0.36  --   --  0.43  ?CREATININE 1.47*  --  1.33*  --   --   --   --  1.15*  --   ?CKTOTAL 68  --   --   --   --   --   --   --   --   ?TROPONINIHS 22*   < > 1,777* 1,935* 1,888*  --   --   --   --   ? < > = values in this interval not displayed.  ? ? ? ?Estimated Creatinine Clearance: 34.1 mL/min (A) (by C-G formula based on SCr of 1.15 mg/dL (H)). ? ? ?Medical History: ?Past Medical History:  ?Diagnosis Date  ? Anxiety   ? Arthritis   ? CAD (coronary artery disease)   ? a. cardiac cath 10/2014: ostial LAD 30%, prox LAD 90% s/p PCI/DES, ostial RCA 30%, mid RCA 90% s/p PCI/DES, ostial Ramus 50%  ? Chronic back pain   ? Chronic fatigue   ? Chronic systolic CHF (congestive heart failure) ([place])   ? a. 10/2014 Echo: EF 20%; b. 06/2017 Echo: EF 20-25%, diff HK, Gr2 DD, Mild AI. Mod to sev MR. Mod dil LA.  ? Hepatitis C   ? Ischemic cardiomyopathy   ? a. 10/2014 Echo: EF 20%; b. 06/2017 Echo: EF 20-25%.  ? LBBB (left bundle branch block)   ? Moderate to Severe Mitral regurgitation   ? a. 06/2017 Echo: Mod-Sev MR.  ? Osteoporosis   ? UTI (lower urinary tract infection)   ? ? ?Assessment: ?Pt is 81 yo female presenting to ED after falling w/ closed left hip fracture, also found with elevated troponin I, trending up. ?Plan to continue heparin x 48 hours per  cardiology ?Patient is scheduled for L hip hemiarthroplasty 4/6 at 1030 am.  ? ?Goal of Therapy:  ?Heparin level 0.3-0.7 units/ml ?Monitor platelets by anticoagulation protocol: Yes ?  ?Plan:  ?--Heparin level 0.43, therapeutic  ?--Continue heparin infusion at 750 units/hour  ?--Check confirmatory heparin level in 8 hours   ?--Daily CBC per protocol while on IV heparin ?--Heparin timed to stop 4/8 at 0600 to complete 48 hours of therapy  ? ?Darnelle Bos, PharmD ?06/08/2021 ?2:50 PM ? ? ? ? ?

## 2021-06-08 NOTE — NC FL2 (Signed)
?[place] MEDICAID FL2 LEVEL OF CARE SCREENING TOOL  ?  ? ?IDENTIFICATION  ?Patient Name: ?[name] Birthdate: Jul 18, 1940 Sex: female Admission Date (Current Location): ?06/05/2021  ?[place] and [place] Number: ? [place] ?  Facility and Address:  ?[facility], [address], [place], [place] [address] ?     Provider Number: ?[id number]  ?Attending Physician Name and Address:  ?Fritzi Mandes, MD ? Relative Name and Phone Number:  ?Gwyndolyn Saxon SPouse [phone] ?   ?Current Level of Care: ?Hospital Recommended Level of Care: ?[place] Prior Approval Number: ?  ? ?Date Approved/Denied: ?  PASRR Number: ?[id number] A ? ?Discharge Plan: ?SNF ?  ? ?Current Diagnoses: ?Patient Active Problem List  ? Diagnosis Date Noted  ? Non-STEMI (non-ST elevated myocardial infarction) ([place]) 06/06/2021  ? Fall   ? Elevated troponin   ? Closed left hip fracture ([place]) 06/05/2021  ? Abnormal gait 03/01/2021  ? Cervicalgia 07/13/2020  ? Annual physical exam 06/27/2020  ? Stage 3b chronic kidney disease ([place]) 07/08/2019  ? Prediabetes 07/08/2019  ? Rhinitis 05/14/2017  ? Chronic obstructive pulmonary disease ([place]) 05/14/2017  ? Dizziness 05/14/2017  ? Dysphagia 03/27/2017  ? Anxiety 03/17/2017  ? Runny nose 05/20/2016  ? Chronic systolic CHF (congestive heart failure) ([place]) 05/19/2016  ? Finger injury 04/19/2016  ? Hyperlipidemia 03/24/2015  ? CAD (coronary artery disease)   ? Chronic fatigue   ? Chronic back pain   ? History of coronary artery stent placement   ? S/P repair of vertebral fracture 02/22/2014  ? History of hepatitis C 11/17/2013  ? Essential hypertension 11/17/2013  ? Osteoporosis 11/19/2012  ? ? ?Orientation RESPIRATION BLADDER Height & Weight   ?  ?Self, Time, Situation, Place ? Normal Continent Weight: 68 kg ?Height:     ?BEHAVIORAL SYMPTOMS/MOOD NEUROLOGICAL BOWEL NUTRITION STATUS  ?      Diet (see dc summary)  ?AMBULATORY STATUS COMMUNICATION OF NEEDS Skin   ?Extensive Assist  Verbally Normal, Surgical wounds ?  ?  ?  ?    ?     ?     ? ? ?Personal Care Assistance Level of Assistance  ?Bathing, Feeding Bathing Assistance: Limited assistance ?Feeding assistance: Limited assistance ?  ?   ? ?Functional Limitations Info  ?    ?  ?   ? ? ?SPECIAL CARE FACTORS FREQUENCY  ?PT (By licensed PT), OT (By licensed OT)   ?  ?PT Frequency: 5 times per week ?OT Frequency: 5 times per week ?  ?  ?  ?   ? ? ?Contractures Contractures Info: Not present  ? ? ?Additional Factors Info  ?Code Status, Allergies Code Status Info: full code ?Allergies Info: Sugar, Protein Starch ?  ?  ?  ?   ? ?Current Medications (06/08/2021):  This is the current hospital active medication list ?Current Facility-Administered Medications  ?Medication Dose Route Frequency Provider Last Rate Last Admin  ? 0.9 %  sodium chloride infusion   Intravenous Continuous Leim Fabry, MD 75 mL/hr at 06/08/21 0658 Infusion Verify at 06/08/21 0658  ? acetaminophen (TYLENOL) tablet 1,000 mg  1,000 mg Oral Q8H Leim Fabry, MD   1,000 mg at 06/08/21 0506  ? ALPRAZolam Duanne Moron) tablet 0.25 mg  0.25 mg Oral BID PRN Leim Fabry, MD   0.25 mg at 06/07/21 2143  ? bisacodyl (DULCOLAX) suppository 10 mg  10 mg Rectal Daily PRN Leim Fabry, MD      ? Chlorhexidine Gluconate Cloth 2 % PADS 6  each  6 each Topical Q0600 Fritzi Mandes, MD   6 each at 06/08/21 [phone]  ? citalopram (CELEXA) tablet 10 mg  10 mg Oral Daily Leim Fabry, MD   10 mg at 06/08/21 0846  ? clopidogrel (PLAVIX) tablet 75 mg  75 mg Oral Daily Leim Fabry, MD   75 mg at 06/08/21 0847  ? docusate sodium (COLACE) capsule 100 mg  100 mg Oral BID Leim Fabry, MD      ? heparin ADULT infusion 100 units/mL (25000 units/296mL)  750 Units/hr Intravenous Continuous Fritzi Mandes, MD 7.5 mL/hr at 06/08/21 0658 750 Units/hr at 06/08/21 0658  ? HYDROmorphone (DILAUDID) injection 0.2-0.4 mg  0.2-0.4 mg Intravenous Q4H PRN Leim Fabry, MD      ? oxyCODONE (Oxy IR/ROXICODONE) immediate release tablet 5  mg  5 mg Oral Q4H PRN Fritzi Mandes, MD      ? Or  ? HYDROmorphone (DILAUDID) tablet 1 mg  1 mg Oral Q4H PRN Fritzi Mandes, MD      ? ipratropium (ATROVENT) 0.06 % nasal spray 2 spray  2 spray Each Nare BID PRN Leim Fabry, MD      ? magnesium hydroxide (MILK OF MAGNESIA) suspension 30 mL  30 mL Oral Daily PRN Leim Fabry, MD   30 mL at 06/07/21 2143  ? menthol-cetylpyridinium (CEPACOL) lozenge 3 mg  1 lozenge Oral PRN Leim Fabry, MD      ? Or  ? phenol (CHLORASEPTIC) mouth spray 1 spray  1 spray Mouth/Throat PRN Leim Fabry, MD      ? metoCLOPramide (REGLAN) tablet 5-10 mg  5-10 mg Oral Q8H PRN Leim Fabry, MD      ? Or  ? metoCLOPramide (REGLAN) injection 5-10 mg  5-10 mg Intravenous Q8H PRN Leim Fabry, MD      ? metoprolol succinate (TOPROL-XL) 24 hr tablet 25 mg  25 mg Oral Daily Leim Fabry, MD   25 mg at 06/08/21 9379  ? multivitamin with minerals tablet 1 tablet  1 tablet Oral Daily Leim Fabry, MD   1 tablet at 06/08/21 0846  ? nitroGLYCERIN (NITROSTAT) SL tablet 0.4 mg  0.4 mg Sublingual Q5 min PRN Leim Fabry, MD      ? ondansetron [facility]) tablet 4 mg  4 mg Oral Q6H PRN Leim Fabry, MD      ? Or  ? ondansetron [facility]) injection 4 mg  4 mg Intravenous Q6H PRN Leim Fabry, MD   4 mg at 06/08/21 1333  ? rosuvastatin (CRESTOR) tablet 40 mg  40 mg Oral Daily Leim Fabry, MD   40 mg at 06/08/21 0846  ? sacubitril-valsartan (ENTRESTO) 24-26 mg per tablet  1 tablet Oral BID Leim Fabry, MD   1 tablet at 06/08/21 0845  ? senna-docusate (Senokot-S) tablet 1 tablet  1 tablet Oral QHS PRN Leim Fabry, MD      ? sodium phosphate (FLEET) 7-19 GM/118ML enema 1 enema  1 enema Rectal Once PRN Leim Fabry, MD      ? Derrill Memo ON 06/09/2021] torsemide [facility]) tablet 20 mg  20 mg Oral Once per day on Mon Tue Wed Thu Fri Sat Fritzi Mandes, MD      ? traMADol Veatrice Bourbon) tablet 50 mg  50 mg Oral Q6H PRN Leim Fabry, MD      ? traZODone (DESYREL) tablet 25 mg  25 mg Oral QHS PRN Leim Fabry, MD      ? ? ? ?Discharge  Medications: ?Please see discharge summary for a list of discharge  medications. ? ?Relevant Imaging Results: ? ?Relevant Lab Results: ? ? ?Additional Information ?SS# [id number] ? ?Conception Oms, RN ? ? ? ? ?

## 2021-06-08 NOTE — Evaluation (Signed)
Physical Therapy Evaluation ?Patient Details ?Name: [name] ?MRN: [id number] ?DOB: [birth year] ?Today's Date: 06/08/2021 ? ?History of Present Illness ? Pt is a 81 y.o. female presenting to hospital 4/4 s/p fall (lost balance walking without walker use).  Imaging showing acute comminuted displaced impacted L femoral neck fx; age-indeterminate fx of the R posterolateral 4th rib with mild displacement; multiple remote appearing thoracic compression fx's (new at T5 and T6 since prior imaging).  Pt admitted with closed L hip fx and NSTEMI.  S/p L hip hemiarthroplasty 4/6.  PMH includes CAD, anxiety, OA, ischemic cariomyopathy, L BBB, moderate-severe mitral regurgitation, UTI, CHF, Hepatitis C, chronic back pain, chronic fatigue, back sx.  ?Clinical Impression ? Prior to hospital admission, pt was modified independent ambulating with rollator; lives with her husband in 1 level home (level entry).  Currently pt is 1-2 assist with bed mobility and requiring min to mod assist for sitting balance d/t posterior lean.  Pt c/o mild dizziness laying in bed but pt reporting increased dizziness sitting edge of bed (pt's BP 94/50 sitting edge of bed); unable to progress functional mobility d/t dizziness and sitting balance assist so pt assisted back to bed with 2 assist (pt reporting improved symptoms laying in bed).  Pt with mild L hip pain at rest but pain increased with activity (which also limited sessions activities)--pt pre-medicated with pain meds for therapy session.  Pt would benefit from skilled PT to address noted impairments and functional limitations (see below for any additional details).  Upon hospital discharge, pt would benefit from SNF.   ? ?Recommendations for follow up therapy are one component of a multi-disciplinary discharge planning process, led by the attending physician.  Recommendations may be updated based on patient status, additional functional criteria and insurance authorization. ? ?Follow Up  Recommendations Skilled nursing-short term rehab (<3 hours/day) ? ?  ?Assistance Recommended at Discharge Frequent or constant Supervision/Assistance  ?Patient can return home with the following ? Two people to help with walking and/or transfers;Two people to help with bathing/dressing/bathroom;Assistance with cooking/housework;Direct supervision/assist for financial management;Assist for transportation ? ?  ?Equipment Recommendations Rolling walker (2 wheels);BSC/3in1;Wheelchair (measurements PT);Wheelchair cushion (measurements PT);Hospital bed;Other (comment) (hoyer lift)  ?Recommendations for Other Services ? OT consult  ?  ?Functional Status Assessment Patient has had a recent decline in their functional status and demonstrates the ability to make significant improvements in function in a reasonable and predictable amount of time.  ? ?  ?Precautions / Restrictions Precautions ?Precautions: Posterior Hip;Fall ?Precaution Booklet Issued: Yes (comment) ?Restrictions ?Weight Bearing Restrictions: Yes ?LLE Weight Bearing: Weight bearing as tolerated  ? ?  ? ?Mobility ? Bed Mobility ?Overal bed mobility: Needs Assistance ?Bed Mobility: Supine to Sit, Sit to Supine ?  ?  ?Supine to sit: Max assist, HOB elevated ?Sit to supine: Mod assist, Max assist, +2 for physical assistance, HOB elevated ?  ?General bed mobility comments: assist for trunk and B LE's; vc's for technique ?  ? ?Transfers ?  ?  ?  ?  ?  ?  ?  ?  ?  ?General transfer comment: Deferred d/t pt's c/o dizziness in sitting and requiring assist for sitting balance ?  ? ?Ambulation/Gait ?  ?  ?  ?  ?  ?  ?  ?  ? ?Stairs ?  ?  ?  ?  ?  ? ?Wheelchair Mobility ?  ? ?Modified Rankin (Stroke Patients Only) ?  ? ?  ? ?Balance Overall balance assessment: Needs assistance ?Sitting-balance  support: Bilateral upper extremity supported, Feet supported ?Sitting balance-Leahy Scale: Poor ?Sitting balance - Comments: pt requiring min to mod assist for sitting balance d/t  posterior lean ?Postural control: Posterior lean ?  ?  ?  ?  ?  ?  ?  ?  ?  ?  ?  ?  ?  ?  ?   ? ? ? ?Pertinent Vitals/Pain Pain Assessment ?Pain Assessment: 0-10 ?Pain Score: 2  (6-8/10 with activity) ?Pain Location: L hip ?Pain Descriptors / Indicators: Aching, Tender, Sore, Grimacing, Guarding ?Pain Intervention(s): Limited activity within patient's tolerance, Monitored during session, Premedicated before session, Repositioned  ? ? ?Home Living Family/patient expects to be discharged to:: Private residence ?Living Arrangements: Spouse/significant other ?Available Help at Discharge: Family ?Type of Home: House ?Home Access: Level entry ?  ?  ?  ?Home Layout: One level ?Home Equipment: Rollator (4 wheels);Cane - single point;Shower seat - built in;Grab bars - tub/shower;Grab bars - toilet ?   ?  ?Prior Function Prior Level of Function : Independent/Modified Independent ?  ?  ?  ?  ?  ?  ?Mobility Comments: Modified independent ambulating with rollator. ?  ?  ? ? ?Hand Dominance  ?   ? ?  ?Extremity/Trunk Assessment  ? Upper Extremity Assessment ?Upper Extremity Assessment: Generalized weakness;Defer to OT evaluation ?  ? ?Lower Extremity Assessment ?Lower Extremity Assessment: RLE deficits/detail;LLE deficits/detail ?RLE Deficits / Details: at least 3/5 hip flexion, knee flexion/extension, and DF/PF AROM ?LLE Deficits / Details: at least 2+/5 hip flexion (limited d/t hip pain); at least 3-/5 knee flexion/extension; at least 3/5 ankle DF/PF ?LLE: Unable to fully assess due to pain ?  ? ?Cervical / Trunk Assessment ?Cervical / Trunk Assessment: Normal  ?Communication  ? Communication: No difficulties  ?Cognition Arousal/Alertness: Awake/alert ?Behavior During Therapy: [facility] for tasks assessed/performed, Anxious ?Overall Cognitive Status: Within Functional Limits for tasks assessed ?  ?  ?  ?  ?  ?  ?  ?  ?  ?  ?  ?  ?  ?  ?  ?  ?  ?  ?  ? ?  ?General Comments  Nursing cleared pt for participation in physical therapy.   Pt agreeable to PT session. ? ?  ?Exercises General Exercises - Lower Extremity ?Ankle Circles/Pumps: AROM, Strengthening, Both, 10 reps, Supine ?Quad Sets: AROM, Strengthening, Both, 10 reps, Supine ?Gluteal Sets: AROM, Strengthening, Both, 10 reps, Supine ?Short Arc Quad: AROM, Strengthening, Both, 10 reps, Supine ?Heel Slides: AROM, Right, AAROM, Left, Strengthening, 10 reps, Supine ?Hip ABduction/ADduction: AROM, Right, AAROM, Left, Strengthening, 10 reps, Supine  ? ?Assessment/Plan  ?  ?PT Assessment Patient needs continued PT services  ?PT Problem List Decreased strength;Decreased activity tolerance;Decreased balance;Decreased mobility;Decreased knowledge of use of DME;Decreased knowledge of precautions;Pain;Decreased skin integrity ? ?   ?  ?PT Treatment Interventions DME instruction;Gait training;Functional mobility training;Therapeutic activities;Therapeutic exercise;Balance training;Patient/family education   ? ?PT Goals (Current goals can be found in the Care Plan section)  ?Acute Rehab PT Goals ?Patient Stated Goal: to go home ?PT Goal Formulation: With patient ?Time For Goal Achievement: 06/22/21 ?Potential to Achieve Goals: Fair ? ?  ?Frequency BID ?  ? ? ?Co-evaluation   ?  ?  ?  ?  ? ? ?  ?AM-PAC PT "6 Clicks" Mobility  ?Outcome Measure Help needed turning from your back to your side while in a flat bed without using bedrails?: A Lot ?Help needed moving from lying on your back to sitting on  the side of a flat bed without using bedrails?: A Lot ?Help needed moving to and from a bed to a chair (including a wheelchair)?: Total ?Help needed standing up from a chair using your arms (e.g., wheelchair or bedside chair)?: Total ?Help needed to walk in hospital room?: Total ?Help needed climbing 3-5 steps with a railing? : Total ?6 Click Score: 8 ? ?  ?End of Session Equipment Utilized During Treatment: Gait belt ?Activity Tolerance: Other (comment) (limited d/t dizziness) ?Patient left: in bed;with call  bell/phone within reach;with bed alarm set;with family/visitor present;with SCD's reapplied;Other (comment) (pillow between pt's LE's for posterior THP's with heels floating) ?Nurse Communication: Mobility status

## 2021-06-08 NOTE — TOC Progression Note (Signed)
Transition of Care (TOC) - Progression Note  ? ? ?Patient Details  ?Name: [name] ?MRN: [id number] ?Date of Birth: [birth year] ? ?Transition of Care (TOC) CM/SW Contact  ?Conception Oms, RN ?Phone Number: ?06/08/2021, 3:41 PM ? ?Clinical Narrative:    ? ?PASSR obtained, Fl2 completed, Bedsearch done, review beds once bed offers are in, May have Cardiac Cath Monday ? ?  ?  ?Expected Discharge Plan and Services ?  ?  ?  ?  ?  ?                ?  ?  ?  ?  ?  ?  ?  ?  ?  ?  ? ? ?Social Determinants of Health (SDOH) Interventions ?  ? ?Readmission Risk Interventions ?   ? View : No data to display.  ?  ?  ?  ? ? ?

## 2021-06-08 NOTE — Progress Notes (Signed)
PT Cancellation Note ? ?Patient Details ?Name: [name] ?MRN: [id number] ?DOB: January 16, 1941 ? ? ?Cancelled Treatment:    Reason Eval/Treat Not Completed: Other (comment).  Attempted to see pt early this afternoon but pt nauseas and reporting not feeling well (nurse notified).  Talked with pt's nurse later in afternoon and nurse reports pt still not feeling well (BP also still low) and recommending holding therapy until tomorrow.  Will re-attempt PT session tomorrow. ? ?Leitha Bleak, PT ?06/08/21, 3:33 PM ? ?

## 2021-06-08 NOTE — Progress Notes (Signed)
? ?  PATIENT NAME: [name]   ? MR#:  [id number] ? DATE OF BIRTH:  1940-07-30 ? ?Subjective: Nurse reports patient with hypotension 82/47 (59).. HR 84. Unable to assess rhythm, not currently on telemetry.  Already given IV fluid bolus earlier today with hypotensive episode. Here with hip fracture, hx CAD and found to have NSTEM with peak trop  1935 4/5 and BNP elevated  1590 4/5, up from previous day 920.  ECHO 4/5 EF 25-30 %. Heparin restarted this am  ? ? ?Objective: ?Vitals:  ? 06/08/21 1744 06/08/21 2108  ?BP: (!) 99/52 (!) 82/47  ?Pulse: 87 84  ?Resp: 16 (!) 22  ?Temp: 98.3 ?F (36.8 ?C)   ?SpO2: 99% 96%  ? ?rectal temp 98.7 ?Repeat chem panel, vbg, cbc shows AKI, with creat now 1.5 from 1.15, bicarb of 20 on chem panel , sodium now slightly low 134 ?Corrected calcium 7.8. Transaminitits, bili normal, procalcitonin 0.42, mild leukocytosis ?EKG reviewed by me, SR with LBBB no change from prior except worsening QT prolongation ?Chest xray mild pulmonary vascular congestion, bibasilar airspace disease, previously known right rib fracture ?Head CT negative for acute findings ? ?Assessment:  ?General pale, dry mucous membranes ?Neuro - lethargic, Oriented to self only, PERRL, no focal deficits ?CV - S1S2, no gallup, known murmur not appreciated on this exam.  ?Radial and pedal pulses equal bilaterally, cold extremities ?Pulm - diminished air flow, crackles LLL, sats 100 % on 3 LNC,  ? ?Plan:  ?Acute encephalopathy ?Mentation greatly improved after narcan ?Hold opioids and sedative medications ? ?Hypotension ?Likely from low vascular volume and  ? ? ?Pneumonia - start doxy and rocephin ?Urine for step and legionella ?IS ?Flutter valve ?Mobilize ?Wean oxygen (started post op, sats 100 %) ? ?Acute Kidney injury - likely from hypotension - monitor pressures hold further diuretic therapy  ?Avoid nephrotoxic agents ?Monitor renal function ? ?Proteinuria ?Looks like ongoing for past several years ?Refer to  nephrology ? ? ?Hypoalbuminemia ?Likely contributory factor for hypotension ?25 gms albumin x1 ordered ? ?Transaminitis ?Likely from hypotension ?Discontinue statin for now ?Abd/pelvic CT without revealing evidence of hepatobiliary dysfunction ? ?Pronlonged Qtc ?Discontinued QT prolonging meds  ?Monitor on tele ?Keep K abov  4 and mag above 2 ? ?NSTEMI ?Tronponin still downtrending ?No acute symptoms,  ? ?  ?

## 2021-06-08 NOTE — Progress Notes (Signed)
Patient unable to void since foley removal this AM. Attempted on bedpan, no success. Bladder scan showed 181 mL after several attempts. Patient's abd soft, not distended. No complaints of pain. MD Posey Pronto notified, will continue to encourage PO fluids and monitor. ?

## 2021-06-08 NOTE — Progress Notes (Signed)
? ? ?Progress Note ? ?Patient Name: [name] ?Date of Encounter: 06/08/2021 ? ?Primary Cardiologist: Rockey Situ ? ?Subjective  ? ?She underwent left hemiarthroplasty on 06/07/2021. Heparin gtt resumed this morning for NSTEMI with troponin peaking at 1935 and now down trending. Echo this admission showed a persistent cardiomyopathy with an EF of 25-30% (prior 20-25% by echo in 2019). ? ?No chest pain or dyspnea. Sleepy this morning, just received pain medication prior to PT. BP 90s to low 299B mmHg systolic.  ? ?Inpatient Medications  ?  ?Scheduled Meds: ? acetaminophen  1,000 mg Oral Q8H  ? Chlorhexidine Gluconate Cloth  6 each Topical Q0600  ? citalopram  10 mg Oral Daily  ? clopidogrel  75 mg Oral Daily  ? docusate sodium  100 mg Oral BID  ? metoprolol succinate  25 mg Oral Daily  ? multivitamin with minerals  1 tablet Oral Daily  ? rosuvastatin  40 mg Oral Daily  ? sacubitril-valsartan  1 tablet Oral BID  ? ?Continuous Infusions: ? sodium chloride 75 mL/hr at 06/08/21 0658  ? heparin 750 Units/hr (06/08/21 0658)  ? ?PRN Meds: ?ALPRAZolam, bisacodyl, HYDROmorphone (DILAUDID) injection, oxyCODONE **OR** HYDROmorphone, oxyCODONE **OR** HYDROmorphone, ipratropium, magnesium hydroxide, menthol-cetylpyridinium **OR** phenol, metoCLOPramide **OR** metoCLOPramide (REGLAN) injection, nitroGLYCERIN, ondansetron **OR** ondansetron (ZOFRAN) IV, senna-docusate, sodium phosphate, traMADol, traZODone, zolpidem  ? ?Vital Signs  ?  ?Vitals:  ? 06/07/21 1950 06/07/21 2326 06/08/21 0321 06/08/21 0750  ?BP: (!) 109/59 (!) 91/51 (!) 112/51 (!) 104/59  ?Pulse: (!) 102 88 85 84  ?Resp: 14 15 15 16   ?Temp: 98.1 ?F (36.7 ?C) 98.1 ?F (36.7 ?C) 98.2 ?F (36.8 ?C) 97.6 ?F (36.4 ?C)  ?TempSrc:      ?SpO2: 90% 93% 96% 95%  ?Weight:      ? ? ?Intake/Output Summary (Last 24 hours) at 06/08/2021 1110 ?Last data filed at 06/08/2021 7169 ?Gross per 24 hour  ?Intake 2229.11 ml  ?Output 1950 ml  ?Net 279.11 ml  ? ?Filed Weights  ? 06/05/21 1652  ?Weight:  68 kg  ? ? ?Telemetry  ?  ?Not on tele - Personally Reviewed ? ?ECG  ?  ?No new tracings - Personally Reviewed ? ?Physical Exam  ? ?GEN: No acute distress.   ?Neck: No JVD. ?Cardiac: RRR, II/VI systolic murmur LSB, no rubs, or gallops.  ?Respiratory: Clear to auscultation bilaterally.  ?GI: Soft, nontender, non-distended.   ?MS: No edema; No deformity. ?Neuro:  Somnolent; Nonfocal.  ?Psych: Somnolent. ? ?Labs  ?  ?Chemistry ?Recent Labs  ?Lab 06/05/21 ?1706 06/06/21 ?0350 06/08/21 ?0344  ?NA 140 139 137  ?K 4.3 4.4 3.9  ?CL 103 104 103  ?CO2 26 26 21*  ?GLUCOSE 159* 127* 141*  ?BUN 24* 21 22  ?CREATININE 1.47* 1.33* 1.15*  ?CALCIUM 9.4 8.7* 7.3*  ?PROT 8.2*  --   --   ?ALBUMIN 4.4  --   --   ?AST 29  --   --   ?ALT 19  --   --   ?ALKPHOS 54  --   --   ?BILITOT 1.2  --   --   ?GFRNONAA 36* 41* 48*  ?ANIONGAP 11 9 13   ?  ? ?Hematology ?Recent Labs  ?Lab 06/06/21 ?0350 06/07/21 ?6789 06/08/21 ?0344  ?WBC 10.3 7.9 10.1  ?RBC 3.86* 3.66* 3.29*  ?HGB 12.0 11.5* 10.6*  ?HCT 38.0 36.0 32.5*  ?MCV 98.4 98.4 98.8  ?MCH 31.1 31.4 32.2  ?MCHC 31.6 31.9 32.6  ?RDW 13.6 13.5 13.2  ?  PLT 180 145* 168  ? ? ?Cardiac EnzymesNo results for input(s): TROPONINI in the last 168 hours. No results for input(s): TROPIPOC in the last 168 hours.  ? ?BNP ?Recent Labs  ?Lab 06/05/21 ?1706 06/06/21 ?0350  ?BNP 820.3* 1,590.0*  ?  ? ?DDimer No results for input(s): DDIMER in the last 168 hours.  ? ?Radiology  ?  ?DG Pelvis Portable ? ?Result Date: 06/07/2021 ?IMPRESSION: Normal alignment left hip hemiarthroplasty. Electronically Signed   By: Aletta Edouard M.D.   On: 06/07/2021 12:20  ? ?DG HIP PORT UNILAT WITH PELVIS 1V LEFT ? ?Result Date: 06/07/2021 ?IMPRESSION: Intraoperative left hip radiograph with no unexpected finding. Electronically Signed   By: Jorje Guild M.D.   On: 06/07/2021 12:36  ? ?DG HIP UNILAT WITH PELVIS 2-3 VIEWS LEFT ? ?Result Date: 06/07/2021 ?IMPRESSION: No evidence of left hip arthroplasty complication. Electronically Signed    By: Maurine Simmering M.D.   On: 06/07/2021 14:06   ? ?Cardiac Studies  ? ?2D echo 06/06/2021: ?1. Left ventricular ejection fraction, by estimation, is 25 to 30%. The  ?left ventricle has severely decreased function. The left ventricle  ?demonstrates global hypokinesis. The left ventricular internal cavity size  ?was mildly dilated. There is moderate  ?asymmetric left ventricular hypertrophy of the septal segment. Left  ?ventricular diastolic parameters are consistent with Grade II diastolic  ?dysfunction (pseudonormalization).  ? 2. Right ventricular systolic function is low normal. The right  ?ventricular size is mildly enlarged.  ? 3. Left atrial size was moderately dilated.  ? 4. The mitral valve is normal in structure. Moderate mitral valve  ?regurgitation.  ? 5. The aortic valve is tricuspid. Aortic valve regurgitation is trivial. ?__________ ? ?2D echo 06/16/2017: ?- Left ventricle: The cavity size was severely dilated. Wall  ?  thickness was normal. Systolic function was severely reduced. The  ?  estimated ejection fraction was in the range of 20% to 25%.  ?  Diffuse hypokinesis. Features are consistent with a pseudonormal  ?  left ventricular filling pattern, with concomitant abnormal  ?  relaxation and increased filling pressure (grade 2 diastolic  ?  dysfunction).  ?- Aortic valve: There was mild regurgitation.  ?- Mitral valve: There was moderate to severe regurgitation.  ?- Left atrium: The atrium was moderately dilated.  ?- Pulmonary arteries: Systolic pressure could not be accurately  ?  estimated. ?___________ ? ?2D echo 01/23/2015: ?- Left ventricle: The cavity size was moderately dilated. Systolic  ?  function was severely reduced. The estimated ejection fraction  ?  was in the range of 20% to 25%. Diffuse hypokinesis. Regional  ?  wall motion abnormalities cannot be excluded. Doppler parameters  ?  are consistent with abnormal left ventricular relaxation (grade 1  ?  diastolic dysfunction).  ?- Aortic  valve: There was mild regurgitation.  ?- Mitral valve: There was moderate regurgitation.  ?- Left atrium: The atrium was mildly dilated.  ?- Right ventricle: Systolic function was normal.  ?- Pulmonary arteries: Systolic pressure was mildly elevated. PA  ?  peak pressure: 43 mm Hg (S).  ?- Pericardium, extracardiac: A small circumferential pericardial  ?  effusion was identified, no hemodynamic compromise. ?__________ ? ?LHC 10/27/2014: ?Ost LAD lesion, 30% stenosed. ?Ost RCA lesion, 30% stenosed. ?Ost Ramus lesion, 50% stenosed. ?Mid RCA lesion, 90% stenosed. There is a 0% residual stenosis post intervention. ?A drug-eluting stent was placed. ?Prox LAD lesion, 90% stenosed. There is a 0% residual stenosis post intervention. ?A drug-eluting  stent was placed. ?  ? Successful angioplasty and drug-eluting stent placement to both mid RCA in mid LAD. ?  ? Recommendations: ? Continue dual antiplatelets therapy for at least 12 months. Recommend aggressive treatment of risk factors. I started small dose carvedilol and lisinopril for cardiomyopathy. ?__________ ? ?2D echo 10/25/2014: ?- Left ventricle: LVEF is severely depressed at approximately 20%  ?  with akinesis of the inferior, inferoseptal, distal anterior and  ?  apical walls; hypokinesis elsewhere. The cavity size was mildly  ?  dilated. Wall thickness was normal.  ?- Aortic valve: There was mild regurgitation.  ?- Mitral valve: There was mild to moderate regurgitation.  ?- Left atrium: The atrium was mildly dilated.  ?- Right ventricle: Systolic function was moderately to severely  ?  reduced.  ?- Pericardium, extracardiac: A small pericardial effusion was  ?  identified. There was a left pleural effusion. ? ?Patient Profile  ?   ?81 y.o. female with history of CAD s/p PCI to the mid LAD and mid RCA in 01/2015, HFrEF secondary to ICM, mitral regurgitation, LBBB, HTN, HLD, hepatitis C, CKD stage III, chronic back pain s/p multiple surgeries, and chronic UTIs who was  admitted with a left hip fracture in the setting of a mechanical fall whom we are seeing for NSTEMI.  ? ?Assessment & Plan  ?  ?1. CAD involving the native coronary arteries with NSTEMI: ?-No symptoms o

## 2021-06-08 NOTE — Progress Notes (Signed)
?  Subjective: ?1 Day Post-Op Procedure(s) (LRB): ?ARTHROPLASTY BIPOLAR HIP (HEMIARTHROPLASTY) (Left) ?Patient reports pain as moderate.   ?Patient is well, and has had no acute complaints or problems ?Plan is to go Rehab versus home after hospital stay. ?Negative for chest pain and shortness of breath ?Fever: no ?Gastrointestinal: Negative for nausea and vomiting ? ?Objective: ?Vital signs in last 24 hours: ?Temp:  [96.9 ?F (36.1 ?C)-98.2 ?F (36.8 ?C)] 98.2 ?F (36.8 ?C) (04/07 0321) ?Pulse Rate:  [85-107] 85 (04/07 0321) ?Resp:  [13-22] 15 (04/07 0321) ?BP: (91-126)/(41-62) 112/51 (04/07 0321) ?SpO2:  [56 %-100 %] 96 % (04/07 0321) ? ?Intake/Output from previous day: ? ?Intake/Output Summary (Last 24 hours) at 06/08/2021 0720 ?Last data filed at 06/08/2021 8182 ?Gross per 24 hour  ?Intake 2279.11 ml  ?Output 1950 ml  ?Net 329.11 ml  ?  ?Intake/Output this shift: ?No intake/output data recorded. ? ?Labs: ?Recent Labs  ?  06/05/21 ?1706 06/06/21 ?0350 06/07/21 ?0437 06/08/21 ?0344  ?HGB 13.4 12.0 11.5* 10.6*  ? ?Recent Labs  ?  06/07/21 ?0437 06/08/21 ?0344  ?WBC 7.9 10.1  ?RBC 3.66* 3.29*  ?HCT 36.0 32.5*  ?PLT 145* 168  ? ?Recent Labs  ?  06/06/21 ?0350 06/08/21 ?0344  ?NA 139 137  ?K 4.4 3.9  ?CL 104 103  ?CO2 26 21*  ?BUN 21 22  ?CREATININE 1.33* 1.15*  ?GLUCOSE 127* 141*  ?CALCIUM 8.7* 7.3*  ? ?Recent Labs  ?  06/05/21 ?1706  ?INR 1.0  ? ? ? ?EXAM ?General - Patient is Alert and Oriented ?Extremity - Neurovascular intact ?Sensation intact distally ?Dorsiflexion/Plantar flexion intact ?Compartment soft ?Dressing/Incision - clean, dry, no drainage ?Motor Function - intact, moving foot and toes well on exam.  ? ?Past Medical History:  ?Diagnosis Date  ? Anxiety   ? Arthritis   ? CAD (coronary artery disease)   ? a. cardiac cath 10/2014: ostial LAD 30%, prox LAD 90% s/p PCI/DES, ostial RCA 30%, mid RCA 90% s/p PCI/DES, ostial Ramus 50%  ? Chronic back pain   ? Chronic fatigue   ? Chronic systolic CHF (congestive heart  failure) ([place])   ? a. 10/2014 Echo: EF 20%; b. 06/2017 Echo: EF 20-25%, diff HK, Gr2 DD, Mild AI. Mod to sev MR. Mod dil LA.  ? Hepatitis C   ? Ischemic cardiomyopathy   ? a. 10/2014 Echo: EF 20%; b. 06/2017 Echo: EF 20-25%.  ? LBBB (left bundle branch block)   ? Moderate to Severe Mitral regurgitation   ? a. 06/2017 Echo: Mod-Sev MR.  ? Osteoporosis   ? UTI (lower urinary tract infection)   ? ? ?Assessment/Plan: ?1 Day Post-Op Procedure(s) (LRB): ?ARTHROPLASTY BIPOLAR HIP (HEMIARTHROPLASTY) (Left) ?Principal Problem: ?  Closed left hip fracture ([place]) ?Active Problems: ?  Essential hypertension ?  Hyperlipidemia ?  Chronic systolic CHF (congestive heart failure) ([place]) ?  Non-STEMI (non-ST elevated myocardial infarction) ([place]) ?  Fall ?  Elevated troponin ? ?Estimated body mass index is 29.29 kg/m? as calculated from the following: ?  Height as of 05/08/21: 5' (1.524 m). ?  Weight as of this encounter: 68 kg. ?Advance diet ?Up with therapy ?D/C IV fluids ? ?DVT Prophylaxis - Foot Pumps, TED hose, and Plavix ?Weight-Bearing as tolerated to left leg ? ?Reche Dixon, PA-C ?Orthopaedic Surgery ?06/08/2021, 7:20 AM ? ?

## 2021-06-08 NOTE — Progress Notes (Addendum)
OT Cancellation Note ? ?Patient Details ?Name: [name] ?MRN: [id number] ?DOB: 12-Mar-1940 ? ? ?Cancelled Treatment:    Reason Eval/Treat Not Completed: Patient declined, no reason specified. Consult received, chart reviewed. Upon initial attempt, pt finishing with PT, very dizzy. On 2nd attempt pt nauseated, not feeling well. Will re-attempt OT evaluation at later time as pt is able to tolerate.  ? ?Addendum, 2:59pm: On 3rd attempt, pt in bed, endorsing still feeling unwell and BP still low. Family present. Will hold OT today and re-attempt next date as medically appropriate.  ? ?Ardeth Perfect., MPH, MS, OTR/L ?ascom [phone] ?06/08/21, 1:35 PM ? ?

## 2021-06-08 NOTE — Progress Notes (Signed)
[facility] [place] [facility] ? ? ?PATIENT NAME: [name]   ? ?MR#:  [id number] ? ?DATE OF BIRTH:  [birth year] ? ?SUBJECTIVE:  ? ?husband at bedside. Patient came in after mechanical fall. She usually walks with walker. Started having left hip pain. Found to have left impacted femoral neck fracture. ? ?Pt c/o hip pain. Worked with PT ?Husband at bedside ? ?VITALS:  ?Blood pressure (!) 94/50, pulse 84, temperature 97.6 ?F (36.4 ?C), resp. rate 16, weight 68 kg, SpO2 95 %. ? ?PHYSICAL EXAMINATION:  ? ?GENERAL:  81 y.o.-year-old patient lying in the bed with no acute distress.  ?LUNGS: Normal breath sounds bilaterally, no wheezing, rales, rhonchi.  ?CARDIOVASCULAR: S1, S2 normal. No murmurs, rubs, or gallops.  ?ABDOMEN: Soft, nontender, nondistended. Bowel sounds present.  ?EXTREMITIES: left hip ROM+ ?NEUROLOGIC: nonfocal  patient is alert and awake ?SKIN: No obvious rash, lesion, or ulcer.  ? ?LABORATORY PANEL:  ?CBC ?Recent Labs  ?Lab 06/08/21 ?3244  ?WBC 10.1  ?HGB 10.6*  ?HCT 32.5*  ?PLT 168  ? ? ? ?Chemistries  ?Recent Labs  ?Lab 06/05/21 ?1706 06/06/21 ?0350 06/08/21 ?0344  ?NA 140   < > 137  ?K 4.3   < > 3.9  ?CL 103   < > 103  ?CO2 26   < > 21*  ?GLUCOSE 159*   < > 141*  ?BUN 24*   < > 22  ?CREATININE 1.47*   < > 1.15*  ?CALCIUM 9.4   < > 7.3*  ?AST 29  --   --   ?ALT 19  --   --   ?ALKPHOS 54  --   --   ?BILITOT 1.2  --   --   ? < > = values in this interval not displayed.  ? ? ?Cardiac Enzymes ?No results for input(s): TROPONINI in the last 168 hours. ?RADIOLOGY:  ?DG Pelvis Portable ? ?Result Date: 06/07/2021 ?CLINICAL DATA:  Status post left hip arthroplasty. EXAM: PORTABLE PELVIS 1-2 VIEWS COMPARISON:  06/05/2021 FINDINGS: In the from projection visualized left hip hemiarthroplasty demonstrates normal alignment. No surrounding fracture. IMPRESSION: Normal alignment left hip hemiarthroplasty. Electronically Signed   By: Aletta Edouard M.D.   On: 06/07/2021 12:20  ? ?ECHOCARDIOGRAM  COMPLETE ? ?Result Date: 06/06/2021 ?   ECHOCARDIOGRAM REPORT   Patient Name:   [name] Date of Exam: 06/06/2021 Medical Rec #:  [id number]     Height:       60.0 in Accession #:    [id number]    Weight:       150.0 lb Date of Birth:  1940/03/06     BSA:          1.652 m? Patient Age:    81 years      BP:           129/61 mmHg Patient Gender: F             HR:           87 bpm. Exam Location:  [facility] Procedure: 2D Echo, Cardiac Doppler and Color Doppler Indications:     Elevated Troponin  History:         Patient has prior history of Echocardiogram examinations, most                  recent 06/16/2017. Ischemic cardiomyopathy; CAD. Chronic                  systolic CHF.  Sonographer:     Sherrie Sport Referring Phys:  [id number] Kate Sable Diagnosing Phys: Kate Sable MD IMPRESSIONS  1. Left ventricular ejection fraction, by estimation, is 25 to 30%. The left ventricle has severely decreased function. The left ventricle demonstrates global hypokinesis. The left ventricular internal cavity size was mildly dilated. There is moderate asymmetric left ventricular hypertrophy of the septal segment. Left ventricular diastolic parameters are consistent with Grade II diastolic dysfunction (pseudonormalization).  2. Right ventricular systolic function is low normal. The right ventricular size is mildly enlarged.  3. Left atrial size was moderately dilated.  4. The mitral valve is normal in structure. Moderate mitral valve regurgitation.  5. The aortic valve is tricuspid. Aortic valve regurgitation is trivial. FINDINGS  Left Ventricle: Left ventricular ejection fraction, by estimation, is 25 to 30%. The left ventricle has severely decreased function. The left ventricle demonstrates global hypokinesis. The left ventricular internal cavity size was mildly dilated. There is moderate asymmetric left ventricular hypertrophy of the septal segment. Left ventricular diastolic parameters are consistent with Grade II diastolic  dysfunction (pseudonormalization). Right Ventricle: The right ventricular size is mildly enlarged. No increase in right ventricular wall thickness. Right ventricular systolic function is low normal. Left Atrium: Left atrial size was moderately dilated. Right Atrium: Right atrial size was normal in size. Pericardium: There is no evidence of pericardial effusion. Mitral Valve: The mitral valve is normal in structure. Moderate mitral valve regurgitation. Tricuspid Valve: The tricuspid valve is normal in structure. Tricuspid valve regurgitation is not demonstrated. Aortic Valve: The aortic valve is tricuspid. Aortic valve regurgitation is trivial. Aortic regurgitation PHT measures 392 msec. Aortic valve mean gradient measures 4.3 mmHg. Aortic valve peak gradient measures 7.2 mmHg. Aortic valve area, by VTI measures  1.46 cm?. Pulmonic Valve: The pulmonic valve was not well visualized. Pulmonic valve regurgitation is not visualized. Aorta: The aortic root is normal in size and structure. Venous: The inferior vena cava was not well visualized. IAS/Shunts: No atrial level shunt detected by color flow Doppler.  LEFT VENTRICLE PLAX 2D LVIDd:         5.50 cm      Diastology LVIDs:         5.20 cm      LV e' medial:   6.74 cm/s LV PW:         1.50 cm      LV E/e' medial: 20.9 LV IVS:        1.35 cm LVOT diam:     2.00 cm LV SV:         39 LV SV Index:   23 LVOT Area:     3.14 cm?  LV Volumes (MOD) LV vol d, MOD A2C: 219.0 ml LV vol d, MOD A4C: 276.0 ml LV vol s, MOD A2C: 201.0 ml LV vol s, MOD A4C: 182.0 ml LV SV MOD A2C:     18.0 ml LV SV MOD A4C:     276.0 ml LV SV MOD BP:      41.9 ml RIGHT VENTRICLE RV Basal diam:  4.50 cm LEFT ATRIUM             Index        RIGHT ATRIUM           Index LA diam:        3.50 cm 2.12 cm/m?   RA Area:     12.40 cm? LA Vol (A2C):   61.1 ml 36.99 ml/m?  RA Volume:   29.20  ml  17.68 ml/m? LA Vol (A4C):   92.3 ml 55.88 ml/m? LA Biplane Vol: 75.2 ml 45.53 ml/m?  AORTIC VALVE                     PULMONIC VALVE AV Area (Vmax):    1.60 cm?     PV Vmax:        1.02 m/s AV Area (Vmean):   1.32 cm?     PV Vmean:       57.000 cm/s AV Area (VTI):     1.46 cm?     PV VTI:         0.152 m AV Vmax:           134.33 cm/s  PV Peak grad:   4.2 mmHg AV Vmean:          97.967 cm/s  PV Mean grad:   2.0 mmHg AV VTI:            0.265 m      RVOT Peak grad: 5 mmHg AV Peak Grad:      7.2 mmHg AV Mean Grad:      4.3 mmHg LVOT Vmax:         68.50 cm/s LVOT Vmean:        41.200 cm/s LVOT VTI:          0.123 m LVOT/AV VTI ratio: 0.46 AI PHT:            392 msec  AORTA Ao Root diam: 2.87 cm MITRAL VALVE                TRICUSPID VALVE MV Area (PHT): 5.62 cm?     TR Peak grad:   46.0 mmHg MV Decel Time: 135 msec     TR Vmax:        339.00 cm/s MR Peak grad: 117.9 mmHg MR Vmax:      543.00 cm/s   SHUNTS MV E velocity: 141.00 cm/s  Systemic VTI:  0.12 m                             Systemic Diam: 2.00 cm                             Pulmonic VTI:  0.164 m Kate Sable MD Electronically signed by Kate Sable MD Signature Date/Time: 06/06/2021/3:07:46 PM    Final   ? ?DG HIP PORT UNILAT WITH PELVIS 1V LEFT ? ?Result Date: 06/07/2021 ?CLINICAL DATA:  Left hip fracture EXAM: DG HIP (WITH OR WITHOUT PELVIS) 1V PORT LEFT COMPARISON:  None. FINDINGS: Intra procedural radiograph showing femoral stem in place. No evidence of intraoperative fracture when allowing for extensive artifact and soft tissue gas. IMPRESSION: Intraoperative left hip radiograph with no unexpected finding. Electronically Signed   By: Jorje Guild M.D.   On: 06/07/2021 12:36  ? ?DG HIP UNILAT WITH PELVIS 2-3 VIEWS LEFT ? ?Result Date: 06/07/2021 ?CLINICAL DATA:  Left total hip replacement EXAM: DG HIP (WITH OR WITHOUT PELVIS) 2-3V LEFT COMPARISON:  06/07/2021, 06/05/2021 FINDINGS: There is a left hip arthroplasty in normal alignment without evidence of loosening or periprosthetic fracture. Expected soft tissue changes. IMPRESSION: No evidence of left hip arthroplasty  complication. Electronically Signed   By: Maurine Simmering M.D.   On: 06/07/2021 14:06   ? ?Assessment and Plan ?AMILIAH CAMPISI is a 81 y.o.  Caucasian female with medical history significant for coronary artery dise

## 2021-06-08 NOTE — Discharge Instructions (Signed)
POSTERIOR TOTAL HIP REPLACEMENT POSTOPERATIVE DIRECTIONS ? ?Hip Rehabilitation, Guidelines Following Surgery  ?The results of a hip operation are greatly improved after range of motion and muscle strengthening exercises. Follow all safety measures which are given to protect your hip. If any of these exercises cause increased pain or swelling in your joint, decrease the amount until you are comfortable again. Then slowly increase the exercises. Call your caregiver if you have problems or questions.  ? ?HOME CARE INSTRUCTIONS  ?Remove items at home which could result in a fall. This includes throw rugs or furniture in walking pathways.  ?ICE to the affected hip every three hours for 30 minutes at a time and then as needed for pain and swelling.  Continue to use ice on the hip for pain and swelling from surgery. You may notice swelling that will progress down to the foot and ankle.  This is normal after surgery.  Elevate the leg when you are not up walking on it.   ?Continue to use the breathing machine which will help keep your temperature down.  It is common for your temperature to cycle up and down following surgery, especially at night when you are not up moving around and exerting yourself.  The breathing machine keeps your lungs expanded and your temperature down. ? ?DIET ?You may resume your previous home diet once your are discharged from the hospital. ? ?DRESSING / WOUND CARE / SHOWERING ?You may change your dressing 3-5 days after surgery.  Then change the dressing every day with sterile gauze.  Please use good hand washing techniques before changing the dressing.  Do not use any lotions or creams on the incision until instructed by your surgeon. ?You need to keep your dressing dry after discharge.   ?Change the surgical dressing if needed with Physical Therapy and reapply a dry dressing each time. ? ? ? ?ACTIVITY ?Walk with your walker as instructed. ?Use walker as long as suggested by your  caregivers. ?Avoid periods of inactivity such as sitting longer than an hour when not asleep. This helps prevent blood clots.  ?You may resume a sexual relationship in one month or when given the OK by your doctor.  ?You may return to work once you are cleared by your doctor.  ?Do not drive a car for 6 weeks or until released by you surgeon.  ?Do not drive while taking narcotics. ? ?WEIGHT BEARING ?Weight bearing as tolerated with assist device (walker, cane, etc) as directed, use it as long as suggested by your surgeon or therapist, typically at least 4-6 weeks. ? ?POSTOPERATIVE CONSTIPATION PROTOCOL ?Constipation - defined medically as fewer than three stools per week and severe constipation as less than one stool per week. ? ?One of the most common issues patients have following surgery is constipation.  Even if you have a regular bowel pattern at home, your normal regimen is likely to be disrupted due to multiple reasons following surgery.  Combination of anesthesia, postoperative narcotics, change in appetite and fluid intake all can affect your bowels.  In order to avoid complications following surgery, here are some recommendations in order to help you during your recovery period. ? ?Colace (docusate) - Pick up an over-the-counter form of Colace or another stool softener and take twice a day as long as you are requiring postoperative pain medications.  Take with a full glass of water daily.  If you experience loose stools or diarrhea, hold the colace until you stool forms back up.  If  your symptoms do not get better within 1 week or if they get worse, check with your doctor. ? ?Dulcolax (bisacodyl) - Pick up over-the-counter and take as directed by the product packaging as needed to assist with the movement of your bowels.  Take with a full glass of water.  Use this product as needed if not relieved by Colace only.  ? ?MiraLax (polyethylene glycol) - Pick up over-the-counter to have on hand.  MiraLax is a  solution that will increase the amount of water in your bowels to assist with bowel movements.  Take as directed and can mix with a glass of water, juice, soda, coffee, or tea.  Take if you go more than two days without a movement. ?Do not use MiraLax more than once per day. Call your doctor if you are still constipated or irregular after using this medication for 7 days in a row. ? ?If you continue to have problems with postoperative constipation, please contact the office for further assistance and recommendations.  If you experience "the worst abdominal pain ever" or develop nausea or vomiting, please contact the office immediatly for further recommendations for treatment. ? ?ITCHING ? If you experience itching with your medications, try taking only a single pain pill, or even half a pain pill at a time.  You can also use Benadryl over the counter for itching or also to help with sleep.  ? ?TED HOSE STOCKINGS ?Wear the elastic stockings on both legs for three weeks following surgery during the day but you may remove then at night for sleeping. ? ?MEDICATIONS ?See your medication summary on the ?After Visit Summary? that the nursing staff will review with you prior to discharge.  You may have some home medications which will be placed on hold until you complete the course of blood thinner medication.  It is important for you to complete the blood thinner medication as prescribed by your surgeon.  Continue your approved medications as instructed at time of discharge. ? ?PRECAUTIONS ?If you experience chest pain or shortness of breath - call 911 immediately for transfer to the hospital emergency department.  ?If you develop a fever greater that 101 F, purulent drainage from wound, increased redness or drainage from wound, foul odor from the wound/dressing, or calf pain - CONTACT YOUR SURGEON.   ?                                                ?FOLLOW-UP APPOINTMENTS ?Make sure you keep all of your appointments after  your operation with your surgeon and caregivers. You should call the office at the above phone number and make an appointment for approximately two weeks after the date of your surgery or on the date instructed by your surgeon outlined in the "After Visit Summary". ? ?RANGE OF MOTION AND STRENGTHENING EXERCISES  ?These exercises are designed to help you keep full movement of your hip joint. Follow your caregiver's or physical therapist's instructions. Perform all exercises about fifteen times, three times per day or as directed. Exercise both hips, even if you have had only one joint replacement. These exercises can be done on a training (exercise) mat, on the floor, on a table or on a bed. Use whatever works the best and is most comfortable for you. Use music or television while you are exercising so that the exercises  are a pleasant break in your day. This will make your life better with the exercises acting as a break in routine you can look forward to.  ?Lying on your back, slowly slide your foot toward your buttocks, raising your knee up off the floor. Then slowly slide your foot back down until your leg is straight again.  ?Lying on your back spread your legs as far apart as you can without causing discomfort.  ?Lying on your side, raise your upper leg and foot straight up from the floor as far as is comfortable. Slowly lower the leg and repeat.  ?Lying on your back, tighten up the muscle in the front of your thigh (quadriceps muscles). You can do this by keeping your leg straight and trying to raise your heel off the floor. This helps strengthen the largest muscle supporting your knee.  ?Lying on your back, tighten up the muscles of your buttocks both with the legs straight and with the knee bent at a comfortable angle while keeping your heel on the floor.  ? ? ? ? ?IF YOU ARE TRANSFERRED TO A SKILLED REHAB FACILITY ?If the patient is transferred to a skilled rehab facility following release from the  hospital, a list of the current medications will be sent to the facility for the patient to continue.  When discharged from the skilled rehab facility, please have the facility set up the patient's [place]

## 2021-06-08 NOTE — Plan of Care (Signed)

## 2021-06-09 DIAGNOSIS — W19XXXA Unspecified fall, initial encounter: Secondary | ICD-10-CM | POA: Diagnosis not present

## 2021-06-09 DIAGNOSIS — S72002A Fracture of unspecified part of neck of left femur, initial encounter for closed fracture: Secondary | ICD-10-CM | POA: Diagnosis not present

## 2021-06-09 DIAGNOSIS — I5022 Chronic systolic (congestive) heart failure: Secondary | ICD-10-CM | POA: Diagnosis not present

## 2021-06-09 DIAGNOSIS — I10 Essential (primary) hypertension: Secondary | ICD-10-CM | POA: Diagnosis not present

## 2021-06-09 LAB — URINALYSIS, ROUTINE W REFLEX MICROSCOPIC
Bilirubin Urine: NEGATIVE
Glucose, UA: NEGATIVE mg/dL
Ketones, ur: NEGATIVE mg/dL
Nitrite: NEGATIVE
Protein, ur: 100 mg/dL — AB
Specific Gravity, Urine: 1.023 (ref 1.005–1.030)
pH: 5 (ref 5.0–8.0)

## 2021-06-09 LAB — CBC WITH DIFFERENTIAL/PLATELET
Abs Immature Granulocytes: 0.07 10*3/uL (ref 0.00–0.07)
Basophils Absolute: 0 10*3/uL (ref 0.0–0.1)
Basophils Relative: 0 %
Eosinophils Absolute: 0 10*3/uL (ref 0.0–0.5)
Eosinophils Relative: 0 %
HCT: 28.2 % — ABNORMAL LOW (ref 36.0–46.0)
Hemoglobin: 9.1 g/dL — ABNORMAL LOW (ref 12.0–15.0)
Immature Granulocytes: 1 %
Lymphocytes Relative: 12 %
Lymphs Abs: 1.2 10*3/uL (ref 0.7–4.0)
MCH: 31.8 pg (ref 26.0–34.0)
MCHC: 32.3 g/dL (ref 30.0–36.0)
MCV: 98.6 fL (ref 80.0–100.0)
Monocytes Absolute: 0.7 10*3/uL (ref 0.1–1.0)
Monocytes Relative: 7 %
Neutro Abs: 7.9 10*3/uL — ABNORMAL HIGH (ref 1.7–7.7)
Neutrophils Relative %: 80 %
Platelets: 144 10*3/uL — ABNORMAL LOW (ref 150–400)
RBC: 2.86 MIL/uL — ABNORMAL LOW (ref 3.87–5.11)
RDW: 13.8 % (ref 11.5–15.5)
WBC: 9.8 10*3/uL (ref 4.0–10.5)
nRBC: 0 % (ref 0.0–0.2)

## 2021-06-09 LAB — COMPREHENSIVE METABOLIC PANEL
ALT: 92 U/L — ABNORMAL HIGH (ref 0–44)
AST: 182 U/L — ABNORMAL HIGH (ref 15–41)
Albumin: 3.4 g/dL — ABNORMAL LOW (ref 3.5–5.0)
Alkaline Phosphatase: 44 U/L (ref 38–126)
Anion gap: 10 (ref 5–15)
BUN: 31 mg/dL — ABNORMAL HIGH (ref 8–23)
CO2: 19 mmol/L — ABNORMAL LOW (ref 22–32)
Calcium: 7.5 mg/dL — ABNORMAL LOW (ref 8.9–10.3)
Chloride: 106 mmol/L (ref 98–111)
Creatinine, Ser: 1.38 mg/dL — ABNORMAL HIGH (ref 0.44–1.00)
GFR, Estimated: 39 mL/min — ABNORMAL LOW (ref >60)
Glucose, Bld: 110 mg/dL — ABNORMAL HIGH (ref 70–99)
Potassium: 4 mmol/L (ref 3.5–5.1)
Sodium: 135 mmol/L (ref 135–145)
Total Bilirubin: 0.7 mg/dL (ref 0.3–1.2)
Total Protein: 6.4 g/dL — ABNORMAL LOW (ref 6.5–8.1)

## 2021-06-09 LAB — STREP PNEUMONIAE URINARY ANTIGEN: Strep Pneumo Urinary Antigen: NEGATIVE

## 2021-06-09 LAB — LACTIC ACID, PLASMA: Lactic Acid, Venous: 1.2 mmol/L (ref 0.5–1.9)

## 2021-06-09 LAB — CK: Total CK: 1365 U/L — ABNORMAL HIGH (ref 38–234)

## 2021-06-09 LAB — PROCALCITONIN: Procalcitonin: 0.42 ng/mL

## 2021-06-09 MED ORDER — SODIUM CHLORIDE 0.9 % IV SOLN
2.0000 g | INTRAVENOUS | Status: DC
  Administered 2021-06-09: 2 g via INTRAVENOUS
  Filled 2021-06-09: qty 20

## 2021-06-09 MED ORDER — ACETAMINOPHEN 325 MG PO TABS
650.0000 mg | ORAL_TABLET | Freq: Four times a day (QID) | ORAL | Status: DC | PRN
  Administered 2021-06-09 – 2021-06-15 (×14): 650 mg via ORAL
  Filled 2021-06-09 (×14): qty 2

## 2021-06-09 MED ORDER — DOXYCYCLINE HYCLATE 100 MG PO TABS
100.0000 mg | ORAL_TABLET | Freq: Two times a day (BID) | ORAL | Status: DC
Start: 2021-06-09 — End: 2021-06-11
  Administered 2021-06-09 – 2021-06-11 (×4): 100 mg via ORAL
  Filled 2021-06-09 (×4): qty 1

## 2021-06-09 MED ORDER — OYSTER SHELL CALCIUM/D3 500-5 MG-MCG PO TABS
1.0000 | ORAL_TABLET | Freq: Every day | ORAL | Status: DC
  Administered 2021-06-10 – 2021-06-15 (×4): 1 via ORAL
  Filled 2021-06-09 (×5): qty 1

## 2021-06-09 MED ORDER — MIDODRINE HCL 5 MG PO TABS: 5.0000 mg | ORAL_TABLET | Freq: Three times a day (TID) | ORAL | Status: DC

## 2021-06-09 MED ORDER — SODIUM CHLORIDE 0.9 % IV BOLUS
250.0000 mL | Freq: Once | INTRAVENOUS | Status: AC
Start: 2021-06-09 — End: 2021-06-09
  Administered 2021-06-09: 250 mL via INTRAVENOUS

## 2021-06-09 MED ORDER — ENSURE MAX PROTEIN PO LIQD
11.0000 [oz_av] | Freq: Every day | ORAL | Status: DC
  Administered 2021-06-10 – 2021-06-11 (×2): 11 [oz_av] via ORAL
  Filled 2021-06-09: qty 330

## 2021-06-09 MED ORDER — SODIUM CHLORIDE 0.9 % IV SOLN
100.0000 mg | Freq: Two times a day (BID) | INTRAVENOUS | Status: DC
  Administered 2021-06-09: 100 mg via INTRAVENOUS
  Filled 2021-06-09 (×2): qty 100

## 2021-06-09 NOTE — Progress Notes (Signed)
?   06/08/21 2108 06/08/21 2302 06/09/21 0055  ?Assess: MEWS Score  ?Temp 98.7 ?F (37.1 ?C) 97.7 ?F (36.5 ?C)  --   ?BP (!) 82/47 (!) 91/43 (!) 101/47  ?Pulse Rate 84 85 76  ?Resp (!) 22 18 (!) 24  ?Level of Consciousness Alert  --   --   ?SpO2 96 % 94 % (!) 68 %  ?O2 Device Nasal Cannula Nasal Cannula Nasal Cannula  ?O2 Flow Rate (L/min) 3 L/min 3 L/min  --   ? ? 06/09/21 0056  ?Assess: MEWS Score  ?Temp  --   ?BP  --   ?Pulse Rate 83  ?Resp  --   ?Level of Consciousness  --   ?SpO2 100 %  ?O2 Device Nasal Cannula  ?O2 Flow Rate (L/min) 3 L/min  ? ? ?

## 2021-06-09 NOTE — Progress Notes (Signed)
Physical Therapy Treatment ?Patient Details ?Name: [name] ?MRN: [id number] ?DOB: 09-09-1940 ?Today's Date: 06/09/2021 ? ? ?History of Present Illness Pt is a 81 y.o. female presenting to hospital 4/4 s/p fall (lost balance walking without walker use).  Imaging showing acute comminuted displaced impacted L femoral neck fx; age-indeterminate fx of the R posterolateral 4th rib with mild displacement; multiple remote appearing thoracic compression fx's (new at T5 and T6 since prior imaging).  Pt admitted with closed L hip fx and NSTEMI.  S/p L hip hemiarthroplasty 4/6.  PMH includes CAD, anxiety, OA, ischemic cariomyopathy, L BBB, moderate-severe mitral regurgitation, UTI, CHF, Hepatitis C, chronic back pain, chronic fatigue, back sx. ? ?  ?PT Comments  ? ? Pt was long sitting in bed upon arriving. She required max encouragement but eventually agreeable. Pt has been self limiting and is extremely anxious about movements/activity. She was able to actively move BLEs however elects not to due to p[ain. Max assisted to EOB. + 2 assist to stand and pivot to recliner. Fear mostly limiting pt in standing activity. She was repositioned in recliner and then performed HEP x 5 reps each. Highly recommend +2 assistance for all mobility, transfers, and pre-gait. Pt does endorse sleeping in recliner at home. She will require extensive PT going forward to address deficits while assisting pt to PLOF. Highly recommend DC to SNF.  ?  ?Recommendations for follow up therapy are one component of a multi-disciplinary discharge planning process, led by the attending physician.  Recommendations may be updated based on patient status, additional functional criteria and insurance authorization. ? ?Follow Up Recommendations ? Skilled nursing-short term rehab (<3 hours/day) ?  ?  ?Assistance Recommended at Discharge Frequent or constant Supervision/Assistance  ?Patient can return home with the following Two people to help with walking and/or  transfers;Two people to help with bathing/dressing/bathroom;Assistance with cooking/housework;Direct supervision/assist for financial management;Assist for transportation;Help with stairs or ramp for entrance ?  ?Equipment Recommendations ? Rolling walker (2 wheels);BSC/3in1;Wheelchair (measurements PT);Wheelchair cushion (measurements PT);Hospital bed;Other (comment)  ?  ?   ?Precautions / Restrictions Precautions ?Precautions: Posterior Hip;Fall ?Precaution Booklet Issued: Yes (comment) ?Restrictions ?Weight Bearing Restrictions: Yes ?LLE Weight Bearing: Weight bearing as tolerated  ?  ? ?Mobility ? Bed Mobility ?Overal bed mobility: Needs Assistance ?Bed Mobility: Supine to Sit ?  ?  ?Supine to sit: Max assist, Total assist, HOB elevated ?  ?  ?General bed mobility comments: pt likes to yell out "stop" "no" during session with any/all movements. She is able to actively move BLEs however but due to time, max assisted to EOB due to pain ?  ? ?Transfers ?Overall transfer level: Needs assistance ?Equipment used: Rolling walker (2 wheels) ?Transfers: Sit to/from Stand, Bed to chair/wheelchair/BSC ?Sit to Stand: Max assist, From elevated surface, +2 safety/equipment ?Stand pivot transfers: Max assist, +2 safety/equipment, +2 physical assistance ?  ?  ?  ?  ?General transfer comment: pt was able to stand pivot to recliner with +2 assistance for safety ?  ? ?Ambulation/Gait ?  ?   ?General Gait Details: pt is unwilling to attempt. overall self limiting due to anxiousness/fear/pain ? ?  ?Balance Overall balance assessment: Needs assistance ?Sitting-balance support: Bilateral upper extremity supported, Feet supported ?Sitting balance-Leahy Scale: Poor ?Sitting balance - Comments: poor due to effort ?  ?Standing balance support: Bilateral upper extremity supported, During functional activity, Reliant on assistive device for balance ?Standing balance-Leahy Scale: Poor ?  ?  ?  ?Cognition Arousal/Alertness:  Awake/alert ?Behavior During Therapy:  Anxious ?Overall Cognitive Status: Within Functional Limits for tasks assessed ?  ?   ?General Comments: resistant to participation despite max encouragement/education during session ?  ?  ? ?  ?   ?General Comments General comments (skin integrity, edema, etc.): reviewed and pt performed 5 reps of HEP handout. ?  ?  ? ?Pertinent Vitals/Pain Pain Assessment ?Pain Assessment: 0-10 ?Pain Score: 9  ?Faces Pain Scale: Hurts whole lot ?Pain Location: L hip ?Pain Descriptors / Indicators: Grimacing, Discomfort ?Pain Intervention(s): Limited activity within patient's tolerance, Monitored during session, Repositioned, Ice applied (narcan required friday)  ? ? ?Home Living Family/patient expects to be discharged to:: Private residence ?Living Arrangements: Spouse/significant other ?Available Help at Discharge: Family ?Type of Home: House ?Home Access: Level entry ?  ?  ?  ?Home Layout: One level ?Home Equipment: Rollator (4 wheels);Cane - single point;Shower seat - built in;Grab bars - tub/shower;Grab bars - toilet ?   ?  ?Prior Function    ?  ?  ?   ? ?PT Goals (current goals can now be found in the care plan section) Acute Rehab PT Goals ?Patient Stated Goal: decreased pain ?Progress towards PT goals: Progressing toward goals (limited progress due to pain/anxiety/self limiting) ? ?  ?Frequency ? ? ? BID ? ? ? ?  ?PT Plan Current plan remains appropriate  ? ? ?   ?AM-PAC PT "6 Clicks" Mobility   ?Outcome Measure ? Help needed turning from your back to your side while in a flat bed without using bedrails?: A Lot ?Help needed moving from lying on your back to sitting on the side of a flat bed without using bedrails?: A Lot ?Help needed moving to and from a bed to a chair (including a wheelchair)?: A Lot ?Help needed standing up from a chair using your arms (e.g., wheelchair or bedside chair)?: A Lot ?Help needed to walk in hospital room?: Total ?Help needed climbing 3-5 steps with a  railing? : Total ?6 Click Score: 10 ? ?  ?End of Session   ?Activity Tolerance: Other (comment);Patient limited by pain (limited by anxiety) ?Patient left: in chair;with call bell/phone within reach;with chair alarm set;with family/visitor present;with nursing/sitter in room;with SCD's reapplied ?Nurse Communication: Mobility status ?PT Visit Diagnosis: Muscle weakness (generalized) (M62.81);Difficulty in walking, not elsewhere classified (R26.2);Other abnormalities of gait and mobility (R26.89);Pain;Unsteadiness on feet (R26.81) ?Pain - Right/Left: Left ?Pain - part of body: Hip ?  ? ? ?Time: 3154-0086 ?PT Time Calculation (min) (ACUTE ONLY): 20 min ? ?Charges:  $Therapeutic Activity: 8-22 mins          ?          ? ?Julaine Fusi PTA ?06/09/21, 3:44 PM  ? ?

## 2021-06-09 NOTE — Progress Notes (Signed)
PT Cancellation Note ? ?Patient Details ?Name: [name] ?MRN: [id number] ?DOB: Sep 18, 1940 ? ? ?Cancelled Treatment:     Pt only tolerated sitting in recliner for ~ 30 minutes total. Assisted RN staff with getting pt back to bed. +2 max assist required.  ? ? ?Willette Pa ?06/09/2021, 4:18 PM ?

## 2021-06-09 NOTE — Progress Notes (Addendum)
? ?Progress Note ? ?Patient Name: [name] ?Date of Encounter: 06/09/2021 ? ?[facility] Cardiologist: Ida Rogue, MD  ? ?Subjective  ? ?Patient mental status at baseline. She has pain of the hip with movement. No chest pain or SOB. Discussed cardiac cath.  ? ?She was given IVF and midodrine yesterday  x 1 dose for hypotension, now improved ?Has been started on torsemide ?I've written for midodrine 5 mg TID  ? ?She is on torsemide M,W,F,S ?She still has significant volume overload  ? ? ? ?Inpatient Medications  ?  ?Scheduled Meds: ? acetaminophen  1,000 mg Oral Q8H  ? Chlorhexidine Gluconate Cloth  6 each Topical Q0600  ? citalopram  10 mg Oral Daily  ? clopidogrel  75 mg Oral Daily  ? docusate sodium  100 mg Oral BID  ? enoxaparin (LOVENOX) injection  40 mg Subcutaneous Q24H  ? metoprolol succinate  25 mg Oral Daily  ? midodrine  5 mg Oral Once  ? multivitamin with minerals  1 tablet Oral Daily  ? Ensure Max Protein  11 oz Oral Daily  ? sacubitril-valsartan  1 tablet Oral BID  ? torsemide  20 mg Oral Once per day on Mon Tue Wed Thu Fri Sat  ? ?Continuous Infusions: ? sodium chloride 75 mL/hr at 06/09/21 0039  ? cefTRIAXone (ROCEPHIN)  IV 2 g (06/09/21 0252)  ? doxycycline (VIBRAMYCIN) IV 100 mg (06/09/21 0305)  ? ?PRN Meds: ?ALPRAZolam, bisacodyl, ipratropium, magnesium hydroxide, menthol-cetylpyridinium **OR** phenol, nitroGLYCERIN, senna-docusate, sodium phosphate, traZODone  ? ?Vital Signs  ?  ?Vitals:  ? 06/08/21 2302 06/09/21 0055 06/09/21 0056 06/09/21 0519  ?BP: (!) 91/43 (!) 101/47  (!) 111/48  ?Pulse: 85 76 83 79  ?Resp: 18 (!) 24  16  ?Temp: 97.7 ?F (36.5 ?C)   98.1 ?F (36.7 ?C)  ?TempSrc: Oral     ?SpO2: 94% (!) 68% 100% 98%  ?Weight:      ? ? ?Intake/Output Summary (Last 24 hours) at 06/09/2021 0656 ?Last data filed at 06/09/2021 0240 ?Gross per 24 hour  ?Intake 3114.79 ml  ?Output 125 ml  ?Net 2989.79 ml  ? ? ?  06/05/2021  ?  4:52 PM 05/08/2021  ?  2:22 PM 03/01/2021  ? 11:23 AM  ?Last 3 Weights   ?Weight (lbs) 150 lb 147 lb 2 oz 142 lb  ?Weight (kg) 68.04 kg 66.735 kg 64.411 kg  ?   ? ?Telemetry  ?  ?NSR HR 80s - Personally Reviewed ? ?ECG  ?  ?No new - Personally Reviewed ? ?Physical Exam  ? ?GEN: No acute distress.   ?Neck: No JVD ?Cardiac: RRR,  soft systolic murmur  ?Respiratory: Clear to auscultation bilaterally. ?GI: Soft, nontender, non-distended  ?MS:1-2 + leg edema  ?Neuro:  Nonfocal  ?Psych: Normal affect  ? ?Labs  ?  ?High Sensitivity Troponin:   ?Recent Labs  ?Lab 06/06/21 ?6659 06/06/21 ?0350 06/06/21 ?1132 06/06/21 ?1321 06/08/21 ?2156  ?TROPONINIHS 926* [id number]* 1,935* 1,888* 1,177*  ?   ?Chemistry ?Recent Labs  ?Lab 06/05/21 ?1706 06/06/21 ?0350 06/08/21 ?0344 06/08/21 ?2156 06/08/21 ?2158 06/09/21 ?9357  ?NA 140   < > 137 134*  --  135  ?K 4.3   < > 3.9 3.8  --  4.0  ?CL 103   < > 103 105  --  106  ?CO2 26   < > 21* 20*  --  19*  ?GLUCOSE 159*   < > 141* 163*  --  110*  ?BUN  24*   < > 22 32*  --  31*  ?CREATININE 1.47*   < > 1.15* 1.50*  --  1.38*  ?CALCIUM 9.4   < > 7.3* 6.8*  --  7.5*  ?MG  --   --   --   --  2.4  --   ?PROT 8.2*  --   --  5.7*  --  6.4*  ?ALBUMIN 4.4  --   --  2.8*  --  3.4*  ?AST 29  --   --  137*  --  182*  ?ALT 19  --   --  78*  --  92*  ?ALKPHOS 54  --   --  49  --  44  ?BILITOT 1.2  --   --  0.6  --  0.7  ?GFRNONAA 36*   < > 48* 35*  --  39*  ?ANIONGAP 11   < > 13 9  --  10  ? < > = values in this interval not displayed.  ?  ?Lipids No results for input(s): CHOL, TRIG, HDL, LABVLDL, LDLCALC, CHOLHDL in the last 168 hours.  ?Hematology ?Recent Labs  ?Lab 06/08/21 ?0344 06/08/21 ?2156 06/09/21 ?2094  ?WBC 10.1 11.3* 9.8  ?RBC 3.29* 2.91* 2.86*  ?HGB 10.6* 9.1* 9.1*  ?HCT 32.5* 28.6* 28.2*  ?MCV 98.8 98.3 98.6  ?MCH 32.2 31.3 31.8  ?MCHC 32.6 31.8 32.3  ?RDW 13.2 13.8 13.8  ?PLT 168 169 144*  ? ?Thyroid No results for input(s): TSH, FREET4 in the last 168 hours.  ?BNP ?Recent Labs  ?Lab 06/05/21 ?1706 06/06/21 ?0350  ?BNP 820.3* 1,590.0*  ?  ?DDimer No results for  input(s): DDIMER in the last 168 hours.  ? ?Radiology  ?  ?CT ABDOMEN PELVIS WO CONTRAST ? ?Result Date: 06/09/2021 ?CLINICAL DATA:  Liver failure and back pain. EXAM: CT ABDOMEN AND PELVIS WITHOUT CONTRAST TECHNIQUE: Multidetector CT imaging of the abdomen and pelvis was performed following the standard protocol without IV contrast. RADIATION DOSE REDUCTION: This exam was performed according to the departmental dose-optimization program which includes automated exposure control, adjustment of the mA and/or kV according to patient size and/or use of iterative reconstruction technique. COMPARISON:  None. FINDINGS: Lower chest: There is mild to moderate severity cardiomegaly with dilatation of the left ventricle. Mild atelectatic changes are seen within the bilateral lung bases. Small bilateral pleural effusions are noted. Hepatobiliary: No focal liver abnormality is seen. The gallbladder is moderately distended without evidence of gallstones, gallbladder wall thickening, or biliary dilatation. Pancreas: Unremarkable. No pancreatic ductal dilatation or surrounding inflammatory changes. Spleen: Normal in size without focal abnormality. Adrenals/Urinary Tract: Adrenal glands are unremarkable. The left kidney is atrophic in appearance. Mild compensatory hypertrophy of the right kidney is seen. There is no evidence of renal calculi, focal lesion, or hydronephrosis. A tiny focus of air is seen within the urinary bladder lumen, likely secondary to recent Foley catheterization. Stomach/Bowel: There is a small hiatal hernia. The appendix is not clearly identified. No evidence of bowel wall thickening, distention, or inflammatory changes. Vascular/Lymphatic: Aortic atherosclerosis. No enlarged abdominal or pelvic lymph nodes. Reproductive: Uterus and bilateral adnexa are unremarkable. Other: No abdominal wall hernia or abnormality. No abdominopelvic ascites. Musculoskeletal: A moderate amount of intramuscular and subcutaneous  soft tissue air is seen along the lateral aspect of the left hip, medial aspect of the proximal left femur and posterolateral aspect of the left pelvic wall. A recently placed total left hip replacement is seen with associated  streak artifact and subsequently limited evaluation of the adjacent osseous and soft tissue structures. Compression fracture deformities are seen involving the T10, L1 and L2 vertebral bodies. Prior vertebroplasty is noted at T10. Additional fracture deformities of indeterminate age are noted along the superior endplates of the L3 and L4 vertebral bodies. IMPRESSION: 1. Findings consistent with recent total left hip replacement and associated postoperative changes within the adjacent soft tissues. 2. Multiple fracture deformities within the thoracic and lumbar spine, as described above. Correlation with MRI is recommended. 3. Small bilateral pleural effusions. 4. Mild to moderate severity cardiomegaly with dilatation of the left ventricle. 5. Small hiatal hernia. 6. Atrophic left kidney with compensatory hypertrophy of the right kidney. 7. Aortic atherosclerosis. Aortic Atherosclerosis (ICD10-I70.0). Electronically Signed   By: Virgina Norfolk M.D.   On: 06/09/2021 00:20  ? ?CT HEAD WO CONTRAST (5MM) ? ?Result Date: 06/08/2021 ?CLINICAL DATA:  Altered mental status EXAM: CT HEAD WITHOUT CONTRAST TECHNIQUE: Contiguous axial images were obtained from the base of the skull through the vertex without intravenous contrast. RADIATION DOSE REDUCTION: This exam was performed according to the departmental dose-optimization program which includes automated exposure control, adjustment of the mA and/or kV according to patient size and/or use of iterative reconstruction technique. COMPARISON:  06/05/2021 FINDINGS: Brain: No evidence of acute infarction, hemorrhage, hydrocephalus, extra-axial collection or mass lesion/mass effect. Chronic atrophic and ischemic changes are again seen similar to that noted 3  days previous. Vascular: No hyperdense vessel or unexpected calcification. Skull: Normal. Negative for fracture or focal lesion. Sinuses/Orbits: Orbits are within normal limits. Opacification of the left sphen

## 2021-06-09 NOTE — Plan of Care (Signed)

## 2021-06-09 NOTE — Evaluation (Signed)
Occupational Therapy Evaluation ?Patient Details ?Name: [name] ?MRN: [id number] ?DOB: [birth year] ?Today's Date: 06/09/2021 ? ? ?History of Present Illness Pt is a 81 y.o. female presenting to hospital 4/4 s/p fall (lost balance walking without walker use).  Imaging showing acute comminuted displaced impacted L femoral neck fx; age-indeterminate fx of the R posterolateral 4th rib with mild displacement; multiple remote appearing thoracic compression fx's (new at T5 and T6 since prior imaging).  Pt admitted with closed L hip fx and NSTEMI.  S/p L hip hemiarthroplasty 4/6.  PMH includes CAD, anxiety, OA, ischemic cariomyopathy, L BBB, moderate-severe mitral regurgitation, UTI, CHF, Hepatitis C, chronic back pain, chronic fatigue, back sx.  ? ?Clinical Impression ?  ?Pt seen for OT evaluation this date.  Pt lives at home with her husband.  She was able to complete basic self care tasks prior to admission with modified independence.  Husband performed all IADL tasks and driving.  Pt now presents with muscle weakness, pain, decreased transfers, functional mobility and decreased ability to perform self care tasks.  She is now s/p left hip hemiarthroplasty with posterior hip precautions.  Husband reports he is unable to care for patient at her current level of function, she would likely benefit from STR.  Pt would benefit from skilled OT intervention to maximize her safety and independence in necessary daily tasks and to reduce caregiver burden.   ?   ? ?Recommendations for follow up therapy are one component of a multi-disciplinary discharge planning process, led by the attending physician.  Recommendations may be updated based on patient status, additional functional criteria and insurance authorization.  ? ?Follow Up Recommendations ? Skilled nursing-short term rehab (<3 hours/day)  ?  ?Assistance Recommended at Discharge Frequent or constant Supervision/Assistance  ?Patient can return home with the following Two  people to help with walking and/or transfers;A lot of help with bathing/dressing/bathroom;Assistance with cooking/housework;Assist for transportation;A lot of help with walking and/or transfers ? ?  ?Functional Status Assessment ? Patient has had a recent decline in their functional status and demonstrates the ability to make significant improvements in function in a reasonable and predictable amount of time.  ?Equipment Recommendations ? BSC/3in1  ?  ?Recommendations for Other Services   ? ? ?  ?Precautions / Restrictions Precautions ?Precautions: Posterior Hip;Fall ?Restrictions ?Weight Bearing Restrictions: Yes ?LLE Weight Bearing: Weight bearing as tolerated  ? ?  ? ?Mobility Bed Mobility ?Overal bed mobility: Needs Assistance ?Bed Mobility: Supine to Sit, Sit to Supine ?  ?  ?  ?  ?  ?  ?  ? ?Transfers ?  ?  ?  ?  ?  ?  ?  ?  ?  ?General transfer comment: Pt declined OOB transfer with OT due to pain and did transfer earlier with PT ?  ? ?  ?Balance Overall balance assessment: Needs assistance ?Sitting-balance support: Bilateral upper extremity supported, Feet supported ?Sitting balance-Leahy Scale: Poor ?  ?  ?  ?  ?  ?  ?  ?  ?  ?  ?  ?  ?  ?  ?  ?  ?   ? ?ADL either performed or assessed with clinical judgement  ? ?ADL Overall ADL's : Needs assistance/impaired ?Eating/Feeding: Set up;Sitting ?  ?Grooming: Set up;Sitting ?Grooming Details (indicate cue type and reason): unable to perform in standing at eval ?Upper Body Bathing: Set up ?  ?Lower Body Bathing: Maximal assistance ?Lower Body Bathing Details (indicate cue type and reason): with posterior hip  precautions ?Upper Body Dressing : Set up ?  ?Lower Body Dressing: Maximal assistance ?Lower Body Dressing Details (indicate cue type and reason): posterior hip precautions ?Toilet Transfer: +2 for physical assistance ?  ?Toileting- Clothing Manipulation and Hygiene: +2 for physical assistance ?  ?  ?  ?Functional mobility during ADLs: +2 for physical  assistance ?General ADL Comments: Pt limited by pain this date, requires +2 for transfers, max to dependent for lower body self care tasks.  ? ? ? ?Vision Baseline Vision/History: 1 Wears glasses ?Patient Visual Report: No change from baseline ?   ?   ?Perception   ?  ?Praxis   ?  ? ?Pertinent Vitals/Pain Pain Assessment ?Pain Assessment: 0-10 ?Pain Score: 8  ?Pain Location: L hip ?Pain Descriptors / Indicators: Grimacing, Discomfort ?Pain Intervention(s): Limited activity within patient's tolerance, Monitored during session  ? ? ? ?Hand Dominance   ?  ?Extremity/Trunk Assessment Upper Extremity Assessment ?Upper Extremity Assessment: Generalized weakness ?  ?Lower Extremity Assessment ?Lower Extremity Assessment: Defer to PT evaluation ?  ?  ?  ?Communication Communication ?Communication: No difficulties ?  ?Cognition Arousal/Alertness: Awake/alert ?  ?Overall Cognitive Status: Within Functional Limits for tasks assessed ?  ?  ?  ?  ?  ?  ?  ?  ?  ?  ?  ?  ?  ?  ?  ?  ?  ?  ?  ?General Comments    ? ?  ?Exercises   ?  ?Shoulder Instructions    ? ? ?Home Living Family/patient expects to be discharged to:: Private residence ?Living Arrangements: Spouse/significant other ?Available Help at Discharge: Family ?Type of Home: House ?Home Access: Level entry ?  ?  ?Home Layout: One level ?  ?  ?Bathroom Shower/Tub: Walk-in shower ?  ?Bathroom Toilet: Handicapped height ?  ?  ?Home Equipment: Rollator (4 wheels);Cane - single point;Shower seat - built in;Grab bars - tub/shower;Grab bars - toilet ?  ?  ?  ? ?  ?Prior Functioning/Environment Prior Level of Function : Independent/Modified Independent ?  ?  ?  ?  ?  ?  ?Mobility Comments: Modified independent ambulating with rollator. ?  ?  ? ?  ?  ?OT Problem List: Decreased strength;Decreased activity tolerance;Decreased knowledge of use of DME or AE;Decreased range of motion;Impaired balance (sitting and/or standing);Decreased knowledge of precautions;Pain ?  ?   ?OT  Treatment/Interventions: Self-care/ADL training;Balance training;Therapeutic exercise;DME and/or AE instruction;Therapeutic activities;Patient/family education  ?  ?OT Goals(Current goals can be found in the care plan section) Acute Rehab OT Goals ?Patient Stated Goal: Pt would like to be able to walk again, take care of self and go home eventually with husband ?OT Goal Formulation: With patient/family ?Time For Goal Achievement: 06/23/21 ?Potential to Achieve Goals: Good ?ADL Goals ?Pt Will Perform Lower Body Dressing: with mod assist ?Pt Will Transfer to Toilet: with mod assist  ?OT Frequency: Min 2X/week ?  ? ?Co-evaluation   ?  ?  ?  ?  ? ?  ?AM-PAC OT "6 Clicks" Daily Activity     ?Outcome Measure Help from another person eating meals?: None ?Help from another person taking care of personal grooming?: A Little ?Help from another person toileting, which includes using toliet, bedpan, or urinal?: Total ?Help from another person bathing (including washing, rinsing, drying)?: Total ?Help from another person to put on and taking off regular upper body clothing?: A Little ?Help from another person to put on and taking off regular lower body clothing?: Total ?  6 Click Score: 13 ?  ?End of Session   ? ?Activity Tolerance: Patient limited by pain ?Patient left: in bed;with call bell/phone within reach;with bed alarm set;with family/visitor present ? ?OT Visit Diagnosis: Muscle weakness (generalized) (M62.81);Unsteadiness on feet (R26.81);Pain ?Pain - part of body: Hip  ?              ?Time: 1470-9295 ?OT Time Calculation (min): 27 min ?Charges:  OT General Charges ?$OT Visit: 1 Visit ?OT Evaluation ?$OT Eval Low Complexity: 1 Low ? ?Deveon Kisiel T Caeleigh Prohaska, OTR/L, CLT ? ? ?Zameer Borman ?06/09/2021, 1:38 PM ?

## 2021-06-09 NOTE — Progress Notes (Signed)
Physical Therapy Treatment ?Patient Details ?Name: [name] ?MRN: [id number] ?DOB: November 12, 1940 ?Today's Date: 06/09/2021 ? ? ?History of Present Illness Pt is a 81 y.o. female presenting to hospital 4/4 s/p fall (lost balance walking without walker use).  Imaging showing acute comminuted displaced impacted L femoral neck fx; age-indeterminate fx of the R posterolateral 4th rib with mild displacement; multiple remote appearing thoracic compression fx's (new at T5 and T6 since prior imaging).  Pt admitted with closed L hip fx and NSTEMI.  S/p L hip hemiarthroplasty 4/6.  PMH includes CAD, anxiety, OA, ischemic cariomyopathy, L BBB, moderate-severe mitral regurgitation, UTI, CHF, Hepatitis C, chronic back pain, chronic fatigue, back sx. ? ?  ?PT Comments  ? ? Pt seen for PT tx with husband present. PT educated pt on 3/3 posterior hip precautions as pt unable to recall.  Pt demonstrates little to no initiation of movement of any part of body towards EOB despite education/cuing/encouragement. Pt ultimately requires +2 assist for supine<>sit with HOB elevated & bed rails. Pt transferred sit<>stand with +2 assist but demonstrates poor standing posture & inability to correct; pt encouraged to take step to L along side of bed but very little ability to move LLE. Pt assisted back to bed with only c/o nausea & pain with mobility. Will continue to follow pt acutely to progress mobility as able. Continue to recommend STR upon d/c. ? ?BP checked in LUE: ?Semi fowler in bed: 106/32 mmHg MAP 58 ?Sitting EOB: 106/44 mmHg MAP 65 ?Semi fowler in bed: 118/35 mmHg 61 ? ?Pt on 2.5 L/min via nasal cannula throughout session ?MD made aware of initial BP in bed & cleared pt for OOB mobility. ?  ?Recommendations for follow up therapy are one component of a multi-disciplinary discharge planning process, led by the attending physician.  Recommendations may be updated based on patient status, additional functional criteria and insurance  authorization. ? ?Follow Up Recommendations ? Skilled nursing-short term rehab (<3 hours/day) ?  ?  ?Assistance Recommended at Discharge Frequent or constant Supervision/Assistance  ?Patient can return home with the following Two people to help with walking and/or transfers;Two people to help with bathing/dressing/bathroom;Assistance with cooking/housework;Direct supervision/assist for financial management;Assist for transportation;Help with stairs or ramp for entrance ?  ?Equipment Recommendations ? Rolling walker (2 wheels);BSC/3in1;Wheelchair (measurements PT);Wheelchair cushion (measurements PT);Hospital bed;Other (comment) (hoyer lift)  ?  ?Recommendations for Other Services   ? ? ?  ?Precautions / Restrictions Precautions ?Precautions: Posterior Hip;Fall ?Restrictions ?Weight Bearing Restrictions: Yes ?LLE Weight Bearing: Weight bearing as tolerated  ?  ? ?Mobility ? Bed Mobility ?Overal bed mobility: Needs Assistance ?Bed Mobility: Supine to Sit, Sit to Supine ?  ?  ?Supine to sit: Max assist, +2 for physical assistance, HOB elevated ?Sit to supine: Max assist, +2 for physical assistance, HOB elevated ?  ?General bed mobility comments: use of bed rails, pt requires cuing for overall technique but with lmited willingness to initiate or attempt movement, even requiring assistance for moving RLE to EOB ?  ? ?Transfers ?Overall transfer level: Needs assistance ?Equipment used: Rolling walker (2 wheels) ?Transfers: Sit to/from Stand ?Sit to Stand: Max assist, +2 physical assistance ?  ?  ?  ?  ?  ?General transfer comment: STS from EOB with max cuing but poor return demo of safe hand placement during STS transfer ?  ? ?Ambulation/Gait ?  ?  ?  ?  ?  ?  ?  ?  ? ? ?Stairs ?  ?  ?  ?  ?  ? ? ?  Wheelchair Mobility ?  ? ?Modified Rankin (Stroke Patients Only) ?  ? ? ?  ?Balance Overall balance assessment: Needs assistance ?Sitting-balance support: Bilateral upper extremity supported, Feet supported ?Sitting  balance-Leahy Scale: Poor ?Sitting balance - Comments: min assist static sitting, leans to R to decrease weight bearing through L hip ?Postural control: Posterior lean ?Standing balance support: Bilateral upper extremity supported, During functional activity, Reliant on assistive device for balance ?Standing balance-Leahy Scale: Zero ?Standing balance comment: BUE support on RW, unable to shift pelvis anteriorly despite cuing to do so, as well as cuing for upright posture pt continues to lean forward flexed onto RW ?  ?  ?  ?  ?  ?  ?  ?  ?  ?  ?  ?  ? ?  ?Cognition Arousal/Alertness: Awake/alert ?Behavior During Therapy:  (irritable) ?Overall Cognitive Status: Within Functional Limits for tasks assessed ?  ?  ?  ?  ?  ?  ?  ?  ?  ?  ?  ?  ?  ?  ?  ?  ?General Comments: resistant to participation despite max encouragement/education during session ?  ?  ? ?  ?Exercises General Exercises - Lower Extremity ?Long Arc Quad: AROM, Strengthening, Left, Seated (3 repetitions) ? ?  ?General Comments   ?  ?  ? ?Pertinent Vitals/Pain Pain Assessment ?Pain Assessment: Faces ?Faces Pain Scale: Hurts whole lot ?Pain Location: L hip ?Pain Descriptors / Indicators: Grimacing, Discomfort ?Pain Intervention(s): Limited activity within patient's tolerance, Monitored during session, Repositioned  ? ? ?Home Living   ?  ?  ?  ?  ?  ?  ?  ?  ?  ?   ?  ?Prior Function    ?  ?  ?   ? ?PT Goals (current goals can now be found in the care plan section) Acute Rehab PT Goals ?Patient Stated Goal: decreased pain ?PT Goal Formulation: With patient ?Time For Goal Achievement: 06/22/21 ?Potential to Achieve Goals: Fair ?Progress towards PT goals: Progressing toward goals ? ?  ?Frequency ? ? ? BID ? ? ? ?  ?PT Plan Current plan remains appropriate  ? ? ?Co-evaluation   ?  ?  ?  ?  ? ?  ?AM-PAC PT "6 Clicks" Mobility   ?Outcome Measure ? Help needed turning from your back to your side while in a flat bed without using bedrails?: A Lot ?Help needed  moving from lying on your back to sitting on the side of a flat bed without using bedrails?: Total ?Help needed moving to and from a bed to a chair (including a wheelchair)?: Total ?Help needed standing up from a chair using your arms (e.g., wheelchair or bedside chair)?: Total ?Help needed to walk in hospital room?: Total ?Help needed climbing 3-5 steps with a railing? : Total ?6 Click Score: 7 ? ?  ?End of Session   ?Activity Tolerance: Patient limited by pain ?Patient left: in bed;with call bell/phone within reach;with bed alarm set;with family/visitor present ?Nurse Communication: Mobility status (notified MD of BP during mobility) ?PT Visit Diagnosis: Muscle weakness (generalized) (M62.81);Difficulty in walking, not elsewhere classified (R26.2);Other abnormalities of gait and mobility (R26.89);Pain;Unsteadiness on feet (R26.81) ?Pain - Right/Left: Left ?Pain - part of body: Hip ?  ? ? ?Time: 5573-2202 ?PT Time Calculation (min) (ACUTE ONLY): 30 min ? ?Charges:  $Therapeutic Activity: 23-37 mins          ?          ? ?  Lavone Nian, PT, DPT ?06/09/21, 10:09 AM ? ? ? ?Waunita Schooner ?06/09/2021, 10:05 AM ? ?

## 2021-06-09 NOTE — Progress Notes (Signed)
Patient and husband requesting telemetry monitor to be discontinued. Stating they "can't stand the beeping and can't sleep". MD notified. Will DC per pt request. ?

## 2021-06-09 NOTE — Progress Notes (Signed)
[place] [facility] ? ? ?PATIENT NAME: [name]   ? ?MR#:  [id number] ? ?DATE OF BIRTH:  November 08, 1940 ? ?SUBJECTIVE:  ? ?husband at bedside. Patient came in after mechanical fall. She usually walks with walker. Started having left hip pain. Found to have left impacted femoral neck fracture. ? ?Pt got couple boluses for hypotension. Had CT head/adbomen last pm--nothing acute ?Pt frustrated with what she went thru last pm.  ?UOP 475 cc ?Feels overall ok except neck pain ?Bp better today ? ?VITALS:  ?Blood pressure (!) 111/50, pulse 79, temperature 98 ?F (36.7 ?C), resp. rate 17, weight 68 kg, SpO2 99 %. ? ?PHYSICAL EXAMINATION:  ? ?GENERAL:  81 y.o.-year-old patient lying in the bed with no acute distress. Pallor+ ?LUNGS: Normal breath sounds bilaterally, no wheezing, rales, rhonchi.  ?CARDIOVASCULAR: S1, S2 normal. No murmurs, rubs, or gallops.  ?ABDOMEN: Soft, nontender, nondistended. Bowel sounds present.  ?EXTREMITIES: left hip ROM+ ?NEUROLOGIC: nonfocal  patient is alert and awake ?SKIN: No obvious rash, lesion, or ulcer.  ? ?LABORATORY PANEL:  ?CBC ?Recent Labs  ?Lab 06/09/21 ?5701  ?WBC 9.8  ?HGB 9.1*  ?HCT 28.2*  ?PLT 144*  ? ? ? ?Chemistries  ?Recent Labs  ?Lab 06/08/21 ?2158 06/09/21 ?7793  ?NA  --  135  ?K  --  4.0  ?CL  --  106  ?CO2  --  19*  ?GLUCOSE  --  110*  ?BUN  --  31*  ?CREATININE  --  1.38*  ?CALCIUM  --  7.5*  ?MG 2.4  --   ?AST  --  182*  ?ALT  --  92*  ?ALKPHOS  --  44  ?BILITOT  --  0.7  ? ? ?Cardiac Enzymes ?No results for input(s): TROPONINI in the last 168 hours. ?RADIOLOGY:  ?CT ABDOMEN PELVIS WO CONTRAST ? ?Result Date: 06/09/2021 ?CLINICAL DATA:  Liver failure and back pain. EXAM: CT ABDOMEN AND PELVIS WITHOUT CONTRAST TECHNIQUE: Multidetector CT imaging of the abdomen and pelvis was performed following the standard protocol without IV contrast. RADIATION DOSE REDUCTION: This exam was performed according to the departmental dose-optimization program which  includes automated exposure control, adjustment of the mA and/or kV according to patient size and/or use of iterative reconstruction technique. COMPARISON:  None. FINDINGS: Lower chest: There is mild to moderate severity cardiomegaly with dilatation of the left ventricle. Mild atelectatic changes are seen within the bilateral lung bases. Small bilateral pleural effusions are noted. Hepatobiliary: No focal liver abnormality is seen. The gallbladder is moderately distended without evidence of gallstones, gallbladder wall thickening, or biliary dilatation. Pancreas: Unremarkable. No pancreatic ductal dilatation or surrounding inflammatory changes. Spleen: Normal in size without focal abnormality. Adrenals/Urinary Tract: Adrenal glands are unremarkable. The left kidney is atrophic in appearance. Mild compensatory hypertrophy of the right kidney is seen. There is no evidence of renal calculi, focal lesion, or hydronephrosis. A tiny focus of air is seen within the urinary bladder lumen, likely secondary to recent Foley catheterization. Stomach/Bowel: There is a small hiatal hernia. The appendix is not clearly identified. No evidence of bowel wall thickening, distention, or inflammatory changes. Vascular/Lymphatic: Aortic atherosclerosis. No enlarged abdominal or pelvic lymph nodes. Reproductive: Uterus and bilateral adnexa are unremarkable. Other: No abdominal wall hernia or abnormality. No abdominopelvic ascites. Musculoskeletal: A moderate amount of intramuscular and subcutaneous soft tissue air is seen along the lateral aspect of the left hip, medial aspect of the proximal left femur and posterolateral aspect of the  left pelvic wall. A recently placed total left hip replacement is seen with associated streak artifact and subsequently limited evaluation of the adjacent osseous and soft tissue structures. Compression fracture deformities are seen involving the T10, L1 and L2 vertebral bodies. Prior vertebroplasty is  noted at T10. Additional fracture deformities of indeterminate age are noted along the superior endplates of the L3 and L4 vertebral bodies. IMPRESSION: 1. Findings consistent with recent total left hip replacement and associated postoperative changes within the adjacent soft tissues. 2. Multiple fracture deformities within the thoracic and lumbar spine, as described above. Correlation with MRI is recommended. 3. Small bilateral pleural effusions. 4. Mild to moderate severity cardiomegaly with dilatation of the left ventricle. 5. Small hiatal hernia. 6. Atrophic left kidney with compensatory hypertrophy of the right kidney. 7. Aortic atherosclerosis. Aortic Atherosclerosis (ICD10-I70.0). Electronically Signed   By: Virgina Norfolk M.D.   On: 06/09/2021 00:20  ? ?CT HEAD WO CONTRAST (5MM) ? ?Result Date: 06/08/2021 ?CLINICAL DATA:  Altered mental status EXAM: CT HEAD WITHOUT CONTRAST TECHNIQUE: Contiguous axial images were obtained from the base of the skull through the vertex without intravenous contrast. RADIATION DOSE REDUCTION: This exam was performed according to the departmental dose-optimization program which includes automated exposure control, adjustment of the mA and/or kV according to patient size and/or use of iterative reconstruction technique. COMPARISON:  06/05/2021 FINDINGS: Brain: No evidence of acute infarction, hemorrhage, hydrocephalus, extra-axial collection or mass lesion/mass effect. Chronic atrophic and ischemic changes are again seen similar to that noted 3 days previous. Vascular: No hyperdense vessel or unexpected calcification. Skull: Normal. Negative for fracture or focal lesion. Sinuses/Orbits: Orbits are within normal limits. Opacification of the left sphenoid sinus is noted. Surrounding sclerosis in the wall of the sphenoid sinus is noted suggesting a chronic sinusitis. Other: None. IMPRESSION: Chronic atrophic and ischemic changes without acute abnormality. Chronic sinusitis in the  left sphenoid sinus. Electronically Signed   By: Inez Catalina M.D.   On: 06/08/2021 22:54  ? ?DG Pelvis Portable ? ?Result Date: 06/07/2021 ?CLINICAL DATA:  Status post left hip arthroplasty. EXAM: PORTABLE PELVIS 1-2 VIEWS COMPARISON:  06/05/2021 FINDINGS: In the from projection visualized left hip hemiarthroplasty demonstrates normal alignment. No surrounding fracture. IMPRESSION: Normal alignment left hip hemiarthroplasty. Electronically Signed   By: Aletta Edouard M.D.   On: 06/07/2021 12:20  ? ?DG Chest Port 1 View ? ?Result Date: 06/08/2021 ?CLINICAL DATA:  Respiratory crackles. EXAM: PORTABLE CHEST 1 VIEW COMPARISON:  One-view chest x-ray 06/05/2021 CTA chest 06/05/2021 FINDINGS: Heart is enlarged. Atherosclerotic calcifications are present at the aortic arch. New bibasilar airspace disease is present. The lung volumes are low. Mild pulmonary vascular congestion is present. Remote right-sided rib fracture again noted. IMPRESSION: 1. Cardiomegaly and mild pulmonary vascular congestion. 2. New bibasilar airspace disease is concerning for infection. Electronically Signed   By: San Morelle M.D.   On: 06/08/2021 22:45  ? ?DG HIP PORT UNILAT WITH PELVIS 1V LEFT ? ?Result Date: 06/07/2021 ?CLINICAL DATA:  Left hip fracture EXAM: DG HIP (WITH OR WITHOUT PELVIS) 1V PORT LEFT COMPARISON:  None. FINDINGS: Intra procedural radiograph showing femoral stem in place. No evidence of intraoperative fracture when allowing for extensive artifact and soft tissue gas. IMPRESSION: Intraoperative left hip radiograph with no unexpected finding. Electronically Signed   By: Jorje Guild M.D.   On: 06/07/2021 12:36  ? ?DG HIP UNILAT WITH PELVIS 2-3 VIEWS LEFT ? ?Result Date: 06/07/2021 ?CLINICAL DATA:  Left total hip replacement EXAM: DG HIP (WITH  OR WITHOUT PELVIS) 2-3V LEFT COMPARISON:  06/07/2021, 06/05/2021 FINDINGS: There is a left hip arthroplasty in normal alignment without evidence of loosening or periprosthetic fracture.  Expected soft tissue changes. IMPRESSION: No evidence of left hip arthroplasty complication. Electronically Signed   By: Maurine Simmering M.D.   On: 06/07/2021 14:06   ? ?Assessment and Plan ?AMARYS SLIWINSKI is a

## 2021-06-09 NOTE — Progress Notes (Signed)
?  Subjective: ?2 Days Post-Op Procedure(s) (LRB): ?ARTHROPLASTY BIPOLAR HIP (HEMIARTHROPLASTY) (Left) ?Patient reports pain as moderate.  Husband at bedside. ?Plan is to go SNF vs home after hospital stay. ?Negative for chest pain and shortness of breath ?Fever: no ? ? ?Objective: ?Vital signs in last 24 hours: ?Temp:  [97.5 ?F (36.4 ?C)-98.7 ?F (37.1 ?C)] 97.5 ?F (36.4 ?C) (04/08 1609) ?Pulse Rate:  [76-91] 91 (04/08 1609) ?Resp:  [16-24] 18 (04/08 1609) ?BP: (82-129)/(43-52) 129/51 (04/08 1609) ?SpO2:  [68 %-100 %] 94 % (04/08 1609) ? ?Intake/Output from previous day: ? ?Intake/Output Summary (Last 24 hours) at 06/09/2021 1711 ?Last data filed at 06/09/2021 1409 ?Gross per 24 hour  ?Intake 1200 ml  ?Output 875 ml  ?Net 325 ml  ?  ?Intake/Output this shift: ?Total I/O ?In: 960 [P.O.:960] ?Out: 400 [Urine:400] ? ?Labs: ?Recent Labs  ?  06/07/21 ?0437 06/08/21 ?0344 06/08/21 ?2156 06/09/21 ?3662  ?HGB 11.5* 10.6* 9.1* 9.1*  ? ?Recent Labs  ?  06/08/21 ?2156 06/09/21 ?9476  ?WBC 11.3* 9.8  ?RBC 2.91* 2.86*  ?HCT 28.6* 28.2*  ?PLT 169 144*  ? ?Recent Labs  ?  06/08/21 ?2156 06/09/21 ?5465  ?NA 134* 135  ?K 3.8 4.0  ?CL 105 106  ?CO2 20* 19*  ?BUN 32* 31*  ?CREATININE 1.50* 1.38*  ?GLUCOSE 163* 110*  ?CALCIUM 6.8* 7.5*  ? ?No results for input(s): LABPT, INR in the last 72 hours. ? ? ?EXAM ?General - Patient is Alert, Appropriate, and Oriented ?Extremity - Neurovascular intact ?Dorsiflexion/Plantar flexion intact ?Compartment soft ?Dressing/Incision -clean, dry, no drainage ?Motor Function - intact, moving foot and toes well on exam.  ? ? ?Assessment/Plan: ?2 Days Post-Op Procedure(s) (LRB): ?ARTHROPLASTY BIPOLAR HIP (HEMIARTHROPLASTY) (Left) ?Principal Problem: ?  Closed left hip fracture ([place]) ?Active Problems: ?  Essential hypertension ?  Hyperlipidemia ?  Chronic systolic CHF (congestive heart failure) ([place]) ?  Non-STEMI (non-ST elevated myocardial infarction) ([place]) ?  Fall ?  Elevated troponin ? ?Estimated body mass  index is 29.29 kg/m? as calculated from the following: ?  Height as of 05/08/21: 5' (1.524 m). ?  Weight as of this encounter: 68 kg. ?Advance diet ?Up with therapy ? ? ?DVT Prophylaxis - Ted hose and foot pumps, Plavix ?Weight-Bearing as tolerated to left leg ? ?Cassell Smiles, PA-C ?[facility] ?06/09/2021, 5:11 PM ? ?

## 2021-06-10 ENCOUNTER — Encounter: Payer: Self-pay | Admitting: Orthopedic Surgery

## 2021-06-10 DIAGNOSIS — I5022 Chronic systolic (congestive) heart failure: Secondary | ICD-10-CM | POA: Diagnosis not present

## 2021-06-10 DIAGNOSIS — I10 Essential (primary) hypertension: Secondary | ICD-10-CM | POA: Diagnosis not present

## 2021-06-10 DIAGNOSIS — W19XXXA Unspecified fall, initial encounter: Secondary | ICD-10-CM | POA: Diagnosis not present

## 2021-06-10 DIAGNOSIS — E861 Hypovolemia: Secondary | ICD-10-CM

## 2021-06-10 DIAGNOSIS — S72002A Fracture of unspecified part of neck of left femur, initial encounter for closed fracture: Secondary | ICD-10-CM | POA: Diagnosis not present

## 2021-06-10 DIAGNOSIS — I9589 Other hypotension: Secondary | ICD-10-CM

## 2021-06-10 MED ORDER — TRAMADOL HCL 50 MG PO TABS
50.0000 mg | ORAL_TABLET | Freq: Four times a day (QID) | ORAL | Status: DC | PRN
  Administered 2021-06-10 – 2021-06-16 (×2): 50 mg via ORAL
  Filled 2021-06-10 (×4): qty 1

## 2021-06-10 MED ORDER — FUROSEMIDE 10 MG/ML IJ SOLN
40.0000 mg | Freq: Two times a day (BID) | INTRAMUSCULAR | Status: DC
  Administered 2021-06-10 – 2021-06-15 (×7): 40 mg via INTRAVENOUS
  Filled 2021-06-10 (×9): qty 4

## 2021-06-10 MED ORDER — LORAZEPAM 2 MG/ML IJ SOLN
0.5000 mg | Freq: Once | INTRAMUSCULAR | Status: AC
  Administered 2021-06-10: 0.5 mg via INTRAVENOUS
  Filled 2021-06-10: qty 1

## 2021-06-10 MED ORDER — MIDODRINE HCL 5 MG PO TABS
5.0000 mg | ORAL_TABLET | Freq: Three times a day (TID) | ORAL | Status: DC
  Administered 2021-06-10 – 2021-06-16 (×14): 5 mg via ORAL
  Filled 2021-06-10 (×14): qty 1

## 2021-06-10 NOTE — Progress Notes (Signed)
[place] at [facility] ? ? ?PATIENT NAME: [name]   ? ?MR#:  [id number] ? ?DATE OF BIRTH:  1940-05-16 ? ?SUBJECTIVE:  ? ?husband at bedside. Patient came in after mechanical fall. She usually walks with walker. Started having left hip pain. Found to have left impacted femoral neck fracture. ? Pt out int he chair ?Back pain. Just feels very weak ? ?VITALS:  ?Blood pressure (!) 131/55, pulse 91, temperature 98.2 ?F (36.8 ?C), resp. rate 18, weight 68 kg, SpO2 100 %. ? ?PHYSICAL EXAMINATION:  ? ?GENERAL:  81 y.o.-year-old patient lying in the bed with no acute distress. Pallor+ ?LUNGS: Normal breath sounds bilaterally, no wheezing, rales, rhonchi.  ?CARDIOVASCULAR: S1, S2 normal. No murmurs, rubs, or gallops.  ?ABDOMEN: Soft, nontender, nondistended. Bowel sounds present.  ?EXTREMITIES: left hip ROM+leg edema+ ?NEUROLOGIC: nonfocal  patient is alert and awake ?SKIN: No obvious rash, lesion, or ulcer.  ? ?LABORATORY PANEL:  ?CBC ?Recent Labs  ?Lab 06/09/21 ?2707  ?WBC 9.8  ?HGB 9.1*  ?HCT 28.2*  ?PLT 144*  ? ? ? ?Chemistries  ?Recent Labs  ?Lab 06/08/21 ?2158 06/09/21 ?8675  ?NA  --  135  ?K  --  4.0  ?CL  --  106  ?CO2  --  19*  ?GLUCOSE  --  110*  ?BUN  --  31*  ?CREATININE  --  1.38*  ?CALCIUM  --  7.5*  ?MG 2.4  --   ?AST  --  182*  ?ALT  --  92*  ?ALKPHOS  --  44  ?BILITOT  --  0.7  ? ? ?Cardiac Enzymes ?No results for input(s): TROPONINI in the last 168 hours. ?RADIOLOGY:  ?CT ABDOMEN PELVIS WO CONTRAST ? ?Result Date: 06/09/2021 ?CLINICAL DATA:  Liver failure and back pain. EXAM: CT ABDOMEN AND PELVIS WITHOUT CONTRAST TECHNIQUE: Multidetector CT imaging of the abdomen and pelvis was performed following the standard protocol without IV contrast. RADIATION DOSE REDUCTION: This exam was performed according to the departmental dose-optimization program which includes automated exposure control, adjustment of the mA and/or kV according to patient size and/or use of iterative  reconstruction technique. COMPARISON:  None. FINDINGS: Lower chest: There is mild to moderate severity cardiomegaly with dilatation of the left ventricle. Mild atelectatic changes are seen within the bilateral lung bases. Small bilateral pleural effusions are noted. Hepatobiliary: No focal liver abnormality is seen. The gallbladder is moderately distended without evidence of gallstones, gallbladder wall thickening, or biliary dilatation. Pancreas: Unremarkable. No pancreatic ductal dilatation or surrounding inflammatory changes. Spleen: Normal in size without focal abnormality. Adrenals/Urinary Tract: Adrenal glands are unremarkable. The left kidney is atrophic in appearance. Mild compensatory hypertrophy of the right kidney is seen. There is no evidence of renal calculi, focal lesion, or hydronephrosis. A tiny focus of air is seen within the urinary bladder lumen, likely secondary to recent Foley catheterization. Stomach/Bowel: There is a small hiatal hernia. The appendix is not clearly identified. No evidence of bowel wall thickening, distention, or inflammatory changes. Vascular/Lymphatic: Aortic atherosclerosis. No enlarged abdominal or pelvic lymph nodes. Reproductive: Uterus and bilateral adnexa are unremarkable. Other: No abdominal wall hernia or abnormality. No abdominopelvic ascites. Musculoskeletal: A moderate amount of intramuscular and subcutaneous soft tissue air is seen along the lateral aspect of the left hip, medial aspect of the proximal left femur and posterolateral aspect of the left pelvic wall. A recently placed total left hip replacement is seen with associated streak artifact and subsequently limited evaluation of the  adjacent osseous and soft tissue structures. Compression fracture deformities are seen involving the T10, L1 and L2 vertebral bodies. Prior vertebroplasty is noted at T10. Additional fracture deformities of indeterminate age are noted along the superior endplates of the L3 and L4  vertebral bodies. IMPRESSION: 1. Findings consistent with recent total left hip replacement and associated postoperative changes within the adjacent soft tissues. 2. Multiple fracture deformities within the thoracic and lumbar spine, as described above. Correlation with MRI is recommended. 3. Small bilateral pleural effusions. 4. Mild to moderate severity cardiomegaly with dilatation of the left ventricle. 5. Small hiatal hernia. 6. Atrophic left kidney with compensatory hypertrophy of the right kidney. 7. Aortic atherosclerosis. Aortic Atherosclerosis (ICD10-I70.0). Electronically Signed   By: Virgina Norfolk M.D.   On: 06/09/2021 00:20  ? ?CT HEAD WO CONTRAST (5MM) ? ?Result Date: 06/08/2021 ?CLINICAL DATA:  Altered mental status EXAM: CT HEAD WITHOUT CONTRAST TECHNIQUE: Contiguous axial images were obtained from the base of the skull through the vertex without intravenous contrast. RADIATION DOSE REDUCTION: This exam was performed according to the departmental dose-optimization program which includes automated exposure control, adjustment of the mA and/or kV according to patient size and/or use of iterative reconstruction technique. COMPARISON:  06/05/2021 FINDINGS: Brain: No evidence of acute infarction, hemorrhage, hydrocephalus, extra-axial collection or mass lesion/mass effect. Chronic atrophic and ischemic changes are again seen similar to that noted 3 days previous. Vascular: No hyperdense vessel or unexpected calcification. Skull: Normal. Negative for fracture or focal lesion. Sinuses/Orbits: Orbits are within normal limits. Opacification of the left sphenoid sinus is noted. Surrounding sclerosis in the wall of the sphenoid sinus is noted suggesting a chronic sinusitis. Other: None. IMPRESSION: Chronic atrophic and ischemic changes without acute abnormality. Chronic sinusitis in the left sphenoid sinus. Electronically Signed   By: Inez Catalina M.D.   On: 06/08/2021 22:54  ? ?DG Chest Port 1 View ? ?Result  Date: 06/08/2021 ?CLINICAL DATA:  Respiratory crackles. EXAM: PORTABLE CHEST 1 VIEW COMPARISON:  One-view chest x-ray 06/05/2021 CTA chest 06/05/2021 FINDINGS: Heart is enlarged. Atherosclerotic calcifications are present at the aortic arch. New bibasilar airspace disease is present. The lung volumes are low. Mild pulmonary vascular congestion is present. Remote right-sided rib fracture again noted. IMPRESSION: 1. Cardiomegaly and mild pulmonary vascular congestion. 2. New bibasilar airspace disease is concerning for infection. Electronically Signed   By: San Morelle M.D.   On: 06/08/2021 22:45   ? ?Assessment and Plan ?[name] is a 81 y.o. Caucasian female with medical history significant for coronary artery disease, anxiety, osteoarthritis, ischemic cardiomyopathy EF 25%, left bundle branch block, CAD/PCI to mid RCA, proximal LAD in 2016, moderate to severe MR, who presented to the ER with acute onset of accidental mechanical fall. ?Left hip x-ray showed acute comminuted and displaced, impacted left femoral neck fracture with no hip dislocation. ? ?Acute left femoral neck fracture status post mechanical fall ?-- PRN pain meds ?-- orthopedic consultation with Dr. Posey Pronto ?-- surgery will be determined pending repeat troponin levels. ?--PT/OT -- recommends rehab ?--POD #2 cont PT ?  ?Hypotension ?Leg edema ?--received IVF bolus x2, midodrine and albumin (ordered by NP) ?--uop 475 cc ?--hold cardiac meds today ?--bp improving ? ?Abnormal CXR with possible infiltrate ?--pt asymptomatic ?-no fever, wbc normal, procalcitonin 0.42--will change to po doxycycline to completed course ? ?Elevated troponin in the setting of history of CAD without acute chest pain or shortness of breath ?HFrEF know 25-30% ?Ischemic cardiomyopathy with EF of 25% ?-- patient started  on IV heparin drip per ACS protocol ?-- troponin 42-- 926 --1777-- 1935--1800--1100 ?-- continue Plavix ?-- continue metoprolol, Crestor ?-- seen by [place]  cardiology Dr. Garen Lah ?--4/6-- patient denies any chest pain. Discussed with orthopedic will start heparin drip tomorrow morning 5 AM since patient is symptom-free at present and prior to surgery troponins

## 2021-06-10 NOTE — Progress Notes (Signed)
Physical Therapy Treatment ?Patient Details ?Name: [name] ?MRN: [id number] ?DOB: [birth year] ?Today's Date: 06/10/2021 ? ? ?History of Present Illness Pt is a 81 y.o. female presenting to hospital 4/4 s/p fall (lost balance walking without walker use).  Imaging showing acute comminuted displaced impacted L femoral neck fx; age-indeterminate fx of the R posterolateral 4th rib with mild displacement; multiple remote appearing thoracic compression fx's (new at T5 and T6 since prior imaging).  Pt admitted with closed L hip fx and NSTEMI.  S/p L hip hemiarthroplasty 4/6.  PMH includes CAD, anxiety, OA, ischemic cariomyopathy, L BBB, moderate-severe mitral regurgitation, UTI, CHF, Hepatitis C, chronic back pain, chronic fatigue, back sx. ? ?  ?PT Comments  ? ? Participated in exercises as described below.  Very slow movements with lots of encouragement.  She is assisted to EOB with max a x 2.  Min a x 1 to remain sitting EOB.  She stands with mod a x 2 and is able to very slowly transfer to recliner at bedside.  Needs lots of encouragement to work through pain but overall does seem to do a bit better today.  She does remain significantly limited in mobility and SNF remains appropriate for discharge. ?  ?Recommendations for follow up therapy are one component of a multi-disciplinary discharge planning process, led by the attending physician.  Recommendations may be updated based on patient status, additional functional criteria and insurance authorization. ? ?Follow Up Recommendations ? Skilled nursing-short term rehab (<3 hours/day) ?  ?  ?Assistance Recommended at Discharge    ?Patient can return home with the following Two people to help with walking and/or transfers;Two people to help with bathing/dressing/bathroom;Assistance with cooking/housework;Direct supervision/assist for financial management;Assist for transportation;Help with stairs or ramp for entrance ?  ?Equipment Recommendations ? Rolling walker (2  wheels);BSC/3in1;Wheelchair (measurements PT);Wheelchair cushion (measurements PT);Hospital bed;Other (comment)  ?  ?Recommendations for Other Services   ? ? ?  ?Precautions / Restrictions Precautions ?Precautions: Posterior Hip;Fall ?Precaution Booklet Issued: Yes (comment) ?Precaution Comments: comp fractures ?Restrictions ?Weight Bearing Restrictions: Yes ?LLE Weight Bearing: Weight bearing as tolerated  ?  ? ?Mobility ? Bed Mobility ?Overal bed mobility: Needs Assistance ?Bed Mobility: Supine to Sit ?  ?  ?Supine to sit: Max assist, Total assist, HOB elevated, +2 for physical assistance ?  ?  ?  ?  ? ?Transfers ?Overall transfer level: Needs assistance ?Equipment used: Rolling walker (2 wheels) ?  ?Sit to Stand: Max assist, From elevated surface, +2 safety/equipment, Mod assist ?Stand pivot transfers: Mod assist, Max assist, +2 physical assistance ?  ?  ?  ?  ?  ?  ? ?Ambulation/Gait ?  ?  ?  ?  ?  ?  ?  ?General Gait Details: unable to take any true steps/gait ? ? ?Stairs ?  ?  ?  ?  ?  ? ? ?Wheelchair Mobility ?  ? ?Modified Rankin (Stroke Patients Only) ?  ? ? ?  ?Balance Overall balance assessment: Needs assistance ?Sitting-balance support: Bilateral upper extremity supported, Feet supported ?Sitting balance-Leahy Scale: Poor ?  ?  ?Standing balance support: Bilateral upper extremity supported, During functional activity, Reliant on assistive device for balance ?Standing balance-Leahy Scale: Poor ?  ?  ?  ?  ?  ?  ?  ?  ?  ?  ?  ?  ?  ? ?  ?Cognition   ?Behavior During Therapy: [facility] for tasks assessed/performed, Anxious ?Overall Cognitive Status: Within Functional Limits for tasks assessed ?  ?  ?  ?  ?  ?  ?  ?  ?  ?  ?  ?  ?  ?  ?  ?  ?  ?  ?  ? ?  ?  Exercises Other Exercises ?Other Exercises: supine AAROM BLE x 10 ? ?  ?General Comments   ?  ?  ? ?Pertinent Vitals/Pain Pain Assessment ?Pain Assessment: Faces ?Faces Pain Scale: Hurts whole lot ?Pain Location: L hip, back ?Pain Intervention(s): Limited  activity within patient's tolerance, Monitored during session, Repositioned  ? ? ?Home Living   ?  ?  ?  ?  ?  ?  ?  ?  ?  ?   ?  ?Prior Function    ?  ?  ?   ? ?PT Goals (current goals can now be found in the care plan section) Progress towards PT goals: Progressing toward goals ? ?  ?Frequency ? ? ? BID ? ? ? ?  ?PT Plan Current plan remains appropriate  ? ? ?Co-evaluation   ?  ?  ?  ?  ? ?  ?AM-PAC PT "6 Clicks" Mobility   ?Outcome Measure ? Help needed turning from your back to your side while in a flat bed without using bedrails?: A Lot ?Help needed moving from lying on your back to sitting on the side of a flat bed without using bedrails?: A Lot ?Help needed moving to and from a bed to a chair (including a wheelchair)?: A Lot ?Help needed standing up from a chair using your arms (e.g., wheelchair or bedside chair)?: A Lot ?Help needed to walk in hospital room?: Total ?Help needed climbing 3-5 steps with a railing? : Total ?6 Click Score: 10 ? ?  ?End of Session Equipment Utilized During Treatment: Gait belt ?Activity Tolerance: Patient tolerated treatment well;Patient limited by pain ?Patient left: in chair;with call bell/phone within reach;with chair alarm set;with family/visitor present;with SCD's reapplied ?Nurse Communication: Mobility status ?PT Visit Diagnosis: Muscle weakness (generalized) (M62.81);Difficulty in walking, not elsewhere classified (R26.2);Other abnormalities of gait and mobility (R26.89);Pain;Unsteadiness on feet (R26.81) ?Pain - Right/Left: Left ?Pain - part of body: Hip ?  ? ? ?Time: 3664-4034 ?PT Time Calculation (min) (ACUTE ONLY): 24 min ? ?Charges:  $Therapeutic Exercise: 8-22 mins ?$Therapeutic Activity: 8-22 mins          ?         Chesley Noon, PTA ?06/10/21, 10:16 AM ? ?

## 2021-06-10 NOTE — Progress Notes (Signed)
?  Subjective: ?3 Days Post-Op Procedure(s) (LRB): ?ARTHROPLASTY BIPOLAR HIP (HEMIARTHROPLASTY) (Left) ?Patient reports pain as  imrpoved .   ?Patient is well, and has had no acute complaints or problems ?Plan is to go [place] hospital stay. ?Negative for chest pain and shortness of breath ?Fever: no ?Gastrointestinal: negative for nausea and vomiting.  Patient has not had a bowel movement. ? ?Objective: ?Vital signs in last 24 hours: ?Temp:  [97.5 ?F (36.4 ?C)-98.5 ?F (36.9 ?C)] 98.2 ?F (36.8 ?C) (04/09 0800) ?Pulse Rate:  [89-98] 91 (04/09 0800) ?Resp:  [16-20] 18 (04/09 0800) ?BP: (116-131)/(46-56) 131/55 (04/09 0800) ?SpO2:  [94 %-100 %] 100 % (04/09 0800) ? ?Intake/Output from previous day: ? ?Intake/Output Summary (Last 24 hours) at 06/10/2021 1057 ?Last data filed at 06/10/2021 1008 ?Gross per 24 hour  ?Intake 720 ml  ?Output 700 ml  ?Net 20 ml  ?  ?Intake/Output this shift: ?Total I/O ?In: 240 [P.O.:240] ?Out: -  ? ?Labs: ?Recent Labs  ?  06/08/21 ?0344 06/08/21 ?2156 06/09/21 ?6720  ?HGB 10.6* 9.1* 9.1*  ? ?Recent Labs  ?  06/08/21 ?2156 06/09/21 ?9470  ?WBC 11.3* 9.8  ?RBC 2.91* 2.86*  ?HCT 28.6* 28.2*  ?PLT 169 144*  ? ?Recent Labs  ?  06/08/21 ?2156 06/09/21 ?9628  ?NA 134* 135  ?K 3.8 4.0  ?CL 105 106  ?CO2 20* 19*  ?BUN 32* 31*  ?CREATININE 1.50* 1.38*  ?GLUCOSE 163* 110*  ?CALCIUM 6.8* 7.5*  ? ?No results for input(s): LABPT, INR in the last 72 hours. ? ? ?EXAM ?General - Patient is Alert, Appropriate, and Oriented ?Extremity - Neurovascular intact ?Dorsiflexion/Plantar flexion intact ?Compartment soft ?Dressing/Incision -mild bloody drainage noted  ?Motor Function - intact, moving foot and toes well on exam.  ? ? ?Assessment/Plan: ?3 Days Post-Op Procedure(s) (LRB): ?ARTHROPLASTY BIPOLAR HIP (HEMIARTHROPLASTY) (Left) ?Principal Problem: ?  Closed left hip fracture ([place]) ?Active Problems: ?  Essential hypertension ?  Hyperlipidemia ?  Chronic systolic CHF (congestive heart failure)  ([place]) ?  Non-STEMI (non-ST elevated myocardial infarction) ([place]) ?  Fall ?  Elevated troponin ? ?Estimated body mass index is 29.29 kg/m? as calculated from the following: ?  Height as of 05/08/21: 5' (1.524 m). ?  Weight as of this encounter: 68 kg. ?Advance diet ?Up with therapy ? ?Patient refused bandage change today, advised she will need to have it changed in hte morning. Patient agrees.  ? ?  ? ?DVT Prophylaxis - Ted hose and SCDs, Plavix ?Weight-Bearing as tolerated to left leg ? ?Cassell Smiles, PA-C ?[facility] Orthopaedic Surgery ?06/10/2021, 10:57 AM ? ?

## 2021-06-10 NOTE — Plan of Care (Signed)

## 2021-06-10 NOTE — Progress Notes (Signed)
? ?Progress Note ? ?Patient Name: [name] ?Date of Encounter: 06/10/2021 ? ?[facility] Cardiologist: Ida Rogue, MD  ? ?Subjective  ? ?Patient mental status at baseline. She has pain of the hip with movement. No chest pain or SOB. Discussed cardiac cath.  ? ?She was given IVF and midodrine yesterday  x 1 dose for hypotension, now improved ?Has been started on torsemide ?I've written for midodrine 5 mg TID on Saturday  ? ?She is on torsemide 20 mg daily ( except Sundays )  ?She still has significant volume overload  ? ?She is 5 liters +  ? ?I have discussed with Dr. Posey Pronto.   We will continue the midodrine 5 TID and give lasix 40 IV BID for several days to try to diurese her .  The midodrine should help [place] avoid hypotension as we diurese here  ? ? ?Inpatient Medications  ?  ?Scheduled Meds: ? calcium-vitamin D  1 tablet Oral Q breakfast  ? Chlorhexidine Gluconate Cloth  6 each Topical Q0600  ? citalopram  10 mg Oral Daily  ? clopidogrel  75 mg Oral Daily  ? docusate sodium  100 mg Oral BID  ? doxycycline  100 mg Oral Q12H  ? enoxaparin (LOVENOX) injection  40 mg Subcutaneous Q24H  ? metoprolol succinate  25 mg Oral Daily  ? multivitamin with minerals  1 tablet Oral Daily  ? Ensure Max Protein  11 oz Oral Daily  ? sacubitril-valsartan  1 tablet Oral BID  ? torsemide  20 mg Oral Once per day on Mon Tue Wed Thu Fri Sat  ? ?Continuous Infusions: ? ? ?PRN Meds: ?acetaminophen, ALPRAZolam, bisacodyl, ipratropium, magnesium hydroxide, menthol-cetylpyridinium **OR** phenol, nitroGLYCERIN, senna-docusate  ? ?Vital Signs  ?  ?Vitals:  ? 06/09/21 1609 06/09/21 2016 06/10/21 0534 06/10/21 0800  ?BP: (!) 129/51 (!) 121/56 (!) 116/46 (!) 131/55  ?Pulse: 91 89 98 91  ?Resp: 18 16 20 18   ?Temp: (!) 97.5 ?F (36.4 ?C) 98.5 ?F (36.9 ?C) (!) 97.5 ?F (36.4 ?C) 98.2 ?F (36.8 ?C)  ?TempSrc:      ?SpO2: 94% 98% 96% 100%  ?Weight:      ? ? ?Intake/Output Summary (Last 24 hours) at 06/10/2021 1219 ?Last data filed at 06/10/2021  1008 ?Gross per 24 hour  ?Intake 720 ml  ?Output 300 ml  ?Net 420 ml  ? ? ? ?  06/05/2021  ?  4:52 PM 05/08/2021  ?  2:22 PM 03/01/2021  ? 11:23 AM  ?Last 3 Weights  ?Weight (lbs) 150 lb 147 lb 2 oz 142 lb  ?Weight (kg) 68.04 kg 66.735 kg 64.411 kg  ?   ? ?Telemetry  ?  ?NSR HR 80s - Personally Reviewed ? ?ECG  ?  ?No new - Personally Reviewed ? ?Physical Exam  ? ?Physical Exam: ?Blood pressure (!) 131/55, pulse 91, temperature 98.2 ?F (36.8 ?C), resp. rate 18, weight 68 kg, SpO2 100 %. ? ?GEN:  elderly female,  severely debilitated  ?HEENT: Normal ?NECK: No JVD; No carotid bruits ?LYMPHATICS: No lymphadenopathy ?CARDIAC: RRR  ops ?RESPIRATORY:  Clear to auscultation without rales, wheezing or rhonchi poor inspiratory effort  ( it is difficult to examine her lungs posteriorly - she does not / (? Cannot) assist in lifting her upper body forward ?ABDOMEN: Soft, non-tender, non-distended ?MUSCULOSKELETAL: 1-2 + pitting  ?SKIN: Warm and dry ?NEUROLOGIC:  Alert and oriented x 3 ? ? ?Labs  ?  ?High Sensitivity Troponin:   ?Recent Labs  ?Lab  06/06/21 ?7782 06/06/21 ?0350 06/06/21 ?1132 06/06/21 ?1321 06/08/21 ?2156  ?TROPONINIHS 926* [id number]* 1,935* 1,888* 1,177*  ? ?   ?Chemistry ?Recent Labs  ?Lab 06/05/21 ?1706 06/06/21 ?0350 06/08/21 ?0344 06/08/21 ?2156 06/08/21 ?2158 06/09/21 ?4235  ?NA 140   < > 137 134*  --  135  ?K 4.3   < > 3.9 3.8  --  4.0  ?CL 103   < > 103 105  --  106  ?CO2 26   < > 21* 20*  --  19*  ?GLUCOSE 159*   < > 141* 163*  --  110*  ?BUN 24*   < > 22 32*  --  31*  ?CREATININE 1.47*   < > 1.15* 1.50*  --  1.38*  ?CALCIUM 9.4   < > 7.3* 6.8*  --  7.5*  ?MG  --   --   --   --  2.4  --   ?PROT 8.2*  --   --  5.7*  --  6.4*  ?ALBUMIN 4.4  --   --  2.8*  --  3.4*  ?AST 29  --   --  137*  --  182*  ?ALT 19  --   --  78*  --  92*  ?ALKPHOS 54  --   --  49  --  44  ?BILITOT 1.2  --   --  0.6  --  0.7  ?GFRNONAA 36*   < > 48* 35*  --  39*  ?ANIONGAP 11   < > 13 9  --  10  ? < > = values in this interval not  displayed.  ? ?  ?Lipids No results for input(s): CHOL, TRIG, HDL, LABVLDL, LDLCALC, CHOLHDL in the last 168 hours.  ?Hematology ?Recent Labs  ?Lab 06/08/21 ?0344 06/08/21 ?2156 06/09/21 ?3614  ?WBC 10.1 11.3* 9.8  ?RBC 3.29* 2.91* 2.86*  ?HGB 10.6* 9.1* 9.1*  ?HCT 32.5* 28.6* 28.2*  ?MCV 98.8 98.3 98.6  ?MCH 32.2 31.3 31.8  ?MCHC 32.6 31.8 32.3  ?RDW 13.2 13.8 13.8  ?PLT 168 169 144*  ? ? ?Thyroid No results for input(s): TSH, FREET4 in the last 168 hours.  ?BNP ?Recent Labs  ?Lab 06/05/21 ?1706 06/06/21 ?0350  ?BNP 820.3* 1,590.0*  ? ?  ?DDimer No results for input(s): DDIMER in the last 168 hours.  ? ?Radiology  ?  ?CT ABDOMEN PELVIS WO CONTRAST ? ?Result Date: 06/09/2021 ?CLINICAL DATA:  Liver failure and back pain. EXAM: CT ABDOMEN AND PELVIS WITHOUT CONTRAST TECHNIQUE: Multidetector CT imaging of the abdomen and pelvis was performed following the standard protocol without IV contrast. RADIATION DOSE REDUCTION: This exam was performed according to the departmental dose-optimization program which includes automated exposure control, adjustment of the mA and/or kV according to patient size and/or use of iterative reconstruction technique. COMPARISON:  None. FINDINGS: Lower chest: There is mild to moderate severity cardiomegaly with dilatation of the left ventricle. Mild atelectatic changes are seen within the bilateral lung bases. Small bilateral pleural effusions are noted. Hepatobiliary: No focal liver abnormality is seen. The gallbladder is moderately distended without evidence of gallstones, gallbladder wall thickening, or biliary dilatation. Pancreas: Unremarkable. No pancreatic ductal dilatation or surrounding inflammatory changes. Spleen: Normal in size without focal abnormality. Adrenals/Urinary Tract: Adrenal glands are unremarkable. The left kidney is atrophic in appearance. Mild compensatory hypertrophy of the right kidney is seen. There is no evidence of renal calculi, focal lesion, or hydronephrosis. A  tiny focus of air is  seen within the urinary bladder lumen, likely secondary to recent Foley catheterization. Stomach/Bowel: There is a small hiatal hernia. The appendix is not clearly identified. No evidence of bowel wall thickening, distention, or inflammatory changes. Vascular/Lymphatic: Aortic atherosclerosis. No enlarged abdominal or pelvic lymph nodes. Reproductive: Uterus and bilateral adnexa are unremarkable. Other: No abdominal wall hernia or abnormality. No abdominopelvic ascites. Musculoskeletal: A moderate amount of intramuscular and subcutaneous soft tissue air is seen along the lateral aspect of the left hip, medial aspect of the proximal left femur and posterolateral aspect of the left pelvic wall. A recently placed total left hip replacement is seen with associated streak artifact and subsequently limited evaluation of the adjacent osseous and soft tissue structures. Compression fracture deformities are seen involving the T10, L1 and L2 vertebral bodies. Prior vertebroplasty is noted at T10. Additional fracture deformities of indeterminate age are noted along the superior endplates of the L3 and L4 vertebral bodies. IMPRESSION: 1. Findings consistent with recent total left hip replacement and associated postoperative changes within the adjacent soft tissues. 2. Multiple fracture deformities within the thoracic and lumbar spine, as described above. Correlation with MRI is recommended. 3. Small bilateral pleural effusions. 4. Mild to moderate severity cardiomegaly with dilatation of the left ventricle. 5. Small hiatal hernia. 6. Atrophic left kidney with compensatory hypertrophy of the right kidney. 7. Aortic atherosclerosis. Aortic Atherosclerosis (ICD10-I70.0). Electronically Signed   By: Virgina Norfolk M.D.   On: 06/09/2021 00:20  ? ?CT HEAD WO CONTRAST (5MM) ? ?Result Date: 06/08/2021 ?CLINICAL DATA:  Altered mental status EXAM: CT HEAD WITHOUT CONTRAST TECHNIQUE: Contiguous axial images were  obtained from the base of the skull through the vertex without intravenous contrast. RADIATION DOSE REDUCTION: This exam was performed according to the departmental dose-optimization program which includes automated ex

## 2021-06-11 DIAGNOSIS — I10 Essential (primary) hypertension: Secondary | ICD-10-CM | POA: Diagnosis not present

## 2021-06-11 DIAGNOSIS — S72002A Fracture of unspecified part of neck of left femur, initial encounter for closed fracture: Secondary | ICD-10-CM | POA: Diagnosis not present

## 2021-06-11 DIAGNOSIS — I5022 Chronic systolic (congestive) heart failure: Secondary | ICD-10-CM | POA: Diagnosis not present

## 2021-06-11 DIAGNOSIS — W19XXXA Unspecified fall, initial encounter: Secondary | ICD-10-CM | POA: Diagnosis not present

## 2021-06-11 LAB — SURGICAL PATHOLOGY

## 2021-06-11 MED ORDER — SACUBITRIL-VALSARTAN 24-26 MG PO TABS
1.0000 | ORAL_TABLET | Freq: Two times a day (BID) | ORAL | Status: AC
  Administered 2021-06-11: 1 via ORAL
  Filled 2021-06-11: qty 1

## 2021-06-11 NOTE — Telephone Encounter (Signed)
Pt called in stating that she is in the hospital at [facility] for a broken hip... Pt stated that the nurse at the hospital stated that they can give her the prolia shot in the hospital as long as it approve/authorized... Pt requesting callback...  ?

## 2021-06-11 NOTE — Progress Notes (Signed)
Physical Therapy Treatment ?Patient Details ?Name: RELDA Hernandez ?MRN: [id number] ?DOB: 16-May-1940 ?Today's Date: 06/11/2021 ? ? ?History of Present Illness Pt is a 81 y.o. female presenting to hospital 4/4 s/p fall (lost balance walking without walker use).  Imaging showing acute comminuted displaced impacted L femoral neck fx; age-indeterminate fx of the R posterolateral 4th rib with mild displacement; multiple remote appearing thoracic compression fx's (new at T5 and T6 since prior imaging).  Pt admitted with closed L hip fx and NSTEMI.  S/p L hip hemiarthroplasty 4/6.  PMH includes CAD, anxiety, OA, ischemic cariomyopathy, L BBB, moderate-severe mitral regurgitation, UTI, CHF, Hepatitis C, chronic back pain, chronic fatigue, back sx. ? ?  ?PT Comments  ? ? Pt ready for session.  Pre-medicated.  Participated in exercises as described below. ? To EOB with mod/max a x 1 and increased time for cues and encouragement.  Once sitting steady.  She is able to stand at bedside with mod/max a x 1 but is unable to safely take steps.  +2 called and is assisted to chair with mod a x 2 and heavy cues and encouragement.  Remains in chair after session. ? ?Pt continues to need much encouragement for movement.  She does have multiple complaints but does well with validation of complaints but then encouragement to keep moving.   ?  ?Recommendations for follow up therapy are one component of a multi-disciplinary discharge planning process, led by the attending physician.  Recommendations may be updated based on patient status, additional functional criteria and insurance authorization. ? ?Follow Up Recommendations ? Skilled nursing-short term rehab (<3 hours/day) ?  ?  ?Assistance Recommended at Discharge    ?Patient can return home with the following Two people to help with walking and/or transfers;Two people to help with bathing/dressing/bathroom;Assistance with cooking/housework;Direct supervision/assist for financial  management;Assist for transportation;Help with stairs or ramp for entrance ?  ?Equipment Recommendations ? Rolling walker (2 wheels);BSC/3in1;Wheelchair (measurements PT);Wheelchair cushion (measurements PT);Hospital bed;Other (comment)  ?  ?Recommendations for Other Services   ? ? ?  ?Precautions / Restrictions Precautions ?Precautions: Posterior Hip;Fall ?Precaution Booklet Issued: Yes (comment) ?Precaution Comments: comp fractures ?Restrictions ?Weight Bearing Restrictions: Yes ?LLE Weight Bearing: Weight bearing as tolerated  ?  ? ?Mobility ? Bed Mobility ?Overal bed mobility: Needs Assistance ?Bed Mobility: Supine to Sit ?  ?  ?Supine to sit: Max assist, Mod assist ?  ?  ?General bed mobility comments: able to assist some today with time and cues ?  ? ?Transfers ?Overall transfer level: Needs assistance ?Equipment used: Rolling walker (2 wheels) ?Transfers: Sit to/from Stand ?Sit to Stand: Max assist ?Stand pivot transfers: Mod assist, Max assist, +2 physical assistance ?  ?  ?  ?  ?General transfer comment: pt was able to stand pivot to recliner with +2 assistance for safety ?  ? ?Ambulation/Gait ?  ?  ?  ?  ?  ?  ?  ?General Gait Details: unable to take any true steps/gait ? ? ?Stairs ?  ?  ?  ?  ?  ? ? ?Wheelchair Mobility ?  ? ?Modified Rankin (Stroke Patients Only) ?  ? ? ?  ?Balance Overall balance assessment: Needs assistance ?Sitting-balance support: Bilateral upper extremity supported, Feet supported ?Sitting balance-Leahy Scale: Poor ?  ?  ?Standing balance support: Bilateral upper extremity supported, During functional activity, Reliant on assistive device for balance ?Standing balance-Leahy Scale: Poor ?Standing balance comment: BUE support on RW, unable to shift pelvis anteriorly despite cuing to do  so, as well as cuing for upright posture pt continues to lean forward flexed onto RW ?  ?  ?  ?  ?  ?  ?  ?  ?  ?  ?  ?  ? ?  ?Cognition Arousal/Alertness: Awake/alert ?Behavior During Therapy: [facility] for  tasks assessed/performed, Anxious ?Overall Cognitive Status: Within Functional Limits for tasks assessed ?  ?  ?  ?  ?  ?  ?  ?  ?  ?  ?  ?  ?  ?  ?  ?  ?  ?  ?  ? ?  ?Exercises General Exercises - Lower Extremity ?Ankle Circles/Pumps: AROM, Strengthening, Both, 10 reps, Supine ?Quad Sets: AROM, Strengthening, Both, 10 reps, Supine ?Gluteal Sets: AROM, Strengthening, Both, 10 reps, Supine ?Short Arc Quad: AROM, Strengthening, Both, 10 reps, Supine ?Heel Slides: AROM, Right, AAROM, Left, Strengthening, 10 reps, Supine ?Hip ABduction/ADduction: AROM, Right, AAROM, Left, Strengthening, 10 reps, Supine ? ?  ?General Comments   ?  ?  ? ?Pertinent Vitals/Pain Pain Assessment ?Pain Assessment: Faces ?Faces Pain Scale: Hurts even more ?Pain Location: L hip, back ?Pain Descriptors / Indicators: Grimacing, Discomfort ?Pain Intervention(s): Limited activity within patient's tolerance, Monitored during session, Repositioned  ? ? ?Home Living   ?  ?  ?  ?  ?  ?  ?  ?  ?  ?   ?  ?Prior Function    ?  ?  ?   ? ?PT Goals (current goals can now be found in the care plan section) Progress towards PT goals: Progressing toward goals ? ?  ?Frequency ? ? ? BID ? ? ? ?  ?PT Plan Current plan remains appropriate  ? ? ?Co-evaluation   ?  ?  ?  ?  ? ?  ?AM-PAC PT "6 Clicks" Mobility   ?Outcome Measure ? Help needed turning from your back to your side while in a flat bed without using bedrails?: A Lot ?Help needed moving from lying on your back to sitting on the side of a flat bed without using bedrails?: A Lot ?Help needed moving to and from a bed to a chair (including a wheelchair)?: A Lot ?Help needed standing up from a chair using your arms (e.g., wheelchair or bedside chair)?: A Lot ?Help needed to walk in hospital room?: Total ?Help needed climbing 3-5 steps with a railing? : Total ?6 Click Score: 10 ? ?  ?End of Session Equipment Utilized During Treatment: Gait belt ?Activity Tolerance: Patient tolerated treatment well;Patient limited  by pain ?Patient left: in chair;with call bell/phone within reach;with chair alarm set;with family/visitor present;with SCD's reapplied ?Nurse Communication: Mobility status ?PT Visit Diagnosis: Muscle weakness (generalized) (M62.81);Difficulty in walking, not elsewhere classified (R26.2);Other abnormalities of gait and mobility (R26.89);Pain;Unsteadiness on feet (R26.81) ?Pain - Right/Left: Left ?Pain - part of body: Hip ?  ? ? ?Time: 0623-7628 ?PT Time Calculation (min) (ACUTE ONLY): 24 min ? ?Charges:  $Therapeutic Exercise: 8-22 mins ?$Therapeutic Activity: 8-22 mins          ?          ?Chesley Noon, PTA ?06/11/21, 1:33 PM ? ?

## 2021-06-11 NOTE — Plan of Care (Signed)

## 2021-06-11 NOTE — Progress Notes (Signed)
?  Subjective: ?4 Days Post-Op Procedure(s) (LRB): ?ARTHROPLASTY BIPOLAR HIP (HEMIARTHROPLASTY) (Left) ?Patient reports pain as  imrpoved .   ?Patient is well, and has had no acute complaints or problems ?Plan is to go Skilled nursing facility after hospital stay. ?Negative for chest pain and shortness of breath ?Fever: no ?Gastrointestinal: negative for nausea and vomiting.  Patient has not had a bowel movement. ? ?Objective: ?Vital signs in last 24 hours: ?Temp:  [97.4 ?F (36.3 ?C)-98.3 ?F (36.8 ?C)] 98.3 ?F (36.8 ?C) (04/09 2000) ?Pulse Rate:  [76-95] 76 (04/10 0350) ?Resp:  [18] 18 (04/09 1546) ?BP: (108-137)/(52-73) 137/73 (04/10 0350) ?SpO2:  [87 %-100 %] 97 % (04/10 0350) ? ?Intake/Output from previous day: ? ?Intake/Output Summary (Last 24 hours) at 06/11/2021 0715 ?Last data filed at 06/11/2021 0405 ?Gross per 24 hour  ?Intake 720 ml  ?Output 3150 ml  ?Net -2430 ml  ?  ?Intake/Output this shift: ?No intake/output data recorded. ? ?Labs: ?Recent Labs  ?  06/08/21 ?2156 06/09/21 ?9937  ?HGB 9.1* 9.1*  ? ?Recent Labs  ?  06/08/21 ?2156 06/09/21 ?1696  ?WBC 11.3* 9.8  ?RBC 2.91* 2.86*  ?HCT 28.6* 28.2*  ?PLT 169 144*  ? ?Recent Labs  ?  06/08/21 ?2156 06/09/21 ?7893  ?NA 134* 135  ?K 3.8 4.0  ?CL 105 106  ?CO2 20* 19*  ?BUN 32* 31*  ?CREATININE 1.50* 1.38*  ?GLUCOSE 163* 110*  ?CALCIUM 6.8* 7.5*  ? ?No results for input(s): LABPT, INR in the last 72 hours. ? ? ?EXAM ?General - Patient is Alert, Appropriate, and Oriented ?Extremity - Neurovascular intact ?Dorsiflexion/Plantar flexion intact ?Compartment soft ?Dressing/Incision -mild bloody drainage noted, but stable.  Plan to change dressing before discharge. ?Motor Function - intact, moving foot and toes well on exam.  Ambulating bed to chair with assistance ? ? ?Assessment/Plan: ?4 Days Post-Op Procedure(s) (LRB): ?ARTHROPLASTY BIPOLAR HIP (HEMIARTHROPLASTY) (Left) ?Principal Problem: ?  Closed left hip fracture ([place]) ?Active Problems: ?  Essential hypertension ?   Hyperlipidemia ?  Chronic systolic CHF (congestive heart failure) ([place]) ?  Non-STEMI (non-ST elevated myocardial infarction) ([place]) ?  Fall ?  Elevated troponin ? ?Estimated body mass index is 29.29 kg/m? as calculated from the following: ?  Height as of 05/08/21: 5' (1.524 m). ?  Weight as of this encounter: 68 kg. ?Advance diet ?Up with therapy ? ?Dressing change before discharge. ?Nurses discussed to work on bowel movement. ? ?DVT Prophylaxis - Ted hose and SCDs, Plavix ?Weight-Bearing as tolerated to left leg ? ?Reche Dixon PA-C ?[facility] ?06/11/2021, 7:15 AM ? ?

## 2021-06-11 NOTE — Care Management Important Message (Signed)
Important Message ? ?Patient Details  ?Name: [name] ?MRN: [id number] ?Date of Birth: Mar 15, 1940 ? ? ?Medicare Important Message Given:  Yes ? ?I reviewed the Important Message from Medicare with the patient but she did not feel like signing the form. I left a copy with her and wished her a speedy recovery and thanked her for her time.  ? ? ?Juliann Pulse A Pecola Haxton ?06/11/2021, 3:23 PM ?

## 2021-06-11 NOTE — Progress Notes (Signed)
? ? ?Progress Note ? ?Patient Name: [name] ?Date of Encounter: 06/11/2021 ? ?Primary Cardiologist: Rockey Situ ? ?Subjective  ? ?Feels "exhausted."  Just worked with PT.  No chest pain or dyspnea.  Reports she is largely able to lay flat.  Documented UOP 2.4 L for the past 24 hours, net + 2.4 L for the admission. BP stable on midodrine. Remains on IV Lasix.  Notes hip/leg pain. ? ?Inpatient Medications  ?  ?Scheduled Meds: ? calcium-vitamin D  1 tablet Oral Q breakfast  ? Chlorhexidine Gluconate Cloth  6 each Topical Q0600  ? citalopram  10 mg Oral Daily  ? clopidogrel  75 mg Oral Daily  ? docusate sodium  100 mg Oral BID  ? doxycycline  100 mg Oral Q12H  ? enoxaparin (LOVENOX) injection  40 mg Subcutaneous Q24H  ? furosemide  40 mg Intravenous Q12H  ? metoprolol succinate  25 mg Oral Daily  ? midodrine  5 mg Oral TID WC  ? multivitamin with minerals  1 tablet Oral Daily  ? Ensure Max Protein  11 oz Oral Daily  ? sacubitril-valsartan  1 tablet Oral BID  ? ?Continuous Infusions: ? ?PRN Meds: ?acetaminophen, ALPRAZolam, bisacodyl, ipratropium, magnesium hydroxide, menthol-cetylpyridinium **OR** phenol, nitroGLYCERIN, senna-docusate, traMADol  ? ?Vital Signs  ?  ?Vitals:  ? 06/10/21 2011 06/11/21 0114 06/11/21 0350 06/11/21 0727  ?BP:  (!) 121/58 137/73 122/60  ?Pulse:  80 76 77  ?Resp:    20  ?Temp:    (!) 97 ?F (36.1 ?C)  ?TempSrc:    Axillary  ?SpO2: 93% 97% 97%   ?Weight:      ? ? ?Intake/Output Summary (Last 24 hours) at 06/11/2021 0836 ?Last data filed at 06/11/2021 0405 ?Gross per 24 hour  ?Intake 720 ml  ?Output 3150 ml  ?Net -2430 ml  ? ?Filed Weights  ? 06/05/21 1652  ?Weight: 68 kg  ? ? ?Telemetry  ?  ?Not on tele - Personally Reviewed ? ?ECG  ?  ?No new tracings - Personally Reviewed ? ?Physical Exam  ? ?GEN: No acute distress.   ?Neck: No JVD. ?Cardiac: RRR, II/VI systolic murmur LSB, no rubs, or gallops.  ?Respiratory: Clear to auscultation bilaterally.  ?GI: Soft, nontender, non-distended.   ?MS: Mild  pretibial edema bilaterally; No deformity. ?Neuro:  Alert and oriented x 3; Nonfocal.  ?Psych: Normal affect. ? ?Labs  ?  ?Chemistry ?Recent Labs  ?Lab 06/05/21 ?1706 06/06/21 ?0350 06/08/21 ?0344 06/08/21 ?2156 06/09/21 ?8786  ?NA 140   < > 137 134* 135  ?K 4.3   < > 3.9 3.8 4.0  ?CL 103   < > 103 105 106  ?CO2 26   < > 21* 20* 19*  ?GLUCOSE 159*   < > 141* 163* 110*  ?BUN 24*   < > 22 32* 31*  ?CREATININE 1.47*   < > 1.15* 1.50* 1.38*  ?CALCIUM 9.4   < > 7.3* 6.8* 7.5*  ?PROT 8.2*  --   --  5.7* 6.4*  ?ALBUMIN 4.4  --   --  2.8* 3.4*  ?AST 29  --   --  137* 182*  ?ALT 19  --   --  78* 92*  ?ALKPHOS 54  --   --  49 44  ?BILITOT 1.2  --   --  0.6 0.7  ?GFRNONAA 36*   < > 48* 35* 39*  ?ANIONGAP 11   < > 13 9 10   ? < > = values in  this interval not displayed.  ?  ? ?Hematology ?Recent Labs  ?Lab 06/08/21 ?0344 06/08/21 ?2156 06/09/21 ?8088  ?WBC 10.1 11.3* 9.8  ?RBC 3.29* 2.91* 2.86*  ?HGB 10.6* 9.1* 9.1*  ?HCT 32.5* 28.6* 28.2*  ?MCV 98.8 98.3 98.6  ?MCH 32.2 31.3 31.8  ?MCHC 32.6 31.8 32.3  ?RDW 13.2 13.8 13.8  ?PLT 168 169 144*  ? ? ?Cardiac EnzymesNo results for input(s): TROPONINI in the last 168 hours. No results for input(s): TROPIPOC in the last 168 hours.  ? ?BNP ?Recent Labs  ?Lab 06/05/21 ?1706 06/06/21 ?0350  ?BNP 820.3* 1,590.0*  ?  ? ?DDimer No results for input(s): DDIMER in the last 168 hours.  ? ?Radiology  ?  ?No results found. ? ?Cardiac Studies  ? ?2D echo 06/06/2021: ?1. Left ventricular ejection fraction, by estimation, is 25 to 30%. The  ?left ventricle has severely decreased function. The left ventricle  ?demonstrates global hypokinesis. The left ventricular internal cavity size  ?was mildly dilated. There is moderate  ?asymmetric left ventricular hypertrophy of the septal segment. Left  ?ventricular diastolic parameters are consistent with Grade II diastolic  ?dysfunction (pseudonormalization).  ? 2. Right ventricular systolic function is low normal. The right  ?ventricular size is mildly  enlarged.  ? 3. Left atrial size was moderately dilated.  ? 4. The mitral valve is normal in structure. Moderate mitral valve  ?regurgitation.  ? 5. The aortic valve is tricuspid. Aortic valve regurgitation is trivial. ?__________ ?  ?2D echo 06/16/2017: ?- Left ventricle: The cavity size was severely dilated. Wall  ?  thickness was normal. Systolic function was severely reduced. The  ?  estimated ejection fraction was in the range of 20% to 25%.  ?  Diffuse hypokinesis. Features are consistent with a pseudonormal  ?  left ventricular filling pattern, with concomitant abnormal  ?  relaxation and increased filling pressure (grade 2 diastolic  ?  dysfunction).  ?- Aortic valve: There was mild regurgitation.  ?- Mitral valve: There was moderate to severe regurgitation.  ?- Left atrium: The atrium was moderately dilated.  ?- Pulmonary arteries: Systolic pressure could not be accurately  ?  estimated. ?___________ ?  ?2D echo 01/23/2015: ?- Left ventricle: The cavity size was moderately dilated. Systolic  ?  function was severely reduced. The estimated ejection fraction  ?  was in the range of 20% to 25%. Diffuse hypokinesis. Regional  ?  wall motion abnormalities cannot be excluded. Doppler parameters  ?  are consistent with abnormal left ventricular relaxation (grade 1  ?  diastolic dysfunction).  ?- Aortic valve: There was mild regurgitation.  ?- Mitral valve: There was moderate regurgitation.  ?- Left atrium: The atrium was mildly dilated.  ?- Right ventricle: Systolic function was normal.  ?- Pulmonary arteries: Systolic pressure was mildly elevated. PA  ?  peak pressure: 43 mm Hg (S).  ?- Pericardium, extracardiac: A small circumferential pericardial  ?  effusion was identified, no hemodynamic compromise. ?__________ ?  ?LHC 10/27/2014: ?Ost LAD lesion, 30% stenosed. ?Ost RCA lesion, 30% stenosed. ?Ost Ramus lesion, 50% stenosed. ?Mid RCA lesion, 90% stenosed. There is a 0% residual stenosis post intervention. ?A  drug-eluting stent was placed. ?Prox LAD lesion, 90% stenosed. There is a 0% residual stenosis post intervention. ?A drug-eluting stent was placed. ?  ? Successful angioplasty and drug-eluting stent placement to both mid RCA in mid LAD. ?  ? Recommendations: ? Continue dual antiplatelets therapy for at least 12 months. Recommend aggressive treatment  of risk factors. I started small dose carvedilol and lisinopril for cardiomyopathy. ?__________ ?  ?2D echo 10/25/2014: ?- Left ventricle: LVEF is severely depressed at approximately 20%  ?  with akinesis of the inferior, inferoseptal, distal anterior and  ?  apical walls; hypokinesis elsewhere. The cavity size was mildly  ?  dilated. Wall thickness was normal.  ?- Aortic valve: There was mild regurgitation.  ?- Mitral valve: There was mild to moderate regurgitation.  ?- Left atrium: The atrium was mildly dilated.  ?- Right ventricle: Systolic function was moderately to severely  ?  reduced.  ?- Pericardium, extracardiac: A small pericardial effusion was  ?  identified. There was a left pleural effusion. ? ?Patient Profile  ?   ?81 y.o. female with history of CAD s/p PCI to the mid LAD and mid RCA in 01/2015, HFrEF secondary to ICM, mitral regurgitation, LBBB, HTN, HLD, hepatitis C, CKD stage III, chronic back pain s/p multiple surgeries, and chronic UTIs who was admitted with a left hip fracture in the setting of a mechanical fall whom we are seeing for NSTEMI.  ? ?Assessment & Plan  ?  ?1. CAD involving the native coronary arteries with NSTEMI: ?-No symptoms of angina ?-She has completed 48 hours ?-Will need cardiac cath now that she has undergone surgical repair of her hip fracture with timing to be determined, currently not a good candidate given weakness ?-PTA Plavix and Crestor ?  ?2. HFrEF secondary to ICM: ?-Remains volume up ?-Continue IV Lasix  ?-We will plan to transition her from Entresto to losartan in an effort to allow for adequate BP room for diuresis with  plans to discontinue midodrine there after ?-Hopefully, this will be only a temporary transition, and following diuresis we would hopefully resume her PTA Entresto ?-Echo this admission with a slightly improved

## 2021-06-11 NOTE — Progress Notes (Signed)
[place] [facility] ? ? ?PATIENT NAME: [name]   ? ?MR#:  [id number] ? ?DATE OF BIRTH:  1940-05-27 ? ?SUBJECTIVE:  ? ?husband at bedside. Patient came in after mechanical fall. She usually walks with walker. Started having left hip pain. Found to have left impacted femoral neck fracture. ? Pt out in the chair ?Back pain. Just feels very weak ?husband at bedside ? ?good urine output with IV Lasix ? ?VITALS:  ?Blood pressure (!) 121/43, pulse 73, temperature (!) 97.5 ?F (36.4 ?C), temperature source Oral, resp. rate 17, weight 68 kg, SpO2 100 %. ? ?PHYSICAL EXAMINATION:  ? ?GENERAL:  81 y.o.-year-old patient lying in the bed with no acute distress. Pallor+ ?LUNGS: Normal breath sounds bilaterally, no wheezing, rales, rhonchi.  ?CARDIOVASCULAR: S1, S2 normal. No murmurs, rubs, or gallops.  ?ABDOMEN: Soft, nontender, nondistended. Bowel sounds present.  ?EXTREMITIES: left hip ROM+leg edema+ ?NEUROLOGIC: nonfocal  patient is alert and awake ?SKIN: No obvious rash, lesion, or ulcer.  ? ?LABORATORY PANEL:  ?CBC ?Recent Labs  ?Lab 06/09/21 ?2563  ?WBC 9.8  ?HGB 9.1*  ?HCT 28.2*  ?PLT 144*  ? ? ? ?Chemistries  ?Recent Labs  ?Lab 06/08/21 ?2158 06/09/21 ?8937  ?NA  --  135  ?K  --  4.0  ?CL  --  106  ?CO2  --  19*  ?GLUCOSE  --  110*  ?BUN  --  31*  ?CREATININE  --  1.38*  ?CALCIUM  --  7.5*  ?MG 2.4  --   ?AST  --  182*  ?ALT  --  92*  ?ALKPHOS  --  44  ?BILITOT  --  0.7  ? ? ?Cardiac Enzymes ?No results for input(s): TROPONINI in the last 168 hours. ?RADIOLOGY:  ?No results found. ? ?Assessment and Plan ?[name] is a 81 y.o. Caucasian female with medical history significant for coronary artery disease, anxiety, osteoarthritis, ischemic cardiomyopathy EF 25%, left bundle branch block, CAD/PCI to mid RCA, proximal LAD in 2016, moderate to severe MR, who presented to the ER with acute onset of accidental mechanical fall. ?Left hip x-ray showed acute comminuted and displaced, impacted  left femoral neck fracture with no hip dislocation. ? ?Acute left femoral neck fracture status post mechanical fall ?-- PRN pain meds ?-- orthopedic consultation with Dr. Posey Pronto ?-- surgery will be determined pending repeat troponin levels. ?--PT/OT -- recommends rehab ?--POD #2 cont PT ?--POD # 3--pt cont to be max 2 assist for PT ?  ?Elevated troponin in the setting of history of CAD without acute chest pain or shortness of breath ?HFrEF know 25-30% ?Ischemic cardiomyopathy with EF of 25% ?-- patient started on IV heparin drip per ACS protocol ?-- troponin 42-- 926 --1777-- 1935--1800--1100 ?-- continue Plavix ?-- continue metoprolol, Crestor ?-- seen by [facility] Dr. Garen Lah ?--4/6-- patient denies any chest pain. Discussed with orthopedic will start heparin drip tomorrow morning 5 AM since patient is symptom-free at present and prior to surgery troponins were trending down. ?--4/7-- heparin drip restarted. Cardiology Dr. Fletcher Anon to determine when patient can get cardiac cath. ?--4/8--heparin gtt stopped. BP stable. Holding Enteresto and torsemide  today ?--4/9--d/w dr Carlisle Cater Lasix 40 mg bid and cont midodrine 5 mg bid ?--4/10-- patient diuresis very well. Will continue IV Lasix. Dr End commands patient get cardiac cath done prior to discharge. Patient is a bit hesitant since she has chronic back pain and is very anxious about cath. ?-- Continue metoprolol.  Enteresto and Torsemide on hold ?-- blood pressure much improved. ? ?Hyperlipidemia ?-- Crestor ? ?Procedures: left hip surgery ?Family communication : husband at bedside ?Consults : cardiology, ortho ?CODE STATUS: full ?DVT Prophylaxis : lovenox SQ for 14 days per ortho ?Level of care: Med-Surg ?Status is: Inpatient ?Remains inpatient appropriate because: hip fracture and s/p surgery ?Volume overload being treated with IV lasix. ?Patient make it cath if she is in agreement with it. ? ?TOTAL TIME TAKING CARE OF THIS PATIENT: 25 minutes.  ?>50% time  spent on counselling and coordination of care ? ?Note: This dictation was prepared with Dragon dictation along with smaller phrase technology. Any transcriptional errors that result from this process are unintentional. ? ?Fritzi Mandes M.D  ? ? ?[facility]  ? ?CC: ?Primary care physician; McLean-Scocuzza, Nino Glow, MD  ?

## 2021-06-11 NOTE — Progress Notes (Signed)
Physical Therapy Treatment ?Patient Details ?Name: [name] ?MRN: [id number] ?DOB: Mar 25, 1940 ?Today's Date: 06/11/2021 ? ? ?History of Present Illness Pt is a 81 y.o. female presenting to hospital 4/4 s/p fall (lost balance walking without walker use).  Imaging showing acute comminuted displaced impacted L femoral neck fx; age-indeterminate fx of the R posterolateral 4th rib with mild displacement; multiple remote appearing thoracic compression fx's (new at T5 and T6 since prior imaging).  Pt admitted with closed L hip fx and NSTEMI.  S/p L hip hemiarthroplasty 4/6.  PMH includes CAD, anxiety, OA, ischemic cariomyopathy, L BBB, moderate-severe mitral regurgitation, UTI, CHF, Hepatitis C, chronic back pain, chronic fatigue, back sx. ? ?  ?PT Comments  ? ? Pt ready to return to bed.  +2 assist to transfer and return to supine.  Pain remains primary barrier.   ? ?Pt asking to speak to MD regarding further questions about possible cardiac cath procedure and relayed to provider. ?  ?Recommendations for follow up therapy are one component of a multi-disciplinary discharge planning process, led by the attending physician.  Recommendations may be updated based on patient status, additional functional criteria and insurance authorization. ? ?Follow Up Recommendations ? Skilled nursing-short term rehab (<3 hours/day) ?  ?  ?Assistance Recommended at Discharge    ?Patient can return home with the following Two people to help with walking and/or transfers;Two people to help with bathing/dressing/bathroom;Assistance with cooking/housework;Direct supervision/assist for financial management;Assist for transportation;Help with stairs or ramp for entrance ?  ?Equipment Recommendations ? Rolling walker (2 wheels);BSC/3in1;Wheelchair (measurements PT);Wheelchair cushion (measurements PT);Hospital bed;Other (comment)  ?  ?Recommendations for Other Services   ? ? ?  ?Precautions / Restrictions Precautions ?Precautions: Posterior  Hip;Fall ?Precaution Booklet Issued: Yes (comment) ?Precaution Comments: comp fractures ?Restrictions ?Weight Bearing Restrictions: Yes ?LLE Weight Bearing: Weight bearing as tolerated  ?  ? ?Mobility ? Bed Mobility ?Overal bed mobility: Needs Assistance ?Bed Mobility: Sit to Supine ?  ?  ?Supine to sit: Max assist, Mod assist ?Sit to supine: Max assist, +2 for physical assistance ?  ?General bed mobility comments: able to assist some today with time and cues ?  ? ?Transfers ?Overall transfer level: Needs assistance ?Equipment used: Rolling walker (2 wheels) ?Transfers: Sit to/from Stand ?Sit to Stand: Mod assist, +2 physical assistance ?Stand pivot transfers: Mod assist, Max assist, +2 physical assistance ?  ?  ?  ?  ?General transfer comment: pt was able to stand pivot to recliner with +2 assistance for safety ?  ? ?Ambulation/Gait ?  ?  ?  ?  ?  ?  ?  ?General Gait Details: unable to take any true steps/gait ? ? ?Stairs ?  ?  ?  ?  ?  ? ? ?Wheelchair Mobility ?  ? ?Modified Rankin (Stroke Patients Only) ?  ? ? ?  ?Balance Overall balance assessment: Needs assistance ?Sitting-balance support: Bilateral upper extremity supported, Feet supported ?Sitting balance-Leahy Scale: Poor ?  ?  ?Standing balance support: Bilateral upper extremity supported, During functional activity, Reliant on assistive device for balance ?Standing balance-Leahy Scale: Poor ?Standing balance comment: BUE support on RW, unable to shift pelvis anteriorly despite cuing to do so, as well as cuing for upright posture pt continues to lean forward flexed onto RW ?  ?  ?  ?  ?  ?  ?  ?  ?  ?  ?  ?  ? ?  ?Cognition Arousal/Alertness: Awake/alert ?Behavior During Therapy: [facility] for tasks assessed/performed, Anxious ?Overall  Cognitive Status: Within Functional Limits for tasks assessed ?  ?  ?  ?  ?  ?  ?  ?  ?  ?  ?  ?  ?  ?  ?  ?  ?  ?  ?  ? ?  ?Exercises General Exercises - Lower Extremity ?Ankle Circles/Pumps: AROM, Strengthening, Both, 10 reps,  Supine ?Quad Sets: AROM, Strengthening, Both, 10 reps, Supine ?Gluteal Sets: AROM, Strengthening, Both, 10 reps, Supine ?Short Arc Quad: AROM, Strengthening, Both, 10 reps, Supine ?Heel Slides: AROM, Right, AAROM, Left, Strengthening, 10 reps, Supine ?Hip ABduction/ADduction: AROM, Right, AAROM, Left, Strengthening, 10 reps, Supine ? ?  ?General Comments   ?  ?  ? ?Pertinent Vitals/Pain Pain Assessment ?Pain Assessment: Faces ?Faces Pain Scale: Hurts whole lot ?Pain Location: L hip, back ?Pain Descriptors / Indicators: Grimacing, Discomfort ?Pain Intervention(s): Limited activity within patient's tolerance, Monitored during session, Repositioned  ? ? ?Home Living   ?  ?  ?  ?  ?  ?  ?  ?  ?  ?   ?  ?Prior Function    ?  ?  ?   ? ?PT Goals (current goals can now be found in the care plan section) Acute Rehab PT Goals ?Patient Stated Goal: decreased pain ?Progress towards PT goals: Progressing toward goals ? ?  ?Frequency ? ? ? BID ? ? ? ?  ?PT Plan Current plan remains appropriate  ? ? ?Co-evaluation   ?  ?  ?  ?  ? ?  ?AM-PAC PT "6 Clicks" Mobility   ?Outcome Measure ? Help needed turning from your back to your side while in a flat bed without using bedrails?: A Lot ?Help needed moving from lying on your back to sitting on the side of a flat bed without using bedrails?: A Lot ?Help needed moving to and from a bed to a chair (including a wheelchair)?: A Lot ?Help needed standing up from a chair using your arms (e.g., wheelchair or bedside chair)?: A Lot ?Help needed to walk in hospital room?: Total ?Help needed climbing 3-5 steps with a railing? : Total ?6 Click Score: 10 ? ?  ?End of Session Equipment Utilized During Treatment: Gait belt ?Activity Tolerance: Patient tolerated treatment well;Patient limited by pain ?Patient left: in chair;with call bell/phone within reach;with chair alarm set;with family/visitor present;with SCD's reapplied ?Nurse Communication: Mobility status ?PT Visit Diagnosis: Muscle weakness  (generalized) (M62.81);Difficulty in walking, not elsewhere classified (R26.2);Other abnormalities of gait and mobility (R26.89);Pain;Unsteadiness on feet (R26.81) ?Pain - Right/Left: Left ?Pain - part of body: Hip ?  ? ? ?Time: 0240-0252 ?PT Time Calculation (min) (ACUTE ONLY): 12 min ? ?Charges:  $Therapeutic Exercise: 8-22 mins ?$Therapeutic Activity: 8-22 mins          ?         Chesley Noon, PTA ?06/11/21, 3:19 PM ? ?

## 2021-06-12 DIAGNOSIS — I5023 Acute on chronic systolic (congestive) heart failure: Secondary | ICD-10-CM | POA: Diagnosis not present

## 2021-06-12 DIAGNOSIS — E876 Hypokalemia: Secondary | ICD-10-CM | POA: Diagnosis not present

## 2021-06-12 DIAGNOSIS — K5900 Constipation, unspecified: Secondary | ICD-10-CM

## 2021-06-12 DIAGNOSIS — S72002A Fracture of unspecified part of neck of left femur, initial encounter for closed fracture: Secondary | ICD-10-CM | POA: Diagnosis not present

## 2021-06-12 LAB — CBC
HCT: 33.9 % — ABNORMAL LOW (ref 36.0–46.0)
Hemoglobin: 11.1 g/dL — ABNORMAL LOW (ref 12.0–15.0)
MCH: 31.7 pg (ref 26.0–34.0)
MCHC: 32.7 g/dL (ref 30.0–36.0)
MCV: 96.9 fL (ref 80.0–100.0)
Platelets: 280 10*3/uL (ref 150–400)
RBC: 3.5 MIL/uL — ABNORMAL LOW (ref 3.87–5.11)
RDW: 14.1 % (ref 11.5–15.5)
WBC: 8.5 10*3/uL (ref 4.0–10.5)
nRBC: 0.6 % — ABNORMAL HIGH (ref 0.0–0.2)

## 2021-06-12 LAB — COMPREHENSIVE METABOLIC PANEL
ALT: 39 U/L (ref 0–44)
AST: 39 U/L (ref 15–41)
Albumin: 3.2 g/dL — ABNORMAL LOW (ref 3.5–5.0)
Alkaline Phosphatase: 69 U/L (ref 38–126)
Anion gap: 10 (ref 5–15)
BUN: 29 mg/dL — ABNORMAL HIGH (ref 8–23)
CO2: 29 mmol/L (ref 22–32)
Calcium: 8.6 mg/dL — ABNORMAL LOW (ref 8.9–10.3)
Chloride: 100 mmol/L (ref 98–111)
Creatinine, Ser: 0.93 mg/dL (ref 0.44–1.00)
GFR, Estimated: >60 mL/min (ref >60)
Glucose, Bld: 120 mg/dL — ABNORMAL HIGH (ref 70–99)
Potassium: 3.1 mmol/L — ABNORMAL LOW (ref 3.5–5.1)
Sodium: 139 mmol/L (ref 135–145)
Total Bilirubin: 1.3 mg/dL — ABNORMAL HIGH (ref 0.3–1.2)
Total Protein: 6.4 g/dL — ABNORMAL LOW (ref 6.5–8.1)

## 2021-06-12 LAB — LEGIONELLA PNEUMOPHILA SEROGP 1 UR AG: L. pneumophila Serogp 1 Ur Ag: NEGATIVE

## 2021-06-12 MED ORDER — POLYETHYLENE GLYCOL 3350 17 G PO PACK
17.0000 g | PACK | Freq: Every day | ORAL | Status: DC
  Administered 2021-06-12 – 2021-06-14 (×2): 17 g via ORAL
  Filled 2021-06-12 (×2): qty 1

## 2021-06-12 MED ORDER — POTASSIUM CHLORIDE CRYS ER 20 MEQ PO TBCR
40.0000 meq | EXTENDED_RELEASE_TABLET | ORAL | Status: AC
  Administered 2021-06-12 (×2): 40 meq via ORAL
  Filled 2021-06-12 (×2): qty 2

## 2021-06-12 MED ORDER — SENNOSIDES-DOCUSATE SODIUM 8.6-50 MG PO TABS
2.0000 | ORAL_TABLET | Freq: Two times a day (BID) | ORAL | Status: DC
  Administered 2021-06-12 – 2021-06-13 (×3): 2 via ORAL
  Filled 2021-06-12 (×4): qty 2

## 2021-06-12 MED ORDER — LACTULOSE 10 GM/15ML PO SOLN
20.0000 g | Freq: Every day | ORAL | Status: DC
  Administered 2021-06-12 – 2021-06-14 (×2): 20 g via ORAL
  Filled 2021-06-12 (×2): qty 30

## 2021-06-12 NOTE — Hospital Course (Addendum)
[name] is a 81 yo female with PMH CAD, chronic fatigue, chronic sCHF, mitral regurgitation, osteoporosis, anxiety, arthritis, chronic back pain who presented after a mechanical fall resulting in a left femoral neck fracture.  She underwent left hip hemiarthroplasty on 06/07/2021 with orthopedic surgery.  She had uptrending troponins and was also evaluated by cardiology.  She was started on diuresis due to overhydration in the postop period.  Blood pressure medications were also modified due to some hypotension requiring use of midodrine.  She was also recommended to undergo heart catheterization for further evaluation during this hospitalization given her NSTEMI. She underwent LHC on 4/12 and received 2 DES.  ?

## 2021-06-12 NOTE — Progress Notes (Addendum)
? ? ?Progress Note ? ?Patient Name: [name] ?Date of Encounter: 06/12/2021 ? ?Primary Cardiologist: Rockey Situ ? ?Subjective  ? ?Feels like she has more energy today.  She worked with PT twice.  No chest pain, dyspnea, palpitations, dizziness, presyncope, or syncope.  She would like to move forward with [facility].  Hemoglobin and renal function improved.  Afternoon dose of furosemide was held in the context of relative hypotension. ? ?Inpatient Medications  ?  ?Scheduled Meds: ? calcium-vitamin D  1 tablet Oral Q breakfast  ? Chlorhexidine Gluconate Cloth  6 each Topical Q0600  ? citalopram  10 mg Oral Daily  ? clopidogrel  75 mg Oral Daily  ? enoxaparin (LOVENOX) injection  40 mg Subcutaneous Q24H  ? furosemide  40 mg Intravenous Q12H  ? lactulose  20 g Oral Daily  ? metoprolol succinate  25 mg Oral Daily  ? midodrine  5 mg Oral TID WC  ? multivitamin with minerals  1 tablet Oral Daily  ? polyethylene glycol  17 g Oral Daily  ? potassium chloride  40 mEq Oral Q4H  ? senna-docusate  2 tablet Oral BID  ? ?Continuous Infusions: ? ?PRN Meds: ?acetaminophen, ALPRAZolam, bisacodyl, ipratropium, magnesium hydroxide, menthol-cetylpyridinium **OR** phenol, nitroGLYCERIN, traMADol  ? ?Vital Signs  ?  ?Vitals:  ? 06/12/21 0728 06/12/21 1100 06/12/21 1324 06/12/21 1602  ?BP: (!) 124/53 (!) 103/50 (!) 115/56 (!) 137/51  ?Pulse: 78 76  86  ?Resp: 16 16  (!) 22  ?Temp: 98 ?F (36.7 ?C) 97.8 ?F (36.6 ?C)  (!) 97.5 ?F (36.4 ?C)  ?TempSrc:      ?SpO2: 97% 92% 95% 93%  ?Weight:      ? ? ?Intake/Output Summary (Last 24 hours) at 06/12/2021 1647 ?Last data filed at 06/12/2021 0500 ?Gross per 24 hour  ?Intake --  ?Output 1900 ml  ?Net -1900 ml  ? ? ?Filed Weights  ? 06/05/21 1652  ?Weight: 68 kg  ? ? ?Telemetry  ?  ?Not on tele - Personally Reviewed ? ?ECG  ?  ?No new tracings - Personally Reviewed ? ?Physical Exam  ? ?GEN: No acute distress.   ?Neck: No JVD. ?Cardiac: RRR, II/VI systolic murmur LSB, no rubs, or gallops.  ?Respiratory: Clear  to auscultation bilaterally.  ?GI: Soft, nontender, non-distended.   ?MS: Improving mild pretibial edema bilaterally; No deformity. ?Neuro:  Alert and oriented x 3; Nonfocal.  ?Psych: Normal affect. ? ?Labs  ?  ?Chemistry ?Recent Labs  ?Lab 06/08/21 ?2156 06/09/21 ?0017 06/12/21 ?0428  ?NA 134* 135 139  ?K 3.8 4.0 3.1*  ?CL 105 106 100  ?CO2 20* 19* 29  ?GLUCOSE 163* 110* 120*  ?BUN 32* 31* 29*  ?CREATININE 1.50* 1.38* 0.93  ?CALCIUM 6.8* 7.5* 8.6*  ?PROT 5.7* 6.4* 6.4*  ?ALBUMIN 2.8* 3.4* 3.2*  ?AST 137* 182* 39  ?ALT 78* 92* 39  ?ALKPHOS 49 44 69  ?BILITOT 0.6 0.7 1.3*  ?GFRNONAA 35* 39* >60  ?ANIONGAP 9 10 10   ? ?  ? ?Hematology ?Recent Labs  ?Lab 06/08/21 ?2156 06/09/21 ?4944 06/12/21 ?0428  ?WBC 11.3* 9.8 8.5  ?RBC 2.91* 2.86* 3.50*  ?HGB 9.1* 9.1* 11.1*  ?HCT 28.6* 28.2* 33.9*  ?MCV 98.3 98.6 96.9  ?MCH 31.3 31.8 31.7  ?MCHC 31.8 32.3 32.7  ?RDW 13.8 13.8 14.1  ?PLT 169 144* 280  ? ? ? ?Cardiac EnzymesNo results for input(s): TROPONINI in the last 168 hours. No results for input(s): TROPIPOC in the last 168 hours.  ? ?  BNP ?Recent Labs  ?Lab 06/05/21 ?1706 06/06/21 ?0350  ?BNP 820.3* 1,590.0*  ? ?  ? ?DDimer No results for input(s): DDIMER in the last 168 hours.  ? ?Radiology  ?  ?No results found. ? ?Cardiac Studies  ? ?2D echo 06/06/2021: ?1. Left ventricular ejection fraction, by estimation, is 25 to 30%. The  ?left ventricle has severely decreased function. The left ventricle  ?demonstrates global hypokinesis. The left ventricular internal cavity size  ?was mildly dilated. There is moderate  ?asymmetric left ventricular hypertrophy of the septal segment. Left  ?ventricular diastolic parameters are consistent with Grade II diastolic  ?dysfunction (pseudonormalization).  ? 2. Right ventricular systolic function is low normal. The right  ?ventricular size is mildly enlarged.  ? 3. Left atrial size was moderately dilated.  ? 4. The mitral valve is normal in structure. Moderate mitral valve  ?regurgitation.  ? 5.  The aortic valve is tricuspid. Aortic valve regurgitation is trivial. ?__________ ?  ?2D echo 06/16/2017: ?- Left ventricle: The cavity size was severely dilated. Wall  ?  thickness was normal. Systolic function was severely reduced. The  ?  estimated ejection fraction was in the range of 20% to 25%.  ?  Diffuse hypokinesis. Features are consistent with a pseudonormal  ?  left ventricular filling pattern, with concomitant abnormal  ?  relaxation and increased filling pressure (grade 2 diastolic  ?  dysfunction).  ?- Aortic valve: There was mild regurgitation.  ?- Mitral valve: There was moderate to severe regurgitation.  ?- Left atrium: The atrium was moderately dilated.  ?- Pulmonary arteries: Systolic pressure could not be accurately  ?  estimated. ?___________ ?  ?2D echo 01/23/2015: ?- Left ventricle: The cavity size was moderately dilated. Systolic  ?  function was severely reduced. The estimated ejection fraction  ?  was in the range of 20% to 25%. Diffuse hypokinesis. Regional  ?  wall motion abnormalities cannot be excluded. Doppler parameters  ?  are consistent with abnormal left ventricular relaxation (grade 1  ?  diastolic dysfunction).  ?- Aortic valve: There was mild regurgitation.  ?- Mitral valve: There was moderate regurgitation.  ?- Left atrium: The atrium was mildly dilated.  ?- Right ventricle: Systolic function was normal.  ?- Pulmonary arteries: Systolic pressure was mildly elevated. PA  ?  peak pressure: 43 mm Hg (S).  ?- Pericardium, extracardiac: A small circumferential pericardial  ?  effusion was identified, no hemodynamic compromise. ?__________ ?  ?LHC 10/27/2014: ?Ost LAD lesion, 30% stenosed. ?Ost RCA lesion, 30% stenosed. ?Ost Ramus lesion, 50% stenosed. ?Mid RCA lesion, 90% stenosed. There is a 0% residual stenosis post intervention. ?A drug-eluting stent was placed. ?Prox LAD lesion, 90% stenosed. There is a 0% residual stenosis post intervention. ?A drug-eluting stent was placed. ?  ?  Successful angioplasty and drug-eluting stent placement to both mid RCA in mid LAD. ?  ? Recommendations: ? Continue dual antiplatelets therapy for at least 12 months. Recommend aggressive treatment of risk factors. I started small dose carvedilol and lisinopril for cardiomyopathy. ?__________ ?  ?2D echo 10/25/2014: ?- Left ventricle: LVEF is severely depressed at approximately 20%  ?  with akinesis of the inferior, inferoseptal, distal anterior and  ?  apical walls; hypokinesis elsewhere. The cavity size was mildly  ?  dilated. Wall thickness was normal.  ?- Aortic valve: There was mild regurgitation.  ?- Mitral valve: There was mild to moderate regurgitation.  ?- Left atrium: The atrium was mildly dilated.  ?-  Right ventricle: Systolic function was moderately to severely  ?  reduced.  ?- Pericardium, extracardiac: A small pericardial effusion was  ?  identified. There was a left pleural effusion. ? ?Patient Profile  ?   ?81 y.o. female with history of CAD s/p PCI to the mid LAD and mid RCA in 01/2015, HFrEF secondary to ICM, mitral regurgitation, LBBB, HTN, HLD, hepatitis C, CKD stage III, chronic back pain s/p multiple surgeries, and chronic UTIs who was admitted with a left hip fracture in the setting of a mechanical fall whom we are seeing for NSTEMI.  ? ?Assessment & Plan  ?  ?1. CAD involving the native coronary arteries with NSTEMI: ?-No symptoms of angina ?-She has completed 48 hours of heparin ?-Clear liquid diet until 10 AM on 4/12 followed by n.p.o. thereafter ?-Plan for diagnostic [facility] 06/13/2021 ?-PTA Plavix and Crestor ?  ?2. HFrEF secondary to ICM: ?-Volume status improved ?-IV Lasix held at this afternoon with relative hypotension, plan for [facility] as outlined above tomorrow to further understand her hemodynamic status and evaluate for ischemic heart disease ?-Losartan held after last night's dose in an effort to allow for BP room for diuresis with plans for discontinuing midodrine there  after ?-Relative hypotension precludes resumption of ARNI or transition to ARB at this time ?-Relative hypotension and CKD precludes further escalation of GDMT, escalate as able ?-PTA Toprol ?-She remains on mid

## 2021-06-12 NOTE — Progress Notes (Addendum)
Nutrition Follow-up ? ?DOCUMENTATION CODES:  ? ?Not applicable ? ?INTERVENTION:  ? ?-Continue MVI with minerals daily ?-D/c Ensure Max ?-Liberalize diet diet to 2 gram sodium to wider variety of meal selection ? ?NUTRITION DIAGNOSIS:  ? ?Increased nutrient needs related to post-op healing as evidenced by estimated needs. ? ?Ongoing ? ?GOAL:  ? ?Patient will meet greater than or equal to 90% of their needs ? ?Progressing  ? ?MONITOR:  ? ?PO intake, Supplement acceptance, Diet advancement, Labs, Weight trends, Skin, I & O's ? ?REASON FOR ASSESSMENT:  ? ?Consult ?Assessment of nutrition requirement/status, Hip fracture protocol ? ?ASSESSMENT:  ? ?[name] is a 81 y.o. Caucasian female with medical history significant for coronary artery disease, anxiety, osteoarthritis, ischemic cardiomyopathy, left bundle branch block, moderate-severe mitral regurgitation and UTI, who presented to the ER with acute onset of accidental mechanical fall.  The patient usually uses a walker.  She got up without her walker and went to the window when she lost her balance and fell on her left side with subsequent left hip pain and inability to move.  She denies any presyncope or syncope.  No chest pain or palpitations.  No paresthesias or focal muscle weakness.  She had nausea and vomiting in the ER.  No dysuria, oliguria or hematuria or flank pain. ? ?4/6- s/p PROCEDURE: Left hip hemiarthroplasty ? ?Reviewed I/O's: -3.3 L x 24 hours and -800 ml since admission ? ?UOP: 3.5 L x 24 hours  ? ?Pt working with therapy at time of visit.  ? ?Pt with good oral intake. Noted meal completions 75-100%. Pt refusing Ensure Max.  ? ?Per TOC notes, plan to d/c to SNF on 06/14/21.  ? ?Medications reviewed and include calcium-vitamin D, lasix, miralax, senokot, and lactulose.  ? ?Labs reviewed: K: 3.1, CBGS: 155 (inpatient orders for glycemic control are none).   ? ?Diet Order:   ?Diet Order   ? ?       ?  Diet Heart Room service appropriate? Yes; Fluid  consistency: Thin  Diet effective now       ?  ? ?  ?  ? ?  ? ? ?EDUCATION NEEDS:  ? ?No education needs have been identified at this time ? ?Skin:  Skin Assessment: Skin Integrity Issues: ?Skin Integrity Issues:: Incisions ?Incisions: closed lt hip ? ?Last BM:  06/09/21 ? ?Height:  ? ?Ht Readings from Last 1 Encounters:  ?05/08/21 5' (1.524 m)  ? ? ?Weight:  ? ?Wt Readings from Last 1 Encounters:  ?06/05/21 68 kg  ? ? ?Ideal Body Weight:  45.5 kg ? ?BMI:  Body mass index is 29.29 kg/m?. ? ?Estimated Nutritional Needs:  ? ?Kcal:  1700-1900 ? ?Protein:  85-100 grams ? ?Fluid:  > 1.7 L ? ? ? ?Loistine Chance, RD, LDN, CDCES ?Registered Dietitian II ?Certified Diabetes Care and Education Specialist ?Please refer to [facility] for RD and/or RD on-call/weekend/after hours pager  ?

## 2021-06-12 NOTE — Assessment & Plan Note (Addendum)
Repleted. °

## 2021-06-12 NOTE — Assessment & Plan Note (Signed)
-   Patient complaining of constipation with no bowel movement recently ?- Takes magnesium at home with good results she says ?- Bowel regimen modified for now ?

## 2021-06-12 NOTE — TOC Progression Note (Signed)
Transition of Care (TOC) - Progression Note  ? ? ?Patient Details  ?Name: [name] ?MRN: [id number] ?Date of Birth: 06/17/1940 ? ?Transition of Care (TOC) CM/SW Contact  ?Conception Oms, RN ?Phone Number: ?06/12/2021, 1:34 PM ? ?Clinical Narrative:   Reviewed the only bed offer for the patient as [facility], She [place] want to go out of the [place] and accepted the bed offer, Holli Humbles will be started for planned to DC on Thursday ? ? ? ?  ?  ? ?Expected Discharge Plan and Services ?  ?  ?  ?  ?  ?                ?  ?  ?  ?  ?  ?  ?  ?  ?  ?  ? ? ?Social Determinants of Health (SDOH) Interventions ?  ? ?Readmission Risk Interventions ?   ? View : No data to display.  ?  ?  ?  ? ? ?

## 2021-06-12 NOTE — Assessment & Plan Note (Signed)
See NSTEMI 

## 2021-06-12 NOTE — Progress Notes (Signed)
May proceed with continued antiplatelet therapy and intraprocedural anticoagulation with PCI as needed from orthopedic perspective. Risk would be of increased chance of hematoma, but appropriate cardiac treatment would be the priority.  ? ? ?

## 2021-06-12 NOTE — Progress Notes (Signed)
Physical Therapy Treatment ?Patient Details ?Name: [name] ?MRN: [id number] ?DOB: 30-Dec-1940 ?Today's Date: 06/12/2021 ? ? ?History of Present Illness Pt is a 81 y.o. female presenting to hospital 4/4 s/p fall (lost balance walking without walker use).  Imaging showing acute comminuted displaced impacted L femoral neck fx; age-indeterminate fx of the R posterolateral 4th rib with mild displacement; multiple remote appearing thoracic compression fx's (new at T5 and T6 since prior imaging).  Pt admitted with closed L hip fx and NSTEMI.  S/p L hip hemiarthroplasty 4/6.  PMH includes CAD, anxiety, OA, ischemic cariomyopathy, L BBB, moderate-severe mitral regurgitation, UTI, CHF, Hepatitis C, chronic back pain, chronic fatigue, back sx. ? ?  ?PT Comments  ? ? Patient seen for second PT session this afternoon. She was able to stand from the recliner chair with one person assistance. She took several steps from the bed to chair with cues for technique, facilitation for weight shifting for advancement of LE, rolling walker negotiation, steadying assistance, with second person required for safety. She has increased standing tolerance to 2.5 minutes this session with distraction and encouragement. Patient very fatigued after activity. Recommend to continue PT to maximize independence to facilitate return to prior level of function. SNF recommended at discharge.  ?  ?Recommendations for follow up therapy are one component of a multi-disciplinary discharge planning process, led by the attending physician.  Recommendations may be updated based on patient status, additional functional criteria and insurance authorization. ? ?Follow Up Recommendations ? Skilled nursing-short term rehab (<3 hours/day) ?  ?  ?Assistance Recommended at Discharge Frequent or constant Supervision/Assistance  ?Patient can return home with the following Two people to help with walking and/or transfers;Two people to help with  bathing/dressing/bathroom;Assistance with cooking/housework;Direct supervision/assist for financial management;Assist for transportation;Help with stairs or ramp for entrance ?  ?Equipment Recommendations ? Rolling walker (2 wheels);BSC/3in1;Wheelchair (measurements PT);Wheelchair cushion (measurements PT);Hospital bed;Other (comment)  ?  ?Recommendations for Other Services   ? ? ?  ?Precautions / Restrictions Precautions ?Precautions: Posterior Hip;Fall ?Restrictions ?Weight Bearing Restrictions: Yes ?LLE Weight Bearing: Weight bearing as tolerated  ?  ? ?Mobility ? Bed Mobility ?Overal bed mobility: Needs Assistance ?Bed Mobility: Sit to Supine ?  ?  ?  ?Sit to supine: Max assist, +2 for physical assistance ?  ?General bed mobility comments: assistance for trunk and BLE support. verbal cues for technique and sequencing. increased time and effort required for bed mobility efforts ?  ? ?Transfers ?Overall transfer level: Needs assistance ?Equipment used: Rolling walker (2 wheels) ?Transfers: Sit to/from Stand ?Sit to Stand: Max assist ?  ?  ?  ?  ?  ?General transfer comment: lifting assistance provided with standing. cues for technique to faciliate independence including LE positioning, hand placement, technique for forward weight shifting within limits of hip precuations ?  ? ?Ambulation/Gait ?Ambulation/Gait assistance: Max assist, +2 safety/equipment (second person close by for safety with occasional Min guard needed for safety) ?Gait Distance (Feet): 2 Feet ?Assistive device: Rolling walker (2 wheels) ?Gait Pattern/deviations: Narrow base of support ?Gait velocity: decreased ?  ?  ?General Gait Details: difficulty with weight shifting and clearing feet for advancement of BLE with narrow base of support. she is easily distracted and required moderate cues for attention to task and sequencing. faciliation for weight shifting, steadying assistance, and advancement of rolling walker. activity tolerance limited by  fatigue. Sp02 in the 90's on room air after walking ? ? ?Stairs ?  ?  ?  ?  ?  ? ? ?  Wheelchair Mobility ?  ? ?Modified Rankin (Stroke Patients Only) ?  ? ? ?  ?Balance   ?  ?  ?  ?  ?Standing balance support: Bilateral upper extremity supported, During functional activity, Reliant on assistive device for balance ?Standing balance-Leahy Scale: Poor ?Standing balance comment: Min A required for static standing and Max A for dynamic activity. total standing tolerance did increase to 2.5 minutes this session with distraction and encouragement ?  ?  ?  ?  ?  ?  ?  ?  ?  ?  ?  ?  ? ?  ?Cognition Arousal/Alertness: Awake/alert ?Behavior During Therapy: [facility] for tasks assessed/performed ?Overall Cognitive Status: Within Functional Limits for tasks assessed ?  ?  ?  ?  ?  ?  ?  ?  ?  ?  ?  ?  ?  ?  ?  ?  ?General Comments: patient able to follow commands with encouragement ?  ?  ? ?  ?Exercises General Exercises - Lower Extremity ?Ankle Circles/Pumps: AROM, Strengthening, Both, 10 reps, Seated ?Short Arc Quad: AAROM, Strengthening, Both, 10 reps, Seated ?Long Arc Quad: AAROM, Strengthening, Both, 10 reps, Seated ?Hip ABduction/ADduction: AAROM, Strengthening, Both, 10 reps, Seated ?Other Exercises ?Other Exercises: verbal cues for technique for strengthening ? ?  ?General Comments   ?  ?  ? ?Pertinent Vitals/Pain Pain Assessment ?Pain Assessment: Faces ?Pain Score: 1  ?Faces Pain Scale: Hurts a little bit ?Pain Location: left hip, chronic back pain ?Pain Descriptors / Indicators: Discomfort ?Pain Intervention(s): Limited activity within patient's tolerance (offered an ice pack for left hip, patient declined)  ? ? ?Home Living   ?  ?  ?  ?  ?  ?  ?  ?  ?  ?   ?  ?Prior Function    ?  ?  ?   ? ?PT Goals (current goals can now be found in the care plan section) Acute Rehab PT Goals ?Patient Stated Goal: decreased pain ?PT Goal Formulation: With patient ?Time For Goal Achievement: 06/22/21 ?Potential to Achieve Goals:  Fair ?Progress towards PT goals: Progressing toward goals ? ?  ?Frequency ? ? ? BID ? ? ? ?  ?PT Plan Current plan remains appropriate  ? ? ?Co-evaluation PT/OT/SLP Co-Evaluation/Treatment: Yes ?Reason for Co-Treatment: To address functional/ADL transfers ?PT goals addressed during session: Mobility/safety with mobility ?OT goals addressed during session: ADL's and self-care ?  ? ?  ?AM-PAC PT "6 Clicks" Mobility   ?Outcome Measure ? Help needed turning from your back to your side while in a flat bed without using bedrails?: A Lot ?Help needed moving from lying on your back to sitting on the side of a flat bed without using bedrails?: A Lot ?Help needed moving to and from a bed to a chair (including a wheelchair)?: A Lot ?Help needed standing up from a chair using your arms (e.g., wheelchair or bedside chair)?: A Lot ?Help needed to walk in hospital room?: Total ?Help needed climbing 3-5 steps with a railing? : Total ?6 Click Score: 10 ? ?  ?End of Session Equipment Utilized During Treatment: Gait belt ?Activity Tolerance: Patient tolerated treatment well;Patient limited by fatigue ?Patient left: in bed;with call bell/phone within reach;with bed alarm set;with family/visitor present ?Nurse Communication: Mobility status ?PT Visit Diagnosis: Muscle weakness (generalized) (M62.81);Difficulty in walking, not elsewhere classified (R26.2);Other abnormalities of gait and mobility (R26.89);Pain;Unsteadiness on feet (R26.81) ?Pain - Right/Left: Left ?Pain - part of body: Hip ?  ? ? ?  Time: 3559-7416 ?PT Time Calculation (min) (ACUTE ONLY): 24 min ? ?Charges:   ?$Therapeutic Activity: 8-22 mins          ?          ? ?Minna Merritts, PT, MPT ? ? ? ?Percell Locus ?06/12/2021, 3:02 PM ? ?

## 2021-06-12 NOTE — Progress Notes (Signed)
?Progress Note ? ? ? ?[name]   ?SWF:[id number]  ?DOB: Jul 31, 1940  ?DOA: 06/05/2021     7 ?PCP: McLean-Scocuzza, Nino Glow, MD ? ?Initial CC: fall at home ? ?Hospital Course: ?[name] is a 81 yo female with PMH CAD, chronic fatigue, chronic sCHF, mitral regurgitation, osteoporosis, anxiety, arthritis, chronic back pain who presented after a mechanical fall resulting in a left femoral neck fracture.  She underwent left hip hemiarthroplasty on 06/07/2021 with orthopedic surgery.  She had uptrending troponins and was also evaluated by cardiology.  She was started on diuresis due to overhydration in the postop period.  Blood pressure medications were also modified due to some hypotension requiring use of midodrine.  She was also recommended to undergo heart catheterization for further evaluation during this hospitalization given her NSTEMI. ? ?Interval History:  ?Sitting up in chair bedside this morning.  Husband present bedside as well.  She is now amenable for undergoing heart catheterization during hospitalization.  She denied any chest pain or shortness of breath this morning.  Hip pain is as expected although she has had difficulty working much with physical therapy and progression has been slow. ? ?Assessment and Plan: ?* Closed left hip fracture (HCC) ?- s/p mechanical fall ?- underwent left hip hemiarthroplasty on 06/07/2021 ?- Okay for use of antiplatelet therapy and anticoagulation therapy per orthopedic surgery in setting of potential need for PCI ?-Continue working with physical therapy.  Ultimately she will discharge to SNF once medical work-up is completed ? ?Non-ST elevation (NSTEMI) myocardial infarction ([place]) ?- was initially started on IV heparin with uptrending trops ?- cardiology following as well ?- patient now amenable to heart cath; schedule to be determined  ?- continue plavix, toprol ?- statin seems to have been held previously due to elevated LFTs ? ?Acute on chronic HFrEF (heart failure with  reduced ejection fraction) ([place]) ?- Entresto discontinued due to soft blood pressures ?-Cardiology following ?- Continue Lasix as blood pressure able to tolerate ?- Continue Toprol ?-Echo this hospitalization shows EF 25 to 30%, global hypokinesis, grade 2 diastolic dysfunction, mildly enlarged RV; LVH ? ?Constipation ?- Patient complaining of constipation with no bowel movement recently ?- Takes magnesium at home with good results she says ?- Bowel regimen modified for now ? ?Hypokalemia ?- Replete as needed ? ?Elevated troponin ?- See NSTEMI ? ?Hyperlipidemia ?- Statin on hold until LFTs remain stable after normalizing ? ?Essential hypertension ?- soft BP, requiring midodrine ?- meds modified as needed ?- currently on toprol and lasix ?- entresto on hold; ARB to be determined  ? ? ? ?Old records reviewed in assessment of this patient ? ?Antimicrobials: ? ? ?DVT prophylaxis:  ?enoxaparin (LOVENOX) injection 40 mg Start: 06/08/21 2200 ?SCDs Start: 06/07/21 1423 ? ? ?Code Status:   Code Status: Full Code ? ?Disposition Plan: SNF in 2 to 3 days ?Status is: Inpatient ? ?Objective: ?Blood pressure (!) 115/56, pulse 76, temperature 97.8 ?F (36.6 ?C), resp. rate 16, weight 68 kg, SpO2 95 %.  ?Examination:  ?Physical Exam ?Constitutional:   ?   Appearance: Normal appearance.  ?HENT:  ?   Head: Normocephalic and atraumatic.  ?   Mouth/Throat:  ?   Mouth: Mucous membranes are moist.  ?Eyes:  ?   Extraocular Movements: Extraocular movements intact.  ?Cardiovascular:  ?   Rate and Rhythm: Normal rate and regular rhythm.  ?Abdominal:  ?   General: Bowel sounds are normal. There is no distension.  ?   Palpations: Abdomen is  soft.  ?   Tenderness: There is no abdominal tenderness.  ?Musculoskeletal:     ?   General: Normal range of motion.  ?   Cervical back: Normal range of motion and neck supple.  ?   Comments: Left hip surgical dressings in place.  Compartments soft but swollen.  Tender but appropriate  ?Skin: ?   General:  Skin is warm and dry.  ?Neurological:  ?   General: No focal deficit present.  ?   Mental Status: She is alert and oriented to person, place, and time.  ?Psychiatric:     ?   Mood and Affect: Mood normal.     ?   Behavior: Behavior normal.  ?  ? ?Consultants:  ?Orthopedic surgery ?Cardiology ? ?Procedures:  ?Left hip hemiarthroplasty, 06/07/2021 ? ?Data Reviewed: ?Results for orders placed or performed during the hospital encounter of 06/05/21 (from the past 24 hour(s))  ?Comprehensive metabolic panel     Status: Abnormal  ? Collection Time: 06/12/21  4:28 AM  ?Result Value Ref Range  ? Sodium 139 135 - 145 mmol/L  ? Potassium 3.1 (L) 3.5 - 5.1 mmol/L  ? Chloride 100 98 - 111 mmol/L  ? CO2 29 22 - 32 mmol/L  ? Glucose, Bld 120 (H) 70 - 99 mg/dL  ? BUN 29 (H) 8 - 23 mg/dL  ? Creatinine, Ser 0.93 0.44 - 1.00 mg/dL  ? Calcium 8.6 (L) 8.9 - 10.3 mg/dL  ? Total Protein 6.4 (L) 6.5 - 8.1 g/dL  ? Albumin 3.2 (L) 3.5 - 5.0 g/dL  ? AST 39 15 - 41 U/L  ? ALT 39 0 - 44 U/L  ? Alkaline Phosphatase 69 38 - 126 U/L  ? Total Bilirubin 1.3 (H) 0.3 - 1.2 mg/dL  ? GFR, Estimated >60 >60 mL/min  ? Anion gap 10 5 - 15  ?CBC     Status: Abnormal  ? Collection Time: 06/12/21  4:28 AM  ?Result Value Ref Range  ? WBC 8.5 4.0 - 10.5 K/uL  ? RBC 3.50 (L) 3.87 - 5.11 MIL/uL  ? Hemoglobin 11.1 (L) 12.0 - 15.0 g/dL  ? HCT 33.9 (L) 36.0 - 46.0 %  ? MCV 96.9 80.0 - 100.0 fL  ? MCH 31.7 26.0 - 34.0 pg  ? MCHC 32.7 30.0 - 36.0 g/dL  ? RDW 14.1 11.5 - 15.5 %  ? Platelets 280 150 - 400 K/uL  ? nRBC 0.6 (H) 0.0 - 0.2 %  ?  ?I have Reviewed nursing notes, Vitals, and Lab results since pt's last encounter. Pertinent lab results : see above ?I have ordered test including BMP, CBC, Mg ?I have reviewed the last note from staff over past 24 hours ?I have discussed pt's care plan and test results with nursing staff, case manager ? ? LOS: 7 days  ? ?Dwyane Dee, MD ?[facility] ?06/12/2021, 3:58 PM ? ?

## 2021-06-12 NOTE — Progress Notes (Signed)
Physical Therapy Treatment ?Patient Details ?Name: [name] ?MRN: [id number] ?DOB: May 10, 1940 ?Today's Date: 06/12/2021 ? ? ?History of Present Illness Pt is a 81 y.o. female presenting to hospital 4/4 s/p fall (lost balance walking without walker use).  Imaging showing acute comminuted displaced impacted L femoral neck fx; age-indeterminate fx of the R posterolateral 4th rib with mild displacement; multiple remote appearing thoracic compression fx's (new at T5 and T6 since prior imaging).  Pt admitted with closed L hip fx and NSTEMI.  S/p L hip hemiarthroplasty 4/6.  PMH includes CAD, anxiety, OA, ischemic cariomyopathy, L BBB, moderate-severe mitral regurgitation, UTI, CHF, Hepatitis C, chronic back pain, chronic fatigue, back sx. ? ?  ?PT Comments  ? ? Patient sitting up in chair on arrival to room. She reports 1/10 pain in left hip with chronic back pain. She is fatigued with minimal activity. She did progress to standing with one person assistance. Standing tolerance of 20 seconds using rolling walker for support and encouragement provided. She also participated in seated LE exercises. Recommend to continue PT to maximize independence and facilitate return to prior level of function. SNF recommended at discharge.  ?  ?Recommendations for follow up therapy are one component of a multi-disciplinary discharge planning process, led by the attending physician.  Recommendations may be updated based on patient status, additional functional criteria and insurance authorization. ? ?Follow Up Recommendations ? Skilled nursing-short term rehab (<3 hours/day) ?  ?  ?Assistance Recommended at Discharge Frequent or constant Supervision/Assistance  ?Patient can return home with the following Two people to help with walking and/or transfers;Two people to help with bathing/dressing/bathroom;Assistance with cooking/housework;Direct supervision/assist for financial management;Assist for transportation;Help with stairs or ramp  for entrance ?  ?Equipment Recommendations ? Rolling walker (2 wheels);BSC/3in1;Wheelchair (measurements PT);Wheelchair cushion (measurements PT);Hospital bed;Other (comment)  ?  ?Recommendations for Other Services   ? ? ?  ?Precautions / Restrictions Precautions ?Precautions: Posterior Hip;Fall ?Restrictions ?Weight Bearing Restrictions: Yes ?LLE Weight Bearing: Weight bearing as tolerated  ?  ? ?Mobility ? Bed Mobility ?  ?  ?  ?  ?  ?  ?  ?General bed mobility comments: not assessed as patient sitting up on arrival and post session ?  ? ?Transfers ?Overall transfer level: Needs assistance ?Equipment used: Rolling walker (2 wheels) ?Transfers: Sit to/from Stand ?Sit to Stand: Max assist ?  ?  ?  ?  ?  ?General transfer comment: lifting and lowering assistance provided with standing. cues for technique to faciliate independence including LE positioning, hand placement, technique for forward weight shifting within limits of hip precuations ?  ? ?Ambulation/Gait ?  ?  ?  ?  ?  ?  ?  ?General Gait Details: unable to at this time, unwilling to try standing a second time due to fatigue ? ? ?Stairs ?  ?  ?  ?  ?  ? ? ?Wheelchair Mobility ?  ? ?Modified Rankin (Stroke Patients Only) ?  ? ? ?  ?Balance   ?  ?  ?  ?  ?Standing balance support: Bilateral upper extremity supported, During functional activity, Reliant on assistive device for balance ?Standing balance-Leahy Scale: Poor ?Standing balance comment: Min A with faciliation for hip extension and upright standing posture. standing tolerance 20 seconds with maximal encouragement ?  ?  ?  ?  ?  ?  ?  ?  ?  ?  ?  ?  ? ?  ?Cognition Arousal/Alertness: Awake/alert ?Behavior During Therapy: [facility] for tasks assessed/performed,  Anxious ?Overall Cognitive Status: Within Functional Limits for tasks assessed ?  ?  ?  ?  ?  ?  ?  ?  ?  ?  ?  ?  ?  ?  ?  ?  ?General Comments: patient able to follow commands with encouragement ?  ?  ? ?  ?Exercises General Exercises - Lower  Extremity ?Ankle Circles/Pumps: AROM, Strengthening, Both, 10 reps, Seated ?Short Arc Quad: AAROM, Strengthening, Both, 10 reps, Seated ?Long Arc Quad: AAROM, Strengthening, Both, 10 reps, Seated ?Hip ABduction/ADduction: AAROM, Strengthening, Both, 10 reps, Seated ?Other Exercises ?Other Exercises: verbal cues for technique for strengthening ? ?  ?General Comments   ?  ?  ? ?Pertinent Vitals/Pain Pain Assessment ?Pain Assessment: 0-10 ?Pain Score: 1  ?Pain Location: left hip, chronic back pain ?Pain Descriptors / Indicators: Discomfort ?Pain Intervention(s): Limited activity within patient's tolerance  ? ? ?Home Living   ?  ?  ?  ?  ?  ?  ?  ?  ?  ?   ?  ?Prior Function    ?  ?  ?   ? ?PT Goals (current goals can now be found in the care plan section) Acute Rehab PT Goals ?Patient Stated Goal: decreased pain ?PT Goal Formulation: With patient ?Time For Goal Achievement: 06/22/21 ?Potential to Achieve Goals: Fair ?Progress towards PT goals: Progressing toward goals ? ?  ?Frequency ? ? ? BID ? ? ? ?  ?PT Plan Current plan remains appropriate  ? ? ?Co-evaluation   ?  ?  ?  ?  ? ?  ?AM-PAC PT "6 Clicks" Mobility   ?Outcome Measure ? Help needed turning from your back to your side while in a flat bed without using bedrails?: A Lot ?Help needed moving from lying on your back to sitting on the side of a flat bed without using bedrails?: A Lot ?Help needed moving to and from a bed to a chair (including a wheelchair)?: A Lot ?Help needed standing up from a chair using your arms (e.g., wheelchair or bedside chair)?: A Lot ?Help needed to walk in hospital room?: Total ?Help needed climbing 3-5 steps with a railing? : Total ?6 Click Score: 10 ? ?  ?End of Session Equipment Utilized During Treatment: Gait belt ?Activity Tolerance: Patient tolerated treatment well;Patient limited by pain ?Patient left: in chair;with call bell/phone within reach;with chair alarm set;with family/visitor present ?Nurse Communication: Mobility  status ?PT Visit Diagnosis: Muscle weakness (generalized) (M62.81);Difficulty in walking, not elsewhere classified (R26.2);Other abnormalities of gait and mobility (R26.89);Pain;Unsteadiness on feet (R26.81) ?Pain - Right/Left: Left ?Pain - part of body: Hip ?  ? ? ?Time: 1021-1050 ?PT Time Calculation (min) (ACUTE ONLY): 29 min ? ?Charges:  $Therapeutic Exercise: 8-22 mins ?$Therapeutic Activity: 8-22 mins          ?          ? ?Minna Merritts, PT, MPT ? ? ?[name] ?06/12/2021, 1:04 PM ? ?

## 2021-06-12 NOTE — Progress Notes (Signed)
Occupational Therapy Treatment ?Patient Details ?Name: [name] ?MRN: [id number] ?DOB: [birth year] ?Today's Date: 06/12/2021 ? ? ?History of present illness Pt is a 81 y.o. female presenting to hospital 4/4 s/p fall (lost balance walking without walker use).  Imaging showing acute comminuted displaced impacted L femoral neck fx; age-indeterminate fx of the R posterolateral 4th rib with mild displacement; multiple remote appearing thoracic compression fx's (new at T5 and T6 since prior imaging).  Pt admitted with closed L hip fx and NSTEMI.  S/p L hip hemiarthroplasty 4/6.  PMH includes CAD, anxiety, OA, ischemic cariomyopathy, L BBB, moderate-severe mitral regurgitation, UTI, CHF, Hepatitis C, chronic back pain, chronic fatigue, back sx. ?  ?OT comments ? Pt seen for OT and PT co-tx to address ADL transfer training. Pt in recliner and agreeable with encouragement. Pt required MAX A +2 for safety with VC for sequencing/hand placement and maintaining precautions with movement. With distraction once in standing, pt tolerated standing for 2 1/2 minutes total including pivot to EOB. Appears anxious and endorses extreme fatigue with limited standing and attempts to sit prior to being back against EOB requiring MAX VC and ultimately increased assist to sit EOB safely. Pt instructed in posterior THPs and how to maintain during LB ADL tasks, positioning of BLE in bed, encouragement given to support "baby steps" towards larger goals to be independent. Pt progressing albeit slowly to OT goals. Continues to benefit from skilled OT services and SNF remains most appropriate for additional therapy upon discharge.   ? ?Recommendations for follow up therapy are one component of a multi-disciplinary discharge planning process, led by the attending physician.  Recommendations may be updated based on patient status, additional functional criteria and insurance authorization. ?   ?Follow Up Recommendations ? Skilled nursing-short term  rehab (<3 hours/day)  ?  ?Assistance Recommended at Discharge Frequent or constant Supervision/Assistance  ?Patient can return home with the following ? Two people to help with walking and/or transfers;A lot of help with bathing/dressing/bathroom;Assistance with cooking/housework;Assist for transportation ?  ?Equipment Recommendations ? BSC/3in1  ?  ?Recommendations for Other Services   ? ?  ?Precautions / Restrictions Precautions ?Precautions: Posterior Hip;Fall ?Precaution Comments: comp fractures ?Restrictions ?Weight Bearing Restrictions: Yes ?LLE Weight Bearing: Weight bearing as tolerated  ? ? ?  ? ?Mobility Bed Mobility ?Overal bed mobility: Needs Assistance ?Bed Mobility: Sit to Supine ?  ?  ?  ?Sit to supine: Max assist, +2 for physical assistance ?  ?General bed mobility comments: assistance for trunk and BLE support. verbal cues for technique and sequencing. increased time and effort required for bed mobility efforts ?  ? ?Transfers ?Overall transfer level: Needs assistance ?Equipment used: Rolling walker (2 wheels) ?Transfers: Sit to/from Stand ?Sit to Stand: Max assist, +2 safety/equipment ?  ?  ?  ?  ?  ?General transfer comment: +2 for safety, VC for LE positioning, hand placement, sequencing with precautions, encouragement ?  ?  ?Balance Overall balance assessment: Needs assistance ?Sitting-balance support: Bilateral upper extremity supported, Feet supported ?Sitting balance-Leahy Scale: Poor ?  ?Postural control: Posterior lean ?Standing balance support: Bilateral upper extremity supported, During functional activity, Reliant on assistive device for balance ?Standing balance-Leahy Scale: Poor ?Standing balance comment: Min A required for static standing and Max A for dynamic activity. total standing tolerance did increase to 2.5 minutes this session with distraction and encouragement ?  ?  ?  ?  ?  ?  ?  ?  ?  ?  ?  ?   ? ?  ADL either performed or assessed with clinical judgement  ? ?ADL   ?  ?  ?  ?   ?  ?  ?  ?  ?  ?  ?  ?  ?  ?  ?  ?  ?  ?  ?  ?  ?  ? ?Extremity/Trunk Assessment   ?  ?  ?  ?  ?  ? ?Vision   ?  ?  ?Perception   ?  ?Praxis   ?  ? ?Cognition Arousal/Alertness: Awake/alert ?Behavior During Therapy: Anxious ?Overall Cognitive Status: Within Functional Limits for tasks assessed ?  ?  ?  ?  ?  ?  ?  ?  ?  ?  ?  ?  ?  ?  ?  ?  ?General Comments: better with encouragement, distraction ?  ?  ?   ?Exercises Other Exercises ?Other Exercises: pt instructed in posterior THPs and how to maintain during LB ADL tasks, positioning of BLE in bed, encouragement given to support "baby steps" towards larger goals to be independent ? ?  ?Shoulder Instructions   ? ? ?  ?General Comments    ? ? ?Pertinent Vitals/ Pain       Pain Assessment ?Pain Assessment: Faces ?Faces Pain Scale: Hurts a little bit ?Pain Location: left hip, chronic back pain ?Pain Descriptors / Indicators: Discomfort ?Pain Intervention(s): Limited activity within patient's tolerance, Monitored during session, Repositioned ? ?Home Living   ?  ?  ?  ?  ?  ?  ?  ?  ?  ?  ?  ?  ?  ?  ?  ?  ?  ?  ? ?  ?Prior Functioning/Environment    ?  ?  ?  ?   ? ?Frequency ? Min 2X/week  ? ? ? ? ?  ?Progress Toward Goals ? ?OT Goals(current goals can now be found in the care plan section) ? Progress towards OT goals: Progressing toward goals ? ?Acute Rehab OT Goals ?Patient Stated Goal: pt would like to be able to walk again, take care of self and go home eventually with husband ?OT Goal Formulation: With patient/family ?Time For Goal Achievement: 06/23/21 ?Potential to Achieve Goals: Good  ?Plan Discharge plan remains appropriate;Frequency remains appropriate   ? ?Co-evaluation ? ? ? PT/OT/SLP Co-Evaluation/Treatment: Yes ?Reason for Co-Treatment: For patient/therapist safety;To address functional/ADL transfers ?PT goals addressed during session: Mobility/safety with mobility ?OT goals addressed during session: ADL's and self-care ?  ? ?  ?AM-PAC OT "6 Clicks" Daily  Activity     ?Outcome Measure ? ? Help from another person eating meals?: None ?Help from another person taking care of personal grooming?: A Little ?Help from another person toileting, which includes using toliet, bedpan, or urinal?: Total ?Help from another person bathing (including washing, rinsing, drying)?: Total ?Help from another person to put on and taking off regular upper body clothing?: A Little ?Help from another person to put on and taking off regular lower body clothing?: Total ?6 Click Score: 13 ? ?  ?End of Session Equipment Utilized During Treatment: Rolling walker (2 wheels);Gait belt ? ?OT Visit Diagnosis: Muscle weakness (generalized) (M62.81);Unsteadiness on feet (R26.81);Pain ?Pain - Right/Left: Left ?Pain - part of body: Hip ?  ?Activity Tolerance Patient limited by fatigue ?  ?Patient Left in bed;with call bell/phone within reach;with bed alarm set;with family/visitor present ?  ?Nurse Communication   ?  ? ?   ? ?Time: 0109-3235 ?OT Time Calculation (  min): 24 min ? ?Charges: OT General Charges ?$OT Visit: 1 Visit ?OT Treatments ?$Self Care/Home Management : 8-22 mins ? ?Ardeth Perfect., MPH, MS, OTR/L ?ascom [phone] ?06/12/21, 4:56 PM ? ?

## 2021-06-12 NOTE — H&P (View-Only) (Signed)
? ? ?Progress Note ? ?Patient Name: [name] ?Date of Encounter: 06/12/2021 ? ?Primary Cardiologist: Rockey Situ ? ?Subjective  ? ?Feels like she has more energy today.  She worked with PT twice.  No chest pain, dyspnea, palpitations, dizziness, presyncope, or syncope.  She would like to move forward with [facility].  Hemoglobin and renal function improved.  Afternoon dose of furosemide was held in the context of relative hypotension. ? ?Inpatient Medications  ?  ?Scheduled Meds: ? calcium-vitamin D  1 tablet Oral Q breakfast  ? Chlorhexidine Gluconate Cloth  6 each Topical Q0600  ? citalopram  10 mg Oral Daily  ? clopidogrel  75 mg Oral Daily  ? enoxaparin (LOVENOX) injection  40 mg Subcutaneous Q24H  ? furosemide  40 mg Intravenous Q12H  ? lactulose  20 g Oral Daily  ? metoprolol succinate  25 mg Oral Daily  ? midodrine  5 mg Oral TID WC  ? multivitamin with minerals  1 tablet Oral Daily  ? polyethylene glycol  17 g Oral Daily  ? potassium chloride  40 mEq Oral Q4H  ? senna-docusate  2 tablet Oral BID  ? ?Continuous Infusions: ? ?PRN Meds: ?acetaminophen, ALPRAZolam, bisacodyl, ipratropium, magnesium hydroxide, menthol-cetylpyridinium **OR** phenol, nitroGLYCERIN, traMADol  ? ?Vital Signs  ?  ?Vitals:  ? 06/12/21 0728 06/12/21 1100 06/12/21 1324 06/12/21 1602  ?BP: (!) 124/53 (!) 103/50 (!) 115/56 (!) 137/51  ?Pulse: 78 76  86  ?Resp: 16 16  (!) 22  ?Temp: 98 ?F (36.7 ?C) 97.8 ?F (36.6 ?C)  (!) 97.5 ?F (36.4 ?C)  ?TempSrc:      ?SpO2: 97% 92% 95% 93%  ?Weight:      ? ? ?Intake/Output Summary (Last 24 hours) at 06/12/2021 1647 ?Last data filed at 06/12/2021 0500 ?Gross per 24 hour  ?Intake --  ?Output 1900 ml  ?Net -1900 ml  ? ? ?Filed Weights  ? 06/05/21 1652  ?Weight: 68 kg  ? ? ?Telemetry  ?  ?Not on tele - Personally Reviewed ? ?ECG  ?  ?No new tracings - Personally Reviewed ? ?Physical Exam  ? ?GEN: No acute distress.   ?Neck: No JVD. ?Cardiac: RRR, II/VI systolic murmur LSB, no rubs, or gallops.  ?Respiratory: Clear  to auscultation bilaterally.  ?GI: Soft, nontender, non-distended.   ?MS: Improving mild pretibial edema bilaterally; No deformity. ?Neuro:  Alert and oriented x 3; Nonfocal.  ?Psych: Normal affect. ? ?Labs  ?  ?Chemistry ?Recent Labs  ?Lab 06/08/21 ?2156 06/09/21 ?1245 06/12/21 ?0428  ?NA 134* 135 139  ?K 3.8 4.0 3.1*  ?CL 105 106 100  ?CO2 20* 19* 29  ?GLUCOSE 163* 110* 120*  ?BUN 32* 31* 29*  ?CREATININE 1.50* 1.38* 0.93  ?CALCIUM 6.8* 7.5* 8.6*  ?PROT 5.7* 6.4* 6.4*  ?ALBUMIN 2.8* 3.4* 3.2*  ?AST 137* 182* 39  ?ALT 78* 92* 39  ?ALKPHOS 49 44 69  ?BILITOT 0.6 0.7 1.3*  ?GFRNONAA 35* 39* >60  ?ANIONGAP 9 10 10   ? ?  ? ?Hematology ?Recent Labs  ?Lab 06/08/21 ?2156 06/09/21 ?8099 06/12/21 ?0428  ?WBC 11.3* 9.8 8.5  ?RBC 2.91* 2.86* 3.50*  ?HGB 9.1* 9.1* 11.1*  ?HCT 28.6* 28.2* 33.9*  ?MCV 98.3 98.6 96.9  ?MCH 31.3 31.8 31.7  ?MCHC 31.8 32.3 32.7  ?RDW 13.8 13.8 14.1  ?PLT 169 144* 280  ? ? ? ?Cardiac EnzymesNo results for input(s): TROPONINI in the last 168 hours. No results for input(s): TROPIPOC in the last 168 hours.  ? ?  BNP ?Recent Labs  ?Lab 06/05/21 ?1706 06/06/21 ?0350  ?BNP 820.3* 1,590.0*  ? ?  ? ?DDimer No results for input(s): DDIMER in the last 168 hours.  ? ?Radiology  ?  ?No results found. ? ?Cardiac Studies  ? ?2D echo 06/06/2021: ?1. Left ventricular ejection fraction, by estimation, is 25 to 30%. The  ?left ventricle has severely decreased function. The left ventricle  ?demonstrates global hypokinesis. The left ventricular internal cavity size  ?was mildly dilated. There is moderate  ?asymmetric left ventricular hypertrophy of the septal segment. Left  ?ventricular diastolic parameters are consistent with Grade II diastolic  ?dysfunction (pseudonormalization).  ? 2. Right ventricular systolic function is low normal. The right  ?ventricular size is mildly enlarged.  ? 3. Left atrial size was moderately dilated.  ? 4. The mitral valve is normal in structure. Moderate mitral valve  ?regurgitation.  ? 5.  The aortic valve is tricuspid. Aortic valve regurgitation is trivial. ?__________ ?  ?2D echo 06/16/2017: ?- Left ventricle: The cavity size was severely dilated. Wall  ?  thickness was normal. Systolic function was severely reduced. The  ?  estimated ejection fraction was in the range of 20% to 25%.  ?  Diffuse hypokinesis. Features are consistent with a pseudonormal  ?  left ventricular filling pattern, with concomitant abnormal  ?  relaxation and increased filling pressure (grade 2 diastolic  ?  dysfunction).  ?- Aortic valve: There was mild regurgitation.  ?- Mitral valve: There was moderate to severe regurgitation.  ?- Left atrium: The atrium was moderately dilated.  ?- Pulmonary arteries: Systolic pressure could not be accurately  ?  estimated. ?___________ ?  ?2D echo 01/23/2015: ?- Left ventricle: The cavity size was moderately dilated. Systolic  ?  function was severely reduced. The estimated ejection fraction  ?  was in the range of 20% to 25%. Diffuse hypokinesis. Regional  ?  wall motion abnormalities cannot be excluded. Doppler parameters  ?  are consistent with abnormal left ventricular relaxation (grade 1  ?  diastolic dysfunction).  ?- Aortic valve: There was mild regurgitation.  ?- Mitral valve: There was moderate regurgitation.  ?- Left atrium: The atrium was mildly dilated.  ?- Right ventricle: Systolic function was normal.  ?- Pulmonary arteries: Systolic pressure was mildly elevated. PA  ?  peak pressure: 43 mm Hg (S).  ?- Pericardium, extracardiac: A small circumferential pericardial  ?  effusion was identified, no hemodynamic compromise. ?__________ ?  ?LHC 10/27/2014: ?Ost LAD lesion, 30% stenosed. ?Ost RCA lesion, 30% stenosed. ?Ost Ramus lesion, 50% stenosed. ?Mid RCA lesion, 90% stenosed. There is a 0% residual stenosis post intervention. ?A drug-eluting stent was placed. ?Prox LAD lesion, 90% stenosed. There is a 0% residual stenosis post intervention. ?A drug-eluting stent was placed. ?  ?  Successful angioplasty and drug-eluting stent placement to both mid RCA in mid LAD. ?  ? Recommendations: ? Continue dual antiplatelets therapy for at least 12 months. Recommend aggressive treatment of risk factors. I started small dose carvedilol and lisinopril for cardiomyopathy. ?__________ ?  ?2D echo 10/25/2014: ?- Left ventricle: LVEF is severely depressed at approximately 20%  ?  with akinesis of the inferior, inferoseptal, distal anterior and  ?  apical walls; hypokinesis elsewhere. The cavity size was mildly  ?  dilated. Wall thickness was normal.  ?- Aortic valve: There was mild regurgitation.  ?- Mitral valve: There was mild to moderate regurgitation.  ?- Left atrium: The atrium was mildly dilated.  ?-  Right ventricle: Systolic function was moderately to severely  ?  reduced.  ?- Pericardium, extracardiac: A small pericardial effusion was  ?  identified. There was a left pleural effusion. ? ?Patient Profile  ?   ?81 y.o. female with history of CAD s/p PCI to the mid LAD and mid RCA in 01/2015, HFrEF secondary to ICM, mitral regurgitation, LBBB, HTN, HLD, hepatitis C, CKD stage III, chronic back pain s/p multiple surgeries, and chronic UTIs who was admitted with a left hip fracture in the setting of a mechanical fall whom we are seeing for NSTEMI.  ? ?Assessment & Plan  ?  ?1. CAD involving the native coronary arteries with NSTEMI: ?-No symptoms of angina ?-She has completed 48 hours of heparin ?-Clear liquid diet until 10 AM on 4/12 followed by n.p.o. thereafter ?-Plan for diagnostic [facility] 06/13/2021 ?-PTA Plavix and Crestor ?  ?2. HFrEF secondary to ICM: ?-Volume status improved ?-IV Lasix held at this afternoon with relative hypotension, plan for [facility] as outlined above tomorrow to further understand her hemodynamic status and evaluate for ischemic heart disease ?-Losartan held after last night's dose in an effort to allow for BP room for diuresis with plans for discontinuing midodrine there  after ?-Relative hypotension precludes resumption of ARNI or transition to ARB at this time ?-Relative hypotension and CKD precludes further escalation of GDMT, escalate as able ?-PTA Toprol ?-She remains on mid

## 2021-06-12 NOTE — Progress Notes (Addendum)
1330 ?Pt weaned of O2.on room air sat reading 95% sitting up in chair educated on IS can reach 1000 ? ?1334 ?Dr Sabino Gasser made aware that pt b/p 115/56 and K+ 3.1 verbal order to hold lasix for now. ? ? ?

## 2021-06-13 ENCOUNTER — Encounter
Admission: EM | Disposition: A | Discharge status: Skilled Nursing Facility | Payer: Self-pay | Source: Home / Self Care | Attending: Internal Medicine

## 2021-06-13 DIAGNOSIS — I214 Non-ST elevation (NSTEMI) myocardial infarction: Secondary | ICD-10-CM | POA: Diagnosis not present

## 2021-06-13 DIAGNOSIS — S72002A Fracture of unspecified part of neck of left femur, initial encounter for closed fracture: Secondary | ICD-10-CM | POA: Diagnosis not present

## 2021-06-13 DIAGNOSIS — I2510 Atherosclerotic heart disease of native coronary artery without angina pectoris: Secondary | ICD-10-CM

## 2021-06-13 DIAGNOSIS — N189 Chronic kidney disease, unspecified: Secondary | ICD-10-CM

## 2021-06-13 DIAGNOSIS — G9341 Metabolic encephalopathy: Secondary | ICD-10-CM

## 2021-06-13 DIAGNOSIS — N179 Acute kidney failure, unspecified: Secondary | ICD-10-CM

## 2021-06-13 DIAGNOSIS — I5023 Acute on chronic systolic (congestive) heart failure: Secondary | ICD-10-CM | POA: Diagnosis not present

## 2021-06-13 DIAGNOSIS — K5900 Constipation, unspecified: Secondary | ICD-10-CM | POA: Diagnosis not present

## 2021-06-13 HISTORY — PX: CORONARY STENT INTERVENTION: CATH118234

## 2021-06-13 HISTORY — PX: RIGHT/LEFT HEART CATH AND CORONARY ANGIOGRAPHY: CATH118266

## 2021-06-13 HISTORY — PX: INTRAVASCULAR PRESSURE WIRE/FFR STUDY: CATH118243

## 2021-06-13 LAB — BASIC METABOLIC PANEL
Anion gap: 9 (ref 5–15)
BUN: 22 mg/dL (ref 8–23)
CO2: 29 mmol/L (ref 22–32)
Calcium: 9.1 mg/dL (ref 8.9–10.3)
Chloride: 100 mmol/L (ref 98–111)
Creatinine, Ser: 0.86 mg/dL (ref 0.44–1.00)
GFR, Estimated: >60 mL/min (ref >60)
Glucose, Bld: 101 mg/dL — ABNORMAL HIGH (ref 70–99)
Potassium: 4.3 mmol/L (ref 3.5–5.1)
Sodium: 138 mmol/L (ref 135–145)

## 2021-06-13 LAB — POCT ACTIVATED CLOTTING TIME
Activated Clotting Time: 245 seconds
Activated Clotting Time: 287 seconds
Activated Clotting Time: 281 seconds
Activated Clotting Time: 281 seconds

## 2021-06-13 LAB — CBC WITH DIFFERENTIAL/PLATELET
Abs Immature Granulocytes: 0.07 10*3/uL (ref 0.00–0.07)
Basophils Absolute: 0 10*3/uL (ref 0.0–0.1)
Basophils Relative: 1 %
Eosinophils Absolute: 0.2 10*3/uL (ref 0.0–0.5)
Eosinophils Relative: 2 %
HCT: 32.8 % — ABNORMAL LOW (ref 36.0–46.0)
Hemoglobin: 10.6 g/dL — ABNORMAL LOW (ref 12.0–15.0)
Immature Granulocytes: 1 %
Lymphocytes Relative: 24 %
Lymphs Abs: 2.1 10*3/uL (ref 0.7–4.0)
MCH: 31.9 pg (ref 26.0–34.0)
MCHC: 32.3 g/dL (ref 30.0–36.0)
MCV: 98.8 fL (ref 80.0–100.0)
Monocytes Absolute: 0.6 10*3/uL (ref 0.1–1.0)
Monocytes Relative: 7 %
Neutro Abs: 5.7 10*3/uL (ref 1.7–7.7)
Neutrophils Relative %: 65 %
Platelets: 287 10*3/uL (ref 150–400)
RBC: 3.32 MIL/uL — ABNORMAL LOW (ref 3.87–5.11)
RDW: 14.7 % (ref 11.5–15.5)
Smear Review: NORMAL
WBC: 8.7 10*3/uL (ref 4.0–10.5)
nRBC: 0.3 % — ABNORMAL HIGH (ref 0.0–0.2)

## 2021-06-13 LAB — MAGNESIUM: Magnesium: 2.2 mg/dL (ref 1.7–2.4)

## 2021-06-13 SURGERY — RIGHT/LEFT HEART CATH AND CORONARY ANGIOGRAPHY
Anesthesia: Moderate Sedation

## 2021-06-13 MED ORDER — LIDOCAINE HCL (PF) 1 % IJ SOLN
INTRAMUSCULAR | Status: DC | PRN
  Administered 2021-06-13 (×2): 2 mL

## 2021-06-13 MED ORDER — SODIUM CHLORIDE 0.9% FLUSH
3.0000 mL | Freq: Two times a day (BID) | INTRAVENOUS | Status: DC
  Administered 2021-06-14 (×2): 3 mL via INTRAVENOUS

## 2021-06-13 MED ORDER — CLOPIDOGREL BISULFATE 75 MG PO TABS
ORAL_TABLET | ORAL | Status: AC
  Filled 2021-06-13: qty 4

## 2021-06-13 MED ORDER — ASPIRIN 81 MG PO CHEW
CHEWABLE_TABLET | ORAL | Status: AC
  Filled 2021-06-13: qty 4

## 2021-06-13 MED ORDER — MIDAZOLAM HCL 2 MG/2ML IJ SOLN
INTRAMUSCULAR | Status: AC
  Filled 2021-06-13: qty 2

## 2021-06-13 MED ORDER — SODIUM CHLORIDE 0.9% FLUSH: 3.0000 mL | INTRAVENOUS | Status: DC | PRN

## 2021-06-13 MED ORDER — HEPARIN SODIUM (PORCINE) 1000 UNIT/ML IJ SOLN
INTRAMUSCULAR | Status: AC
  Filled 2021-06-13: qty 10

## 2021-06-13 MED ORDER — LIDOCAINE HCL 1 % IJ SOLN
INTRAMUSCULAR | Status: AC
  Filled 2021-06-13: qty 20

## 2021-06-13 MED ORDER — NITROGLYCERIN 1 MG/10 ML FOR IR/CATH LAB
INTRA_ARTERIAL | Status: AC
  Filled 2021-06-13: qty 10

## 2021-06-13 MED ORDER — SODIUM CHLORIDE 0.9 % IV SOLN: 250.0000 mL | INTRAVENOUS | Status: DC | PRN

## 2021-06-13 MED ORDER — FENTANYL CITRATE (PF) 100 MCG/2ML IJ SOLN
INTRAMUSCULAR | Status: DC | PRN
  Administered 2021-06-13 (×5): 12.5 ug via INTRAVENOUS

## 2021-06-13 MED ORDER — HEPARIN (PORCINE) IN NACL 1000-0.9 UT/500ML-% IV SOLN
INTRAVENOUS | Status: AC
  Filled 2021-06-13: qty 1000

## 2021-06-13 MED ORDER — SODIUM CHLORIDE 0.9 % IV SOLN: INTRAVENOUS | Status: DC

## 2021-06-13 MED ORDER — HEPARIN SODIUM (PORCINE) 1000 UNIT/ML IJ SOLN
INTRAMUSCULAR | Status: DC | PRN
  Administered 2021-06-13: 2000 [IU] via INTRAVENOUS
  Administered 2021-06-13: 3500 [IU] via INTRAVENOUS
  Administered 2021-06-13: 2000 [IU] via INTRAVENOUS
  Administered 2021-06-13: 3500 [IU] via INTRAVENOUS

## 2021-06-13 MED ORDER — VERAPAMIL HCL 2.5 MG/ML IV SOLN
INTRAVENOUS | Status: AC
  Filled 2021-06-13: qty 2

## 2021-06-13 MED ORDER — NITROGLYCERIN 1 MG/10 ML FOR IR/CATH LAB
INTRA_ARTERIAL | Status: DC | PRN
Start: 2021-06-13 — End: 2021-06-13
  Administered 2021-06-13: 100 ug via INTRACORONARY
  Administered 2021-06-13: 200 ug via INTRA_ARTERIAL
  Administered 2021-06-13: 200 ug via INTRACORONARY
  Administered 2021-06-13: 100 ug via INTRACORONARY

## 2021-06-13 MED ORDER — FENTANYL CITRATE (PF) 100 MCG/2ML IJ SOLN
INTRAMUSCULAR | Status: AC
  Filled 2021-06-13: qty 2

## 2021-06-13 MED ORDER — METOPROLOL SUCCINATE ER 25 MG PO TB24
12.5000 mg | ORAL_TABLET | Freq: Every day | ORAL | Status: DC
  Administered 2021-06-14 – 2021-06-15 (×2): 12.5 mg via ORAL
  Filled 2021-06-13 (×2): qty 1

## 2021-06-13 MED ORDER — ENOXAPARIN SODIUM 40 MG/0.4ML IJ SOSY
40.0000 mg | PREFILLED_SYRINGE | INTRAMUSCULAR | Status: DC
  Administered 2021-06-14 – 2021-06-15 (×2): 40 mg via SUBCUTANEOUS
  Filled 2021-06-13 (×4): qty 0.4

## 2021-06-13 MED ORDER — IOHEXOL 300 MG/ML  SOLN
INTRAMUSCULAR | Status: DC | PRN
Start: 2021-06-13 — End: 2021-06-13
  Administered 2021-06-13: 190 mL

## 2021-06-13 MED ORDER — HYDRALAZINE HCL 20 MG/ML IJ SOLN: 10.0000 mg | INTRAMUSCULAR | Status: AC | PRN

## 2021-06-13 MED ORDER — MIDAZOLAM HCL 2 MG/2ML IJ SOLN
INTRAMUSCULAR | Status: DC | PRN
  Administered 2021-06-13 (×5): .5 mg via INTRAVENOUS

## 2021-06-13 MED ORDER — HEPARIN (PORCINE) IN NACL 1000-0.9 UT/500ML-% IV SOLN
INTRAVENOUS | Status: AC
Start: 2021-06-13 — End: ?
  Filled 2021-06-13: qty 500

## 2021-06-13 MED ORDER — CLOPIDOGREL BISULFATE 75 MG PO TABS
ORAL_TABLET | ORAL | Status: DC | PRN
Start: 2021-06-13 — End: 2021-06-13
  Administered 2021-06-13: 300 mg via ORAL

## 2021-06-13 MED ORDER — ASPIRIN 81 MG PO CHEW
CHEWABLE_TABLET | ORAL | Status: DC | PRN
  Administered 2021-06-13: 324 mg via ORAL

## 2021-06-13 MED ORDER — HEPARIN (PORCINE) IN NACL 1000-0.9 UT/500ML-% IV SOLN
INTRAVENOUS | Status: DC | PRN
  Administered 2021-06-13: 1000 mL
  Administered 2021-06-13: 500 mL

## 2021-06-13 MED ORDER — ASPIRIN 81 MG PO CHEW
81.0000 mg | CHEWABLE_TABLET | Freq: Every day | ORAL | Status: DC
  Administered 2021-06-14 – 2021-06-15 (×2): 81 mg via ORAL
  Filled 2021-06-13 (×2): qty 1

## 2021-06-13 MED ORDER — VERAPAMIL HCL 2.5 MG/ML IV SOLN
INTRAVENOUS | Status: DC | PRN
  Administered 2021-06-13 (×2): 2.5 mg via INTRA_ARTERIAL

## 2021-06-13 MED ORDER — SODIUM CHLORIDE 0.9% FLUSH: 3.0000 mL | Freq: Two times a day (BID) | INTRAVENOUS | Status: DC

## 2021-06-13 SURGICAL SUPPLY — 31 items
BALLN EUPHORA RX 2.0X12 (BALLOONS) ×2
BALLN TREK RX 2.25X12 (BALLOONS) ×2
BALLN ~~LOC~~ TREK RX 3.0X12 (BALLOONS) ×2
BALLN ~~LOC~~ TREK RX 3.25X12 (BALLOONS) ×2
BALLOON EUPHORA RX 2.0X12 (BALLOONS) IMPLANT
BALLOON TREK RX 2.25X12 (BALLOONS) IMPLANT
BALLOON ~~LOC~~ TREK RX 3.0X12 (BALLOONS) IMPLANT
BALLOON ~~LOC~~ TREK RX 3.25X12 (BALLOONS) IMPLANT
CATH 5FR JL3.5 JR4 ANG PIG MP (CATHETERS) ×1 IMPLANT
CATH BALLN WEDGE 5F 110CM (CATHETERS) ×1 IMPLANT
CATH LAUNCHER 5F EBU3.0 (CATHETERS) IMPLANT
CATH VISTA GUIDE 6FR XB3 (CATHETERS) ×1 IMPLANT
CATHETER LAUNCHER 5F EBU3.0 (CATHETERS) ×4
DEVICE RAD TR BAND REGULAR (VASCULAR PRODUCTS) ×1 IMPLANT
DRAPE BRACHIAL (DRAPES) ×2 IMPLANT
GLIDESHEATH SLEND SS 6F .021 (SHEATH) ×1 IMPLANT
GUIDEWIRE INQWIRE 1.5J.035X260 (WIRE) IMPLANT
GUIDEWIRE PRESS OMNI 185 ST (WIRE) ×1 IMPLANT
INQWIRE 1.5J .035X260CM (WIRE) ×2
KIT ENCORE 26 ADVANTAGE (KITS) ×1 IMPLANT
PACK CARDIAC CATH (CUSTOM PROCEDURE TRAY) ×3 IMPLANT
PAD ELECT DEFIB RADIOL ZOLL (MISCELLANEOUS) ×1 IMPLANT
PROTECTION STATION PRESSURIZED (MISCELLANEOUS) ×2
SET ATX SIMPLICITY (MISCELLANEOUS) ×1 IMPLANT
SHEATH GLIDE SLENDER 4/5FR (SHEATH) ×1 IMPLANT
STATION PROTECTION PRESSURIZED (MISCELLANEOUS) IMPLANT
STENT ONYX FRONTIER 2.5X15 (Permanent Stent) ×1 IMPLANT
STENT ONYX FRONTIER 3.0X12 (Permanent Stent) ×1 IMPLANT
WIRE G HI TQ BMW 190 (WIRE) ×1 IMPLANT
WIRE HITORQ VERSACORE ST 145CM (WIRE) ×1 IMPLANT
WIRE RUNTHROUGH .014X180CM (WIRE) ×1 IMPLANT

## 2021-06-13 NOTE — Telephone Encounter (Signed)
Called and advised patient there bones will not deteriorate overnight she can heel from her surgery and then injection can be given. ?

## 2021-06-13 NOTE — Plan of Care (Signed)

## 2021-06-13 NOTE — Assessment & Plan Note (Signed)
-   patient symptoms include lethargy ?- etiology considered due to metabolic effect from pain meds noted on 4/7 ?- mentation has improved and remained stable ? ?

## 2021-06-13 NOTE — Progress Notes (Signed)
Physical Therapy Treatment ?Patient Details ?Name: [name] ?MRN: [id number] ?DOB: 03-05-1940 ?Today's Date: 06/13/2021 ? ? ?History of Present Illness Pt is a 81 y.o. female presenting to hospital 4/4 s/p fall (lost balance walking without walker use).  Imaging showing acute comminuted displaced impacted L femoral neck fx; age-indeterminate fx of the R posterolateral 4th rib with mild displacement; multiple remote appearing thoracic compression fx's (new at T5 and T6 since prior imaging).  Pt admitted with closed L hip fx and NSTEMI.  S/p L hip hemiarthroplasty 4/6.  PMH includes CAD, anxiety, OA, ischemic cariomyopathy, L BBB, moderate-severe mitral regurgitation, UTI, CHF, Hepatitis C, chronic back pain, chronic fatigue, back sx. ? ?  ?PT Comments  ? ? Pt was long sitting in bed with supportive spouse at bedside. Pt has scheduled cardiac cath later this date but pt was agreeable to AM PT session. She was able to exit L side of bed with increased time and mod-max assist. Sat EOB with supervision. Was able to stand and pivot to recliner with use of RW and max assist of one. Increased time to perform due to pain/ anxiety. Pt is improving, however slowly. STR at DC is most appropriate. Will return to assist pt back to bed prior to cardiac cath this afternoon.  ?  ?Recommendations for follow up therapy are one component of a multi-disciplinary discharge planning process, led by the attending physician.  Recommendations may be updated based on patient status, additional functional criteria and insurance authorization. ? ?Follow Up Recommendations ? Skilled nursing-short term rehab (<3 hours/day) ?  ?  ?Assistance Recommended at Discharge Frequent or constant Supervision/Assistance  ?Patient can return home with the following A lot of help with walking and/or transfers;A lot of help with bathing/dressing/bathroom;Assistance with cooking/housework;Assistance with feeding;Direct supervision/assist for medications  management;Direct supervision/assist for financial management;Assist for transportation;Help with stairs or ramp for entrance ?  ?Equipment Recommendations ? Rolling walker (2 wheels);BSC/3in1;Wheelchair (measurements PT);Wheelchair cushion (measurements PT);Hospital bed;Other (comment)  ?  ?   ?Precautions / Restrictions Precautions ?Precautions: Posterior Hip;Fall ?Precaution Booklet Issued: Yes (comment) ?Precaution Comments: comp fractures ?Restrictions ?Weight Bearing Restrictions: Yes ?LLE Weight Bearing: Weight bearing as tolerated  ?  ? ?Mobility ? Bed Mobility ?Overal bed mobility: Needs Assistance ?Bed Mobility: Supine to Sit ? Supine to sit: Mod assist, Max assist, HOB elevated ?  ?General bed mobility comments: pt required increased time and alot of Vcs for improved technique. Did use bedsheet to assist Leg towards EOB. ?  ? ?Transfers ?Overall transfer level: Needs assistance ?Equipment used: Rolling walker (2 wheels) ?Transfers: Sit to/from Stand ?Sit to Stand: Mod assist, Max assist, From elevated surface ?Stand pivot transfers: Mod assist, Max assist, From elevated surface ?  ?  ?  ?  ?General transfer comment: pt was able to stand EOB and pivot towards recliner. increased time to perform with vcs throughout for sequencing and safety. pt does not clear LEs but was able to pivot to safely. Pt has anxiety in standing but with relaxation cues and cues for improved technique, was able to perform. ?  ? ?Ambulation/Gait ?   ?General Gait Details: unable to clear LE to advance to taking actual steps however was able to tolerate wt shift to pivot on L/R. ? ? ?  ?Balance Overall balance assessment: Needs assistance ?Sitting-balance support: Bilateral upper extremity supported, Feet supported ?Sitting balance-Leahy Scale: Fair ?Sitting balance - Comments: pt sat EOB x several minutes with supervision only ?  ?Standing balance support: Bilateral upper extremity  supported, During functional activity, Reliant on  assistive device for balance ?Standing balance-Leahy Scale: Poor ?Standing balance comment: pt was able to static stand with min assist however required mod-max during dynamic task. mostly limited by pain/anxiety ?  ?  ?  ?  ?Cognition Arousal/Alertness: Awake/alert ?Behavior During Therapy: Anxious ?Overall Cognitive Status: Within Functional Limits for tasks assessed ?  ?   ?General Comments: Pt is A and O x 4 ?  ?  ? ?  ?Exercises General Exercises - Lower Extremity ?Ankle Circles/Pumps: AROM, Strengthening, Both, 10 reps ?Quad Sets: AROM, 10 reps ?Gluteal Sets: 10 reps ? ?  ?   ? ?Pertinent Vitals/Pain Pain Assessment ?Pain Assessment: 0-10 ?Pain Score: 6  ?Faces Pain Scale: Hurts a little bit ?Pain Location: left hip, chronic back pain ?Pain Descriptors / Indicators: Discomfort ?Pain Intervention(s): Limited activity within patient's tolerance, Monitored during session, Repositioned  ? ? ? ?PT Goals (current goals can now be found in the care plan section) Acute Rehab PT Goals ?Patient Stated Goal: get better and go home ?Progress towards PT goals: Progressing toward goals ? ?  ?Frequency ? ? ? BID ? ? ? ?  ?PT Plan Current plan remains appropriate  ? ? ?Co-evaluation   ?  ?PT goals addressed during session: Mobility/safety with mobility;Proper use of DME;Strengthening/ROM;Balance ?  ?  ? ?  ?AM-PAC PT "6 Clicks" Mobility   ?Outcome Measure ? Help needed turning from your back to your side while in a flat bed without using bedrails?: A Lot ?Help needed moving from lying on your back to sitting on the side of a flat bed without using bedrails?: A Lot ?Help needed moving to and from a bed to a chair (including a wheelchair)?: A Lot ?Help needed standing up from a chair using your arms (e.g., wheelchair or bedside chair)?: A Lot ?Help needed to walk in hospital room?: Total ?Help needed climbing 3-5 steps with a railing? : Total ?6 Click Score: 10 ? ?  ?End of Session   ?Activity Tolerance: Patient tolerated  treatment well ?Patient left: in chair;with call bell/phone within reach;with chair alarm set;with family/visitor present ?Nurse Communication: Mobility status ?PT Visit Diagnosis: Muscle weakness (generalized) (M62.81);Difficulty in walking, not elsewhere classified (R26.2);Other abnormalities of gait and mobility (R26.89);Pain;Unsteadiness on feet (R26.81) ?Pain - Right/Left: Left ?Pain - part of body: Hip ?  ? ? ?Time: 3299-2426 ?PT Time Calculation (min) (ACUTE ONLY): 31 min ? ?Charges:  $Therapeutic Exercise: 8-22 mins ?$Therapeutic Activity: 8-22 mins          ?          ? ?Julaine Fusi PTA ?06/13/21, 8:38 AM  ? ?

## 2021-06-13 NOTE — Progress Notes (Signed)
Physical Therapy Treatment ?Patient Details ?Name: [name] ?MRN: [id number] ?DOB: 23-Dec-1940 ?Today's Date: 06/13/2021 ? ? ?History of Present Illness Pt is a 81 y.o. female presenting to hospital 4/4 s/p fall (lost balance walking without walker use).  Imaging showing acute comminuted displaced impacted L femoral neck fx; age-indeterminate fx of the R posterolateral 4th rib with mild displacement; multiple remote appearing thoracic compression fx's (new at T5 and T6 since prior imaging).  Pt admitted with closed L hip fx and NSTEMI.  S/p L hip hemiarthroplasty 4/6.  PMH includes CAD, anxiety, OA, ischemic cariomyopathy, L BBB, moderate-severe mitral regurgitation, UTI, CHF, Hepatitis C, chronic back pain, chronic fatigue, back sx. ? ?  ?PT Comments  ? ? Chief Strategy Officer was contacted by RN staff about transport coming to take pt to procedure. Author assist pt with getting from recliner to bed. Max assist + max encouragement required. Pt is anxious and c/o severe pain with any/all movements. She is progressing overall however slowly. Acute PT will continue to follow and progress as able per current POC. Highly recommend DC to rehab to address deficits while assisting pt to PLOF. She will benefit from continued skilled PT to improve strength, mobility, and safety with OOB activity.  ?  ?Recommendations for follow up therapy are one component of a multi-disciplinary discharge planning process, led by the attending physician.  Recommendations may be updated based on patient status, additional functional criteria and insurance authorization. ? ?Follow Up Recommendations ? Skilled nursing-short term rehab (<3 hours/day) ?  ?  ?Assistance Recommended at Discharge Frequent or constant Supervision/Assistance  ?Patient can return home with the following A lot of help with walking and/or transfers;A lot of help with bathing/dressing/bathroom;Assistance with cooking/housework;Assistance with feeding;Direct supervision/assist for  medications management;Direct supervision/assist for financial management;Assist for transportation;Help with stairs or ramp for entrance ?  ?Equipment Recommendations ? Rolling walker (2 wheels);BSC/3in1;Wheelchair (measurements PT);Wheelchair cushion (measurements PT);Hospital bed;Other (comment)  ?  ?   ?Precautions / Restrictions Precautions ?Precautions: Posterior Hip;Fall ?Precaution Booklet Issued: Yes (comment) ?Precaution Comments: comp fractures ?Restrictions ?Weight Bearing Restrictions: Yes ?LLE Weight Bearing: Weight bearing as tolerated  ?  ? ?Mobility ? Bed Mobility ?Overal bed mobility: Needs Assistance ?Bed Mobility: Sit to Supine ?  ?  ?Supine to sit: Mod assist, Max assist, HOB elevated ?Sit to supine: Max assist, +2 for physical assistance, +2 for safety/equipment ?  ?General bed mobility comments: pt required more assistance to return to supine than she did to exit bed. she is severely limited by pain and anxiety. transportation arrived to take her to her cardiac cath procedure. ?  ? ?Transfers ?Overall transfer level: Needs assistance ?Equipment used: Rolling walker (2 wheels) ?Transfers: Sit to/from Stand ?Sit to Stand: Max assist ?Stand pivot transfers: Max assist ?  ?  ?  ?  ?General transfer comment: max assist of one to stand from lower recliner height. Vcs throughout for technique, encouragement, and overall improved safety. pt was able to pivot back to EOB however required prolonged amount of time and max vcs and assist. ?  ? ?Ambulation/Gait ?   ?General Gait Details: continues to be unable to lift/progress to taking steps. Was able to pivot from recliner> EOB ? ? ?  ?Balance Overall balance assessment: Needs assistance ?Sitting-balance support: Bilateral upper extremity supported, Feet supported ?Sitting balance-Leahy Scale: Fair ?Sitting balance - Comments: pt sat EOB x several minutes with supervision only ?  ?Standing balance support: Bilateral upper extremity supported, During  functional activity, Reliant on assistive device  for balance ?Standing balance-Leahy Scale: Poor ?Standing balance comment: pt was able to static stand with min assist however required mod-max during dynamic task. mostly limited by pain/anxiety ?  ?  ?  ?Cognition Arousal/Alertness: Awake/alert ?Behavior During Therapy: Anxious ?Overall Cognitive Status: Within Functional Limits for tasks assessed ?  ?   ?General Comments: Pt is A and O x 4 ?  ?  ? ?  ?Exercises General Exercises - Lower Extremity ?Ankle Circles/Pumps: AROM, Strengthening, Both, 10 reps ?Quad Sets: AROM, 10 reps ?Gluteal Sets: 10 reps ? ?  ?General Comments General comments (skin integrity, edema, etc.): pt is leaving floor for cardiac cath. PT will return next date and continue to follow per current POC. ?  ?  ? ?Pertinent Vitals/Pain Pain Assessment ?Pain Assessment: 0-10 ?Pain Score: 8  ?Faces Pain Scale: Hurts whole lot ?Pain Location: left hip, chronic back pain ?Pain Descriptors / Indicators: Discomfort ?Pain Intervention(s): Limited activity within patient's tolerance, Monitored during session, Premedicated before session  ? ? ? ?PT Goals (current goals can now be found in the care plan section) Acute Rehab PT Goals ?Patient Stated Goal: get better and go home ?Progress towards PT goals: Progressing toward goals (slow progress due to pain/anxiety) ? ?  ?Frequency ? ? ? BID ? ? ? ?  ?PT Plan Current plan remains appropriate  ? ? ?Co-evaluation   ?  ?PT goals addressed during session: Mobility/safety with mobility;Balance;Proper use of DME;Strengthening/ROM ?  ?  ? ?  ?AM-PAC PT "6 Clicks" Mobility   ?Outcome Measure ? Help needed turning from your back to your side while in a flat bed without using bedrails?: A Lot ?Help needed moving from lying on your back to sitting on the side of a flat bed without using bedrails?: A Lot ?Help needed moving to and from a bed to a chair (including a wheelchair)?: A Lot ?Help needed standing up from a chair  using your arms (e.g., wheelchair or bedside chair)?: A Lot ?Help needed to walk in hospital room?: Total ?Help needed climbing 3-5 steps with a railing? : Total ?6 Click Score: 10 ? ?  ?End of Session   ?Activity Tolerance: Patient tolerated treatment well ?Patient left: in bed;with bed alarm set;with call bell/phone within reach;Other (comment) (transport in room to take pt for procedure) ?Nurse Communication: Mobility status ?PT Visit Diagnosis: Muscle weakness (generalized) (M62.81);Difficulty in walking, not elsewhere classified (R26.2);Other abnormalities of gait and mobility (R26.89);Pain;Unsteadiness on feet (R26.81) ?Pain - Right/Left: Left ?Pain - part of body: Hip ?  ? ? ?Time: 9833-8250 ?PT Time Calculation (min) (ACUTE ONLY): 8 min ? ?Charges:  $Therapeutic Exercise: 8-22 mins ?$Therapeutic Activity: 8-22 mins          ?          ? ?Julaine Fusi PTA ?06/13/21, 11:50 AM  ? ?

## 2021-06-13 NOTE — Interval H&P Note (Signed)
History and Physical Interval Note: ? ?06/13/2021 ?11:51 AM ? ?[name]  has presented today for surgery, with the diagnosis of Non-ST segment myocardial infarction and acute on chronic HFrEF.  The various methods of treatment have been discussed with the patient and family. After consideration of risks, benefits and other options for treatment, the patient has consented to  Procedure(s): ?RIGHT/LEFT HEART CATH AND CORONARY ANGIOGRAPHY (N/A) as a surgical intervention.  The patient's history has been reviewed, patient examined, no change in status, stable for surgery.  I have reviewed the patient's chart and labs.  Questions were answered to the patient's satisfaction.   ? ?Cath Lab Visit (complete for each Cath Lab visit) ? ?Clinical Evaluation Leading to the Procedure:  ? ?ACS: Yes.   ? ?Non-ACS:  N/A ? ?[name] ? ? ?

## 2021-06-13 NOTE — Progress Notes (Addendum)
?Progress Note ? ? ? ?[name]   ?KZS:[id number]  ?DOB: May 07, 1940  ?DOA: 06/05/2021     8 ?PCP: McLean-Scocuzza, Nino Glow, MD ? ?Initial CC: fall at home ? ?Hospital Course: ?Ms. Schmuck is a 81 yo female with PMH CAD, chronic fatigue, chronic sCHF, mitral regurgitation, osteoporosis, anxiety, arthritis, chronic back pain who presented after a mechanical fall resulting in a left femoral neck fracture.  She underwent left hip hemiarthroplasty on 06/07/2021 with orthopedic surgery.  She had uptrending troponins and was also evaluated by cardiology.  She was started on diuresis due to overhydration in the postop period.  Blood pressure medications were also modified due to some hypotension requiring use of midodrine.  She was also recommended to undergo heart catheterization for further evaluation during this hospitalization given her NSTEMI. ? ?Interval History:  ?No events overnight.  Seen this morning prior to going down for cath.  Husband present bedside.  She was able to stand a little longer today when transferring to the chair.  But still unable to take steps yet; mostly just pivoting to the chair from the bed with use of rolling walker and 1 person assist.  Plan will be for rehab at discharge [facility] to [facility]. ? ?Assessment and Plan: ?* Closed left hip fracture (HCC) ?- s/p mechanical fall ?- underwent left hip hemiarthroplasty on 06/07/2021 ?- Okay for use of antiplatelet therapy and anticoagulation therapy per orthopedic surgery in setting of potential need for PCI ?-Continue working with physical therapy.  Ultimately she will discharge to SNF once medical work-up is completed ? ?Non-ST elevation (NSTEMI) myocardial infarction ([place]) ?- was initially started on IV heparin with uptrending trops ?- cardiology following as well ?- undergoing heart cath 4/12, follow up results  ?- continue plavix, toprol ?- statin seems to have been held previously due to elevated LFTs ?- repeat LFTs in am ? ?Acute on chronic HFrEF  (heart failure with reduced ejection fraction) ([place]) ?- Entresto discontinued due to soft blood pressures ?-Cardiology following ?- Continue Lasix as blood pressure able to tolerate ?- Continue Toprol ?-Echo this hospitalization shows EF 25 to 30%, global hypokinesis, grade 2 diastolic dysfunction, mildly enlarged RV; LVH ? ?Constipation ?- Patient complaining of constipation with no bowel movement recently ?- Takes magnesium at home with good results she says ?- Bowel regimen modified for now ? ?Hypokalemia ?- Replete as needed ? ?Elevated troponin ?- See NSTEMI ? ?Hyperlipidemia ?- Statin on hold until LFTs remain stable after normalizing ? ?Essential hypertension ?- soft BP, requiring midodrine ?- meds modified as needed ?- currently on toprol and lasix ?- entresto on hold; ARB to be determined  ? ?Acute metabolic encephalopathy-resolved as of 06/13/2021 ?- patient symptoms include lethargy ?- etiology considered due to metabolic effect from pain meds noted on 4/7 ?- mentation has improved and remained stable ? ? ?AKI (acute kidney injury) (HCC)-resolved as of 06/13/2021 ?- baseline creatinine ~ 1 ?- patient presents with increase in creat >0.3 mg/dL above baseline, creat increase >1.5x baseline presumed to have occurred within past 7 days PTA ?- has improved with diuresis given volume overload/CRS ? ? ? ? ?Old records reviewed in assessment of this patient ? ?Antimicrobials: ? ? ?DVT prophylaxis:  ?enoxaparin (LOVENOX) injection 40 mg Start: 06/14/21 0800 ?SCDs Start: 06/07/21 1423 ? ? ?Code Status:   Code Status: Full Code ? ?Disposition Plan: [place] [facility] possibly by Thursday or Friday ?Status is: Inpatient ? ?Objective: ?Blood pressure (!) 94/56, pulse 82, temperature 98.4 ?F (  36.9 ?C), temperature source Oral, resp. rate (!) 25, weight 68 kg, SpO2 96 %.  ?Examination:  ?Physical Exam ?Constitutional:   ?   Appearance: Normal appearance.  ?HENT:  ?   Head: Normocephalic and atraumatic.  ?   Mouth/Throat:  ?    Mouth: Mucous membranes are moist.  ?Eyes:  ?   Extraocular Movements: Extraocular movements intact.  ?Cardiovascular:  ?   Rate and Rhythm: Normal rate and regular rhythm.  ?Abdominal:  ?   General: Bowel sounds are normal. There is no distension.  ?   Palpations: Abdomen is soft.  ?   Tenderness: There is no abdominal tenderness.  ?Musculoskeletal:     ?   General: Normal range of motion.  ?   Cervical back: Normal range of motion and neck supple.  ?   Comments: Left hip surgical dressings in place.  Compartments soft but swollen.  Tender but appropriate  ?Skin: ?   General: Skin is warm and dry.  ?Neurological:  ?   General: No focal deficit present.  ?   Mental Status: She is alert and oriented to person, place, and time.  ?Psychiatric:     ?   Mood and Affect: Mood normal.     ?   Behavior: Behavior normal.  ?  ? ?Consultants:  ?Orthopedic surgery ?Cardiology ? ?Procedures:  ?Left hip hemiarthroplasty, 06/07/2021 ?Left heart cath, 06/13/2021 ? ?Data Reviewed: ?Results for orders placed or performed during the hospital encounter of 06/05/21 (from the past 24 hour(s))  ?Basic metabolic panel     Status: Abnormal  ? Collection Time: 06/13/21  5:11 AM  ?Result Value Ref Range  ? Sodium 138 135 - 145 mmol/L  ? Potassium 4.3 3.5 - 5.1 mmol/L  ? Chloride 100 98 - 111 mmol/L  ? CO2 29 22 - 32 mmol/L  ? Glucose, Bld 101 (H) 70 - 99 mg/dL  ? BUN 22 8 - 23 mg/dL  ? Creatinine, Ser 0.86 0.44 - 1.00 mg/dL  ? Calcium 9.1 8.9 - 10.3 mg/dL  ? GFR, Estimated >60 >60 mL/min  ? Anion gap 9 5 - 15  ?CBC with Differential/Platelet     Status: Abnormal  ? Collection Time: 06/13/21  5:11 AM  ?Result Value Ref Range  ? WBC 8.7 4.0 - 10.5 K/uL  ? RBC 3.32 (L) 3.87 - 5.11 MIL/uL  ? Hemoglobin 10.6 (L) 12.0 - 15.0 g/dL  ? HCT 32.8 (L) 36.0 - 46.0 %  ? MCV 98.8 80.0 - 100.0 fL  ? MCH 31.9 26.0 - 34.0 pg  ? MCHC 32.3 30.0 - 36.0 g/dL  ? RDW 14.7 11.5 - 15.5 %  ? Platelets 287 150 - 400 K/uL  ? nRBC 0.3 (H) 0.0 - 0.2 %  ? Neutrophils Relative  % 65 %  ? Neutro Abs 5.7 1.7 - 7.7 K/uL  ? Lymphocytes Relative 24 %  ? Lymphs Abs 2.1 0.7 - 4.0 K/uL  ? Monocytes Relative 7 %  ? Monocytes Absolute 0.6 0.1 - 1.0 K/uL  ? Eosinophils Relative 2 %  ? Eosinophils Absolute 0.2 0.0 - 0.5 K/uL  ? Basophils Relative 1 %  ? Basophils Absolute 0.0 0.0 - 0.1 K/uL  ? WBC Morphology MORPHOLOGY UNREMARKABLE   ? RBC Morphology MORPHOLOGY UNREMARKABLE   ? Smear Review Normal platelet morphology   ? Immature Granulocytes 1 %  ? Abs Immature Granulocytes 0.07 0.00 - 0.07 K/uL  ?Magnesium     Status: None  ? Collection Time:  06/13/21  5:11 AM  ?Result Value Ref Range  ? Magnesium 2.2 1.7 - 2.4 mg/dL  ?POCT Activated clotting time     Status: None  ? Collection Time: 06/13/21 12:57 PM  ?Result Value Ref Range  ? Activated Clotting Time 287 seconds  ?POCT Activated clotting time     Status: None  ? Collection Time: 06/13/21  1:21 PM  ?Result Value Ref Range  ? Activated Clotting Time 245 seconds  ?POCT Activated clotting time     Status: None  ? Collection Time: 06/13/21  1:34 PM  ?Result Value Ref Range  ? Activated Clotting Time 281 seconds  ?POCT Activated clotting time     Status: None  ? Collection Time: 06/13/21  2:08 PM  ?Result Value Ref Range  ? Activated Clotting Time 281 seconds  ?  ?I have Reviewed nursing notes, Vitals, and Lab results since pt's last encounter. Pertinent lab results : see above ?I have ordered test including BMP, CBC, Mg ?I have reviewed the last note from staff over past 24 hours ?I have discussed pt's care plan and test results with nursing staff, case manager ? ? LOS: 8 days  ? ?Dwyane Dee, MD ?[facility] ?06/13/2021, 4:12 PM ? ?

## 2021-06-13 NOTE — Progress Notes (Signed)
1056 ?Report given to aaron in vascular. Pt having a cath done today at 2pm ?

## 2021-06-13 NOTE — Assessment & Plan Note (Addendum)
-   baseline creatinine ~ 1 ?- patient presents with increase in creat >0.3 mg/dL above baseline, creat increase >1.5x baseline presumed to have occurred within past 7 days PTA ?- has improved with diuresis given volume overload/CRS ? ?

## 2021-06-13 NOTE — Progress Notes (Signed)
? ?Progress Note ? ?Patient Name: [name] ?Date of Encounter: 06/13/2021 ? ?[facility] Cardiologist: Ida Rogue, MD  ? ?Subjective  ? ?Feeling well other than right forearm soreness following cath/PCI today.  No chest pain or shortness of breath. ? ?Inpatient Medications  ?  ?Scheduled Meds: ? [START ON 06/14/2021] aspirin  81 mg Oral Daily  ? [MAR Hold] calcium-vitamin D  1 tablet Oral Q breakfast  ? [MAR Hold] Chlorhexidine Gluconate Cloth  6 each Topical Q0600  ? [MAR Hold] citalopram  10 mg Oral Daily  ? [MAR Hold] clopidogrel  75 mg Oral Daily  ? [START ON 06/14/2021] enoxaparin (LOVENOX) injection  40 mg Subcutaneous Q24H  ? [MAR Hold] furosemide  40 mg Intravenous Q12H  ? [MAR Hold] lactulose  20 g Oral Daily  ? [START ON 06/14/2021] metoprolol succinate  12.5 mg Oral Daily  ? [MAR Hold] midodrine  5 mg Oral TID WC  ? [MAR Hold] multivitamin with minerals  1 tablet Oral Daily  ? [MAR Hold] polyethylene glycol  17 g Oral Daily  ? [MAR Hold] senna-docusate  2 tablet Oral BID  ? sodium chloride flush  3 mL Intravenous Q12H  ? ?Continuous Infusions: ? sodium chloride    ? ?PRN Meds: ?sodium chloride, acetaminophen, [MAR Hold] ALPRAZolam, [MAR Hold] bisacodyl, hydrALAZINE, [MAR Hold] ipratropium, [MAR Hold] magnesium hydroxide, [MAR Hold] menthol-cetylpyridinium **OR** [MAR Hold] phenol, [MAR Hold] nitroGLYCERIN, sodium chloride flush, [MAR Hold] traMADol  ? ?Vital Signs  ?  ?Vitals:  ? 06/13/21 1600 06/13/21 1615 06/13/21 1630 06/13/21 1645  ?BP: (!) 94/56  (!) 109/48   ?Pulse: 82 86 82 88  ?Resp: (!) 25 19 18 18   ?Temp:      ?TempSrc:      ?SpO2: 96% 95% 95% 96%  ?Weight:      ? ? ?Intake/Output Summary (Last 24 hours) at 06/13/2021 1703 ?Last data filed at 06/13/2021 1127 ?Gross per 24 hour  ?Intake 320 ml  ?Output 1050 ml  ?Net -730 ml  ? ? ?  06/05/2021  ?  4:52 PM 05/08/2021  ?  2:22 PM 03/01/2021  ? 11:23 AM  ?Last 3 Weights  ?Weight (lbs) 150 lb 147 lb 2 oz 142 lb  ?Weight (kg) 68.04 kg 66.735 kg  64.411 kg  ?   ? ?Telemetry  ?  ?NSR - Personally Reviewed ? ?ECG  ?  ?NSR with LBBB - Personally Reviewed ? ?Physical Exam  ? ?GEN: No acute distress.   ?Neck: No JVD ?Cardiac: RRR, no murmurs, rubs, or gallops.  Right radial arteriotomy with TR band in place.  No hematoma. ?Respiratory: Clear to auscultation bilaterally. ?GI: Soft, nontender, non-distended  ?MS: Trace-1+ pretibial edema bilaterally. ?Neuro:  Nonfocal  ?Psych: Normal affect  ? ?Labs  ?  ?High Sensitivity Troponin:   ?Recent Labs  ?Lab 06/06/21 ?8115 06/06/21 ?0350 06/06/21 ?1132 06/06/21 ?1321 06/08/21 ?2156  ?TROPONINIHS 926* [id number]* 1,935* 1,888* 1,177*  ?   ?Chemistry ?Recent Labs  ?Lab 06/08/21 ?2156 06/08/21 ?2158 06/09/21 ?7262 06/12/21 ?0428 06/13/21 ?0355  ?NA 134*  --  135 139 138  ?K 3.8  --  4.0 3.1* 4.3  ?CL 105  --  106 100 100  ?CO2 20*  --  19* 29 29  ?GLUCOSE 163*  --  110* 120* 101*  ?BUN 32*  --  31* 29* 22  ?CREATININE 1.50*  --  1.38* 0.93 0.86  ?CALCIUM 6.8*  --  7.5* 8.6* 9.1  ?MG  --  2.4  --   --  2.2  ?PROT 5.7*  --  6.4* 6.4*  --   ?ALBUMIN 2.8*  --  3.4* 3.2*  --   ?AST 137*  --  182* 39  --   ?ALT 78*  --  92* 39  --   ?ALKPHOS 49  --  44 69  --   ?BILITOT 0.6  --  0.7 1.3*  --   ?GFRNONAA 35*  --  39* >60 >60  ?ANIONGAP 9  --  10 10 9   ?  ?Lipids No results for input(s): CHOL, TRIG, HDL, LABVLDL, LDLCALC, CHOLHDL in the last 168 hours.  ?Hematology ?Recent Labs  ?Lab 06/09/21 ?8270 06/12/21 ?0428 06/13/21 ?7867  ?WBC 9.8 8.5 8.7  ?RBC 2.86* 3.50* 3.32*  ?HGB 9.1* 11.1* 10.6*  ?HCT 28.2* 33.9* 32.8*  ?MCV 98.6 96.9 98.8  ?MCH 31.8 31.7 31.9  ?MCHC 32.3 32.7 32.3  ?RDW 13.8 14.1 14.7  ?PLT 144* 280 287  ? ?Thyroid No results for input(s): TSH, FREET4 in the last 168 hours.  ?BNPNo results for input(s): BNP, PROBNP in the last 168 hours.  ?DDimer No results for input(s): DDIMER in the last 168 hours.  ? ?Radiology  ?  ?CARDIAC CATHETERIZATION ? ?Result Date: 06/13/2021 ?Conclusions: Severe single-vessel coronary artery  disease with sequential 80-90% proximal/mid LAD stenoses flanking previously placed, which are highly significant by iFR and involve D1 and D2. Mild-moderate, nonobstructive CAD involving ramus intermedius and RCA. Widely patent mid LAD and mid RCA stents. Mildly elevated left and right heart filling pressures. Mild-moderate pulmonary hypertension. Mildly-moderately reduced Fick cardiac output/index. Successful PCI to proximal LAD using Onyx frontier 3.0 x 12 mm drug-eluting stent (jailing D1) with 0% residual stenosis and TIMI-3 flow. Successful PCI to mid LAD using Onyx frontier 2.5 x 15 mm drug-eluting stent (jailing D2) with angioplasty of ostium of D2.  There is 0% residual stenosis in the LAD and 80% residual stenosis at the ostium of D2 with TIMI-3 flow. Recommendations: Dual antiplatelet therapy with aspirin and clopidogrel for at least 12 months. Aggressive secondary prevention of coronary artery disease. Continued gentle diuresis. Escalate goal-directed medical therapy for acute on chronic HFrEF. Nelva Bush, MD [facility]  ? ?Cardiac Studies  ? ?See cath above. ? ?TTE (06/06/2021): ? 1. Left ventricular ejection fraction, by estimation, is 25 to 30%. The  ?left ventricle has severely decreased function. The left ventricle  ?demonstrates global hypokinesis. The left ventricular internal cavity size  ?was mildly dilated. There is moderate  ?asymmetric left ventricular hypertrophy of the septal segment. Left  ?ventricular diastolic parameters are consistent with Grade II diastolic  ?dysfunction (pseudonormalization).  ? 2. Right ventricular systolic function is low normal. The right  ?ventricular size is mildly enlarged.  ? 3. Left atrial size was moderately dilated.  ? 4. The mitral valve is normal in structure. Moderate mitral valve  ?regurgitation.  ? 5. The aortic valve is tricuspid. Aortic valve regurgitation is trivial.  ? ?Patient Profile  ?   ?81 y.o. female  CAD s/p PCI to the mid LAD and mid  RCA in 01/2015, HFrEF secondary to ICM, mitral regurgitation, LBBB, HTN, HLD, hepatitis C, CKD stage III, chronic back pain s/p multiple surgeries, and chronic UTIs, admitted with mechanical fall and left hip fracture complicated by NSTEMI and acute on chronic HFrEF. ? ?Assessment & Plan  ?  ?NSTEMI: ?No chest pain reported.  Catheterization today showed severe proximal and mid LAD stenoses flanking previously placed stent.  This was successfully treated with 2 new stents that both overlapped the old mid LAD stent. ?-Continue aspirin and clopidogrel for at least 12 months. ?-Restart rosuvastatin 40 mg daily. ? ?Acute on chronic HFrEF: ?Volume status has continued to improve.  Catheterization today showed only mildly elevated left and right heart filling pressures with mildly-moderately reduced Fick cardiac output.  Entresto currently on hold secondary to hypotension ?-Reduce metoprolol succinate to 12.5 mg daily in the setting of soft blood pressure and decreased cardiac output. ?-Continue furosemide 40 mg IV twice daily.  Hopefully, we can transition to oral diuretic therapy tomorrow. ?-Consider restarting low-dose ARB as blood pressure allows with transition to [facility] as an outpatient. ?-Continue midodrine for blood pressure support at this time, though hopefully this can be weaned off. ? ?Hypotension: ?Likely multifactorial including HFrEF and medications. ?-Continue midodrine. ?-Reduce metoprolol succinate to 12.5 mg daily. ? ?Acute kidney injury superimposed on chronic kidney disease: ?Creatinine back at baseline.  We will need to monitor renal function closely in the setting of continued diuresis and IV contrast administration for today's catheterization. ?-Follow-up BMP tomorrow. ? ?Disposition: ?If patient does not have any postcatheterization complications and renal function remains stable overnight, discharge as soon as tomorrow could be considered. ? ?For questions or updates, please contact [place] ?Please consult [email or web address] for contact info under [facility].  ?   ?Signed, ?Nelva Bush, MD  ?06/13/2021, 5:03 PM   ? ?

## 2021-06-13 NOTE — TOC Progression Note (Signed)
Transition of Care (TOC) - Progression Note  ? ? ?Patient Details  ?Name: [name] ?MRN: [id number] ?Date of Birth: 12-18-1940 ? ?Transition of Care (TOC) CM/SW Contact  ?Conception Oms, RN ?Phone Number: ?06/13/2021, 1:33 PM ? ?Clinical Narrative:    ? ? ?Insurance [id number] approved to go to [place] 4/12-4/15 ?  ?  ? ?Expected Discharge Plan and Services ?  ?  ?  ?  ?  ?                ?  ?  ?  ?  ?  ?  ?  ?  ?  ?  ? ? ?Social Determinants of Health (SDOH) Interventions ?  ? ?Readmission Risk Interventions ?   ? View : No data to display.  ?  ?  ?  ? ? ?

## 2021-06-13 NOTE — Telephone Encounter (Signed)
Pt called in... Advised pt of notes below... Pt is wondering how long can she go without having her prolia injection before she have to start all over.... Pt was wondering if she gets out the hospital anytime soon, would someone be able to come to pt car to give her the injection... Pt requesting callback  ?

## 2021-06-14 ENCOUNTER — Encounter: Payer: Self-pay | Admitting: Internal Medicine

## 2021-06-14 DIAGNOSIS — W19XXXS Unspecified fall, sequela: Secondary | ICD-10-CM

## 2021-06-14 DIAGNOSIS — J9601 Acute respiratory failure with hypoxia: Secondary | ICD-10-CM

## 2021-06-14 DIAGNOSIS — I5023 Acute on chronic systolic (congestive) heart failure: Secondary | ICD-10-CM | POA: Diagnosis not present

## 2021-06-14 DIAGNOSIS — M25552 Pain in left hip: Secondary | ICD-10-CM

## 2021-06-14 DIAGNOSIS — K5900 Constipation, unspecified: Secondary | ICD-10-CM | POA: Diagnosis not present

## 2021-06-14 DIAGNOSIS — S72002A Fracture of unspecified part of neck of left femur, initial encounter for closed fracture: Secondary | ICD-10-CM | POA: Diagnosis not present

## 2021-06-14 DIAGNOSIS — Z955 Presence of coronary angioplasty implant and graft: Secondary | ICD-10-CM

## 2021-06-14 DIAGNOSIS — E782 Mixed hyperlipidemia: Secondary | ICD-10-CM | POA: Diagnosis not present

## 2021-06-14 LAB — COMPREHENSIVE METABOLIC PANEL
ALT: 27 U/L (ref 0–44)
AST: 34 U/L (ref 15–41)
Albumin: 3.5 g/dL (ref 3.5–5.0)
Alkaline Phosphatase: 67 U/L (ref 38–126)
Anion gap: 10 (ref 5–15)
BUN: 18 mg/dL (ref 8–23)
CO2: 28 mmol/L (ref 22–32)
Calcium: 8.9 mg/dL (ref 8.9–10.3)
Chloride: 98 mmol/L (ref 98–111)
Creatinine, Ser: 0.97 mg/dL (ref 0.44–1.00)
GFR, Estimated: 59 mL/min — ABNORMAL LOW (ref >60)
Glucose, Bld: 125 mg/dL — ABNORMAL HIGH (ref 70–99)
Potassium: 3.7 mmol/L (ref 3.5–5.1)
Sodium: 136 mmol/L (ref 135–145)
Total Bilirubin: 1.3 mg/dL — ABNORMAL HIGH (ref 0.3–1.2)
Total Protein: 6.9 g/dL (ref 6.5–8.1)

## 2021-06-14 LAB — CBC WITH DIFFERENTIAL/PLATELET
Abs Immature Granulocytes: 0.06 10*3/uL (ref 0.00–0.07)
Basophils Absolute: 0 10*3/uL (ref 0.0–0.1)
Basophils Relative: 0 %
Eosinophils Absolute: 0.1 10*3/uL (ref 0.0–0.5)
Eosinophils Relative: 2 %
HCT: 32.1 % — ABNORMAL LOW (ref 36.0–46.0)
Hemoglobin: 10.5 g/dL — ABNORMAL LOW (ref 12.0–15.0)
Immature Granulocytes: 1 %
Lymphocytes Relative: 13 %
Lymphs Abs: 1.1 10*3/uL (ref 0.7–4.0)
MCH: 32.2 pg (ref 26.0–34.0)
MCHC: 32.7 g/dL (ref 30.0–36.0)
MCV: 98.5 fL (ref 80.0–100.0)
Monocytes Absolute: 0.6 10*3/uL (ref 0.1–1.0)
Monocytes Relative: 7 %
Neutro Abs: 6.6 10*3/uL (ref 1.7–7.7)
Neutrophils Relative %: 77 %
Platelets: 304 10*3/uL (ref 150–400)
RBC: 3.26 MIL/uL — ABNORMAL LOW (ref 3.87–5.11)
RDW: 16 % — ABNORMAL HIGH (ref 11.5–15.5)
WBC: 8.5 10*3/uL (ref 4.0–10.5)
nRBC: 0 % (ref 0.0–0.2)

## 2021-06-14 LAB — MAGNESIUM: Magnesium: 2.3 mg/dL (ref 1.7–2.4)

## 2021-06-14 MED ORDER — ROSUVASTATIN CALCIUM 10 MG PO TABS
40.0000 mg | ORAL_TABLET | Freq: Every day | ORAL | Status: DC
  Administered 2021-06-14 – 2021-06-15 (×2): 40 mg via ORAL
  Filled 2021-06-14 (×2): qty 4

## 2021-06-14 NOTE — Care Management Important Message (Signed)
Important Message ? ?Patient Details  ?Name: [name] ?MRN: [id number] ?Date of Birth: 1940/08/11 ? ? ?Medicare Important Message Given:  Yes ? ? ? ? ?Dannette Barbara ?06/14/2021, 2:16 PM ?

## 2021-06-14 NOTE — Progress Notes (Signed)
?Progress Note ? ? ? ?[name]   ?HEN:[id number]  ?DOB: [birth year]  ?DOA: 06/05/2021     9 ?PCP: McLean-Scocuzza, Nino Glow, MD ? ?Initial CC: fall at home ? ?Hospital Course: ?Ms. Scarpati is a 81 yo female with PMH CAD, chronic fatigue, chronic sCHF, mitral regurgitation, osteoporosis, anxiety, arthritis, chronic back pain who presented after a mechanical fall resulting in a left femoral neck fracture.  She underwent left hip hemiarthroplasty on 06/07/2021 with orthopedic surgery.  She had uptrending troponins and was also evaluated by cardiology.  She was started on diuresis due to overhydration in the postop period.  Blood pressure medications were also modified due to some hypotension requiring use of midodrine.  She was also recommended to undergo heart catheterization for further evaluation during this hospitalization given her NSTEMI. She underwent LHC on 4/12 and received 2 DES.  ? ?Interval History:  ?No events overnight.  Husband present bedside this morning.  She is feeling relatively well.  Denies any chest pain or shortness of breath.  Reviewed Cath Lab findings which she remembered and understands.  All questions answered.  They have agreed on [facility].  Possibly discharging there tomorrow if cleared by cardiology versus possibly Saturday then if [facility] able to accept on Saturdays. ? ?Assessment and Plan: ?* Closed left hip fracture (HCC) ?- s/p mechanical fall ?- underwent left hip hemiarthroplasty on 06/07/2021 ?- Okay for use of antiplatelet therapy and anticoagulation therapy per orthopedic surgery in setting of potential need for PCI ?-Continue working with physical therapy.  Ultimately she will discharge to SNF once stable; possibly Fri or Sat to [facility] ? ?Non-ST elevation (NSTEMI) myocardial infarction [facility]) ?- was initially started on IV heparin with uptrending trops ?- s/p LHC on 4/12; received 2 DES (prox and mid LAD) ?- continue plavix, toprol ?- statin seems to have been held previously due  to elevated LFTs ?- LFTs now improved and normalized; statin has been resumed  ? ?Acute on chronic HFrEF (heart failure with reduced ejection fraction) ([place]) ?- Entresto discontinued due to soft blood pressures ?-Cardiology following ?- Continue Lasix as blood pressure able to tolerate ?- Continue Toprol ?-Echo this hospitalization shows EF 25 to 30%, global hypokinesis, grade 2 diastolic dysfunction, mildly enlarged RV; LVH ? ?Essential hypertension ?- soft BP, requiring midodrine ?- currently on lasix, Toprol ?- ARB on hold until further BP improvement ? ?Constipation ?- Patient complaining of constipation with no bowel movement recently ?- Takes magnesium at home with good results she says ?- Bowel regimen modified for now ? ?Hypokalemia ?- Replete as needed ? ?Hyperlipidemia ?- Statin on hold until LFTs remain stable after normalizing ? ?Acute metabolic encephalopathy-resolved as of 06/13/2021 ?- patient symptoms include lethargy ?- etiology considered due to metabolic effect from pain meds noted on 4/7 ?- mentation has improved and remained stable ? ? ?AKI (acute kidney injury) (HCC)-resolved as of 06/13/2021 ?- baseline creatinine ~ 1 ?- patient presents with increase in creat >0.3 mg/dL above baseline, creat increase >1.5x baseline presumed to have occurred within past 7 days PTA ?- has improved with diuresis given volume overload/CRS ? ? ?Elevated troponin-resolved as of 06/14/2021 ?- See NSTEMI ? ? ? ?Old records reviewed in assessment of this patient ? ?Antimicrobials: ? ? ?DVT prophylaxis:  ?enoxaparin (LOVENOX) injection 40 mg Start: 06/14/21 0800 ?SCDs Start: 06/07/21 1423 ? ? ?Code Status:   Code Status: Full Code ? ?Disposition Plan: [facility] Friday, if not Saturday if able to accept patient  ?  Status is: Inpatient ? ?Objective: ?Blood pressure (!) 123/52, pulse 83, temperature 97.7 ?F (36.5 ?C), resp. rate 20, weight 68 kg, SpO2 95 %.  ?Examination:  ?Physical Exam ?Constitutional:   ?   Appearance:  Normal appearance.  ?HENT:  ?   Head: Normocephalic and atraumatic.  ?   Mouth/Throat:  ?   Mouth: Mucous membranes are moist.  ?Eyes:  ?   Extraocular Movements: Extraocular movements intact.  ?Cardiovascular:  ?   Rate and Rhythm: Normal rate and regular rhythm.  ?Abdominal:  ?   General: Bowel sounds are normal. There is no distension.  ?   Palpations: Abdomen is soft.  ?   Tenderness: There is no abdominal tenderness.  ?Musculoskeletal:     ?   General: Normal range of motion.  ?   Cervical back: Normal range of motion and neck supple.  ?   Comments: Left hip surgical dressings in place.  Compartments soft but swollen.  Tender but appropriate  ?Skin: ?   General: Skin is warm and dry.  ?Neurological:  ?   General: No focal deficit present.  ?   Mental Status: She is alert and oriented to person, place, and time.  ?Psychiatric:     ?   Mood and Affect: Mood normal.     ?   Behavior: Behavior normal.  ?  ? ?Consultants:  ?Orthopedic surgery ?Cardiology ? ?Procedures:  ?Left hip hemiarthroplasty, 06/07/2021 ?Left heart cath, 06/13/2021 ? ?Data Reviewed: ?Results for orders placed or performed during the hospital encounter of 06/05/21 (from the past 24 hour(s))  ?CBC with Differential/Platelet     Status: Abnormal  ? Collection Time: 06/14/21  6:23 AM  ?Result Value Ref Range  ? WBC 8.5 4.0 - 10.5 K/uL  ? RBC 3.26 (L) 3.87 - 5.11 MIL/uL  ? Hemoglobin 10.5 (L) 12.0 - 15.0 g/dL  ? HCT 32.1 (L) 36.0 - 46.0 %  ? MCV 98.5 80.0 - 100.0 fL  ? MCH 32.2 26.0 - 34.0 pg  ? MCHC 32.7 30.0 - 36.0 g/dL  ? RDW 16.0 (H) 11.5 - 15.5 %  ? Platelets 304 150 - 400 K/uL  ? nRBC 0.0 0.0 - 0.2 %  ? Neutrophils Relative % 77 %  ? Neutro Abs 6.6 1.7 - 7.7 K/uL  ? Lymphocytes Relative 13 %  ? Lymphs Abs 1.1 0.7 - 4.0 K/uL  ? Monocytes Relative 7 %  ? Monocytes Absolute 0.6 0.1 - 1.0 K/uL  ? Eosinophils Relative 2 %  ? Eosinophils Absolute 0.1 0.0 - 0.5 K/uL  ? Basophils Relative 0 %  ? Basophils Absolute 0.0 0.0 - 0.1 K/uL  ? Immature  Granulocytes 1 %  ? Abs Immature Granulocytes 0.06 0.00 - 0.07 K/uL  ?Magnesium     Status: None  ? Collection Time: 06/14/21  6:23 AM  ?Result Value Ref Range  ? Magnesium 2.3 1.7 - 2.4 mg/dL  ?Comprehensive metabolic panel     Status: Abnormal  ? Collection Time: 06/14/21  6:23 AM  ?Result Value Ref Range  ? Sodium 136 135 - 145 mmol/L  ? Potassium 3.7 3.5 - 5.1 mmol/L  ? Chloride 98 98 - 111 mmol/L  ? CO2 28 22 - 32 mmol/L  ? Glucose, Bld 125 (H) 70 - 99 mg/dL  ? BUN 18 8 - 23 mg/dL  ? Creatinine, Ser 0.97 0.44 - 1.00 mg/dL  ? Calcium 8.9 8.9 - 10.3 mg/dL  ? Total Protein 6.9 6.5 - 8.1 g/dL  ?  Albumin 3.5 3.5 - 5.0 g/dL  ? AST 34 15 - 41 U/L  ? ALT 27 0 - 44 U/L  ? Alkaline Phosphatase 67 38 - 126 U/L  ? Total Bilirubin 1.3 (H) 0.3 - 1.2 mg/dL  ? GFR, Estimated 59 (L) >60 mL/min  ? Anion gap 10 5 - 15  ?  ?I have Reviewed nursing notes, Vitals, and Lab results since pt's last encounter. Pertinent lab results : see above ?I have ordered test including BMP, CBC, Mg ?I have reviewed the last note from staff over past 24 hours ?I have discussed pt's care plan and test results with nursing staff, case manager ? ? LOS: 9 days  ? ?Dwyane Dee, MD ?[facility] ?06/14/2021, 4:21 PM ? ?

## 2021-06-14 NOTE — Progress Notes (Signed)
Occupational Therapy Treatment ?Patient Details ?Name: [name] ?MRN: [id number] ?DOB: 07/24/1940 ?Today's Date: 06/14/2021 ? ? ?History of present illness Pt is a 81 y.o. female presenting to hospital 4/4 s/p fall (lost balance walking without walker use).  Imaging showing acute comminuted displaced impacted L femoral neck fx; age-indeterminate fx of the R posterolateral 4th rib with mild displacement; multiple remote appearing thoracic compression fx's (new at T5 and T6 since prior imaging).  Pt admitted with closed L hip fx and NSTEMI.  S/p L hip hemiarthroplasty 4/6.  PMH includes CAD, anxiety, OA, ischemic cariomyopathy, L BBB, moderate-severe mitral regurgitation, UTI, CHF, Hepatitis C, chronic back pain, chronic fatigue, back sx. ?  ?OT comments ? Pt seen for OT tx. Pt agreeable with encouragement to attempt [facility] transfer. Pt required MOD A for anterior weight shift in recliner in preparation for standing, VC for hand/foot placement prior to standing, and MOD-MAX  A to stand. PT noted to shift weight more to RLE and tolerates standing for ~9min before endorsing need to sit and feeling nauseated. Pt educated in visualization strategies and ways to prepare and minimize anxiety before mobility tasks. Pt reported 4/10 max pain in L hip with movement. Pt progressing slowly, continues to benefit from skilled OT services.   ? ?Recommendations for follow up therapy are one component of a multi-disciplinary discharge planning process, led by the attending physician.  Recommendations may be updated based on patient status, additional functional criteria and insurance authorization. ?   ?Follow Up Recommendations ? Skilled nursing-short term rehab (<3 hours/day)  ?  ?Assistance Recommended at Discharge Frequent or constant Supervision/Assistance  ?Patient can return home with the following ? A lot of help with bathing/dressing/bathroom;Assistance with cooking/housework;Assist for transportation;A lot of help with  walking and/or transfers;Help with stairs or ramp for entrance ?  ?Equipment Recommendations ? BSC/3in1  ?  ?Recommendations for Other Services   ? ?  ?Precautions / Restrictions Precautions ?Precautions: Posterior Hip;Fall ?Precaution Booklet Issued: Yes (comment) ?Precaution Comments: comp fractures ?Restrictions ?Weight Bearing Restrictions: Yes ?LLE Weight Bearing: Weight bearing as tolerated  ? ? ?  ? ?Mobility Bed Mobility ?  ?  ?  ?  ?  ?  ?  ?General bed mobility comments: NT, up in recliner ?  ? ?Transfers ?Overall transfer level: Needs assistance ?Equipment used: Rolling walker (2 wheels) ?Transfers: Sit to/from Stand ?Sit to Stand: Max assist, Mod assist ?  ?  ?  ?  ?  ?General transfer comment: VC for hand placement, foot placement, an posture; encouragement to keep standing ?  ?  ?Balance Overall balance assessment: Needs assistance ?Sitting-balance support: Bilateral upper extremity supported, Feet supported ?Sitting balance-Leahy Scale: Fair ?  ?  ?Standing balance support: Bilateral upper extremity supported, During functional activity, Reliant on assistive device for balance ?Standing balance-Leahy Scale: Poor ?Standing balance comment: requires MIN A for standing static ?  ?  ?  ?  ?  ?  ?  ?  ?  ?  ?  ?   ? ?ADL either performed or assessed with clinical judgement  ? ?ADL Overall ADL's : Needs assistance/impaired ?  ?  ?  ?  ?  ?  ?  ?  ?  ?  ?  ?  ?  ?Toilet Transfer Details (indicate cue type and reason): attempted, but ultimately unable to stand pivot to [facility] ?  ?  ?  ?  ?  ?  ?  ? ?Extremity/Trunk Assessment   ?  ?  ?  ?  ?  ? ?  Vision   ?  ?  ?Perception   ?  ?Praxis   ?  ? ?Cognition Arousal/Alertness: Awake/alert ?Behavior During Therapy: [facility] for tasks assessed/performed ?Overall Cognitive Status: Within Functional Limits for tasks assessed ?  ?  ?  ?  ?  ?  ?  ?  ?  ?  ?  ?  ?  ?  ?  ?  ?  ?  ?  ?   ?Exercises   ? ?  ?Shoulder Instructions   ? ? ?  ?General Comments no significant shortness  of breath with activity, however patient is fatigued with mobility efforts  ? ? ?Pertinent Vitals/ Pain       Pain Assessment ?Pain Assessment: 0-10 ?Pain Score: 4  ?Pain Location: left hip, chronic back pain ?Pain Descriptors / Indicators: Discomfort ?Pain Intervention(s): Limited activity within patient's tolerance, Monitored during session, Premedicated before session, Repositioned ? ?Home Living   ?  ?  ?  ?  ?  ?  ?  ?  ?  ?  ?  ?  ?  ?  ?  ?  ?  ?  ? ?  ?Prior Functioning/Environment    ?  ?  ?  ?   ? ?Frequency ? Min 2X/week  ? ? ? ? ?  ?Progress Toward Goals ? ?OT Goals(current goals can now be found in the care plan section) ? Progress towards OT goals: Progressing toward goals ? ?Acute Rehab OT Goals ?Patient Stated Goal: walk again, be independent ?OT Goal Formulation: With patient/family ?Time For Goal Achievement: 06/23/21 ?Potential to Achieve Goals: Good  ?Plan Discharge plan remains appropriate;Frequency remains appropriate   ? ?Co-evaluation ? ? ?   ?  ?  ?  ?  ? ?  ?AM-PAC OT "6 Clicks" Daily Activity     ?Outcome Measure ? ? Help from another person eating meals?: None ?Help from another person taking care of personal grooming?: A Little ?Help from another person toileting, which includes using toliet, bedpan, or urinal?: A Lot ?Help from another person bathing (including washing, rinsing, drying)?: Total ?Help from another person to put on and taking off regular upper body clothing?: A Little ?Help from another person to put on and taking off regular lower body clothing?: Total ?6 Click Score: 14 ? ?  ?End of Session Equipment Utilized During Treatment: Rolling walker (2 wheels);Gait belt ? ?OT Visit Diagnosis: Muscle weakness (generalized) (M62.81);Unsteadiness on feet (R26.81);Pain ?Pain - Right/Left: Left ?Pain - part of body: Hip ?  ?Activity Tolerance Patient limited by fatigue ?  ?Patient Left in chair;with call bell/phone within reach;with family/visitor present ?  ?Nurse Communication   ?   ? ?   ? ?Time: 7939-0300 ?OT Time Calculation (min): 14 min ? ?Charges: OT General Charges ?$OT Visit: 1 Visit ?OT Treatments ?$Therapeutic Activity: 8-22 mins ? ?Ardeth Perfect., MPH, MS, OTR/L ?ascom [phone] ?06/14/21, 1:02 PM ? ?

## 2021-06-14 NOTE — Progress Notes (Signed)
Physical Therapy Treatment ?Patient Details ?Name: [name] ?MRN: [id number] ?DOB: 23-Oct-1940 ?Today's Date: 06/14/2021 ? ? ?History of Present Illness Pt is a 81 y.o. female presenting to hospital 4/4 s/p fall (lost balance walking without walker use).  Imaging showing acute comminuted displaced impacted L femoral neck fx; age-indeterminate fx of the R posterolateral 4th rib with mild displacement; multiple remote appearing thoracic compression fx's (new at T5 and T6 since prior imaging).  Pt admitted with closed L hip fx and NSTEMI.  S/p L hip hemiarthroplasty 4/6.  PMH includes CAD, anxiety, OA, ischemic cariomyopathy, L BBB, moderate-severe mitral regurgitation, UTI, CHF, Hepatitis C, chronic back pain, chronic fatigue, back sx. ? ?  ?PT Comments  ? ? Patient is agreeable to PT. Supportive spouse at the bedside. Pain seems better controlled today and patient reports taking Tylenol before session. Patient continues to require assistance with functional mobility. Two standing bouts performed with maximal assistance of one person. Patient is able to advance LLE with facilitation for weight shifting for stand step from bed to chair, however has difficulty with foot clearance of RLE despite assistance. Standing tolerance limited by fatigue and unable to progress ambulation at this time. Recommend to continue PT to maximize independence and facilitate return to prior level of function. SNF recommended at discharge.  ?  ?Recommendations for follow up therapy are one component of a multi-disciplinary discharge planning process, led by the attending physician.  Recommendations may be updated based on patient status, additional functional criteria and insurance authorization. ? ?Follow Up Recommendations ? Skilled nursing-short term rehab (<3 hours/day) ?  ?  ?Assistance Recommended at Discharge Frequent or constant Supervision/Assistance  ?Patient can return home with the following A lot of help with walking and/or  transfers;A lot of help with bathing/dressing/bathroom;Assistance with cooking/housework;Assistance with feeding;Direct supervision/assist for medications management;Direct supervision/assist for financial management;Assist for transportation;Help with stairs or ramp for entrance ?  ?Equipment Recommendations ? Rolling walker (2 wheels);BSC/3in1;Wheelchair (measurements PT);Wheelchair cushion (measurements PT);Hospital bed;Other (comment)  ?  ?Recommendations for Other Services   ? ? ?  ?Precautions / Restrictions Precautions ?Precautions: Posterior Hip;Fall ?Restrictions ?Weight Bearing Restrictions: Yes ?LLE Weight Bearing: Weight bearing as tolerated  ?  ? ?Mobility ? Bed Mobility ?Overal bed mobility: Needs Assistance ?Bed Mobility: Sit to Supine ?  ?  ?  ?Sit to supine: Max assist ?  ?General bed mobility comments: assistance for LE and trunk support. verbal cues for technique. increased time and effort required with functional mobility ?  ? ?Transfers ?Overall transfer level: Needs assistance ?Equipment used: Rolling walker (2 wheels) ?Transfers: Bed to chair/wheelchair/BSC ?Sit to Stand: Max assist ?  ?Step pivot transfers: Max assist ?  ?  ?  ?General transfer comment: faciliation for lifting and lowering provided with transfers. verbal cues for technique. facilition for weight shifting to right for advancement of LLE with cues provided to increase step length. patient has difficulty with weight shifting to left for foot clearance of RLE. she is fatigued with activity. total of 2 standng bouts performed with seated rest breakd in beween bouts of activity ?  ? ?Ambulation/Gait ?  ?  ?  ?  ?  ?  ?  ?General Gait Details: limited standing tolerance for progression of ambulation ? ? ?Stairs ?  ?  ?  ?  ?  ? ? ?Wheelchair Mobility ?  ? ?Modified Rankin (Stroke Patients Only) ?  ? ? ?  ?Balance   ?  ?  ?  ?  ?  ?  ?  ?  ?  ?  ?  ?  ?  ?  ?  ?  ?  ?  ?  ? ?  ?  Cognition Arousal/Alertness: Awake/alert ?Behavior  During Therapy: [facility] for tasks assessed/performed ?Overall Cognitive Status: Within Functional Limits for tasks assessed ?  ?  ?  ?  ?  ?  ?  ?  ?  ?  ?  ?  ?  ?  ?  ?  ?General Comments: patient is able to follow all commands with extra time ?  ?  ? ?  ?Exercises   ? ?  ?General Comments General comments (skin integrity, edema, etc.): no significant shortness of breath with activity, however patient is fatigued with mobility efforts ?  ?  ? ?Pertinent Vitals/Pain Pain Assessment ?Pain Assessment: Faces ?Faces Pain Scale: Hurts little more ?Pain Location: left hip, chronic back pain ?Pain Descriptors / Indicators: Discomfort ?Pain Intervention(s): Limited activity within patient's tolerance, Premedicated before session (offered ice pack, patient declined)  ? ? ?Home Living   ?  ?  ?  ?  ?  ?  ?  ?  ?  ?   ?  ?Prior Function    ?  ?  ?   ? ?PT Goals (current goals can now be found in the care plan section) Acute Rehab PT Goals ?Patient Stated Goal: get better and go home ?PT Goal Formulation: With patient ?Time For Goal Achievement: 06/22/21 ?Potential to Achieve Goals: Fair ?Progress towards PT goals: Progressing toward goals ? ?  ?Frequency ? ? ? BID ? ? ? ?  ?PT Plan Current plan remains appropriate  ? ? ?Co-evaluation   ?  ?  ?  ?  ? ?  ?AM-PAC PT "6 Clicks" Mobility   ?Outcome Measure ? Help needed turning from your back to your side while in a flat bed without using bedrails?: A Lot ?Help needed moving from lying on your back to sitting on the side of a flat bed without using bedrails?: A Lot ?Help needed moving to and from a bed to a chair (including a wheelchair)?: A Lot ?Help needed standing up from a chair using your arms (e.g., wheelchair or bedside chair)?: A Lot ?Help needed to walk in hospital room?: Total ?Help needed climbing 3-5 steps with a railing? : Total ?6 Click Score: 10 ? ?  ?End of Session Equipment Utilized During Treatment: Gait belt ?Activity Tolerance: Patient tolerated treatment  well ?Patient left: in chair;with call bell/phone within reach;with family/visitor present (spouse present) ?Nurse Communication: Mobility status (discussed with nurse aide) ?PT Visit Diagnosis: Muscle weakness (generalized) (M62.81);Difficulty in walking, not elsewhere classified (R26.2);Other abnormalities of gait and mobility (R26.89);Pain;Unsteadiness on feet (R26.81) ?Pain - Right/Left: Left ?Pain - part of body: Hip ?  ? ? ?Time: 2800-3491 ?PT Time Calculation (min) (ACUTE ONLY): 33 min ? ?Charges:  $Therapeutic Activity: 23-37 mins          ?          ? ?Minna Merritts, PT, MPT ? ? ? ?Percell Locus ?06/14/2021, 10:51 AM ? ?

## 2021-06-14 NOTE — Progress Notes (Signed)
? ?Cardiology Progress Note  ? ?Patient Name: [name] ?Date of Encounter: 06/14/2021 ? ?Primary Cardiologist: Ida Rogue, MD ? ?Subjective  ? ?No chest pain.  Tires easily w/ transferring from bed to chair. ? ?Inpatient Medications  ?  ?Scheduled Meds: ? aspirin  81 mg Oral Daily  ? calcium-vitamin D  1 tablet Oral Q breakfast  ? Chlorhexidine Gluconate Cloth  6 each Topical Q0600  ? citalopram  10 mg Oral Daily  ? clopidogrel  75 mg Oral Daily  ? enoxaparin (LOVENOX) injection  40 mg Subcutaneous Q24H  ? furosemide  40 mg Intravenous Q12H  ? lactulose  20 g Oral Daily  ? metoprolol succinate  12.5 mg Oral Daily  ? midodrine  5 mg Oral TID WC  ? multivitamin with minerals  1 tablet Oral Daily  ? polyethylene glycol  17 g Oral Daily  ? senna-docusate  2 tablet Oral BID  ? sodium chloride flush  3 mL Intravenous Q12H  ? ?Continuous Infusions: ? sodium chloride    ? ?PRN Meds: ?sodium chloride, acetaminophen, ALPRAZolam, bisacodyl, ipratropium, magnesium hydroxide, menthol-cetylpyridinium **OR** phenol, nitroGLYCERIN, sodium chloride flush, traMADol  ? ?Vital Signs  ?  ?Vitals:  ? 06/13/21 2016 06/14/21 0027 06/14/21 0419 06/14/21 0811  ?BP: (!) 118/32 (!) 111/52 (!) 115/44 95/70  ?Pulse: 87 90 87 95  ?Resp: 20 16 20 16   ?Temp: 98 ?F (36.7 ?C) 97.6 ?F (36.4 ?C) 97.8 ?F (36.6 ?C) (!) 97.3 ?F (36.3 ?C)  ?TempSrc: Oral     ?SpO2: 94% 95% 96% 97%  ?Weight:      ? ? ?Intake/Output Summary (Last 24 hours) at 06/14/2021 1046 ?Last data filed at 06/14/2021 1013 ?Gross per 24 hour  ?Intake 240 ml  ?Output 2050 ml  ?Net -1810 ml  ? ?Filed Weights  ? 06/05/21 1652  ?Weight: 68 kg  ? ? ?Physical Exam  ? ?GEN: Well nourished, well developed, in no acute distress.  ?HEENT: Grossly normal.  ?Neck: Supple, no JVD, carotid bruits, or masses. ?Cardiac: RRR, +S4, no rubs.  2/6 syst murmur @ LLSB/apex.  No clubbing, cyanosis, 1+ left lower ext/trace R lower ext edema.  Radials 2+, DP/PT 2+ and equal bilaterally. R radial/brachial  cath sites w/o bleeding/bruits/hematoma. ?Respiratory:  Respirations regular and unlabored, bibasilar crackles. ?GI: Soft, nontender, nondistended, BS + x 4. ?MS: no deformity or atrophy. ?Skin: warm and dry, no rash. ?Neuro:  Strength and sensation are intact. ?Psych: AAOx3.  Normal affect. ? ?Labs  ?  ?Chemistry ?Recent Labs  ?Lab 06/09/21 ?4196 06/12/21 ?0428 06/13/21 ?2229 06/14/21 ?7989  ?NA 135 139 138 136  ?K 4.0 3.1* 4.3 3.7  ?CL 106 100 100 98  ?CO2 19* 29 29 28   ?GLUCOSE 110* 120* 101* 125*  ?BUN 31* 29* 22 18  ?CREATININE 1.38* 0.93 0.86 0.97  ?CALCIUM 7.5* 8.6* 9.1 8.9  ?PROT 6.4* 6.4*  --  6.9  ?ALBUMIN 3.4* 3.2*  --  3.5  ?AST 182* 39  --  34  ?ALT 92* 39  --  27  ?ALKPHOS 44 69  --  67  ?BILITOT 0.7 1.3*  --  1.3*  ?GFRNONAA 39* >60 >60 59*  ?ANIONGAP 10 10 9 10   ?  ? ?Hematology ?Recent Labs  ?Lab 06/12/21 ?0428 06/13/21 ?2119 06/14/21 ?4174  ?WBC 8.5 8.7 8.5  ?RBC 3.50* 3.32* 3.26*  ?HGB 11.1* 10.6* 10.5*  ?HCT 33.9* 32.8* 32.1*  ?MCV 96.9 98.8 98.5  ?MCH 31.7 31.9 32.2  ?MCHC 32.7 32.3  32.7  ?RDW 14.1 14.7 16.0*  ?PLT 280 287 304  ? ? ?Cardiac Enzymes  ?Recent Labs  ?Lab 06/06/21 ?6767 06/06/21 ?0350 06/06/21 ?1132 06/06/21 ?1321 06/08/21 ?2156  ?TROPONINIHS 926* [id number]* 1,935* 1,888* 1,177*  ?   ? ?BNP ?   ?Component Value Date/Time  ? BNP 1,590.0 (H) 06/06/2021 0350  ? ? ?ProBNP ?   ?Component Value Date/Time  ? PROBNP 3,183.0 (H) 11/23/2014 1115  ? ?Lipids  ?Lab Results  ?Component Value Date  ? CHOL 176 05/08/2021  ? HDL 48 05/08/2021  ? [place] 81 05/08/2021  ? LDLDIRECT 52.0 01/05/2019  ? TRIG 285 (H) 05/08/2021  ? CHOLHDL 3.7 05/08/2021  ? ? ?HbA1c  ?Lab Results  ?Component Value Date  ? HGBA1C 6.0 06/21/2020  ? ? ?Radiology  ?  ?------------------ ? ?Telemetry  ?  ?RSR, 80's 4 beats NSVT - Personally Reviewed ? ?Cardiac Studies  ? ?2D Echocardiogram 04.05.2023 ? ?1. Left ventricular ejection fraction, by estimation, is 25 to 30%. The left ventricle has severely decreased function. The left  ventricle demonstrates global hypokinesis. The left ventricular internal cavity size was mildly dilated. There is moderate  ?asymmetric left ventricular hypertrophy of the septal segment. Left ventricular diastolic parameters are consistent with Grade II diastolic dysfunction (pseudonormalization).  ? 2. Right ventricular systolic function is low normal. The right  ?ventricular size is mildly enlarged.  ? 3. Left atrial size was moderately dilated.  ? 4. The mitral valve is normal in structure. Moderate mitral valve regurgitation.  ? 5. The aortic valve is tricuspid. Aortic valve regurgitation is trivial.  ?_____________  ? ?Cardiac Catheterization and Percutaneous Coronary Intervention 04.12.2023 ? ?Left Main  ?Vessel is large. Vessel is angiographically normal.  ?Left Anterior Descending  ?Ost LAD lesion is 30% stenosed.  ?Prox LAD lesion is 90% stenosed. The lesion is eccentric. Pressure wire/FFR was performed on the lesion. iFR = 0.63. (3.0x12 Onyx Frontier DES)  ?Previously placed Mid LAD-1 stent (unknown type) is widely patent.  ?Mid LAD-2 lesion is 80% stenosed with 70% stenosed side branch in 2nd Diag. Pressure wire/FFR was performed on the lesion. iFR (LAD) = 0.63. (2.5x15 Onyx Frontier DES)  ?D2 80% (PTCA)  ?Ramus Intermedius  ?Vessel is moderate in size.  ?Ramus lesion is 40% stenosed.  ?Left Circumflex  ?Vessel is large.  ?First Obtuse Marginal Branch  ?Vessel is small in size.  ?Second Obtuse Marginal Branch  ?Vessel is moderate in size.  ?Third Obtuse Marginal Branch  ?Vessel is small in size.  ?Right Coronary Artery  ?Vessel is moderate in size.  ?Previously placed Mid RCA-1 stent (unknown type) is widely patent. Previously placed stent displays no restenosis.  ?Mid RCA-2 lesion is 50% stenosed.  ? ?LVEDP 15-49mmHg ? ?Right Heart Pressures RA (mean): 7 mmHg ?RV (S/EDP): 50/8 mmHg ?PA (S/D, mean): 50/20 (30) mmHg ?PCWP (mean): 22 mmHg ? ?Ao sat: 93% ?PA sat: 48% ? ?Fick CO: 3.3 L/min ?Fick CI: 2.0  L/min/m^2  ?_____________  ? ?Patient Profile  ?   ?81 y.o. female  CAD s/p PCI to the mid LAD and mid RCA in 01/2015, HFrEF secondary to ICM, mitral regurgitation, LBBB, HTN, HLD, hepatitis C, CKD stage III, chronic back pain s/p multiple surgeries, and chronic UTIs, admitted with mechanical fall and left hip fracture complicated by NSTEMI and acute on chronic HFrEF. ? ?Assessment & Plan  ?  ?1.  NSTEMI/CAD:  elevated HsTrop in setting of fall w/ L hip fx and HFrEF.  Cath 4/12 w/  severe prox/mid LAD and D2 dzs, s/p DES x 2 and PTCA of D2.  Prev LAD and RCA stents patent.  RHC w/ mildly elevated filling pressures.  No c/p overnight.  Cont asa, plavix, low-dose ? blocker.  Adding back rosuva 40. ? ?2.  Acute on chronic HFrEF:  EF 25-30%.  [place] 4/12 w/ PCWP of 36mmHg.  Minus 2L yesterday.  Renal fxn stable.  Bibasilar crackles w/ mild lower ext edema (L>R) on exam.  Cont IV lasix today.  Cont low-dose ? blocker.  No acei/arb/arni/mra 2/2 soft BPs req midodrine. ? ?3.  Hypotension:  BPs soft - 90's to 110.  Cont midodrine. ? ?4.  AKI on CKD III:  Creat stable this AM.  Follow w/ ongoing diuresis. ? ?5.  L Hip Fx:  s/p L hip hemiarthroplasty on 4/6.  PT/OT. ? ?6.  HL:  LDL 81.  Resume high potency statin rx. ? ?7.  Normocytic anemia:  stable. ? ?8.  Moderate MR:  stable by echo. ? ?Signed, ?Murray Hodgkins, NP  ?06/14/2021, 10:46 AM   ? ?For questions or updates, please contact   ?Please consult [email or web address] for contact info under Cardiology/STEMI.  ?

## 2021-06-14 NOTE — Progress Notes (Addendum)
Physical Therapy Treatment ?Patient Details ?Name: [name] ?MRN: [id number] ?DOB: 1940/12/15 ?Today's Date: 06/14/2021 ? ? ?History of Present Illness Pt is a 81 y.o. female presenting to hospital 4/4 s/p fall (lost balance walking without walker use).  Imaging showing acute comminuted displaced impacted L femoral neck fx; age-indeterminate fx of the R posterolateral 4th rib with mild displacement; multiple remote appearing thoracic compression fx's (new at T5 and T6 since prior imaging).  Pt admitted with closed L hip fx and NSTEMI.  S/p L hip hemiarthroplasty 4/6.  PMH includes CAD, anxiety, OA, ischemic cariomyopathy, L BBB, moderate-severe mitral regurgitation, UTI, CHF, Hepatitis C, chronic back pain, chronic fatigue, back sx. ? ?  ?PT Comments  ? ? Patient seen for second PT session today. She was agreeable to in bed exercises for strengthening. She declined out of bed mobility due to fatigue and just having got back to bed not long ago. See exercises performed below. PT will continue to follow and SNF remains appropriate discharge recommendation at this time.  ?  ?Recommendations for follow up therapy are one component of a multi-disciplinary discharge planning process, led by the attending physician.  Recommendations may be updated based on patient status, additional functional criteria and insurance authorization. ? ?Follow Up Recommendations ? Skilled nursing-short term rehab (<3 hours/day) ?  ?  ?Assistance Recommended at Discharge Frequent or constant Supervision/Assistance  ?Patient can return home with the following A lot of help with walking and/or transfers;A lot of help with bathing/dressing/bathroom;Assistance with cooking/housework;Assistance with feeding;Direct supervision/assist for medications management;Direct supervision/assist for financial management;Assist for transportation;Help with stairs or ramp for entrance ?  ?Equipment Recommendations ? Rolling walker (2  wheels);BSC/3in1;Wheelchair (measurements PT);Wheelchair cushion (measurements PT);Hospital bed;Other (comment)  ?  ?Recommendations for Other Services   ? ? ?  ?Precautions / Restrictions Precautions ?Precautions: Posterior Hip;Fall ?Precaution Booklet Issued: Yes (comment) ?Precaution Comments: comp fractures ?Restrictions ?Weight Bearing Restrictions: Yes ?LLE Weight Bearing: Weight bearing as tolerated  ?  ? ?Mobility ? Bed Mobility ?Overal bed mobility: Needs Assistance ?Bed Mobility: Sit to Supine ?  ?  ?  ?Sit to supine: Max assist ?  ?General bed mobility comments: patient declined due to fatigue and having got back to bed not long ago, agreeable to in bed exercises ?  ? ?Transfers ?Overall transfer level: Needs assistance ?Equipment used: Rolling walker (2 wheels) ?Transfers: Bed to chair/wheelchair/BSC ?Sit to Stand: Max assist ?  ?Step pivot transfers: Max assist ?  ?  ?  ?General transfer comment: faciliation for lifting and lowering provided with transfers. verbal cues for technique. facilition for weight shifting to right for advancement of LLE with cues provided to increase step length. patient has difficulty with weight shifting to left for foot clearance of RLE. she is fatigued with activity. total of 2 standng bouts performed with seated rest breakd in beween bouts of activity ?  ? ?Ambulation/Gait ?  ?  ?  ?  ?  ?  ?  ?General Gait Details: limited standing tolerance for progression of ambulation ? ? ?Stairs ?  ?  ?  ?  ?  ? ? ?Wheelchair Mobility ?  ? ?Modified Rankin (Stroke Patients Only) ?  ? ? ?  ?Balance   ?  ?  ?  ?  ?  ?  ?  ?  ?  ?  ?  ?  ?  ?  ?  ?  ?  ?  ?  ? ?  ?Cognition Arousal/Alertness: Awake/alert ?Behavior During  Therapy: WFL for tasks assessed/performed ?Overall Cognitive Status: Within Functional Limits for tasks assessed ?  ?  ?  ?  ?  ?  ?  ?  ?  ?  ?  ?  ?  ?  ?  ?  ?General Comments: patient is able to follow all commands with extra time ?  ?  ? ?  ?Exercises Total Joint  Exercises ?Ankle Circles/Pumps: AROM, Strengthening, Both, 10 reps, Supine (2 sets of 10 reps each) ?Quad Sets: AROM, Strengthening, Both, 10 reps, Supine ?Gluteal Sets: AROM, Strengthening, Both, 10 reps, Supine ?Towel Squeeze: AROM, Strengthening, Both, 10 reps, Supine ?Short Arc Quad: AAROM, Strengthening, Both, 10 reps, Supine ?Heel Slides: AAROM, Strengthening, Both, 10 reps, Supine ?Hip ABduction/ADduction: AAROM, Strengthening, Both, 10 reps, Supine ?Other Exercises ?Other Exercises: verbal and visual cues for exercise technique. one rest break required during exercise due to fatigue ? ?  ?General Comments   ?  ?  ? ?Pertinent Vitals/Pain Pain Assessment ?Pain Assessment: Faces ?Faces Pain Scale: Hurts a little bit ?Pain Location: left hip, chronic back pain ?Pain Descriptors / Indicators: Discomfort ?Pain Intervention(s): Limited activity within patient's tolerance  ? ? ?Home Living   ?  ?  ?  ?  ?  ?  ?  ?  ?  ?   ?  ?Prior Function    ?  ?  ?   ? ?PT Goals (current goals can now be found in the care plan section) Acute Rehab PT Goals ?Patient Stated Goal: get better and go home ?PT Goal Formulation: With patient ?Time For Goal Achievement: 06/22/21 ?Potential to Achieve Goals: Fair ?Progress towards PT goals: Progressing toward goals ? ?  ?Frequency ? ? ? BID ? ? ? ?  ?PT Plan Current plan remains appropriate  ? ? ?Co-evaluation   ?  ?  ?  ?  ? ?  ?AM-PAC PT "6 Clicks" Mobility   ?Outcome Measure ? Help needed turning from your back to your side while in a flat bed without using bedrails?: A Lot ?Help needed moving from lying on your back to sitting on the side of a flat bed without using bedrails?: A Lot ?Help needed moving to and from a bed to a chair (including a wheelchair)?: A Lot ?Help needed standing up from a chair using your arms (e.g., wheelchair or bedside chair)?: A Lot ?Help needed to walk in hospital room?: Total ?Help needed climbing 3-5 steps with a railing? : Total ?6 Click Score: 10 ? ?   ?End of Session   ?Activity Tolerance: Patient tolerated treatment well ?Patient left: in bed;with call bell/phone within reach;with bed alarm set;with family/visitor present ?  ?PT Visit Diagnosis: Muscle weakness (generalized) (M62.81);Difficulty in walking, not elsewhere classified (R26.2);Other abnormalities of gait and mobility (R26.89);Pain;Unsteadiness on feet (R26.81) ?Pain - Right/Left: Left ?Pain - part of body: Hip ?  ? ? ?Time: 5726-2035 ?PT Time Calculation (min) (ACUTE ONLY): 17 min ? ?Charges:  $Therapeutic Exercise: 8-22 mins          ?          ? ?Minna Merritts, PT, MPT ? ? ? ?Percell Locus ?06/14/2021, 3:24 PM ? ?

## 2021-06-14 NOTE — Telephone Encounter (Signed)
Due in November per cancellation note.  ?

## 2021-06-14 NOTE — TOC Progression Note (Signed)
Transition of Care (TOC) - Progression Note  ? ? ?Patient Details  ?Name: NOHEMY KOOP ?MRN: [id number] ?Date of Birth: August 20, 1940 ? ?Transition of Care (TOC) CM/SW Contact  ?Alberteen Sam, LCSW ?Phone Number: ?06/14/2021, 1:23 PM ? ?Clinical Narrative:    ? ?CSW notes per cards patient will get another 24 hours iv lasix.  ? ?Patient has insurance auth for [employer] when medically stable, Merchant navy officer at [facility] informed.  ? ?Pending medical readiness to dc to [facility].  ? ?  ?  ? ?Expected Discharge Plan and Services ?  ?  ?  ?  ?  ?                ?  ?  ?  ?  ?  ?  ?  ?  ?  ?  ? ? ?Social Determinants of Health (SDOH) Interventions ?  ? ?Readmission Risk Interventions ?   ? View : No data to display.  ?  ?  ?  ? ? ?

## 2021-06-15 DIAGNOSIS — G9341 Metabolic encephalopathy: Secondary | ICD-10-CM

## 2021-06-15 DIAGNOSIS — I214 Non-ST elevation (NSTEMI) myocardial infarction: Secondary | ICD-10-CM | POA: Diagnosis not present

## 2021-06-15 DIAGNOSIS — S72002A Fracture of unspecified part of neck of left femur, initial encounter for closed fracture: Secondary | ICD-10-CM | POA: Diagnosis not present

## 2021-06-15 LAB — COMPREHENSIVE METABOLIC PANEL
ALT: 28 U/L (ref 0–44)
AST: 38 U/L (ref 15–41)
Albumin: 3.7 g/dL (ref 3.5–5.0)
Alkaline Phosphatase: 66 U/L (ref 38–126)
Anion gap: 13 (ref 5–15)
BUN: 17 mg/dL (ref 8–23)
CO2: 28 mmol/L (ref 22–32)
Calcium: 9.1 mg/dL (ref 8.9–10.3)
Chloride: 96 mmol/L — ABNORMAL LOW (ref 98–111)
Creatinine, Ser: 1.1 mg/dL — ABNORMAL HIGH (ref 0.44–1.00)
GFR, Estimated: 51 mL/min — ABNORMAL LOW (ref >60)
Glucose, Bld: 179 mg/dL — ABNORMAL HIGH (ref 70–99)
Potassium: 3.3 mmol/L — ABNORMAL LOW (ref 3.5–5.1)
Sodium: 137 mmol/L (ref 135–145)
Total Bilirubin: 1.1 mg/dL (ref 0.3–1.2)
Total Protein: 7.1 g/dL (ref 6.5–8.1)

## 2021-06-15 LAB — CBC WITH DIFFERENTIAL/PLATELET
Abs Immature Granulocytes: 0.06 10*3/uL (ref 0.00–0.07)
Basophils Absolute: 0 10*3/uL (ref 0.0–0.1)
Basophils Relative: 0 %
Eosinophils Absolute: 0.2 10*3/uL (ref 0.0–0.5)
Eosinophils Relative: 3 %
HCT: 34 % — ABNORMAL LOW (ref 36.0–46.0)
Hemoglobin: 10.9 g/dL — ABNORMAL LOW (ref 12.0–15.0)
Immature Granulocytes: 1 %
Lymphocytes Relative: 14 %
Lymphs Abs: 1.1 10*3/uL (ref 0.7–4.0)
MCH: 31.5 pg (ref 26.0–34.0)
MCHC: 32.1 g/dL (ref 30.0–36.0)
MCV: 98.3 fL (ref 80.0–100.0)
Monocytes Absolute: 0.5 10*3/uL (ref 0.1–1.0)
Monocytes Relative: 6 %
Neutro Abs: 5.9 10*3/uL (ref 1.7–7.7)
Neutrophils Relative %: 76 %
Platelets: 347 10*3/uL (ref 150–400)
RBC: 3.46 MIL/uL — ABNORMAL LOW (ref 3.87–5.11)
RDW: 17.2 % — ABNORMAL HIGH (ref 11.5–15.5)
WBC: 7.7 10*3/uL (ref 4.0–10.5)
nRBC: 0 % (ref 0.0–0.2)

## 2021-06-15 LAB — MAGNESIUM: Magnesium: 2.3 mg/dL (ref 1.7–2.4)

## 2021-06-15 MED ORDER — METOPROLOL SUCCINATE ER 25 MG PO TB24: 12.5000 mg | ORAL_TABLET | Freq: Every day | ORAL | Status: DC

## 2021-06-15 MED ORDER — ASPIRIN 81 MG PO CHEW: 81.0000 mg | CHEWABLE_TABLET | Freq: Every day | ORAL | Status: DC

## 2021-06-15 MED ORDER — POLYETHYLENE GLYCOL 3350 17 G PO PACK: 17.0000 g | PACK | Freq: Every day | ORAL | 0 refills | Status: DC

## 2021-06-15 MED ORDER — TORSEMIDE 20 MG PO TABS
20.0000 mg | ORAL_TABLET | Freq: Every day | ORAL | Status: DC
Start: 2021-06-16 — End: 2021-06-16

## 2021-06-15 MED ORDER — MIDODRINE HCL 5 MG PO TABS: 5.0000 mg | ORAL_TABLET | Freq: Three times a day (TID) | ORAL | Status: DC

## 2021-06-15 MED ORDER — POTASSIUM CHLORIDE CRYS ER 20 MEQ PO TBCR
40.0000 meq | EXTENDED_RELEASE_TABLET | Freq: Once | ORAL | Status: AC
Start: 2021-06-15 — End: 2021-06-15
  Administered 2021-06-15: 40 meq via ORAL
  Filled 2021-06-15: qty 2

## 2021-06-15 MED ORDER — ALPRAZOLAM 0.25 MG PO TABS
0.1250 mg | ORAL_TABLET | Freq: Two times a day (BID) | ORAL | Status: DC | PRN
  Administered 2021-06-15 (×2): 0.125 mg via ORAL
  Filled 2021-06-15 (×2): qty 1

## 2021-06-15 NOTE — Plan of Care (Signed)

## 2021-06-15 NOTE — Discharge Summary (Signed)
?Physician Discharge Summary ?  ?Patient: [name] MRN: [id number] DOB: 1941-01-31  ?Admit date:     06/05/2021  ?Discharge date: 06/15/21  ?Discharge Physician: [name]  ? ?PCP: McLean-Scocuzza, Nino Glow, MD  ? ?Recommendations at discharge:  ? ? Follow up with orthopedic surgery within 2 weeks ? ?Discharge Diagnoses: ?Principal Problem: ?  Closed left hip fracture ([name]) ?Active Problems: ?  Acute on chronic HFrEF (heart failure with reduced ejection fraction) ([place]) ?  Non-ST elevation (NSTEMI) myocardial infarction [facility]) ?  Essential hypertension ?  Constipation ?  Hyperlipidemia ?  Fall ?  Hypokalemia ? ?Resolved Problems: ?  Elevated troponin ?  AKI (acute kidney injury) ([place]) ?  Acute metabolic encephalopathy ? ?Hospital Course: ?[name] is a 81 yo female with PMH CAD, chronic fatigue, chronic sCHF, mitral regurgitation, osteoporosis, anxiety, arthritis, chronic back pain who presented after a mechanical fall resulting in a left femoral neck fracture.  She underwent left hip hemiarthroplasty on 06/07/2021 with orthopedic surgery.  She had uptrending troponins and was also evaluated by cardiology.  She was started on diuresis due to overhydration in the postop period.  Blood pressure medications were also modified due to some hypotension requiring use of midodrine.  She was also recommended to undergo heart catheterization for further evaluation during this hospitalization given her NSTEMI. She underwent LHC on 4/12 and received 2 DES.  ? ?Assessment and Plan: ?* Closed left hip fracture (HCC) ?- s/p mechanical fall ?- underwent left hip hemiarthroplasty on 06/07/2021 ?-Continue working with physical therapy.  ?- discharge to [facility] for further rehab ?- on asa/plavix ? ?Non-ST elevation (NSTEMI) myocardial infarction [facility]) ?- was initially started on IV heparin with uptrending trops ?- s/p LHC on 4/12; received 2 DES (prox and mid LAD) ?- continue plavix, toprol ?- statin seems to have been held  previously due to elevated LFTs ?- LFTs now improved and normalized; statin has been resumed  ? ?Acute on chronic HFrEF (heart failure with reduced ejection fraction) ([place]) ?- Entresto discontinued due to soft blood pressures ?-Cardiology followed in hospital ?- diuretic changed to torsemide 20 mg daily at discharge ?- Continue Toprol ?-Echo this hospitalization shows EF 25 to 30%, global hypokinesis, grade 2 diastolic dysfunction, mildly enlarged RV; LVH ? ?Essential hypertension ?- soft BP, requiring midodrine ?- currently on lasix, Toprol ?- ARB on hold until further BP improvement ? ?Constipation ?- Patient complaining of constipation with no bowel movement recently ?- Takes magnesium at home with good results she says ?- Bowel regimen modified for now ? ?Hypokalemia ?- Repleted ? ?Hyperlipidemia ?- crestor resumed; LFTs improved ? ?Acute metabolic encephalopathy-resolved as of 06/13/2021 ?- patient symptoms include lethargy ?- etiology considered due to metabolic effect from pain meds noted on 4/7 ?- mentation has improved and remained stable ? ? ?AKI (acute kidney injury) (HCC)-resolved as of 06/13/2021 ?- baseline creatinine ~ 1 ?- patient presents with increase in creat >0.3 mg/dL above baseline, creat increase >1.5x baseline presumed to have occurred within past 7 days PTA ?- has improved with diuresis given volume overload/CRS ? ? ?Elevated troponin-resolved as of 06/14/2021 ?- See NSTEMI ? ? ?  ? ? ?Consultants:  ?Orthopedic surgery ?Cardiology ?Procedures performed:  ?Left hip hemiarthroplasty, 06/07/2021 ?Left heart cath, 06/13/2021 ?Disposition: Skilled nursing facility ?Diet recommendation:  ?Discharge Diet Orders (From admission, onward)  ? ?  Start     Ordered  ? 06/15/21 0000  Diet - low sodium heart healthy       ?  06/15/21 1325  ? ?  ?  ? ?  ? ?Cardiac diet ?DISCHARGE MEDICATION: ?Allergies as of 06/15/2021   ? ?   Reactions  ? Sugar-protein-starch Nausea And Vomiting  ? Only to sugar  ? ?  ? ?   ?Medication List  ?  ? ?STOP taking these medications   ? ?Entresto 24-26 MG ?Generic drug: sacubitril-valsartan ?  ? ?  ? ?TAKE these medications   ? ?ALPRAZolam 0.25 MG tablet ?Commonly known as: Duanne Moron ?TAKE ONE TABLET BY MOUTH ONCE TO TWICE DAILY AS NEEDED ?  ?aspirin 81 MG chewable tablet ?Chew 1 tablet (81 mg total) by mouth daily. ?Start taking on: June 16, 2021 ?  ?citalopram 10 MG tablet ?Commonly known as: CeleXA ?Take 1 tablet (10 mg total) by mouth daily. ?  ?clopidogrel 75 MG tablet ?Commonly known as: PLAVIX ?TAKE ONE TABLET BY MOUTH ONE TIME DAILY ?  ?denosumab 60 MG/ML Sosy injection ?Commonly known as: PROLIA ?Inject 60 mg into the skin every 6 (six) months. ?  ?Fluzone High-Dose Quadrivalent 0.7 ML Susy ?Generic drug: Influenza Vac High-Dose Quad ?  ?ipratropium 0.06 % nasal spray ?Commonly known as: ATROVENT ?Place 2 sprays into both nostrils 2 (two) times daily as needed for rhinitis. ?  ?LIQUID CALCIUM PO ?Take by mouth. 1 tablespoon daily ?  ?metoprolol succinate 25 MG 24 hr tablet ?Commonly known as: TOPROL-XL ?Take 0.5 tablets (12.5 mg total) by mouth daily. ?Start taking on: June 16, 2021 ?What changed: how much to take ?  ?midodrine 5 MG tablet ?Commonly known as: PROAMATINE ?Take 1 tablet (5 mg total) by mouth 3 (three) times daily with meals. ?  ?ondansetron 4 MG disintegrating tablet ?Commonly known as: ZOFRAN-ODT ?TAKE 1 TABLET BY MOUTH TWICE A DAY AS NEEDED FOR NAUSEA AND VOMITING appt further refills no exceptions ?  ?oxyCODONE 5 MG immediate release tablet ?Commonly known as: Oxy IR/ROXICODONE ?Take 1-2 tablets (5-10 mg total) by mouth every 4 (four) hours as needed for moderate pain (pain score 4-6). ?  ?Pfizer COVID-19 Vac Bivalent injection ?Generic drug: COVID-19 mRNA bivalent vaccine [employer]) ?  ?polyethylene glycol 17 g packet ?Commonly known as: MIRALAX / GLYCOLAX ?Take 17 g by mouth daily. ?Start taking on: June 16, 2021 ?  ?rosuvastatin 40 MG tablet ?Commonly known as:  CRESTOR ?TAKE ONE TABLET BY MOUTH ONE TIME DAILY AT 6PM ?  ?torsemide 20 MG tablet ?Commonly known as: DEMADEX ?TAKE ONE TABLET BY MOUTH ONE TIME DAILY, EXCEPT DO NOT TAKE ON SUNDAYS ?  ?traMADol 50 MG tablet ?Commonly known as: ULTRAM ?Take 1 tablet (50 mg total) by mouth every 6 (six) hours as needed for moderate pain. ?  ? ?  ? ?  ?  ? ? ?  ?Discharge Care Instructions  ?(From admission, onward)  ?  ? ? ?  ? ?  Start     Ordered  ? 06/15/21 0000  Discharge wound care:       ?Comments: Keep surgical dressing in place and clean/dry/intact  ? 06/15/21 1325  ? ?  ?  ? ?  ? ? Contact information for follow-up providers   ? ? Reche Dixon, PA-C Follow up in 2 week(s).   ?Specialty: Orthopedic Surgery ?Why: For staple removal and x-rays of the left hip ?Contact information: ?[address] ?[place] ?[place] [address] ?[phone] ? ? ?  ?  ? ?  ?  ? ? Contact information for after-discharge care   ? ? Destination   ? ?  [facility] .   ?Service: Skilled Nursing ?Contact information: ?[address] ?[place] ?[phone] ? ?  ?  ? ?  ?  ? ?  ?  ? ?  ? ?Discharge Exam: ?Danley Danker Weights  ? 06/05/21 1652  ?Weight: 68 kg  ? ?Physical Exam ?Constitutional:   ?   Appearance: Normal appearance.  ?HENT:  ?   Head: Normocephalic and atraumatic.  ?   Mouth/Throat:  ?   Mouth: Mucous membranes are moist.  ?Eyes:  ?   Extraocular Movements: Extraocular movements intact.  ?Cardiovascular:  ?   Rate and Rhythm: Normal rate and regular rhythm.  ?Abdominal:  ?   General: Bowel sounds are normal. There is no distension.  ?   Palpations: Abdomen is soft.  ?   Tenderness: There is no abdominal tenderness.  ?Musculoskeletal:     ?   General: Normal range of motion.  ?   Cervical back: Normal range of motion and neck supple.  ?   Comments: Left hip surgical dressings in place.  Compartments soft but swollen.  Tender but appropriate  ?Skin: ?   General: Skin is warm  and dry.  ?Neurological:  ?   General: No focal deficit present.  ?   Mental Status: She is alert and oriented to person, place, and time.  ?Psychiatric:     ?   Mood and Affect: Mood normal.     ?   Behavior: Behavior

## 2021-06-15 NOTE — TOC Transition Note (Signed)
Transition of Care (TOC) - CM/SW Discharge Note ? ? ?Patient Details  ?Name: SIDNEE GAMBRILL ?MRN: [id number] ?Date of Birth: 07-31-1940 ? ?Transition of Care (TOC) CM/SW Contact:  ?Alberteen Sam, LCSW ?Phone Number: ?06/15/2021, 2:31 PM ? ? ?Clinical Narrative:    ? ?Patient will DC to:[facility] ?Anticipated DC date: 06/15/21 ?Transport by: [facility] ? ?Per MD patient ready for DC to [facility]. RN, patient, patient's family, and facility notified of DC. Discharge Summary sent to facility. RN given number for report   [phone] Room 224 B. DC packet on chart. Ambulance transport requested for patient.  ?CSW signing off. ? ?Pricilla Riffle, LCSW ? ? ? ?Final next level of care: [place] ?Barriers to Discharge: No Barriers Identified ? ? ?Patient Goals and CMS Choice ?Patient states their goals for this hospitalization and ongoing recovery are:: to go home ?CMS Medicare.gov Compare Post Acute Care list provided to:: Patient ?Choice offered to / list presented to : Patient ? ?Discharge Placement ?  ?           ?Patient chooses bed at: [place] ?Patient to be transferred to facility by: [facility] ?  ?Patient and family notified of of transfer: 06/15/21 ? ?Discharge Plan and Services ?  ?  ?           ?  ?  ?  ?  ?  ?  ?  ?  ?  ?  ? ?Social Determinants of Health (SDOH) Interventions ?  ? ? ?Readmission Risk Interventions ?   ? View : No data to display.  ?  ?  ?  ? ? ? ? ? ?

## 2021-06-15 NOTE — Progress Notes (Signed)
Physical Therapy Treatment ?Patient Details ?Name: [name] ?MRN: [id number] ?DOB: Jun 27, 1940 ?Today's Date: 06/15/2021 ? ? ?History of Present Illness Pt is a 81 y.o. female presenting to hospital 4/4 s/p fall (lost balance walking without walker use).  Imaging showing acute comminuted displaced impacted L femoral neck fx; age-indeterminate fx of the R posterolateral 4th rib with mild displacement; multiple remote appearing thoracic compression fx's (new at T5 and T6 since prior imaging).  Pt admitted with closed L hip fx and NSTEMI.  S/p L hip hemiarthroplasty 4/6.  PMH includes CAD, anxiety, OA, ischemic cariomyopathy, L BBB, moderate-severe mitral regurgitation, UTI, CHF, Hepatitis C, chronic back pain, chronic fatigue, back sx. ? ?  ?PT Comments  ? ? Pt was long sitting in bed upon arriving. She was pre-medicated for pain and anxiety however still limited throughout session. Pt continues to endorse severe pain with all movements. Author educated pt at length about needing to push through the pain and anxiety to maximize her abilities while decreasing the amount of time she will have to be at [facility]. Pt understands she is behind and does put forth slightly better effort today. She continues to require extensive assistance an is unable to ambulate. She was able to stand pivot to recliner with use of RW + increased time. Pt c/o RUE pain when in standing. Overall pt is progressing but slowly. Highly recommend DC to SNF to address deficits while maximizing independence with all ADLs.   ?Recommendations for follow up therapy are one component of a multi-disciplinary discharge planning process, led by the attending physician.  Recommendations may be updated based on patient status, additional functional criteria and insurance authorization. ? ?Follow Up Recommendations ? Skilled nursing-short term rehab (<3 hours/day) ?  ?  ?Assistance Recommended at Discharge Frequent or constant Supervision/Assistance  ?Patient can  return home with the following A lot of help with walking and/or transfers;A lot of help with bathing/dressing/bathroom;Assistance with cooking/housework;Assistance with feeding;Direct supervision/assist for medications management;Direct supervision/assist for financial management;Assist for transportation;Help with stairs or ramp for entrance ?  ?Equipment Recommendations ? Rolling walker (2 wheels);BSC/3in1;Wheelchair (measurements PT);Wheelchair cushion (measurements PT);Hospital bed;Other (comment)  ?  ?   ?Precautions / Restrictions Precautions ?Precautions: Posterior Hip;Fall ?Precaution Booklet Issued: Yes (comment) ?Precaution Comments: comp fractures ?Restrictions ?Weight Bearing Restrictions: Yes ?LLE Weight Bearing: Weight bearing as tolerated  ?  ? ?Mobility ? Bed Mobility ?Overal bed mobility: Needs Assistance ?Bed Mobility: Supine to Sit ?  ?  ?Supine to sit: Mod assist, Max assist, HOB elevated ?  ?  ?General bed mobility comments: increased time to perform due to pain and pt's anxiety with movement ?  ? ?Transfers ?Overall transfer level: Needs assistance ?Equipment used: Rolling walker (2 wheels) ?Transfers: Sit to/from Stand ?Sit to Stand: Mod assist ?Stand pivot transfers: Mod assist ?  ?  ?  ?  ?General transfer comment: pt was able to stand with less assistance and vcs today versus two days prior. she did clear L/R LE at EOB but ultimately needed to pivot to get to recliner. ?  ? ?Ambulation/Gait ?Ambulation/Gait assistance: Max assist ?Gait Distance (Feet): 2 Feet ?Assistive device: Rolling walker (2 wheels) ?Gait Pattern/deviations: Narrow base of support ?Gait velocity: decreased ?  ?  ?General Gait Details: was able to clear RLE from floor with max assistance with wt shift + constant vcs for encouragement ? ? ?  ?Balance Overall balance assessment: Needs assistance ?Sitting-balance support: Bilateral upper extremity supported, Feet supported ?Sitting balance-Leahy Scale: Fair ?  ?  ?  Standing  balance support: Bilateral upper extremity supported, During functional activity, Reliant on assistive device for balance ?Standing balance-Leahy Scale: Fair ?  ?   ?Cognition Arousal/Alertness: Awake/alert ?Behavior During Therapy: Anxious ?Overall Cognitive Status: Within Functional Limits for tasks assessed ?  ? General Comments: pt is A and O x 4 but extremely self limiting and pain limited ?  ?  ? ?  ?   ?   ? ?Pertinent Vitals/Pain Pain Assessment ?Pain Assessment: 0-10 ?Pain Score: 8  ?Faces Pain Scale: Hurts whole lot ?Pain Location: left hip, chronic back pain ?Pain Descriptors / Indicators: Discomfort ?Pain Intervention(s): Limited activity within patient's tolerance, Monitored during session, Premedicated before session, Repositioned  ? ? ? ?PT Goals (current goals can now be found in the care plan section) Acute Rehab PT Goals ?Patient Stated Goal: get better and go home ?Progress towards PT goals: Progressing toward goals ? ?  ?Frequency ? ? ? BID ? ? ? ?  ?PT Plan Current plan remains appropriate  ? ? ?Co-evaluation   ?  ?PT goals addressed during session: Mobility/safety with mobility ?  ?  ? ?  ?AM-PAC PT "6 Clicks" Mobility   ?Outcome Measure ? Help needed turning from your back to your side while in a flat bed without using bedrails?: A Lot ?Help needed moving from lying on your back to sitting on the side of a flat bed without using bedrails?: A Lot ?Help needed moving to and from a bed to a chair (including a wheelchair)?: A Lot ?Help needed standing up from a chair using your arms (e.g., wheelchair or bedside chair)?: A Lot ?Help needed to walk in hospital room?: A Lot ?Help needed climbing 3-5 steps with a railing? : Total ?6 Click Score: 11 ? ?  ?End of Session   ?Activity Tolerance: Patient limited by pain ?Patient left: in chair;with call bell/phone within reach;with chair alarm set;with family/visitor present ?Nurse Communication: Mobility status ?PT Visit Diagnosis: Muscle weakness  (generalized) (M62.81);Difficulty in walking, not elsewhere classified (R26.2);Other abnormalities of gait and mobility (R26.89);Pain;Unsteadiness on feet (R26.81) ?Pain - Right/Left: Left ?Pain - part of body: Hip ?  ? ? ?Time: 4158-3094 ?PT Time Calculation (min) (ACUTE ONLY): 13 min ? ?Charges:  $Therapeutic Activity: 8-22 mins          ?          ?Julaine Fusi PTA ?06/15/21, 9:47 AM  ? ?

## 2021-06-15 NOTE — Progress Notes (Signed)
? ?Progress Note ? ?Patient Name: [name] ?Date of Encounter: 06/15/2021 ? ?[facility] Cardiologist: Ida Rogue, MD  ? ?Subjective  ? ?Feels well, no complaints ?Not motivated to get up and move around ?Somewhat frustrated with her condition ? ?Inpatient Medications  ?  ?Scheduled Meds: ? aspirin  81 mg Oral Daily  ? calcium-vitamin D  1 tablet Oral Q breakfast  ? clopidogrel  75 mg Oral Daily  ? enoxaparin (LOVENOX) injection  40 mg Subcutaneous Q24H  ? furosemide  40 mg Intravenous Q12H  ? lactulose  20 g Oral Daily  ? metoprolol succinate  12.5 mg Oral Daily  ? midodrine  5 mg Oral TID WC  ? multivitamin with minerals  1 tablet Oral Daily  ? polyethylene glycol  17 g Oral Daily  ? rosuvastatin  40 mg Oral Daily  ? senna-docusate  2 tablet Oral BID  ? ?Continuous Infusions: ? ?PRN Meds: ?acetaminophen, ALPRAZolam, bisacodyl, ipratropium, magnesium hydroxide, menthol-cetylpyridinium **OR** phenol, nitroGLYCERIN, traMADol  ? ?Vital Signs  ?  ?Vitals:  ? 06/14/21 1959 06/15/21 0045 06/15/21 0325 06/15/21 0744  ?BP: (!) 109/42 (!) 117/45 (!) 116/55 (!) 112/48  ?Pulse: 80 82 84 75  ?Resp: 18 17 18 19   ?Temp: 97.8 ?F (36.6 ?C) 97.8 ?F (36.6 ?C) 97.7 ?F (36.5 ?C) 98.3 ?F (36.8 ?C)  ?TempSrc: Oral Oral Oral Oral  ?SpO2: 94% 95% 97% 97%  ?Weight:      ? ? ?Intake/Output Summary (Last 24 hours) at 06/15/2021 1117 ?Last data filed at 06/15/2021 1036 ?Gross per 24 hour  ?Intake 840 ml  ?Output 2120 ml  ?Net -1280 ml  ? ? ?  06/05/2021  ?  4:52 PM 05/08/2021  ?  2:22 PM 03/01/2021  ? 11:23 AM  ?Last 3 Weights  ?Weight (lbs) 150 lb 147 lb 2 oz 142 lb  ?Weight (kg) 68.04 kg 66.735 kg 64.411 kg  ?   ? ?Telemetry  ?  ?NSR - Personally Reviewed ? ?ECG  ?  ? - Personally Reviewed ? ?Physical Exam  ? ?GEN: No acute distress.   ?Neck: Unable to estimate JVD ?Cardiac: RRR, no murmurs, rubs, or gallops.  ?Respiratory: Clear to auscultation bilaterally. ?GI: Soft, nontender, non-distended  ?MS: No edema; No deformity. ?Neuro:   Nonfocal  ?Psych: Normal affect  ? ?Labs  ?  ?High Sensitivity Troponin:   ?Recent Labs  ?Lab 06/06/21 ?1601 06/06/21 ?0350 06/06/21 ?1132 06/06/21 ?1321 06/08/21 ?2156  ?TROPONINIHS 926* [id number]* 1,935* 1,888* 1,177*  ?   ?Chemistry ?Recent Labs  ?Lab 06/12/21 ?0428 06/13/21 ?0932 06/14/21 ?3557 06/15/21 ?0910  ?NA 139 138 136 137  ?K 3.1* 4.3 3.7 3.3*  ?CL 100 100 98 96*  ?CO2 29 29 28 28   ?GLUCOSE 120* 101* 125* 179*  ?BUN 29* 22 18 17   ?CREATININE 0.93 0.86 0.97 1.10*  ?CALCIUM 8.6* 9.1 8.9 9.1  ?MG  --  2.2 2.3 2.3  ?PROT 6.4*  --  6.9 7.1  ?ALBUMIN 3.2*  --  3.5 3.7  ?AST 39  --  34 38  ?ALT 39  --  27 28  ?ALKPHOS 69  --  67 66  ?BILITOT 1.3*  --  1.3* 1.1  ?GFRNONAA >60 >60 59* 51*  ?ANIONGAP 10 9 10 13   ?  ?Lipids No results for input(s): CHOL, TRIG, HDL, LABVLDL, LDLCALC, CHOLHDL in the last 168 hours.  ?Hematology ?Recent Labs  ?Lab 06/13/21 ?3220 06/14/21 ?2542 06/15/21 ?0910  ?WBC 8.7 8.5 7.7  ?RBC 3.32*  3.26* 3.46*  ?HGB 10.6* 10.5* 10.9*  ?HCT 32.8* 32.1* 34.0*  ?MCV 98.8 98.5 98.3  ?MCH 31.9 32.2 31.5  ?MCHC 32.3 32.7 32.1  ?RDW 14.7 16.0* 17.2*  ?PLT 287 304 347  ? ?Thyroid No results for input(s): TSH, FREET4 in the last 168 hours.  ?BNPNo results for input(s): BNP, PROBNP in the last 168 hours.  ?DDimer No results for input(s): DDIMER in the last 168 hours.  ? ?Radiology  ?  ?CARDIAC CATHETERIZATION ? ?Addendum Date: 06/14/2021   ?Conclusions: Severe single-vessel coronary artery disease with sequential 80-90% proximal/mid LAD stenoses flanking previously placed, which are highly significant by iFR and involve D1 and D2. Mild-moderate, nonobstructive CAD involving ramus intermedius and RCA. Widely patent mid LAD and mid RCA stents. Mildly elevated left and right heart filling pressures. Mild-moderate pulmonary hypertension. Mildly-moderately reduced Fick cardiac output/index. Successful PCI to proximal LAD using Onyx Frontier 3.0 x 12 mm drug-eluting stent (jailing D1) with 0% residual stenosis and  TIMI-3 flow. Successful PCI to mid LAD using Onyx Frontier 2.5 x 15 mm drug-eluting stent (jailing D2) with angioplasty of ostium of D2.  There is 0% residual stenosis in the LAD and 80% residual stenosis at the ostium of D2 with TIMI-3 flow. Challenging intervention via right radial approach due to tortuosity of the right subclavian artery and radial artery vasospasm.  Consider alternative access for future catheterizations. Recommendations: Dual antiplatelet therapy with aspirin and clopidogrel for at least 12 months. Aggressive secondary prevention of coronary artery disease. Continued gentle diuresis. Escalate goal-directed medical therapy for acute on chronic HFrEF. Nelva Bush, MD [facility]  ? ?Result Date: 06/14/2021 ?Conclusions: Severe single-vessel coronary artery disease with sequential 80-90% proximal/mid LAD stenoses flanking previously placed, which are highly significant by iFR and involve D1 and D2. Mild-moderate, nonobstructive CAD involving ramus intermedius and RCA. Widely patent mid LAD and mid RCA stents. Mildly elevated left and right heart filling pressures. Mild-moderate pulmonary hypertension. Mildly-moderately reduced Fick cardiac output/index. Successful PCI to proximal LAD using Onyx frontier 3.0 x 12 mm drug-eluting stent (jailing D1) with 0% residual stenosis and TIMI-3 flow. Successful PCI to mid LAD using Onyx frontier 2.5 x 15 mm drug-eluting stent (jailing D2) with angioplasty of ostium of D2.  There is 0% residual stenosis in the LAD and 80% residual stenosis at the ostium of D2 with TIMI-3 flow. Recommendations: Dual antiplatelet therapy with aspirin and clopidogrel for at least 12 months. Aggressive secondary prevention of coronary artery disease. Continued gentle diuresis. Escalate goal-directed medical therapy for acute on chronic HFrEF. Nelva Bush, MD [facility]  ? ?Cardiac Studies  ? ? ? ?Patient Profile  ?   ?81 y.o. female  CAD s/p PCI to the mid LAD and  mid RCA in 01/2015, HFrEF secondary to ICM, mitral regurgitation, LBBB, HTN, HLD, hepatitis C, CKD stage III, chronic back pain s/p multiple surgeries, and chronic UTIs, admitted with mechanical fall and left hip fracture complicated by NSTEMI and acute on chronic HFrEF. ? ?Assessment & Plan  ?  ?Non-STEMI,  ?Cardiac catheterization performed this admission with stent to proximal and mid LAD, prior LAD stent patent ? treated with IV Lasix past several days for elevated pressures measured on catheterization ?Denies significant shortness of breath ?On aspirin, Plavix, beta-blocker, Crestor  ?Recommend we restart torsemide 20 daily ?  ?Acute on chronic diastolic and systolic CHF ?Wedge pressure 22 on catheterization ?Has had aggressive diuresis this admission, minus close to 4 L ?-Unable to add ACE, ARB, Entresto secondary  to hypotension ?Remains on low-dose beta-blocker ?  ?Hypotension ?On midodrine, possibly exacerbated by pain medication, ischemic cardiomyopathy ?-Blood pressure stable, as outpatient we will look to restart low-dose Entresto ?  ?Left hip fracture ?Status post left hip hemiarthroplasty April 6 ?Scheduled to go to [facility] for rehab ?Recommend she participate in rehab ? ? ? Total encounter time more than 50 minutes ? Greater than 50% was spent in counseling and coordination of care with the patient ? ? ?For questions or updates, please contact [place] ?Please consult [email or web address] for contact info under  ? ?  ?   ?Signed, ?Ida Rogue, MD  ?06/15/2021, 11:17 AM    ?

## 2021-06-16 DIAGNOSIS — N1832 Chronic kidney disease, stage 3b: Secondary | ICD-10-CM | POA: Diagnosis not present

## 2021-06-16 DIAGNOSIS — I5023 Acute on chronic systolic (congestive) heart failure: Secondary | ICD-10-CM | POA: Diagnosis not present

## 2021-06-16 DIAGNOSIS — I959 Hypotension, unspecified: Secondary | ICD-10-CM | POA: Diagnosis not present

## 2021-06-16 DIAGNOSIS — Z7401 Bed confinement status: Secondary | ICD-10-CM | POA: Diagnosis not present

## 2021-06-16 DIAGNOSIS — Z743 Need for continuous supervision: Secondary | ICD-10-CM | POA: Diagnosis not present

## 2021-06-16 DIAGNOSIS — M6281 Muscle weakness (generalized): Secondary | ICD-10-CM | POA: Diagnosis not present

## 2021-06-16 DIAGNOSIS — S72002D Fracture of unspecified part of neck of left femur, subsequent encounter for closed fracture with routine healing: Secondary | ICD-10-CM | POA: Diagnosis not present

## 2021-06-16 DIAGNOSIS — J449 Chronic obstructive pulmonary disease, unspecified: Secondary | ICD-10-CM | POA: Diagnosis not present

## 2021-06-16 NOTE — Progress Notes (Signed)
CSW spoke to [facility] this morning on main number to inform EMS is here to transport to facility, as they were running behind 4/14 until 2am.  ? ?[facility] reports they will inform their nurse and are aware patient to go to Room 224 B.  ? Pricilla Riffle, [place] ?[phone] ? ?

## 2021-06-16 NOTE — Progress Notes (Addendum)
Patient transported off floor via stretcher by ems to facility. Packet given to ems staff. Abductor pillow placed prior to transport.  ?

## 2021-06-16 NOTE — Progress Notes (Addendum)
06/15/21 2211 Spoke with [facility] EMS regarding estimated time for pick up per patient and husband's request. Dispatcher reports that there are currently no trucks available and an estimated time for pickup cannot be given at this time. Patient expresses concerns that she is tired and just wants to sleep. Patient's husband expresses that he just wants his wife to be able to sleep and that he is tired as well. He feels like it doesn't matter because they will have to pay for her to stay in either the hospital bed or the bed at [facility] so she may as well sleep. Educated patient and family about EMS transport procedure and relayed information given by dispatcher. Notified charge nurse of patient and husband's refusal to be transferred tonight and request to be transferred in the morning if a truck is not already on the way.  ? ?06/15/21 2237 [facility] dispatcher back regarding patient's request to be transferred in the morning and was told that she would speak to her chief and schedule her for the morning because there were still no available trucks to transport patient. Charge nurse notified clinician on call of patient's refusal to be transferred tonight and request to be transferred in the morning. ? ?06/16/21 0023 Patient's husband reports that he will be leaving to go home for the night and wants to be notified when EMS arrives or ETA for pickup is given so that he can either be at the hospital with patient or meet her at the facility.  ?

## 2021-06-16 NOTE — Plan of Care (Signed)

## 2021-06-17 LAB — BLOOD GAS, VENOUS
Acid-base deficit: 4.5 mmol/L — ABNORMAL HIGH (ref 0.0–2.0)
Bicarbonate: 21.1 mmol/L (ref 20.0–28.0)
O2 Saturation: 88.4 %
Patient temperature: 37
pCO2, Ven: 40 mmHg — ABNORMAL LOW (ref 44–60)
pH, Ven: 7.33 (ref 7.25–7.43)
pO2, Ven: 55 mmHg — ABNORMAL HIGH (ref 32–45)

## 2021-06-18 ENCOUNTER — Telehealth: Payer: Self-pay | Admitting: Internal Medicine

## 2021-06-18 ENCOUNTER — Telehealth: Payer: Self-pay | Admitting: Cardiovascular Disease

## 2021-06-18 MED ORDER — ROSUVASTATIN CALCIUM 40 MG PO TABS: ORAL_TABLET | ORAL | 1 refills | Status: DC

## 2021-06-18 MED ORDER — CLOPIDOGREL BISULFATE 75 MG PO TABS: 75.0000 mg | ORAL_TABLET | Freq: Every day | ORAL | 1 refills | Status: DC

## 2021-06-18 MED ORDER — METOPROLOL SUCCINATE ER 25 MG PO TB24: 12.5000 mg | ORAL_TABLET | Freq: Every day | ORAL | 1 refills | Status: DC

## 2021-06-18 MED ORDER — TORSEMIDE 20 MG PO TABS: ORAL_TABLET | ORAL | 0 refills | Status: DC

## 2021-06-18 NOTE — Telephone Encounter (Signed)
Pt states that she needs clarification on what medications she's suppose to stop taking. Please advise ?

## 2021-06-18 NOTE — Telephone Encounter (Signed)
Pt need on oxyCODONE,  ALPRAZolam,  citalopram,  clopidogrel ipratropium,  metoprolol succinate, midodrine ondansetron,  polyethylene glycol, rosuvastatin, torsemide sent to [facility] ?

## 2021-06-18 NOTE — Telephone Encounter (Signed)
Scheduled for hospital fu appt  June 16th patient declined sooner.  ?

## 2021-06-18 NOTE — Telephone Encounter (Signed)
Spoke with the patient. ?Patient discharged from [facility] on 06/15/21. ?Pt underwent a left hip arthroplasty on 4/423 and a R& L heart cath with pci on 06/13/21. ?Pt was d/c to a SNF [facility]. ?Pt sts that she did not like the facility and signed herself out. She is currently at home. ?Pt sts that she needs refills of the medications that she was sent home with from the hospital. ?Confirmed with the patient that Delene Loll was stopped at d/c. Advised the patient that I will send in Rx for her cardiac medications to [facility] pharmacy. Asa 81 mg daily should be purchased over the counter. Advised the patient of the importance of not missing doses of her DAPT. ?Advised the patient that she will need to contact her pcp and the [facility] office for assistance with her non-cardiac medications. ? ?Pt verbalized understanding to the instructions given and voiced appreciation for the assistance.  ?

## 2021-06-20 ENCOUNTER — Telehealth: Payer: Self-pay | Admitting: Cardiovascular Disease

## 2021-06-20 NOTE — Telephone Encounter (Signed)
Pt c/o medication issue: ? ?1. Name of Medication:  ?midodrine (PROAMATINE) 5 MG tablet ? ?2. How are you currently taking this medication (dosage and times per day)?  ? ?3. Are you having a reaction (difficulty breathing--STAT)?  ? ?4. What is your medication issue?  ? ?Patient states this medication was prescribed in the hospital and she would like to know what it is for and to confirm that she needs to continue on it. If so, she will need a refill. ? ?

## 2021-06-20 NOTE — Telephone Encounter (Signed)
Please see below questions about midodrine.  ?

## 2021-06-21 MED ORDER — MIDODRINE HCL 5 MG PO TABS
5.0000 mg | ORAL_TABLET | Freq: Three times a day (TID) | ORAL | 3 refills | Status: DC
Start: 2021-06-21 — End: 2021-08-17

## 2021-06-21 NOTE — Telephone Encounter (Signed)
Follow Up: ? ? ? ? ?Patient is calling back to check on the statuson her Midodrine. She said the pharmacy did not have it. ?

## 2021-06-21 NOTE — Telephone Encounter (Signed)
Called to speak with patient.  ? ?Patient stated that she knew the medication was for blood clots but she just couldn't get a refill called in. Advised patient that midodrine is used for raising blood pressure, not blood clots. Asked patient if she had been keeping an eye on her blood pressure since being discharged from the hospital. Patient stated that she hasn't because she hasn't had time and hasn't wanted to. Advised her that she should start checking her blood pressure to make sure it was within normal limits, since she is taking a medication that affects it.  ? ?Advised patient that I would send a refill in.  ? ?Patient verbalized understanding of everything discussed and voiced appreciation for the call.  ?

## 2021-06-22 ENCOUNTER — Telehealth: Payer: Self-pay | Admitting: Internal Medicine

## 2021-06-22 NOTE — Telephone Encounter (Signed)
[name] from [place] is making a recommendation for Asprin and nitro glycerin  ?

## 2021-06-22 NOTE — Telephone Encounter (Signed)
Lvm for Funmi to reach out to Dr.Gollan's office @[facility] in [place]. Since he is the one who manages pt's Plavix and Aspirin. He can be reached @[phone] ?

## 2021-06-22 NOTE — Telephone Encounter (Signed)
I dont manage her aspirin nor plavix this would be cardiology Dr. Ida Rogue they need to call this office ? ?

## 2021-06-25 ENCOUNTER — Other Ambulatory Visit: Payer: Self-pay | Admitting: Internal Medicine

## 2021-06-25 DIAGNOSIS — R112 Nausea with vomiting, unspecified: Secondary | ICD-10-CM

## 2021-06-25 DIAGNOSIS — F419 Anxiety disorder, unspecified: Secondary | ICD-10-CM

## 2021-06-25 MED ORDER — ALPRAZOLAM 0.25 MG PO TABS: ORAL_TABLET | ORAL | 5 refills | Status: DC

## 2021-06-25 MED ORDER — CITALOPRAM HYDROBROMIDE 10 MG PO TABS: 10.0000 mg | ORAL_TABLET | Freq: Every day | ORAL | 3 refills | Status: DC

## 2021-06-25 MED ORDER — POLYETHYLENE GLYCOL 3350 17 G PO PACK: 17.0000 g | PACK | Freq: Every day | ORAL | 3 refills | Status: DC | PRN

## 2021-06-25 MED ORDER — ONDANSETRON 4 MG PO TBDP: ORAL_TABLET | ORAL | 11 refills | Status: AC

## 2021-06-25 NOTE — Telephone Encounter (Signed)
Pt made aware that meds have been refilled and sent to [facility] except for pain meds. She will need to reach out to ortho for them to refill. Pt verbalized understanding.  ?

## 2021-06-25 NOTE — Telephone Encounter (Signed)
Pain meds should come from ortho  ? ?

## 2021-07-03 ENCOUNTER — Ambulatory Visit (INDEPENDENT_AMBULATORY_CARE_PROVIDER_SITE_OTHER): Payer: Medicare HMO | Admitting: *Deleted

## 2021-07-03 DIAGNOSIS — M810 Age-related osteoporosis without current pathological fracture: Secondary | ICD-10-CM | POA: Diagnosis not present

## 2021-07-03 MED ORDER — DENOSUMAB 60 MG/ML ~~LOC~~ SOSY
60.0000 mg | PREFILLED_SYRINGE | Freq: Once | SUBCUTANEOUS | Status: AC
  Administered 2021-07-03: 60 mg via SUBCUTANEOUS

## 2021-07-03 NOTE — Progress Notes (Signed)
Patient presented for Prolia injection to Right arm [place], patient voiced no concerns or complaints during or after injection. 

## 2021-07-04 DIAGNOSIS — I509 Heart failure, unspecified: Secondary | ICD-10-CM | POA: Diagnosis not present

## 2021-07-04 DIAGNOSIS — Z7982 Long term (current) use of aspirin: Secondary | ICD-10-CM | POA: Diagnosis not present

## 2021-07-04 DIAGNOSIS — Z471 Aftercare following joint replacement surgery: Secondary | ICD-10-CM | POA: Diagnosis not present

## 2021-07-04 DIAGNOSIS — R69 Illness, unspecified: Secondary | ICD-10-CM | POA: Diagnosis not present

## 2021-07-04 DIAGNOSIS — M80052D Age-related osteoporosis with current pathological fracture, left femur, subsequent encounter for fracture with routine healing: Secondary | ICD-10-CM | POA: Diagnosis not present

## 2021-07-04 DIAGNOSIS — Z96642 Presence of left artificial hip joint: Secondary | ICD-10-CM | POA: Diagnosis not present

## 2021-07-04 DIAGNOSIS — Z9181 History of falling: Secondary | ICD-10-CM | POA: Diagnosis not present

## 2021-07-04 DIAGNOSIS — Z7902 Long term (current) use of antithrombotics/antiplatelets: Secondary | ICD-10-CM | POA: Diagnosis not present

## 2021-07-04 DIAGNOSIS — I110 Hypertensive heart disease with heart failure: Secondary | ICD-10-CM | POA: Diagnosis not present

## 2021-07-09 ENCOUNTER — Telehealth: Payer: Self-pay | Admitting: Cardiovascular Disease

## 2021-07-09 DIAGNOSIS — Z9181 History of falling: Secondary | ICD-10-CM | POA: Diagnosis not present

## 2021-07-09 DIAGNOSIS — Z7902 Long term (current) use of antithrombotics/antiplatelets: Secondary | ICD-10-CM | POA: Diagnosis not present

## 2021-07-09 DIAGNOSIS — Z7982 Long term (current) use of aspirin: Secondary | ICD-10-CM | POA: Diagnosis not present

## 2021-07-09 DIAGNOSIS — M80052D Age-related osteoporosis with current pathological fracture, left femur, subsequent encounter for fracture with routine healing: Secondary | ICD-10-CM | POA: Diagnosis not present

## 2021-07-09 DIAGNOSIS — R69 Illness, unspecified: Secondary | ICD-10-CM | POA: Diagnosis not present

## 2021-07-09 DIAGNOSIS — I509 Heart failure, unspecified: Secondary | ICD-10-CM | POA: Diagnosis not present

## 2021-07-09 DIAGNOSIS — I110 Hypertensive heart disease with heart failure: Secondary | ICD-10-CM | POA: Diagnosis not present

## 2021-07-09 DIAGNOSIS — Z96642 Presence of left artificial hip joint: Secondary | ICD-10-CM | POA: Diagnosis not present

## 2021-07-09 NOTE — Telephone Encounter (Signed)
Pt c/o swelling: STAT is pt has developed SOB within 24 hours ? ?How much weight have you gained and in what time span? Doesn't know.  ? ?If swelling, where is the swelling located? In both feet  ? ?Are you currently taking a fluid pill? torsemide (DEMADEX) 20 MG tablet ? ?Are you currently SOB? No ? ?Do you have a log of your daily weights (if so, list)? No ? ?Have you gained 3 pounds in a day or 5 pounds in a week? No ? ?Have you traveled recently? No ?  ?Pt just out of the hosp with a broken hip, but says she has swelling in her feet. Request call back.  ?

## 2021-07-10 NOTE — Telephone Encounter (Signed)
Called and spoke with patient.  ? ?Reports that she started having swelling in her feet when she was discharged from the hospital on 4/14.  It is in her feet and ankles.  ? ?She is sitting most of the day and not elevating her feet when sitting because that hurts her hip. She tried wearing compression socks yesterday but says that didn't seem to help.  ? ?Offered patient appt tomorrow, however patient said that she can't get in and out of the house very easily due to her hip.  ? ?Advised pt that I will forward to MD for reccs.  ?

## 2021-07-11 DIAGNOSIS — Z96642 Presence of left artificial hip joint: Secondary | ICD-10-CM | POA: Diagnosis not present

## 2021-07-11 DIAGNOSIS — I509 Heart failure, unspecified: Secondary | ICD-10-CM | POA: Diagnosis not present

## 2021-07-11 DIAGNOSIS — M80052D Age-related osteoporosis with current pathological fracture, left femur, subsequent encounter for fracture with routine healing: Secondary | ICD-10-CM | POA: Diagnosis not present

## 2021-07-11 DIAGNOSIS — Z9181 History of falling: Secondary | ICD-10-CM | POA: Diagnosis not present

## 2021-07-11 DIAGNOSIS — R69 Illness, unspecified: Secondary | ICD-10-CM | POA: Diagnosis not present

## 2021-07-11 DIAGNOSIS — Z7982 Long term (current) use of aspirin: Secondary | ICD-10-CM | POA: Diagnosis not present

## 2021-07-11 DIAGNOSIS — Z7902 Long term (current) use of antithrombotics/antiplatelets: Secondary | ICD-10-CM | POA: Diagnosis not present

## 2021-07-11 DIAGNOSIS — I110 Hypertensive heart disease with heart failure: Secondary | ICD-10-CM | POA: Diagnosis not present

## 2021-07-11 NOTE — Telephone Encounter (Signed)
Kelly Merritts, MD   ? ?I suspect her diuretics were held in the hospital and she probably received IV fluids  ?That would be why she is swelling now that her legs are down and she is no longer supine in the bed  ?Would recommend a torsemide 20 mg twice a day for 3 days then back to 20 mg daily  ?We need to get the fluid off  ?Would moderate fluid coming in  ?Thx  ?TGollan   ? ?Called patient and went over recommendation. Pt verbalized understanding.  ? ?Pt stated that she will take torsemide 20 mg twice a day for 3 days, then go back down to 20 mg daily.  ? ?Pt voiced appreciation for the call.  ?

## 2021-07-11 NOTE — Telephone Encounter (Signed)
Patient called to check on status of call.  °

## 2021-07-16 DIAGNOSIS — Z96642 Presence of left artificial hip joint: Secondary | ICD-10-CM | POA: Diagnosis not present

## 2021-07-16 DIAGNOSIS — R69 Illness, unspecified: Secondary | ICD-10-CM | POA: Diagnosis not present

## 2021-07-16 DIAGNOSIS — I509 Heart failure, unspecified: Secondary | ICD-10-CM | POA: Diagnosis not present

## 2021-07-16 DIAGNOSIS — Z7902 Long term (current) use of antithrombotics/antiplatelets: Secondary | ICD-10-CM | POA: Diagnosis not present

## 2021-07-16 DIAGNOSIS — Z9181 History of falling: Secondary | ICD-10-CM | POA: Diagnosis not present

## 2021-07-16 DIAGNOSIS — M80052D Age-related osteoporosis with current pathological fracture, left femur, subsequent encounter for fracture with routine healing: Secondary | ICD-10-CM | POA: Diagnosis not present

## 2021-07-16 DIAGNOSIS — Z7982 Long term (current) use of aspirin: Secondary | ICD-10-CM | POA: Diagnosis not present

## 2021-07-16 DIAGNOSIS — I110 Hypertensive heart disease with heart failure: Secondary | ICD-10-CM | POA: Diagnosis not present

## 2021-07-18 DIAGNOSIS — M80052D Age-related osteoporosis with current pathological fracture, left femur, subsequent encounter for fracture with routine healing: Secondary | ICD-10-CM | POA: Diagnosis not present

## 2021-07-18 DIAGNOSIS — Z7902 Long term (current) use of antithrombotics/antiplatelets: Secondary | ICD-10-CM | POA: Diagnosis not present

## 2021-07-18 DIAGNOSIS — Z7982 Long term (current) use of aspirin: Secondary | ICD-10-CM | POA: Diagnosis not present

## 2021-07-18 DIAGNOSIS — R69 Illness, unspecified: Secondary | ICD-10-CM | POA: Diagnosis not present

## 2021-07-18 DIAGNOSIS — Z9181 History of falling: Secondary | ICD-10-CM | POA: Diagnosis not present

## 2021-07-18 DIAGNOSIS — I110 Hypertensive heart disease with heart failure: Secondary | ICD-10-CM | POA: Diagnosis not present

## 2021-07-18 DIAGNOSIS — Z96642 Presence of left artificial hip joint: Secondary | ICD-10-CM | POA: Diagnosis not present

## 2021-07-18 DIAGNOSIS — I509 Heart failure, unspecified: Secondary | ICD-10-CM | POA: Diagnosis not present

## 2021-07-23 DIAGNOSIS — I509 Heart failure, unspecified: Secondary | ICD-10-CM | POA: Diagnosis not present

## 2021-07-23 DIAGNOSIS — I110 Hypertensive heart disease with heart failure: Secondary | ICD-10-CM | POA: Diagnosis not present

## 2021-07-23 DIAGNOSIS — R69 Illness, unspecified: Secondary | ICD-10-CM | POA: Diagnosis not present

## 2021-07-23 DIAGNOSIS — M80052D Age-related osteoporosis with current pathological fracture, left femur, subsequent encounter for fracture with routine healing: Secondary | ICD-10-CM | POA: Diagnosis not present

## 2021-07-23 DIAGNOSIS — Z96642 Presence of left artificial hip joint: Secondary | ICD-10-CM | POA: Diagnosis not present

## 2021-07-23 DIAGNOSIS — Z7982 Long term (current) use of aspirin: Secondary | ICD-10-CM | POA: Diagnosis not present

## 2021-07-23 DIAGNOSIS — Z9181 History of falling: Secondary | ICD-10-CM | POA: Diagnosis not present

## 2021-07-23 DIAGNOSIS — Z7902 Long term (current) use of antithrombotics/antiplatelets: Secondary | ICD-10-CM | POA: Diagnosis not present

## 2021-07-24 ENCOUNTER — Ambulatory Visit: Payer: Medicare HMO | Admitting: Cardiovascular Disease

## 2021-07-26 DIAGNOSIS — Z7982 Long term (current) use of aspirin: Secondary | ICD-10-CM | POA: Diagnosis not present

## 2021-07-26 DIAGNOSIS — Z96642 Presence of left artificial hip joint: Secondary | ICD-10-CM | POA: Diagnosis not present

## 2021-07-26 DIAGNOSIS — I509 Heart failure, unspecified: Secondary | ICD-10-CM | POA: Diagnosis not present

## 2021-07-26 DIAGNOSIS — R69 Illness, unspecified: Secondary | ICD-10-CM | POA: Diagnosis not present

## 2021-07-26 DIAGNOSIS — Z9181 History of falling: Secondary | ICD-10-CM | POA: Diagnosis not present

## 2021-07-26 DIAGNOSIS — Z7902 Long term (current) use of antithrombotics/antiplatelets: Secondary | ICD-10-CM | POA: Diagnosis not present

## 2021-07-26 DIAGNOSIS — I110 Hypertensive heart disease with heart failure: Secondary | ICD-10-CM | POA: Diagnosis not present

## 2021-07-26 DIAGNOSIS — M80052D Age-related osteoporosis with current pathological fracture, left femur, subsequent encounter for fracture with routine healing: Secondary | ICD-10-CM | POA: Diagnosis not present

## 2021-08-01 DIAGNOSIS — Z7982 Long term (current) use of aspirin: Secondary | ICD-10-CM | POA: Diagnosis not present

## 2021-08-01 DIAGNOSIS — I509 Heart failure, unspecified: Secondary | ICD-10-CM | POA: Diagnosis not present

## 2021-08-01 DIAGNOSIS — Z7902 Long term (current) use of antithrombotics/antiplatelets: Secondary | ICD-10-CM | POA: Diagnosis not present

## 2021-08-01 DIAGNOSIS — Z9181 History of falling: Secondary | ICD-10-CM | POA: Diagnosis not present

## 2021-08-01 DIAGNOSIS — M80052D Age-related osteoporosis with current pathological fracture, left femur, subsequent encounter for fracture with routine healing: Secondary | ICD-10-CM | POA: Diagnosis not present

## 2021-08-01 DIAGNOSIS — R69 Illness, unspecified: Secondary | ICD-10-CM | POA: Diagnosis not present

## 2021-08-01 DIAGNOSIS — Z96642 Presence of left artificial hip joint: Secondary | ICD-10-CM | POA: Diagnosis not present

## 2021-08-01 DIAGNOSIS — I110 Hypertensive heart disease with heart failure: Secondary | ICD-10-CM | POA: Diagnosis not present

## 2021-08-06 DIAGNOSIS — I110 Hypertensive heart disease with heart failure: Secondary | ICD-10-CM | POA: Diagnosis not present

## 2021-08-06 DIAGNOSIS — Z7982 Long term (current) use of aspirin: Secondary | ICD-10-CM | POA: Diagnosis not present

## 2021-08-06 DIAGNOSIS — M80052D Age-related osteoporosis with current pathological fracture, left femur, subsequent encounter for fracture with routine healing: Secondary | ICD-10-CM | POA: Diagnosis not present

## 2021-08-06 DIAGNOSIS — R69 Illness, unspecified: Secondary | ICD-10-CM | POA: Diagnosis not present

## 2021-08-06 DIAGNOSIS — Z7902 Long term (current) use of antithrombotics/antiplatelets: Secondary | ICD-10-CM | POA: Diagnosis not present

## 2021-08-06 DIAGNOSIS — Z96642 Presence of left artificial hip joint: Secondary | ICD-10-CM | POA: Diagnosis not present

## 2021-08-06 DIAGNOSIS — Z9181 History of falling: Secondary | ICD-10-CM | POA: Diagnosis not present

## 2021-08-06 DIAGNOSIS — I509 Heart failure, unspecified: Secondary | ICD-10-CM | POA: Diagnosis not present

## 2021-08-13 DIAGNOSIS — I110 Hypertensive heart disease with heart failure: Secondary | ICD-10-CM | POA: Diagnosis not present

## 2021-08-13 DIAGNOSIS — I509 Heart failure, unspecified: Secondary | ICD-10-CM | POA: Diagnosis not present

## 2021-08-13 DIAGNOSIS — Z7902 Long term (current) use of antithrombotics/antiplatelets: Secondary | ICD-10-CM | POA: Diagnosis not present

## 2021-08-13 DIAGNOSIS — Z96642 Presence of left artificial hip joint: Secondary | ICD-10-CM | POA: Diagnosis not present

## 2021-08-13 DIAGNOSIS — M80052D Age-related osteoporosis with current pathological fracture, left femur, subsequent encounter for fracture with routine healing: Secondary | ICD-10-CM | POA: Diagnosis not present

## 2021-08-13 DIAGNOSIS — Z9181 History of falling: Secondary | ICD-10-CM | POA: Diagnosis not present

## 2021-08-13 DIAGNOSIS — Z7982 Long term (current) use of aspirin: Secondary | ICD-10-CM | POA: Diagnosis not present

## 2021-08-13 DIAGNOSIS — R69 Illness, unspecified: Secondary | ICD-10-CM | POA: Diagnosis not present

## 2021-08-17 ENCOUNTER — Encounter: Payer: Self-pay | Admitting: Cardiovascular Disease

## 2021-08-17 ENCOUNTER — Ambulatory Visit: Payer: Medicare HMO | Admitting: Cardiovascular Disease

## 2021-08-17 VITALS — BP 128/60 | HR 80 | Ht 60.0 in | Wt 136.5 lb

## 2021-08-17 DIAGNOSIS — I255 Ischemic cardiomyopathy: Secondary | ICD-10-CM

## 2021-08-17 DIAGNOSIS — E782 Mixed hyperlipidemia: Secondary | ICD-10-CM

## 2021-08-17 DIAGNOSIS — J449 Chronic obstructive pulmonary disease, unspecified: Secondary | ICD-10-CM

## 2021-08-17 DIAGNOSIS — I340 Nonrheumatic mitral (valve) insufficiency: Secondary | ICD-10-CM

## 2021-08-17 DIAGNOSIS — I10 Essential (primary) hypertension: Secondary | ICD-10-CM | POA: Diagnosis not present

## 2021-08-17 DIAGNOSIS — I5022 Chronic systolic (congestive) heart failure: Secondary | ICD-10-CM

## 2021-08-17 DIAGNOSIS — E785 Hyperlipidemia, unspecified: Secondary | ICD-10-CM | POA: Diagnosis not present

## 2021-08-17 DIAGNOSIS — I2510 Atherosclerotic heart disease of native coronary artery without angina pectoris: Secondary | ICD-10-CM | POA: Diagnosis not present

## 2021-08-17 MED ORDER — MIDODRINE HCL 2.5 MG PO TABS: 2.5000 mg | ORAL_TABLET | Freq: Three times a day (TID) | ORAL | 3 refills | Status: DC

## 2021-08-17 NOTE — Progress Notes (Signed)
Date:  08/17/2021   ID:  [name], DOB Jul 14, 1940, MRN [id number]  Patient Location:  [address] [place] [address]   Provider location:   [facility], [place] office  PCP:  McLean-Scocuzza, Nino Glow, MD  Cardiologist:  Patsy Baltimore  Chief Complaint  Patient presents with   Follow-up    "Doing well." Medications reviewed by the patient verbally.     History of Present Illness:    [name] is a 81 y.o. female  past medical history of hepatitis C,  osteoporosis,  arthritis,  chronic back pain s/p multiple fusions,  chronic UTI's,  ischemic cardiomyopathy with ejection fraction 25% on echocardiogram Mitral valve: There was moderate to severe regurgitation  November 2016, coronary artery disease with PCI to the mid RCA,  proximal LAD August 2016,  Significant medication intolerances Previously not interested in defibrillator or repeat echocardiogram EF in 06/2017, 20 to 25% LBBB who presents for routine follow-up of her cardiomyopathy, CAD  Last seen by myself in clinic March 2022  Admitted to the hospital April 2023 Closed left hip fracture, non-STEMI Fall was mechanical in nature, secondary to elevated troponins underwent catheterization  Cardiac catheterization performed April 2023 Had successful PCI to proximal LAD and to mid LAD with stent x2 placed Difficult access Recommendation for dual aspirin Plavix for at least 12 months  PT once a week No other regular exercise, very sedentary  chronic back pain, walking with a cane, walker, wheelchair legs give out, she is very sedentary  Continues to take torsemide daily Still with 1+ leg edema, high fluid intake, sitting much of the day, tries to keep the legs up  Last echo 4/23 low EF 25% to 30%  Prior lab work reviewed CR 1.29, BUN 44  EKG personally reviewed by myself on todays visit Shows normal sinus rhythm rate 80 bpm left bundle branch block   No past  medical history reviewed  echocardiogram April 2019 showed severely depressed LV function, no improvement from 2018   She is not interested in defibrillator. We have had previous discussions   Other past medical history reviewed [facility] 10/25/14 to 8/26 for new onset acute systolic CHF, ischemic cardiomyopathy, acute respiratory distress with hypoxia, and newly diagnosed CAD s/p PCI/DES to mid RCA and prox LAD.       Admitted to [facility] in August with acute respiraotry destress with hypoxia    Echo August 2016 showed EF of 20%, akinesis of the inferior, inferoseptal, distal anterior, and apical walls. There was hypokinesis elsewhere. Cavity size was mildly dilated. Wall thickness was normal. Mild aortic regurgitation. Mild to moderate mitral regurgitation. Left atrium was mildly dilated. Right ventricle systolic was was moderate to severely reduced. A small pericardial effusion was seen.     cardiac cath on 8/25 that showed ostial LAD 30%, prox LAD 90%, ostial RCA 30%, mid RCA 90%, ostial Ramus 50%.  She underwent successful PCI/DES to the proximal LAD and mid RCA with Xience DES. She was started on aspirin and Brilinta.    she could not tolerated Brilinta 2/2 SOB. She was changed to Plavix.       Past Medical History:  Diagnosis Date   Anxiety    Arthritis    CAD (coronary artery disease)    a. cardiac cath 10/2014: ostial LAD 30%, prox LAD 90% s/p PCI/DES, ostial RCA 30%, mid RCA 90% s/p PCI/DES, ostial Ramus 50%   Chronic back pain  Chronic fatigue    Chronic systolic CHF (congestive heart failure) ([place])    a. 10/2014 Echo: EF 20%; b. 06/2017 Echo: EF 20-25%, diff HK, Gr2 DD, Mild AI. Mod to sev MR. Mod dil LA.   Hepatitis C    Ischemic cardiomyopathy    a. 10/2014 Echo: EF 20%; b. 06/2017 Echo: EF 20-25%.   LBBB (left bundle branch block)    Moderate to Severe Mitral regurgitation    a. 06/2017 Echo: Mod-Sev MR.   Osteoporosis    UTI (lower urinary tract infection)    Past Surgical  History:  Procedure Laterality Date   BACK SURGERY  07/14   T10, L1   CARDIAC CATHETERIZATION Bilateral 10/27/2014   Procedure: Left Heart Cath and Coronary Angiography;  Surgeon: Minna Merritts, MD;  Location: [place] [facility];  Service: Cardiovascular;  Laterality: Bilateral;   CARDIAC CATHETERIZATION N/A 10/27/2014   Procedure: Coronary Stent Intervention;  Surgeon: Wellington Hampshire, MD;  Location: [place] [facility];  Service: Cardiovascular;  Laterality: N/A;   CORONARY STENT INTERVENTION N/A 06/13/2021   Procedure: CORONARY STENT INTERVENTION;  Surgeon: Nelva Bush, MD;  Location: [place] [facility];  Service: Cardiovascular;  Laterality: N/A;   HIP ARTHROPLASTY Left 06/07/2021   Procedure: ARTHROPLASTY BIPOLAR HIP (HEMIARTHROPLASTY);  Surgeon: Leim Fabry, MD;  Location: [facility] OR[facility];  Service: Orthopedics;  Laterality: Left;   INTRAVASCULAR PRESSURE WIRE/FFR STUDY N/A 06/13/2021   Procedure: INTRAVASCULAR PRESSURE WIRE/FFR STUDY;  Surgeon: Nelva Bush, MD;  Location: [place] [facility];  Service: Cardiovascular;  Laterality: N/A;   RIGHT/LEFT HEART CATH AND CORONARY ANGIOGRAPHY N/A 06/13/2021   Procedure: RIGHT/LEFT HEART CATH AND CORONARY ANGIOGRAPHY;  Surgeon: Nelva Bush, MD;  Location: [place] [facility];  Service: Cardiovascular;  Laterality: N/A;     Current Meds  Medication Sig   ALPRAZolam (XANAX) 0.25 MG tablet TAKE ONE TABLET BY MOUTH ONCE TO TWICE DAILY AS NEEDED   aspirin 81 MG chewable tablet Chew 1 tablet (81 mg total) by mouth daily.   Calcium-Vitamin A-Vitamin D (LIQUID CALCIUM PO) Take by mouth. 1 tablespoon daily   citalopram (CELEXA) 10 MG tablet Take 1 tablet (10 mg total) by mouth daily.   clopidogrel (PLAVIX) 75 MG tablet Take 1 tablet (75 mg total) by mouth daily.   denosumab (PROLIA) 60 MG/ML SOSY injection Inject 60 mg into the skin every 6 (six) months.   ipratropium (ATROVENT) 0.06 % nasal spray Place 2 sprays into both nostrils 2 (two)  times daily as needed for rhinitis.   metoprolol succinate (TOPROL-XL) 25 MG 24 hr tablet Take 0.5 tablets (12.5 mg total) by mouth daily.   midodrine (PROAMATINE) 5 MG tablet Take 1 tablet (5 mg total) by mouth 3 (three) times daily with meals.   ondansetron (ZOFRAN-ODT) 4 MG disintegrating tablet TAKE 1 TABLET BY MOUTH TWICE A DAY AS NEEDED FOR NAUSEA AND VOMITING appt further refills no exceptions   oxyCODONE (OXY IR/ROXICODONE) 5 MG immediate release tablet Take 1-2 tablets (5-10 mg total) by mouth every 4 (four) hours as needed for moderate pain (pain score 4-6).   polyethylene glycol (MIRALAX / GLYCOLAX) 17 g packet Take 17 g by mouth daily as needed.   rosuvastatin (CRESTOR) 40 MG tablet TAKE ONE TABLET BY MOUTH ONE TIME DAILY AT 6PM   torsemide (DEMADEX) 20 MG tablet TAKE ONE TABLET BY MOUTH ONE TIME DAILY, EXCEPT DO NOT TAKE ON SUNDAYS   traMADol (ULTRAM) 50 MG tablet Take 1 tablet (50 mg total)  by mouth every 6 (six) hours as needed for moderate pain.     Allergies:   Sugar-protein-starch   Social History   Tobacco Use   Smoking status: Former    Packs/day: 1.00    Years: 10.00    Total pack years: 10.00    Types: Cigarettes   Smokeless tobacco: Never  Vaping Use   Vaping Use: Never used  Substance Use Topics   Alcohol use: No    Alcohol/week: 0.0 standard drinks of alcohol   Drug use: No      Family Hx: The patient's family history includes Arthritis in her mother; Diabetes in her father; Heart disease in her father.  ROS:   Please see the history of present illness.    Review of Systems  Constitutional: Negative.   HENT: Negative.    Respiratory: Negative.    Cardiovascular:  Positive for leg swelling.  Gastrointestinal: Negative.   Musculoskeletal: Negative.        Leg weakness  Neurological: Negative.   Psychiatric/Behavioral: Negative.    All other systems reviewed and are negative.    Labs/Other Tests and Data Reviewed:    Recent Labs: 06/06/2021: B  Natriuretic Peptide 1,590.0 06/15/2021: ALT 28; BUN 17; Creatinine, Ser 1.10; Hemoglobin 10.9; Magnesium 2.3; Platelets 347; Potassium 3.3; Sodium 137   Recent Lipid Panel Lab Results  Component Value Date/Time   CHOL 176 05/08/2021 03:25 PM   TRIG 285 (H) 05/08/2021 03:25 PM   HDL 48 05/08/2021 03:25 PM   CHOLHDL 3.7 05/08/2021 03:25 PM   CHOLHDL 3 01/05/2019 10:52 AM   LDLCALC 81 05/08/2021 03:25 PM   LDLDIRECT 52.0 01/05/2019 10:52 AM    Wt Readings from Last 3 Encounters:  08/17/21 136 lb 8 oz (61.9 kg)  06/05/21 150 lb (68 kg)  05/08/21 147 lb 2 oz (66.7 kg)     Exam:    Vital Signs: Vital signs may also be detailed in the HPI BP 128/60 (BP Location: Left Arm, Patient Position: Sitting, Cuff Size: Normal)   Pulse 80   Ht 5' (1.524 m)   Wt 136 lb 8 oz (61.9 kg)   SpO2 98%   BMI 26.66 kg/m   Constitutional:  oriented to person, place, and time. No distress.  HENT:  Head: Grossly normal Eyes:  no discharge. No scleral icterus.  Neck: No JVD, no carotid bruits  Cardiovascular: Regular rate and rhythm, no murmurs appreciated Trace lower extremity edema worse on the left than the right Pulmonary/Chest: Clear to auscultation bilaterally, no wheezes or rails Abdominal: Soft.  no distension.  no tenderness.  Musculoskeletal: Normal range of motion Neurological:  normal muscle tone. Coordination normal. No atrophy Skin: Skin warm and dry Psychiatric: normal affect, pleasant   ASSESSMENT & PLAN:    Coronary artery disease of native artery of native heart with stable angina pectoris (HCC) -  Recent stent x2 to proximal and mid LAD on aspirin Plavix statin beta-blocker  Ischemic cardiomyopathy -chronic systolic CHF EF 20 to 18% Previously declined ICD Previous reluctance to start other medications such as Entresto (limited given hypotension/orthostasis history) On midodrine, will recommend she wean down to 2.5 3 times daily with close monitoring of blood  pressure  Mixed hyperlipidemia -  Continue Crestor 40 daily  Essential hypertension -  History of orthostasis, recommend she decrease midodrine down to 2.5 mg 3 times daily with close monitoring of blood pressure and call us for orthostasis symptoms For any symptoms may need to go back on midodrine  up to 5 mg  Anxiety -  Managed by primary care   Total encounter time more than 30 minutes  Greater than 50% was spent in counseling and coordination of care with the patient    Signed, Ida Rogue, MD  08/17/2021 1:43 PM    [place] [facility] [address], [place], [place] [address]

## 2021-08-17 NOTE — Patient Instructions (Addendum)
Medication Instructions:  Please decrease the midodrine down to 2.5 three times a day  Extra torsemide 20 mg in the PM for worsening leg swelling  If you need a refill on your cardiac medications before your next appointment, please call your pharmacy.   Lab work: No new labs needed  Testing/Procedures: No new testing needed  Follow-Up: At [facility], you and your health needs are our priority.  As part of our continuing mission to provide you with exceptional heart care, we have created designated Provider Care Teams.  These Care Teams include your primary Cardiologist (physician) and Advanced Practice Providers (APPs -  Physician Assistants and Nurse Practitioners) who all work together to provide you with the care you need, when you need it.  You will need a follow up appointment in 3 months, APP OK  Providers on your designated Care Team:   Murray Hodgkins, NP Christell Faith, PA-C Cadence Kathlen Mody, [place]  COVID-19 Vaccine Information can be found at: [email or web address] For questions related to vaccine distribution or appointments, please email [email or web address] or call 9394272669.

## 2021-08-20 DIAGNOSIS — I509 Heart failure, unspecified: Secondary | ICD-10-CM | POA: Diagnosis not present

## 2021-08-20 DIAGNOSIS — R69 Illness, unspecified: Secondary | ICD-10-CM | POA: Diagnosis not present

## 2021-08-20 DIAGNOSIS — M80052D Age-related osteoporosis with current pathological fracture, left femur, subsequent encounter for fracture with routine healing: Secondary | ICD-10-CM | POA: Diagnosis not present

## 2021-08-20 DIAGNOSIS — Z7982 Long term (current) use of aspirin: Secondary | ICD-10-CM | POA: Diagnosis not present

## 2021-08-20 DIAGNOSIS — Z9181 History of falling: Secondary | ICD-10-CM | POA: Diagnosis not present

## 2021-08-20 DIAGNOSIS — Z96642 Presence of left artificial hip joint: Secondary | ICD-10-CM | POA: Diagnosis not present

## 2021-08-20 DIAGNOSIS — Z7902 Long term (current) use of antithrombotics/antiplatelets: Secondary | ICD-10-CM | POA: Diagnosis not present

## 2021-08-20 DIAGNOSIS — I110 Hypertensive heart disease with heart failure: Secondary | ICD-10-CM | POA: Diagnosis not present

## 2021-08-23 DIAGNOSIS — L821 Other seborrheic keratosis: Secondary | ICD-10-CM | POA: Diagnosis not present

## 2021-08-23 DIAGNOSIS — L298 Other pruritus: Secondary | ICD-10-CM | POA: Diagnosis not present

## 2021-08-23 DIAGNOSIS — L570 Actinic keratosis: Secondary | ICD-10-CM | POA: Diagnosis not present

## 2021-08-27 ENCOUNTER — Other Ambulatory Visit: Payer: Self-pay | Admitting: *Deleted

## 2021-08-27 DIAGNOSIS — M80052D Age-related osteoporosis with current pathological fracture, left femur, subsequent encounter for fracture with routine healing: Secondary | ICD-10-CM | POA: Diagnosis not present

## 2021-08-27 DIAGNOSIS — Z7982 Long term (current) use of aspirin: Secondary | ICD-10-CM | POA: Diagnosis not present

## 2021-08-27 DIAGNOSIS — I110 Hypertensive heart disease with heart failure: Secondary | ICD-10-CM | POA: Diagnosis not present

## 2021-08-27 DIAGNOSIS — Z7902 Long term (current) use of antithrombotics/antiplatelets: Secondary | ICD-10-CM | POA: Diagnosis not present

## 2021-08-27 DIAGNOSIS — I509 Heart failure, unspecified: Secondary | ICD-10-CM | POA: Diagnosis not present

## 2021-08-27 DIAGNOSIS — Z9181 History of falling: Secondary | ICD-10-CM | POA: Diagnosis not present

## 2021-08-27 DIAGNOSIS — Z96642 Presence of left artificial hip joint: Secondary | ICD-10-CM | POA: Diagnosis not present

## 2021-08-27 DIAGNOSIS — R69 Illness, unspecified: Secondary | ICD-10-CM | POA: Diagnosis not present

## 2021-08-27 MED ORDER — ROSUVASTATIN CALCIUM 40 MG PO TABS: ORAL_TABLET | ORAL | 0 refills | Status: DC

## 2021-09-06 ENCOUNTER — Other Ambulatory Visit: Payer: Self-pay | Admitting: Cardiovascular Disease

## 2021-09-11 ENCOUNTER — Telehealth: Payer: Self-pay | Admitting: Internal Medicine

## 2021-09-11 ENCOUNTER — Encounter: Payer: Self-pay | Admitting: Internal Medicine

## 2021-09-11 ENCOUNTER — Telehealth (INDEPENDENT_AMBULATORY_CARE_PROVIDER_SITE_OTHER): Payer: Medicare HMO | Admitting: Internal Medicine

## 2021-09-11 VITALS — BP 120/63 | HR 90 | Temp 97.3°F | Ht 60.0 in | Wt 129.0 lb

## 2021-09-11 DIAGNOSIS — E611 Iron deficiency: Secondary | ICD-10-CM

## 2021-09-11 DIAGNOSIS — I5022 Chronic systolic (congestive) heart failure: Secondary | ICD-10-CM

## 2021-09-11 DIAGNOSIS — E876 Hypokalemia: Secondary | ICD-10-CM

## 2021-09-11 DIAGNOSIS — R232 Flushing: Secondary | ICD-10-CM

## 2021-09-11 DIAGNOSIS — R634 Abnormal weight loss: Secondary | ICD-10-CM

## 2021-09-11 DIAGNOSIS — J309 Allergic rhinitis, unspecified: Secondary | ICD-10-CM | POA: Diagnosis not present

## 2021-09-11 DIAGNOSIS — M25552 Pain in left hip: Secondary | ICD-10-CM

## 2021-09-11 DIAGNOSIS — R7303 Prediabetes: Secondary | ICD-10-CM

## 2021-09-11 MED ORDER — OXYCODONE HCL 5 MG PO TABS: 5.0000 mg | ORAL_TABLET | Freq: Two times a day (BID) | ORAL | 0 refills | Status: DC | PRN

## 2021-09-11 MED ORDER — LEVOCETIRIZINE DIHYDROCHLORIDE 5 MG PO TABS: 2.5000 mg | ORAL_TABLET | Freq: Every evening | ORAL | 3 refills | Status: DC | PRN

## 2021-09-11 NOTE — Telephone Encounter (Signed)
Called Pt to schedule 3 months follow... Pt stated that she will callback to schedule appt

## 2021-09-11 NOTE — Progress Notes (Signed)
Virtual Visit via Video Note  I connected with [name]  on 09/11/21 at  1:20 PM EDT by a video enabled telemedicine application and verified that I am speaking with the correct person using two identifiers.  Location patient: [place] Location provider:work or home office Persons participating in the virtual visit: patient, provider  I discussed the limitations and requested verbal permission for telemedicine visit. The patient expressed understanding and agreed to proceed.   HPI:  Acute telemedicine visit for : Nasal congestion x 5-6 days atrovent no relief also tried otc ns and otc allergy pills helps some no sick contacts  Left hip fracture 06/2021 and seeing [facility] 8/10 daily pain since and wants pain contract oxycodone 5 mg bid prn  Hot cold spells declines to come in for labs currently  Chronic chf off entresto since 06/2021 due to hypotension and orthostasis per cards note will see if can resume  Down 11 lbs trying ct chest ab/pelvis negative   -Pertinent past medical history: see below -Pertinent medication allergies: Allergies  Allergen Reactions   Sugar-Protein-Starch Nausea And Vomiting    Only to sugar   -COVID-19 vaccine status:  Immunization History  Administered Date(s) Administered   Fluad Quad(high Dose 65+) 12/12/2020   Influenza Split 11/05/2012, 11/16/2013   Influenza, High Dose Seasonal PF 11/20/2016, 11/12/2017, 11/19/2018   Influenza-Unspecified 11/23/2014, 11/26/2019   PFIZER Comirnaty(Gray Top)Covid-19 Tri-Sucrose Vaccine 06/12/2020   PFIZER(Purple Top)SARS-COV-2 Vaccination 03/30/2019, 04/20/2019, 12/10/2019   Pneumococcal Conjugate-13 02/06/2015   Pneumococcal Polysaccharide-23 03/04/2010, 05/14/2017     ROS: See pertinent positives and negatives per HPI.  Past Medical History:  Diagnosis Date   Anxiety    Arthritis    CAD (coronary artery disease)    a. cardiac cath 10/2014: ostial LAD 30%, prox LAD 90% s/p PCI/DES, ostial RCA 30%, mid RCA 90%  s/p PCI/DES, ostial Ramus 50%   Chronic back pain    Chronic fatigue    Chronic systolic CHF (congestive heart failure) ([place])    a. 10/2014 Echo: EF 20%; b. 06/2017 Echo: EF 20-25%, diff HK, Gr2 DD, Mild AI. Mod to sev MR. Mod dil LA.   Hepatitis C    Ischemic cardiomyopathy    a. 10/2014 Echo: EF 20%; b. 06/2017 Echo: EF 20-25%.   LBBB (left bundle branch block)    Moderate to Severe Mitral regurgitation    a. 06/2017 Echo: Mod-Sev MR.   Osteoporosis    UTI (lower urinary tract infection)     Past Surgical History:  Procedure Laterality Date   BACK SURGERY  07/14   T10, L1   CARDIAC CATHETERIZATION Bilateral 10/27/2014   Procedure: Left Heart Cath and Coronary Angiography;  Surgeon: Minna Merritts, MD;  Location: [place] [facility];  Service: Cardiovascular;  Laterality: Bilateral;   CARDIAC CATHETERIZATION N/A 10/27/2014   Procedure: Coronary Stent Intervention;  Surgeon: Wellington Hampshire, MD;  Location: [place] [facility];  Service: Cardiovascular;  Laterality: N/A;   CORONARY STENT INTERVENTION N/A 06/13/2021   Procedure: CORONARY STENT INTERVENTION;  Surgeon: Nelva Bush, MD;  Location: [place] [facility];  Service: Cardiovascular;  Laterality: N/A;   HIP ARTHROPLASTY Left 06/07/2021   Procedure: ARTHROPLASTY BIPOLAR HIP (HEMIARTHROPLASTY);  Surgeon: Leim Fabry, MD;  Location: [facility] OR[facility];  Service: Orthopedics;  Laterality: Left;   INTRAVASCULAR PRESSURE WIRE/FFR STUDY N/A 06/13/2021   Procedure: INTRAVASCULAR PRESSURE WIRE/FFR STUDY;  Surgeon: Nelva Bush, MD;  Location: [place] [facility];  Service: Cardiovascular;  Laterality: N/A;   RIGHT/LEFT  HEART CATH AND CORONARY ANGIOGRAPHY N/A 06/13/2021   Procedure: RIGHT/LEFT HEART CATH AND CORONARY ANGIOGRAPHY;  Surgeon: Nelva Bush, MD;  Location: [place] [facility];  Service: Cardiovascular;  Laterality: N/A;     Current Outpatient Medications:    ALPRAZolam (XANAX) 0.25 MG tablet, TAKE ONE TABLET BY MOUTH ONCE TO  TWICE DAILY AS NEEDED, Disp: 60 tablet, Rfl: 5   aspirin 81 MG chewable tablet, Chew 1 tablet (81 mg total) by mouth daily., Disp: , Rfl:    Calcium-Vitamin A-Vitamin D (LIQUID CALCIUM PO), Take by mouth. 1 tablespoon daily, Disp: , Rfl:    citalopram (CELEXA) 10 MG tablet, Take 1 tablet (10 mg total) by mouth daily., Disp: 90 tablet, Rfl: 3   clopidogrel (PLAVIX) 75 MG tablet, Take 1 tablet (75 mg total) by mouth daily., Disp: 90 tablet, Rfl: 1   denosumab (PROLIA) 60 MG/ML SOSY injection, Inject 60 mg into the skin every 6 (six) months., Disp: 1 mL, Rfl: 2   ipratropium (ATROVENT) 0.06 % nasal spray, Place 2 sprays into both nostrils 2 (two) times daily as needed for rhinitis., Disp: 15 mL, Rfl: 11   levocetirizine (XYZAL) 5 MG tablet, Take 0.5 tablets (2.5 mg total) by mouth at bedtime as needed for allergies., Disp: 90 tablet, Rfl: 3   metoprolol succinate (TOPROL-XL) 25 MG 24 hr tablet, Take 0.5 tablets (12.5 mg total) by mouth daily., Disp: 45 tablet, Rfl: 1   midodrine (PROAMATINE) 2.5 MG tablet, Take 1 tablet (2.5 mg total) by mouth 3 (three) times daily with meals., Disp: 270 tablet, Rfl: 3   ondansetron (ZOFRAN-ODT) 4 MG disintegrating tablet, TAKE 1 TABLET BY MOUTH TWICE A DAY AS NEEDED FOR NAUSEA AND VOMITING appt further refills no exceptions, Disp: 60 tablet, Rfl: 11   polyethylene glycol (MIRALAX / GLYCOLAX) 17 g packet, Take 17 g by mouth daily as needed., Disp: 100 each, Rfl: 3   rosuvastatin (CRESTOR) 40 MG tablet, TAKE ONE TABLET BY MOUTH ONE TIME DAILY AT 6PM, Disp: 90 tablet, Rfl: 0   torsemide (DEMADEX) 20 MG tablet, TAKE ONE TABLET BY MOUTH ONE TIME DAILY, EXCEPT DO NOT TAKE ON SUNDAYS, Disp: 90 tablet, Rfl: 0   FLUZONE HIGH-DOSE QUADRIVALENT 0.7 ML SUSY, , Disp: , Rfl:    oxyCODONE (OXY IR/ROXICODONE) 5 MG immediate release tablet, Take 1 tablet (5 mg total) by mouth 2 (two) times daily as needed for moderate pain (pain score 4-6)., Disp: 60 tablet, Rfl: 0   PFIZER COVID-19  VAC BIVALENT injection, , Disp: , Rfl:   EXAM:  VITALS per patient if applicable:  GENERAL: alert, oriented, appears well and in no acute distress  HEENT: atraumatic, conjunttiva clear, no obvious abnormalities on inspection of external nose and ears  NECK: normal movements of the head and neck  LUNGS: on inspection no signs of respiratory distress, breathing rate appears normal, no obvious gross SOB, gasping or wheezing  CV: no obvious cyanosis  MS: moves all visible extremities without noticeable abnormality  PSYCH/NEURO: pleasant and cooperative, no obvious depression or anxiety, speech and thought processing grossly intact  ASSESSMENT AND PLAN:  Discussed the following assessment and plan:  Left hip pain - Plan: oxyCODONE (OXY IR/ROXICODONE) 5 MG immediate release tablet bid prn  Mailed pain contract   Allergic rhinitis, unspecified seasonality, unspecified trigger - Plan: levocetirizine (XYZAL) 5 MG tablet  Hot flashes Rec labs pt declines will order   Chronic systolic congestive heart failure (HCC) F/u cards off entresto due to orthostasis and hypotension  Consider restart asked Dr. Rockey Situ   Weight loss Per pt trying ct ab/pelvis neg 06/2021 as cause  Could be chronic chf   HM Had flu shot utd  pna 23 updated  Had prevnar Declines Tdap  Consider shingrix in future declines covid 4/4   Consider fobt vs cologuard declines  colonoscopy 2003 declines further    mammo declines last had years ago not in the area and declines  Pap out of age window  DEXA 01/31/16 osteoporosis + consider check vit D in future try to add on today. On Prolia last shot 07/2021  Will need to disc calcium and vit D supplementation in future make sure dose is adequate  hep c neg 10/29/16   rec healthy diet and exercise   -we discussed possible serious and likely etiologies, options for evaluation and workup, limitations of telemedicine visit vs in person visit, treatment, treatment  risks and precautions. Pt is agreeable to treatment via telemedicine at this moment.     I discussed the assessment and treatment plan with the patient. The patient was provided an opportunity to ask questions and all were answered. The patient agreed with the plan and demonstrated an understanding of the instructions.    Time spent 20 min Delorise Jackson, MD

## 2021-09-26 ENCOUNTER — Other Ambulatory Visit (INDEPENDENT_AMBULATORY_CARE_PROVIDER_SITE_OTHER): Payer: Medicare HMO

## 2021-09-26 DIAGNOSIS — E611 Iron deficiency: Secondary | ICD-10-CM | POA: Diagnosis not present

## 2021-09-26 DIAGNOSIS — R7303 Prediabetes: Secondary | ICD-10-CM | POA: Diagnosis not present

## 2021-09-26 DIAGNOSIS — I5022 Chronic systolic (congestive) heart failure: Secondary | ICD-10-CM

## 2021-09-26 DIAGNOSIS — R634 Abnormal weight loss: Secondary | ICD-10-CM

## 2021-09-26 DIAGNOSIS — R232 Flushing: Secondary | ICD-10-CM | POA: Diagnosis not present

## 2021-09-26 DIAGNOSIS — E876 Hypokalemia: Secondary | ICD-10-CM | POA: Diagnosis not present

## 2021-09-26 DIAGNOSIS — Z96649 Presence of unspecified artificial hip joint: Secondary | ICD-10-CM | POA: Diagnosis not present

## 2021-09-26 LAB — CBC WITH DIFFERENTIAL/PLATELET
Basophils Absolute: 0 10*3/uL (ref 0.0–0.1)
Basophils Relative: 0.5 % (ref 0.0–3.0)
Eosinophils Absolute: 0.1 10*3/uL (ref 0.0–0.7)
Eosinophils Relative: 1.6 % (ref 0.0–5.0)
HCT: 41.7 % (ref 36.0–46.0)
Hemoglobin: 13.6 g/dL (ref 12.0–15.0)
Lymphocytes Relative: 19.7 % (ref 12.0–46.0)
Lymphs Abs: 1.3 10*3/uL (ref 0.7–4.0)
MCHC: 32.7 g/dL (ref 30.0–36.0)
MCV: 94.3 fl (ref 78.0–100.0)
Monocytes Absolute: 0.4 10*3/uL (ref 0.1–1.0)
Monocytes Relative: 5.7 % (ref 3.0–12.0)
Neutro Abs: 4.9 10*3/uL (ref 1.4–7.7)
Neutrophils Relative %: 72.5 % (ref 43.0–77.0)
Platelets: 203 10*3/uL (ref 150.0–400.0)
RBC: 4.42 Mil/uL (ref 3.87–5.11)
RDW: 15.2 % (ref 11.5–15.5)
WBC: 6.7 10*3/uL (ref 4.0–10.5)

## 2021-09-26 LAB — COMPREHENSIVE METABOLIC PANEL
ALT: 11 U/L (ref 0–35)
AST: 18 U/L (ref 0–37)
Albumin: 4.5 g/dL (ref 3.5–5.2)
Alkaline Phosphatase: 55 U/L (ref 39–117)
BUN: 28 mg/dL — ABNORMAL HIGH (ref 6–23)
CO2: 30 mEq/L (ref 19–32)
Calcium: 10.7 mg/dL — ABNORMAL HIGH (ref 8.4–10.5)
Chloride: 104 mEq/L (ref 96–112)
Creatinine, Ser: 1.35 mg/dL — ABNORMAL HIGH (ref 0.40–1.20)
GFR: 37.25 mL/min — ABNORMAL LOW (ref >60.00)
Glucose, Bld: 99 mg/dL (ref 70–99)
Potassium: 4.2 mEq/L (ref 3.5–5.1)
Sodium: 141 mEq/L (ref 135–145)
Total Bilirubin: 0.5 mg/dL (ref 0.2–1.2)
Total Protein: 7.5 g/dL (ref 6.0–8.3)

## 2021-09-26 LAB — HEMOGLOBIN A1C: Hgb A1c MFr Bld: 6.1 % (ref 4.6–6.5)

## 2021-09-26 LAB — IBC + FERRITIN
Ferritin: 28.2 ng/mL (ref 10.0–291.0)
Iron: 59 ug/dL (ref 42–145)
Saturation Ratios: 14.3 % — ABNORMAL LOW (ref 20.0–50.0)
TIBC: 413 ug/dL (ref 250.0–450.0)
Transferrin: 295 mg/dL (ref 212.0–360.0)

## 2021-09-26 LAB — MAGNESIUM: Magnesium: 1.9 mg/dL (ref 1.5–2.5)

## 2021-09-26 LAB — TSH: TSH: 1.37 u[IU]/mL (ref 0.35–5.50)

## 2021-10-04 ENCOUNTER — Other Ambulatory Visit: Payer: Self-pay | Admitting: Internal Medicine

## 2021-10-04 ENCOUNTER — Telehealth: Payer: Self-pay

## 2021-10-04 DIAGNOSIS — J309 Allergic rhinitis, unspecified: Secondary | ICD-10-CM

## 2021-10-04 MED ORDER — IPRATROPIUM BROMIDE 0.06 % NA SOLN
2.0000 | Freq: Four times a day (QID) | NASAL | 11 refills | Status: DC
Start: 2021-10-04 — End: 2022-04-23

## 2021-10-04 NOTE — Telephone Encounter (Signed)
She can take 1/2 pill xyzal with atrovent and increase to 3x to 4 x per day

## 2021-10-04 NOTE — Telephone Encounter (Signed)
Patient states ipratropium (ATROVENT) 0.06 % nasal spray is no longer working for her and she would like to know if there is something else she can use.  *Patient states her preferred pharmacy is [facility] in [place].

## 2021-10-04 NOTE — Telephone Encounter (Signed)
There is nothing stronger  Does she want to see ENT?

## 2021-11-06 DIAGNOSIS — R0600 Dyspnea, unspecified: Secondary | ICD-10-CM | POA: Diagnosis not present

## 2021-11-06 DIAGNOSIS — R0602 Shortness of breath: Secondary | ICD-10-CM | POA: Diagnosis not present

## 2021-11-09 ENCOUNTER — Ambulatory Visit: Payer: Medicare HMO | Admitting: Cardiovascular Disease

## 2021-11-11 ENCOUNTER — Ambulatory Visit
Admission: EM | Admit: 2021-11-11 | Discharge: 2021-11-11 | Disposition: A | Discharge status: Home / Self Care | Payer: Medicare HMO

## 2021-11-11 NOTE — ED Notes (Signed)
Patient is being discharged from the Urgent Care and sent to the Emergency Department via personal vehicle . Per Hall Busing, NP, patient is in need of higher level of care due to respiratory distress. Patient is aware and verbalizes understanding of plan of care.  Vitals:   11/11/21 1145 11/11/21 1147  BP:    Pulse:  (S) (!) 105  Resp:  (S) (!) 32  SpO2: 93% 93%

## 2021-11-11 NOTE — ED Triage Notes (Signed)
Patient presents to UC for SOB. Husband states worse over the past week. Pt reports she has had SOB since April post hip surgery and has not gotten any better. Provider was called to exam room, vitals obtained, and patient was instructed to go to the ED. Declined EMS transport.

## 2021-11-12 ENCOUNTER — Other Ambulatory Visit: Payer: Self-pay | Admitting: Pulmonary Disease

## 2021-11-12 ENCOUNTER — Telehealth: Payer: Self-pay

## 2021-11-12 ENCOUNTER — Ambulatory Visit
Admission: RE | Admit: 2021-11-12 | Discharge: 2021-11-12 | Disposition: A | Discharge status: Home / Self Care | Payer: Medicare HMO | Source: Ambulatory Visit | Attending: Pulmonary Disease | Admitting: Pulmonary Disease

## 2021-11-12 DIAGNOSIS — R0602 Shortness of breath: Secondary | ICD-10-CM | POA: Insufficient documentation

## 2021-11-12 DIAGNOSIS — J441 Chronic obstructive pulmonary disease with (acute) exacerbation: Secondary | ICD-10-CM | POA: Diagnosis not present

## 2021-11-12 NOTE — Telephone Encounter (Signed)
FYI patient called very SOB & labored in breathing she wanted to be seen today. She had been to [facility] [place] advised that she needed to proceed over to ED. Patient refused. I urged patient to do the same & let her know that all we could do here was place her on oxygen until we could get EMS to come pick her up to take her to ED. Pt stated to me that, that was not happening & she was not going. I asked if she had anyone that could take her & she stated that she did, but again that was not going to be happening. Pt thanked me & hung up the phone.

## 2021-11-12 NOTE — Telephone Encounter (Signed)
Agree rec pt call 911/EMS if labored breathing to be evaluated in the ED

## 2021-11-12 NOTE — Telephone Encounter (Signed)
Pt called office requesting a new pt appt, due to she was having trouble breathing, I advised pt that she may need to go to the hospital and she stated that she was not going there and hung up.  I called back to her husbands number and spoke to him and informed him of her hanging up on me and he advised that pt was going in a few minutes to get a shot from the pulmonologist she had seen on 11/06/21.  I spoke my concern and informed husband that if she needs a new pt appt that we can get her scheduled but she isn't able to get a new pt appt today and also told husband that if she had problems at [facility] that there was [facility], [facility] and [facility] that pt can be taken to.

## 2021-11-12 NOTE — Telephone Encounter (Signed)
FYI I spoke with patient and she was in Dr. Mickey Farber office her pulmonologist.

## 2021-11-12 NOTE — Telephone Encounter (Signed)
noted 

## 2021-11-13 ENCOUNTER — Encounter
Admission: RE | Admit: 2021-11-13 | Discharge: 2021-11-13 | Disposition: A | Discharge status: Home / Self Care | Payer: Medicare HMO | Source: Ambulatory Visit | Attending: Pulmonary Disease | Admitting: Pulmonary Disease

## 2021-11-13 ENCOUNTER — Ambulatory Visit
Admission: RE | Admit: 2021-11-13 | Discharge: 2021-11-13 | Disposition: A | Discharge status: Home / Self Care | Payer: Medicare HMO | Source: Ambulatory Visit | Attending: Pulmonary Disease | Admitting: Pulmonary Disease

## 2021-11-13 DIAGNOSIS — R0602 Shortness of breath: Secondary | ICD-10-CM

## 2021-11-19 NOTE — Progress Notes (Unsigned)
Date:  11/20/2021   ID:  [name], DOB 1941/01/06, MRN [id number]  Patient Location:  [address] [place] [id number]   Provider location:   [facility], [place] office  PCP:  McLean-Scocuzza, Nino Glow, MD  Cardiologist:  Patsy Baltimore  Chief Complaint  Patient presents with   3 month follow up     Patient c/o having a hoarse voice since the hospital discharge in April and has shortness of breath. Medications reviewed by the patient verbally.     History of Present Illness:    [name] is a 81 y.o. female  past medical history of hepatitis C,  osteoporosis,  arthritis,  chronic back pain s/p multiple fusions,  chronic UTI's,  ischemic cardiomyopathy with ejection fraction 25% on echocardiogram Mitral valve: There was moderate to severe regurgitation November 2016, coronary artery disease with PCI to the mid RCA,  proximal LAD August 2016,  Significant medication intolerances Previously not interested in defibrillator or repeat echocardiogram EF in 06/2017, 20 to 25% LBBB PCI of LAD prox and mid 06/2021 who presents for routine follow-up of her cardiomyopathy, CAD  Last seen by myself in clinic June 2023 Reports that she had a fall April 2023, suffered injury to her hip Elevated cardiac enzymes Hip surgery April 2023, hoarse since then  Cardiac cath with PCI x2 Successful PCI to proximal LAD using Onyx Frontier 3.0 x 12 mm drug-eluting stent (jailing D1) with 0% residual stenosis and TIMI-3 flow. Successful PCI to mid LAD using Onyx Frontier 2.5 x 15 mm drug-eluting stent (jailing D2) with angioplasty of ostium of D2.  There is 0% residual stenosis in the LAD and 80% residual stenosis at the ostium of D2 with TIMI-3 flow.  Echo 4/23 reviewed  1. Left ventricular ejection fraction, by estimation, is 25 to 30%. The  left ventricle has severely decreased function. The left ventricle  demonstrates global hypokinesis. The left  ventricular internal cavity size  was mildly dilated. There is moderate  asymmetric left ventricular hypertrophy of the septal segment. Left  ventricular diastolic parameters are consistent with Grade II diastolic  dysfunction (pseudonormalization).   2. Right ventricular systolic function is low normal. The right  ventricular size is mildly enlarged.   3. Left atrial size was moderately dilated.   4. The mitral valve is normal in structure. Moderate mitral valve  regurgitation.   5. The aortic valve is tricuspid. Aortic valve regurgitation is trivial.   Seen in urgent care November 11, 2021 increasing "shortness of breath" since hip surgery April 2023.  She denies having shortness of breath, reports it was hoarseness It was recommended she go to the emergency room She refused follow-up in the emergency room  Reports persistent hoarseness since that time waxing and waning Schedule to see ENT next week Very debilitated, unable to move without assistance, uses a walker, husband helps her High fall risk Try humidifier in the bedroom for 1 week, did not help her voice  Denies significant shortness of breath, no significant leg swelling or abdominal distention That she takes torsemide 20 mg daily  Recent chest x-ray through urgent care showing small bilateral pleural effusions, unable to exclude pulmonary edema  Denies chest pain, feels the PCI may have helped her symptoms Still feels weak  Followed by pulmonary, Stopped trelegy,  Started prednsinone, was itching, stopped prednisone Not on Entresto, she is taking low-dose midodrine 3 times daily for hypotension  EKG personally reviewed  by myself on todays visit Shows normal sinus rhythm rate 92 bpm left bundle branch block  EKG unchanged from June and April 2023  Other past medical history reviewed She is not interested in defibrillator.    cardiac cath on 8/25 that showed ostial LAD 30%, prox LAD 90%, ostial RCA 30%, mid RCA  90%, ostial Ramus 50%.  She underwent successful PCI/DES to the proximal LAD and mid RCA with Xience DES. She was started on aspirin and Brilinta.    she could not tolerated Brilinta 2/2 SOB. She was changed to Plavix.       Past Medical History:  Diagnosis Date   Anxiety    Arthritis    CAD (coronary artery disease)    a. cardiac cath 10/2014: ostial LAD 30%, prox LAD 90% s/p PCI/DES, ostial RCA 30%, mid RCA 90% s/p PCI/DES, ostial Ramus 50%   Chronic back pain    Chronic fatigue    Chronic systolic CHF (congestive heart failure) ([place])    a. 10/2014 Echo: EF 20%; b. 06/2017 Echo: EF 20-25%, diff HK, Gr2 DD, Mild AI. Mod to sev MR. Mod dil LA.   Hepatitis C    Ischemic cardiomyopathy    a. 10/2014 Echo: EF 20%; b. 06/2017 Echo: EF 20-25%.   LBBB (left bundle branch block)    Moderate to Severe Mitral regurgitation    a. 06/2017 Echo: Mod-Sev MR.   Osteoporosis    UTI (lower urinary tract infection)    Past Surgical History:  Procedure Laterality Date   BACK SURGERY  07/14   T10, L1   CARDIAC CATHETERIZATION Bilateral 10/27/2014   Procedure: Left Heart Cath and Coronary Angiography;  Surgeon: Minna Merritts, MD;  Location: [place] [facility];  Service: Cardiovascular;  Laterality: Bilateral;   CARDIAC CATHETERIZATION N/A 10/27/2014   Procedure: Coronary Stent Intervention;  Surgeon: Wellington Hampshire, MD;  Location: [place] [facility];  Service: Cardiovascular;  Laterality: N/A;   CORONARY STENT INTERVENTION N/A 06/13/2021   Procedure: CORONARY STENT INTERVENTION;  Surgeon: Nelva Bush, MD;  Location: [place] [facility];  Service: Cardiovascular;  Laterality: N/A;   HIP ARTHROPLASTY Left 06/07/2021   Procedure: ARTHROPLASTY BIPOLAR HIP (HEMIARTHROPLASTY);  Surgeon: Leim Fabry, MD;  Location: [facility];  Service: Orthopedics;  Laterality: Left;   INTRAVASCULAR PRESSURE WIRE/FFR STUDY N/A 06/13/2021   Procedure: INTRAVASCULAR PRESSURE WIRE/FFR STUDY;  Surgeon: Nelva Bush,  MD;  Location: [place] [facility];  Service: Cardiovascular;  Laterality: N/A;   RIGHT/LEFT HEART CATH AND CORONARY ANGIOGRAPHY N/A 06/13/2021   Procedure: RIGHT/LEFT HEART CATH AND CORONARY ANGIOGRAPHY;  Surgeon: Nelva Bush, MD;  Location: [place] [facility];  Service: Cardiovascular;  Laterality: N/A;     Current Meds  Medication Sig   ALPRAZolam (XANAX) 0.25 MG tablet TAKE ONE TABLET BY MOUTH ONCE TO TWICE DAILY AS NEEDED   aspirin 81 MG chewable tablet Chew 1 tablet (81 mg total) by mouth daily.   Calcium-Vitamin A-Vitamin D (LIQUID CALCIUM PO) Take by mouth. 1 tablespoon daily   citalopram (CELEXA) 10 MG tablet Take 1 tablet (10 mg total) by mouth daily.   clopidogrel (PLAVIX) 75 MG tablet Take 1 tablet (75 mg total) by mouth daily.   denosumab (PROLIA) 60 MG/ML SOSY injection Inject 60 mg into the skin every 6 (six) months.   FLUZONE HIGH-DOSE QUADRIVALENT 0.7 ML SUSY    ipratropium (ATROVENT) 0.06 % nasal spray Place 2 sprays into both nostrils 4 (four) times daily.   levocetirizine (XYZAL)  5 MG tablet Take 0.5 tablets (2.5 mg total) by mouth at bedtime as needed for allergies.   metoprolol succinate (TOPROL-XL) 25 MG 24 hr tablet Take 0.5 tablets (12.5 mg total) by mouth daily.   midodrine (PROAMATINE) 2.5 MG tablet Take 1 tablet (2.5 mg total) by mouth 3 (three) times daily with meals.   ondansetron (ZOFRAN-ODT) 4 MG disintegrating tablet TAKE 1 TABLET BY MOUTH TWICE A DAY AS NEEDED FOR NAUSEA AND VOMITING appt further refills no exceptions   oxyCODONE (OXY IR/ROXICODONE) 5 MG immediate release tablet Take 1 tablet (5 mg total) by mouth 2 (two) times daily as needed for moderate pain (pain score 4-6).   polyethylene glycol (MIRALAX / GLYCOLAX) 17 g packet Take 17 g by mouth daily as needed.   rosuvastatin (CRESTOR) 40 MG tablet TAKE ONE TABLET BY MOUTH ONE TIME DAILY AT 6PM   torsemide (DEMADEX) 20 MG tablet TAKE ONE TABLET BY MOUTH ONE TIME DAILY, EXCEPT DO NOT TAKE ON  SUNDAYS     Allergies:   Sugar-protein-starch   Social History   Tobacco Use   Smoking status: Former    Packs/day: 1.00    Years: 10.00    Total pack years: 10.00    Types: Cigarettes   Smokeless tobacco: Never  Vaping Use   Vaping Use: Never used  Substance Use Topics   Alcohol use: No    Alcohol/week: 0.0 standard drinks of alcohol   Drug use: No      Family Hx: The patient's family history includes Arthritis in her mother; Diabetes in her father; Heart disease in her father.  ROS:   Please see the history of present illness.    Review of Systems  Constitutional: Negative.   HENT: Negative.    Respiratory: Negative.    Cardiovascular:  Positive for leg swelling.  Gastrointestinal: Negative.   Musculoskeletal: Negative.        Leg weakness  Neurological: Negative.   Psychiatric/Behavioral: Negative.    All other systems reviewed and are negative.   Labs/Other Tests and Data Reviewed:    Recent Labs: 06/06/2021: B Natriuretic Peptide 1,590.0 09/26/2021: ALT 11; BUN 28; Creatinine, Ser 1.35; Hemoglobin 13.6; Magnesium 1.9; Platelets 203.0; Potassium 4.2; Sodium 141; TSH 1.37   Recent Lipid Panel Lab Results  Component Value Date/Time   CHOL 176 05/08/2021 03:25 PM   TRIG 285 (H) 05/08/2021 03:25 PM   HDL 48 05/08/2021 03:25 PM   CHOLHDL 3.7 05/08/2021 03:25 PM   CHOLHDL 3 01/05/2019 10:52 AM   LDLCALC 81 05/08/2021 03:25 PM   LDLDIRECT 52.0 01/05/2019 10:52 AM    Wt Readings from Last 3 Encounters:  11/20/21 122 lb 2 oz (55.4 kg)  09/11/21 129 lb (58.5 kg)  08/17/21 136 lb 8 oz (61.9 kg)     Exam:    Vital Signs: Vital signs may also be detailed in the HPI BP 110/60 (BP Location: Left Arm, Patient Position: Sitting, Cuff Size: Normal)   Pulse 92   Ht 5' (1.524 m)   Wt 122 lb 2 oz (55.4 kg)   SpO2 98%   BMI 23.85 kg/m   Constitutional:  oriented to person, place, and time. No distress.  In a wheelchair HENT:  Head: Grossly normal Eyes:  no  discharge. No scleral icterus.  Neck: No JVD, no carotid bruits  Cardiovascular: Regular rate and rhythm, no murmurs appreciated Pulmonary/Chest: Clear to auscultation bilaterally, no wheezes or rails Abdominal: Soft.  no distension.  no tenderness.  Musculoskeletal: Normal  range of motion Neurological:  normal muscle tone. Coordination normal. No atrophy Skin: Skin warm and dry Psychiatric: normal affect, pleasant   ASSESSMENT & PLAN:    Coronary artery disease of native artery of native heart with stable angina pectoris (HCC) -  stent x2 to proximal and mid LAD on aspirin Plavix statin beta-blocker Denies anginal symptoms, cholesterol at goal  Ischemic cardiomyopathy -chronic systolic CHF EF 32% Previously declined ICD Previous reluctance to start other medications such as Entresto (limited given hypotension/orthostasis history) On midodrine, 2.5 mg 3 times a day, denies near syncope Continue metoprolol, torsemide  Mixed hyperlipidemia -  Continue Crestor 40 daily Cholesterol at goal  Essential hypertension -  History of orthostasis, continue midodrine 2.5 mg 3 times daily For any symptoms of orthostasis may need to go back on midodrine up to 5 mg  Debility Legs weak, unable to walk very far, husband primary caretaker Recent fall April 2023 with trauma to the hip requiring surgery  Anxiety -  Managed by primary care   Total encounter time more than 30 minutes  Greater than 50% was spent in counseling and coordination of care with the patient    Signed, Ida Rogue, MD  11/20/2021 2:20 PM    [place] [facility] [address], [place], [place] [address]

## 2021-11-20 ENCOUNTER — Ambulatory Visit: Payer: Medicare HMO | Attending: Cardiovascular Disease | Admitting: Cardiovascular Disease

## 2021-11-20 ENCOUNTER — Encounter: Payer: Self-pay | Admitting: Cardiovascular Disease

## 2021-11-20 VITALS — BP 110/60 | HR 92 | Ht 60.0 in | Wt 122.1 lb

## 2021-11-20 DIAGNOSIS — I340 Nonrheumatic mitral (valve) insufficiency: Secondary | ICD-10-CM | POA: Diagnosis not present

## 2021-11-20 DIAGNOSIS — I5022 Chronic systolic (congestive) heart failure: Secondary | ICD-10-CM | POA: Diagnosis not present

## 2021-11-20 DIAGNOSIS — E785 Hyperlipidemia, unspecified: Secondary | ICD-10-CM | POA: Diagnosis not present

## 2021-11-20 DIAGNOSIS — I2510 Atherosclerotic heart disease of native coronary artery without angina pectoris: Secondary | ICD-10-CM

## 2021-11-20 DIAGNOSIS — I10 Essential (primary) hypertension: Secondary | ICD-10-CM

## 2021-11-20 DIAGNOSIS — E782 Mixed hyperlipidemia: Secondary | ICD-10-CM

## 2021-11-20 DIAGNOSIS — J449 Chronic obstructive pulmonary disease, unspecified: Secondary | ICD-10-CM

## 2021-11-20 DIAGNOSIS — I255 Ischemic cardiomyopathy: Secondary | ICD-10-CM | POA: Diagnosis not present

## 2021-11-20 NOTE — Patient Instructions (Signed)
Medication Instructions:  No changes  If you need a refill on your cardiac medications before your next appointment, please call your pharmacy.   Lab work: No new labs needed  Testing/Procedures: No new testing needed  Follow-Up: At [facility], you and your health needs are our priority.  As part of our continuing mission to provide you with exceptional heart care, we have created designated Provider Care Teams.  These Care Teams include your primary Cardiologist (physician) and Advanced Practice Providers (APPs -  Physician Assistants and Nurse Practitioners) who all work together to provide you with the care you need, when you need it.  You will need a follow up appointment in 3 months, APP ok  Providers on your designated Care Team:   Murray Hodgkins, NP Christell Faith, PA-C Cadence Kathlen Mody, [place]  COVID-19 Vaccine Information can be found at: [email or web address] For questions related to vaccine distribution or appointments, please email [email or web address] or call [phone].

## 2021-11-26 ENCOUNTER — Encounter: Payer: Self-pay | Admitting: Internal Medicine

## 2021-11-26 DIAGNOSIS — I700 Atherosclerosis of aorta: Secondary | ICD-10-CM | POA: Insufficient documentation

## 2021-11-26 DIAGNOSIS — J3801 Paralysis of vocal cords and larynx, unilateral: Secondary | ICD-10-CM | POA: Diagnosis not present

## 2021-11-27 ENCOUNTER — Ambulatory Visit: Payer: Medicare HMO | Admitting: Internal Medicine

## 2021-12-05 ENCOUNTER — Other Ambulatory Visit: Payer: Self-pay | Admitting: Cardiovascular Disease

## 2021-12-05 NOTE — Telephone Encounter (Signed)
Refills to pharmacy 

## 2021-12-12 ENCOUNTER — Encounter: Payer: Self-pay | Admitting: Internal Medicine

## 2021-12-13 DIAGNOSIS — Z9181 History of falling: Secondary | ICD-10-CM | POA: Insufficient documentation

## 2021-12-13 DIAGNOSIS — R5381 Other malaise: Secondary | ICD-10-CM | POA: Insufficient documentation

## 2021-12-13 NOTE — Progress Notes (Addendum)
Chief Complaint  Patient presents with   Follow-up   F/u mobility evaluation with decline in health and physical deconditioning at risk for falls and multiple chronic health issues  Baseline uses rollator but requests scooter she is mentally and physically capable of using an electric scooter and home accomodates this and husband is supportive and in the home.  SHe has h/o chronic back pain, systolic CHF. Her activities are limited and she has reduced ADLS/IADLS due to chronic medical conditions and decline in health with h/o having falls with the cane/walker (rollator) and needs an electric scooter for stability which she is able to use in the house. She lives at home with her husband  Shefeels like an Transport planner will be better for her and currently concerned with her chronic medical conditions, strength and decline in health and an electric scooter will help with mobility.    Review of Systems  Constitutional:  Negative for weight loss.  HENT:  Negative for hearing loss.   Eyes:  Negative for blurred vision.  Respiratory:  Negative for shortness of breath.   Cardiovascular:  Negative for chest pain.  Gastrointestinal:  Negative for abdominal pain and blood in stool.  Genitourinary:  Negative for dysuria.  Musculoskeletal:  Positive for back pain, falls and joint pain.  Skin:  Negative for rash.  Neurological:  Positive for weakness. Negative for headaches.  Psychiatric/Behavioral:  Negative for depression. The patient is nervous/anxious.    Past Medical History:  Diagnosis Date   Anxiety    Arthritis    CAD (coronary artery disease)    a. cardiac cath 10/2014: ostial LAD 30%, prox LAD 90% s/p PCI/DES, ostial RCA 30%, mid RCA 90% s/p PCI/DES, ostial Ramus 50%   Chronic back pain    Chronic fatigue    Chronic systolic CHF (congestive heart failure) ([place])    a. 10/2014 Echo: EF 20%; b. 06/2017 Echo: EF 20-25%, diff HK, Gr2 DD, Mild AI. Mod to sev MR. Mod dil LA.   Hepatitis C     Ischemic cardiomyopathy    a. 10/2014 Echo: EF 20%; b. 06/2017 Echo: EF 20-25%.   LBBB (left bundle branch block)    Moderate to Severe Mitral regurgitation    a. 06/2017 Echo: Mod-Sev MR.   Osteoporosis    UTI (lower urinary tract infection)    Past Surgical History:  Procedure Laterality Date   BACK SURGERY  07/14   T10, L1   CARDIAC CATHETERIZATION Bilateral 10/27/2014   Procedure: Left Heart Cath and Coronary Angiography;  Surgeon: Minna Merritts, MD;  Location: [facility];  Service: Cardiovascular;  Laterality: Bilateral;   CARDIAC CATHETERIZATION N/A 10/27/2014   Procedure: Coronary Stent Intervention;  Surgeon: Wellington Hampshire, MD;  Location: [facility];  Service: Cardiovascular;  Laterality: N/A;   CORONARY STENT INTERVENTION N/A 06/13/2021   Procedure: CORONARY STENT INTERVENTION;  Surgeon: Nelva Bush, MD;  Location: [facility];  Service: Cardiovascular;  Laterality: N/A;   HIP ARTHROPLASTY Left 06/07/2021   Procedure: ARTHROPLASTY BIPOLAR HIP (HEMIARTHROPLASTY);  Surgeon: Leim Fabry, MD;  Location: [facility];  Service: Orthopedics;  Laterality: Left;   INTRAVASCULAR PRESSURE WIRE/FFR STUDY N/A 06/13/2021   Procedure: INTRAVASCULAR PRESSURE WIRE/FFR STUDY;  Surgeon: Nelva Bush, MD;  Location: [facility];  Service: Cardiovascular;  Laterality: N/A;   RIGHT/LEFT HEART CATH AND CORONARY ANGIOGRAPHY N/A 06/13/2021   Procedure: RIGHT/LEFT HEART CATH AND CORONARY ANGIOGRAPHY;  Surgeon: Nelva Bush, MD;  Location: [facility];  Service: Cardiovascular;  Laterality: N/A;   Family History  Problem Relation Age of Onset   Arthritis Mother    Heart disease Father        passed at 52   Diabetes Father    Social History   Socioeconomic History   Marital status: Married    Spouse name: Loann Chahal   Number of children: 0   Years of education: 14   Highest education level: Not on file  Occupational History   Occupation:  Receptionist    Comment: Retired in [place] Use   Smoking status: Former    Packs/day: 1.00    Years: 10.00    Total pack years: 10.00    Types: Cigarettes   Smokeless tobacco: Never  Vaping Use   Vaping Use: Never used  Substance and Sexual Activity   Alcohol use: No    Alcohol/week: 0.0 standard drinks of alcohol   Drug use: No   Sexual activity: Yes    Birth control/protection: Post-menopausal  Other Topics Concern   Not on file  Social History Narrative   Pt is 81 yo female, married to husband of 21 years.  Pt has no children.  Pt lives with husband and teacup poodle, [name]. 81 y.o as of 01/01/19.  Pt states she hurt her back last year and has not been able to participate in much physical activity since. Pt enjoys traveling with her husband in motor home, doing crossword puzzles with husband, and playing cards with friends.  Pt was raised in [facility] and has lived in [place] entire adult life.  Pt worked in [employer] in [place] until [place] when they closed.     Social Determinants of Health   Financial Resource Strain: Low Risk  (03/01/2021)   Overall Financial Resource Strain (CARDIA)    Difficulty of Paying Living Expenses: Not hard at all  Food Insecurity: No Food Insecurity (03/01/2021)   Hunger Vital Sign    Worried About Running Out of Food in the Last Year: Never true    Ran Out of Food in the Last Year: Never true  Transportation Needs: No Transportation Needs (03/01/2021)   PRAPARE - Hydrologist (Medical): No    Lack of Transportation (Non-Medical): No  Physical Activity: Insufficiently Active (03/01/2021)   Exercise Vital Sign    Days of Exercise per Week: 5 days    Minutes of Exercise per Session: 10 min  Stress: No Stress Concern Present (03/01/2021)   [place]    Feeling of Stress : Not at all  Social Connections: Unknown (03/01/2021)   Social  Connection and Isolation Panel [NHANES]    Frequency of Communication with Friends and Family: Not on file    Frequency of Social Gatherings with Friends and Family: Not on file    Attends Religious Services: Not on file    Active Member of Clubs or Organizations: Not on file    Attends Archivist Meetings: Not on file    Marital Status: Married  Intimate Partner Violence: Not At Risk (03/01/2021)   Humiliation, Afraid, Rape, and Kick questionnaire    Fear of Current or Ex-Partner: No    Emotionally Abused: No    Physically Abused: No    Sexually Abused: No   Current Meds  Medication Sig   Calcium-Vitamin A-Vitamin D (LIQUID CALCIUM PO) Take by mouth. 1 tablespoon daily   denosumab (  PROLIA) 60 MG/ML SOSY injection Inject 60 mg into the skin every 6 (six) months.   [DISCONTINUED] citalopram (CELEXA) 10 MG tablet Take 1 tablet (10 mg total) by mouth daily.   [DISCONTINUED] clopidogrel (PLAVIX) 75 MG tablet Take 1 tablet (75 mg total) by mouth daily.   [DISCONTINUED] ipratropium (ATROVENT) 0.06 % nasal spray Place 2 sprays into both nostrils daily.   [DISCONTINUED] metoprolol succinate (TOPROL-XL) 25 MG 24 hr tablet Take 1 tablet (25 mg total) by mouth daily.   [DISCONTINUED] ondansetron (ZOFRAN-ODT) 4 MG disintegrating tablet TAKE 1 TABLET BY MOUTH TWICE A DAY AS NEEDED FOR NAUSEA AND VOMITING appt further refills no exceptions   [DISCONTINUED] rosuvastatin (CRESTOR) 40 MG tablet Take 1 tablet (40 mg total) by mouth daily at 6 PM.   [DISCONTINUED] sacubitril-valsartan (ENTRESTO) 24-26 MG Take 1 tablet by mouth 2 (two) times daily.   [DISCONTINUED] torsemide (DEMADEX) 20 MG tablet TAKE 1 TABLET (20 MG TOTAL) BY MOUTH DAILY. EXCEPT ON SUNDAYS.   Allergies  Allergen Reactions   Sugar-Protein-Starch Nausea And Vomiting    Only to sugar   Recent Results (from the past 2160 hour(s))  Hemoglobin A1c     Status: None   Collection Time: 09/26/21  1:51 PM  Result Value Ref Range    Hgb A1c MFr Bld 6.1 4.6 - 6.5 %    Comment: Glycemic Control Guidelines for People with Diabetes:Non Diabetic:  <6%Goal of Therapy: <7%Additional Action Suggested:  >8%   Magnesium     Status: None   Collection Time: 09/26/21  1:51 PM  Result Value Ref Range   Magnesium 1.9 1.5 - 2.5 mg/dL  TSH     Status: None   Collection Time: 09/26/21  1:51 PM  Result Value Ref Range   TSH 1.37 0.35 - 5.50 uIU/mL  IBC + Ferritin     Status: Abnormal   Collection Time: 09/26/21  1:51 PM  Result Value Ref Range   Iron 59 42 - 145 ug/dL   Transferrin 295.0 212.0 - 360.0 mg/dL   Saturation Ratios 14.3 (L) 20.0 - 50.0 %   Ferritin 28.2 10.0 - 291.0 ng/mL   TIBC 413.0 250.0 - 450.0 mcg/dL  CBC with Differential/Platelet     Status: None   Collection Time: 09/26/21  1:51 PM  Result Value Ref Range   WBC 6.7 4.0 - 10.5 K/uL   RBC 4.42 3.87 - 5.11 Mil/uL   Hemoglobin 13.6 12.0 - 15.0 g/dL   HCT 41.7 36.0 - 46.0 %   MCV 94.3 78.0 - 100.0 fl   MCHC 32.7 30.0 - 36.0 g/dL   RDW 15.2 11.5 - 15.5 %   Platelets 203.0 150.0 - 400.0 K/uL   Neutrophils Relative % 72.5 43.0 - 77.0 %   Lymphocytes Relative 19.7 12.0 - 46.0 %   Monocytes Relative 5.7 3.0 - 12.0 %   Eosinophils Relative 1.6 0.0 - 5.0 %   Basophils Relative 0.5 0.0 - 3.0 %   Neutro Abs 4.9 1.4 - 7.7 K/uL   Lymphs Abs 1.3 0.7 - 4.0 K/uL   Monocytes Absolute 0.4 0.1 - 1.0 K/uL   Eosinophils Absolute 0.1 0.0 - 0.7 K/uL   Basophils Absolute 0.0 0.0 - 0.1 K/uL  Comprehensive metabolic panel     Status: Abnormal   Collection Time: 09/26/21  1:51 PM  Result Value Ref Range   Sodium 141 135 - 145 mEq/L   Potassium 4.2 3.5 - 5.1 mEq/L   Chloride 104 96 - 112 mEq/L  CO2 30 19 - 32 mEq/L   Glucose, Bld 99 70 - 99 mg/dL   BUN 28 (H) 6 - 23 mg/dL   Creatinine, Ser 1.35 (H) 0.40 - 1.20 mg/dL   Total Bilirubin 0.5 0.2 - 1.2 mg/dL   Alkaline Phosphatase 55 39 - 117 U/L   AST 18 0 - 37 U/L   ALT 11 0 - 35 U/L   Total Protein 7.5 6.0 - 8.3 g/dL    Albumin 4.5 3.5 - 5.2 g/dL   GFR 37.25 (L) >60.00 mL/min    Comment: Calculated using the CKD-EPI Creatinine Equation (2021)   Calcium 10.7 (H) 8.4 - 10.5 mg/dL   Objective  Body mass index is 27.54 kg/m. Wt Readings from Last 3 Encounters:  11/20/21 122 lb 2 oz (55.4 kg)  09/11/21 129 lb (58.5 kg)  08/17/21 136 lb 8 oz (61.9 kg)   Temp Readings from Last 3 Encounters:  09/11/21 (!) 97.3 F (36.3 C) (Oral)  06/16/21 98.1 F (36.7 C) (Oral)  03/01/21 (!) 97.3 F (36.3 C)   BP Readings from Last 3 Encounters:  11/20/21 110/60  09/11/21 120/63  08/17/21 128/60   Pulse Readings from Last 3 Encounters:  11/20/21 92  09/11/21 90  08/17/21 80    Physical Exam Vitals and nursing note reviewed.  Constitutional:      Appearance: Normal appearance. She is well-developed and well-groomed.  HENT:     Head: Normocephalic and atraumatic.  Eyes:     Conjunctiva/sclera: Conjunctivae normal.     Pupils: Pupils are equal, round, and reactive to light.  Cardiovascular:     Rate and Rhythm: Normal rate and regular rhythm.     Heart sounds: Normal heart sounds. No murmur heard. Pulmonary:     Effort: Pulmonary effort is normal.     Breath sounds: Normal breath sounds.  Abdominal:     General: Abdomen is flat. Bowel sounds are normal.     Tenderness: There is no abdominal tenderness.  Musculoskeletal:        General: No tenderness.     Comments: 5/5 strength upper body b/l arms and  3+/5 strength lower legs b/l  Arthritis in joints throughout body  Skin:    General: Skin is warm and dry.  Neurological:     General: No focal deficit present.     Mental Status: She is alert and oriented to person, place, and time. Mental status is at baseline.     Motor: Weakness present.     Gait: Gait abnormal.     Comments: Baseline walks with rollator  Psychiatric:        Attention and Perception: Attention and perception normal.        Mood and Affect: Mood and affect normal.         Speech: Speech normal.        Behavior: Behavior normal. Behavior is cooperative.        Thought Content: Thought content normal.        Cognition and Memory: Cognition and memory normal.        Judgment: Judgment normal.     Assessment  Plan  Mobility exam for electric scooter with chronic health issues High falls risk &abnormal gait Baseline walks  with rollator but this is not enough due to physical deconditioning Chronic back pain CAD/HTN H/o NSTEMI COPD CKD 3B S/p left hip fracture  Osteoporosis Chronic systolic CHF (congestive heart failure) ([place])  Mobility impaired with use of cane/walker with sob  with exertion (multifactorial reasons) need for electric motorized scooter with batteries due to decline in health multifactorial reasons see above CHF, osteoporosis with history of fall and fracture, chronic pain secondary to arthritis  Impaired gait and mobility Impaired mobility and ADLs    Mobility evaluation for electric motorized scooter device with batteries due to chronic medical conditions and declining health over the last 1-2 years.  She is also physically deconditioned after fall with hip fracture 06/05/2021.   Patient has mobility limiitations which impairs her ability to perform ADLS/IADLS at home and outside of the home (I.e go to doctor appointments, get to the bathroom/kitchen/bedroom at home, check her mail).   A power mobility device and batteries are necessary. She is already using a rollator and mechanical wheelchair with difficultly due to shortness of breath with exertion, history of falls make these devices unsafe being that she is a falls risk which mahybe detrimental to her health.    A walking device will not resolve the issue and an electric scooter with batteries will allow the patient to safely perform daily activities and go to appointments. She doesn't propel herself well at home with a cane, walker/rollator, and relies on assistance from others when able  I.e her husband.     Her mental health/memory deem her safe to use the aforementioned device.  She is able to shift weight with her upper body and weaker in her bilateral lower legs for the above aforementioned reasons.    Mobility limitations impair her ability to perform ADLs/IADLS at home and outside of the home as mentioned and a power mobility device and batteries are necessary (requested device). She is already using a cane/walker/rollator with difficulty due to risk of falls, shortness of breath with exertion, decline in health and physical deconditioning.    A walking device will not resolve the issue and an electric motorized scooter will allow the patient to safely perform daily activities ADLS and IADLS.  She is limited to propel herself at home with cane and walker but this is difficult due to the above mentioned reasons.   She has balance issues with walking and at risk for falls so a cane/walker will not medically meet my patients' needs in the home.     Please approve a power mobility device (I.e motorized scooter) with batteries.    Manual wheelchair will not medically meet my patients mobility needs in the home as she is limited physically and due to shortness of breath.   A Scooter will meet patients mobility needs due to history of falls,gait/postural instability.    The patient can safely operate the power mobility device both mentally and physically and she is willing and motivated to use the power mobility device in the home.       Rx power mobility device (I.e motorized scooter) and batteries M54.1,I50.22, N18.32, R26.9, R53.81, Z91.81   Provider: Dr. Olivia Mackie McLean-[facility]

## 2021-12-14 ENCOUNTER — Telehealth: Payer: Self-pay | Admitting: Internal Medicine

## 2021-12-14 NOTE — Telephone Encounter (Signed)
Pt has been informed and it has been mailed

## 2021-12-14 NOTE — Telephone Encounter (Signed)
Filled out Rx for electric motorized scooter per pt request with batteries see note from 06/27/20 for more information   Rx given to patient see my chart message and inform patient

## 2021-12-31 ENCOUNTER — Emergency Department (HOSPITAL_COMMUNITY): Payer: Medicare HMO

## 2021-12-31 ENCOUNTER — Inpatient Hospital Stay (HOSPITAL_COMMUNITY): Payer: Medicare HMO

## 2021-12-31 ENCOUNTER — Inpatient Hospital Stay (HOSPITAL_COMMUNITY)
Admission: EM | Admit: 2021-12-31 | Discharge: 2022-01-04 | DRG: 242 | Disposition: A | Discharge status: Home / Self Care | Payer: Medicare HMO | Attending: Internal Medicine | Admitting: Internal Medicine

## 2021-12-31 ENCOUNTER — Other Ambulatory Visit: Payer: Self-pay

## 2021-12-31 ENCOUNTER — Encounter (HOSPITAL_COMMUNITY): Payer: Self-pay | Admitting: Emergency Medicine

## 2021-12-31 DIAGNOSIS — R9431 Abnormal electrocardiogram [ECG] [EKG]: Secondary | ICD-10-CM | POA: Diagnosis not present

## 2021-12-31 DIAGNOSIS — G8929 Other chronic pain: Secondary | ICD-10-CM | POA: Diagnosis not present

## 2021-12-31 DIAGNOSIS — I083 Combined rheumatic disorders of mitral, aortic and tricuspid valves: Secondary | ICD-10-CM | POA: Diagnosis present

## 2021-12-31 DIAGNOSIS — N183 Chronic kidney disease, stage 3 unspecified: Secondary | ICD-10-CM | POA: Diagnosis not present

## 2021-12-31 DIAGNOSIS — J90 Pleural effusion, not elsewhere classified: Secondary | ICD-10-CM

## 2021-12-31 DIAGNOSIS — I252 Old myocardial infarction: Secondary | ICD-10-CM

## 2021-12-31 DIAGNOSIS — Z955 Presence of coronary angioplasty implant and graft: Secondary | ICD-10-CM

## 2021-12-31 DIAGNOSIS — N184 Chronic kidney disease, stage 4 (severe): Secondary | ICD-10-CM | POA: Diagnosis present

## 2021-12-31 DIAGNOSIS — I3139 Other pericardial effusion (noninflammatory): Secondary | ICD-10-CM | POA: Diagnosis not present

## 2021-12-31 DIAGNOSIS — R0602 Shortness of breath: Secondary | ICD-10-CM | POA: Diagnosis not present

## 2021-12-31 DIAGNOSIS — R748 Abnormal levels of other serum enzymes: Secondary | ICD-10-CM | POA: Diagnosis not present

## 2021-12-31 DIAGNOSIS — I272 Pulmonary hypertension, unspecified: Secondary | ICD-10-CM | POA: Diagnosis not present

## 2021-12-31 DIAGNOSIS — N189 Chronic kidney disease, unspecified: Secondary | ICD-10-CM | POA: Diagnosis not present

## 2021-12-31 DIAGNOSIS — I493 Ventricular premature depolarization: Secondary | ICD-10-CM | POA: Diagnosis present

## 2021-12-31 DIAGNOSIS — I130 Hypertensive heart and chronic kidney disease with heart failure and stage 1 through stage 4 chronic kidney disease, or unspecified chronic kidney disease: Principal | ICD-10-CM | POA: Diagnosis present

## 2021-12-31 DIAGNOSIS — I2510 Atherosclerotic heart disease of native coronary artery without angina pectoris: Secondary | ICD-10-CM | POA: Diagnosis not present

## 2021-12-31 DIAGNOSIS — D509 Iron deficiency anemia, unspecified: Secondary | ICD-10-CM | POA: Diagnosis present

## 2021-12-31 DIAGNOSIS — I5023 Acute on chronic systolic (congestive) heart failure: Secondary | ICD-10-CM | POA: Diagnosis present

## 2021-12-31 DIAGNOSIS — E782 Mixed hyperlipidemia: Secondary | ICD-10-CM | POA: Diagnosis not present

## 2021-12-31 DIAGNOSIS — J449 Chronic obstructive pulmonary disease, unspecified: Secondary | ICD-10-CM | POA: Diagnosis not present

## 2021-12-31 DIAGNOSIS — Z950 Presence of cardiac pacemaker: Secondary | ICD-10-CM | POA: Diagnosis not present

## 2021-12-31 DIAGNOSIS — R7989 Other specified abnormal findings of blood chemistry: Secondary | ICD-10-CM | POA: Diagnosis present

## 2021-12-31 DIAGNOSIS — Z87891 Personal history of nicotine dependence: Secondary | ICD-10-CM | POA: Diagnosis not present

## 2021-12-31 DIAGNOSIS — E876 Hypokalemia: Secondary | ICD-10-CM | POA: Diagnosis present

## 2021-12-31 DIAGNOSIS — I5043 Acute on chronic combined systolic (congestive) and diastolic (congestive) heart failure: Secondary | ICD-10-CM | POA: Diagnosis not present

## 2021-12-31 DIAGNOSIS — J3800 Paralysis of vocal cords and larynx, unspecified: Secondary | ICD-10-CM | POA: Diagnosis not present

## 2021-12-31 DIAGNOSIS — I4430 Unspecified atrioventricular block: Secondary | ICD-10-CM | POA: Diagnosis present

## 2021-12-31 DIAGNOSIS — D649 Anemia, unspecified: Secondary | ICD-10-CM | POA: Diagnosis not present

## 2021-12-31 DIAGNOSIS — Z1152 Encounter for screening for COVID-19: Secondary | ICD-10-CM

## 2021-12-31 DIAGNOSIS — I509 Heart failure, unspecified: Secondary | ICD-10-CM | POA: Diagnosis not present

## 2021-12-31 DIAGNOSIS — Z8619 Personal history of other infectious and parasitic diseases: Secondary | ICD-10-CM

## 2021-12-31 DIAGNOSIS — N179 Acute kidney failure, unspecified: Secondary | ICD-10-CM | POA: Diagnosis present

## 2021-12-31 DIAGNOSIS — M810 Age-related osteoporosis without current pathological fracture: Secondary | ICD-10-CM | POA: Diagnosis present

## 2021-12-31 DIAGNOSIS — I447 Left bundle-branch block, unspecified: Secondary | ICD-10-CM | POA: Diagnosis present

## 2021-12-31 DIAGNOSIS — Z7982 Long term (current) use of aspirin: Secondary | ICD-10-CM

## 2021-12-31 DIAGNOSIS — R69 Illness, unspecified: Secondary | ICD-10-CM | POA: Diagnosis not present

## 2021-12-31 DIAGNOSIS — I959 Hypotension, unspecified: Secondary | ICD-10-CM | POA: Diagnosis present

## 2021-12-31 DIAGNOSIS — J9601 Acute respiratory failure with hypoxia: Secondary | ICD-10-CM | POA: Diagnosis not present

## 2021-12-31 DIAGNOSIS — M7989 Other specified soft tissue disorders: Secondary | ICD-10-CM | POA: Diagnosis not present

## 2021-12-31 DIAGNOSIS — R600 Localized edema: Secondary | ICD-10-CM

## 2021-12-31 DIAGNOSIS — Z79899 Other long term (current) drug therapy: Secondary | ICD-10-CM

## 2021-12-31 DIAGNOSIS — Z833 Family history of diabetes mellitus: Secondary | ICD-10-CM

## 2021-12-31 DIAGNOSIS — Z7902 Long term (current) use of antithrombotics/antiplatelets: Secondary | ICD-10-CM

## 2021-12-31 DIAGNOSIS — F1120 Opioid dependence, uncomplicated: Secondary | ICD-10-CM | POA: Diagnosis present

## 2021-12-31 DIAGNOSIS — I5042 Chronic combined systolic (congestive) and diastolic (congestive) heart failure: Secondary | ICD-10-CM | POA: Diagnosis not present

## 2021-12-31 DIAGNOSIS — R718 Other abnormality of red blood cells: Secondary | ICD-10-CM | POA: Insufficient documentation

## 2021-12-31 DIAGNOSIS — I110 Hypertensive heart disease with heart failure: Secondary | ICD-10-CM | POA: Diagnosis not present

## 2021-12-31 DIAGNOSIS — R54 Age-related physical debility: Secondary | ICD-10-CM | POA: Diagnosis not present

## 2021-12-31 DIAGNOSIS — I255 Ischemic cardiomyopathy: Secondary | ICD-10-CM | POA: Diagnosis present

## 2021-12-31 DIAGNOSIS — I25810 Atherosclerosis of coronary artery bypass graft(s) without angina pectoris: Secondary | ICD-10-CM | POA: Diagnosis not present

## 2021-12-31 DIAGNOSIS — Z96642 Presence of left artificial hip joint: Secondary | ICD-10-CM | POA: Diagnosis present

## 2021-12-31 LAB — COMPREHENSIVE METABOLIC PANEL
ALT: 26 U/L (ref 0–44)
AST: 37 U/L (ref 15–41)
Albumin: 3.9 g/dL (ref 3.5–5.0)
Alkaline Phosphatase: 62 U/L (ref 38–126)
Anion gap: 17 — ABNORMAL HIGH (ref 5–15)
BUN: 35 mg/dL — ABNORMAL HIGH (ref 8–23)
CO2: 25 mmol/L (ref 22–32)
Calcium: 9.6 mg/dL (ref 8.9–10.3)
Chloride: 100 mmol/L (ref 98–111)
Creatinine, Ser: 1.93 mg/dL — ABNORMAL HIGH (ref 0.44–1.00)
GFR, Estimated: 26 mL/min — ABNORMAL LOW (ref >60)
Glucose, Bld: 108 mg/dL — ABNORMAL HIGH (ref 70–99)
Potassium: 4.1 mmol/L (ref 3.5–5.1)
Sodium: 142 mmol/L (ref 135–145)
Total Bilirubin: 0.7 mg/dL (ref 0.3–1.2)
Total Protein: 6.7 g/dL (ref 6.5–8.1)

## 2021-12-31 LAB — TROPONIN I (HIGH SENSITIVITY)
Troponin I (High Sensitivity): 58 ng/L — ABNORMAL HIGH (ref <18)
Troponin I (High Sensitivity): 42 ng/L — ABNORMAL HIGH (ref <18)

## 2021-12-31 LAB — TYPE AND SCREEN
ABO/RH(D): A POS
Antibody Screen: NEGATIVE

## 2021-12-31 LAB — CBC WITH DIFFERENTIAL/PLATELET
Abs Immature Granulocytes: 0.02 10*3/uL (ref 0.00–0.07)
Basophils Absolute: 0.1 10*3/uL (ref 0.0–0.1)
Basophils Relative: 1 %
Eosinophils Absolute: 0.1 10*3/uL (ref 0.0–0.5)
Eosinophils Relative: 1 %
HCT: 46.3 % — ABNORMAL HIGH (ref 36.0–46.0)
Hemoglobin: 14.9 g/dL (ref 12.0–15.0)
Immature Granulocytes: 0 %
Lymphocytes Relative: 19 %
Lymphs Abs: 1.6 10*3/uL (ref 0.7–4.0)
MCH: 32.9 pg (ref 26.0–34.0)
MCHC: 32.2 g/dL (ref 30.0–36.0)
MCV: 102.2 fL — ABNORMAL HIGH (ref 80.0–100.0)
Monocytes Absolute: 0.6 10*3/uL (ref 0.1–1.0)
Monocytes Relative: 7 %
Neutro Abs: 5.7 10*3/uL (ref 1.7–7.7)
Neutrophils Relative %: 72 %
Platelets: 182 10*3/uL (ref 150–400)
RBC: 4.53 MIL/uL (ref 3.87–5.11)
RDW: 16.8 % — ABNORMAL HIGH (ref 11.5–15.5)
WBC: 8 10*3/uL (ref 4.0–10.5)
nRBC: 0.3 % — ABNORMAL HIGH (ref 0.0–0.2)

## 2021-12-31 LAB — LIPASE, BLOOD: Lipase: 52 U/L — ABNORMAL HIGH (ref 11–51)

## 2021-12-31 LAB — SARS CORONAVIRUS 2 BY RT PCR: SARS Coronavirus 2 by RT PCR: NEGATIVE

## 2021-12-31 LAB — D-DIMER, QUANTITATIVE: D-Dimer, Quant: 1.84 ug/mL-FEU — ABNORMAL HIGH (ref 0.00–0.50)

## 2021-12-31 LAB — BRAIN NATRIURETIC PEPTIDE: B Natriuretic Peptide: >4500 pg/mL — ABNORMAL HIGH (ref 0.0–100.0)

## 2021-12-31 MED ORDER — ROSUVASTATIN CALCIUM 20 MG PO TABS
40.0000 mg | ORAL_TABLET | Freq: Every day | ORAL | Status: DC
  Administered 2022-01-01 – 2022-01-04 (×4): 40 mg via ORAL
  Filled 2021-12-31 (×4): qty 2

## 2021-12-31 MED ORDER — MIDODRINE HCL 5 MG PO TABS
2.5000 mg | ORAL_TABLET | Freq: Three times a day (TID) | ORAL | Status: DC
  Administered 2022-01-01 – 2022-01-03 (×5): 2.5 mg via ORAL
  Filled 2021-12-31 (×6): qty 1

## 2021-12-31 MED ORDER — FUROSEMIDE 10 MG/ML IJ SOLN
40.0000 mg | Freq: Once | INTRAMUSCULAR | Status: AC
  Administered 2021-12-31: 40 mg via INTRAVENOUS
  Filled 2021-12-31: qty 4

## 2021-12-31 MED ORDER — HEPARIN BOLUS VIA INFUSION
3500.0000 [IU] | Freq: Once | INTRAVENOUS | Status: AC
  Administered 2021-12-31: 3500 [IU] via INTRAVENOUS
  Filled 2021-12-31: qty 3500

## 2021-12-31 MED ORDER — ACETAMINOPHEN 325 MG PO TABS
650.0000 mg | ORAL_TABLET | Freq: Four times a day (QID) | ORAL | Status: DC | PRN
  Filled 2021-12-31: qty 2

## 2021-12-31 MED ORDER — FERROUS SULFATE 325 (65 FE) MG PO TABS
325.0000 mg | ORAL_TABLET | Freq: Every day | ORAL | Status: DC
  Filled 2021-12-31 (×4): qty 1

## 2021-12-31 MED ORDER — METOPROLOL SUCCINATE ER 25 MG PO TB24
12.5000 mg | ORAL_TABLET | Freq: Every day | ORAL | Status: DC
  Administered 2022-01-01 – 2022-01-04 (×4): 12.5 mg via ORAL
  Filled 2021-12-31 (×4): qty 1

## 2021-12-31 MED ORDER — ONDANSETRON 4 MG PO TBDP
4.0000 mg | ORAL_TABLET | Freq: Once | ORAL | Status: AC
  Administered 2021-12-31: 4 mg via ORAL
  Filled 2021-12-31: qty 1

## 2021-12-31 MED ORDER — ASPIRIN 81 MG PO CHEW
81.0000 mg | CHEWABLE_TABLET | Freq: Every day | ORAL | Status: DC
  Administered 2022-01-01 – 2022-01-04 (×4): 81 mg via ORAL
  Filled 2021-12-31 (×4): qty 1

## 2021-12-31 MED ORDER — HEPARIN (PORCINE) 25000 UT/250ML-% IV SOLN
850.0000 [IU]/h | INTRAVENOUS | Status: DC
  Administered 2021-12-31: 1000 [IU]/h via INTRAVENOUS
  Filled 2021-12-31: qty 250

## 2021-12-31 MED ORDER — IPRATROPIUM BROMIDE 0.06 % NA SOLN
2.0000 | Freq: Four times a day (QID) | NASAL | Status: DC
  Administered 2021-12-31 – 2022-01-04 (×3): 2 via NASAL
  Filled 2021-12-31: qty 15

## 2021-12-31 MED ORDER — FUROSEMIDE 10 MG/ML IJ SOLN
40.0000 mg | Freq: Two times a day (BID) | INTRAMUSCULAR | Status: DC
  Administered 2021-12-31 – 2022-01-02 (×4): 40 mg via INTRAVENOUS
  Filled 2021-12-31 (×4): qty 4

## 2021-12-31 MED ORDER — ALBUTEROL SULFATE (2.5 MG/3ML) 0.083% IN NEBU: 2.5000 mg | INHALATION_SOLUTION | Freq: Four times a day (QID) | RESPIRATORY_TRACT | Status: DC | PRN

## 2021-12-31 MED ORDER — ALBUTEROL SULFATE HFA 108 (90 BASE) MCG/ACT IN AERS: 2.0000 | INHALATION_SPRAY | Freq: Four times a day (QID) | RESPIRATORY_TRACT | Status: DC | PRN

## 2021-12-31 MED ORDER — ACETAMINOPHEN 650 MG RE SUPP: 650.0000 mg | Freq: Four times a day (QID) | RECTAL | Status: DC | PRN

## 2021-12-31 NOTE — ED Notes (Signed)
Pt transported to [facility] 

## 2021-12-31 NOTE — ED Provider Notes (Signed)
[facility] EMERGENCY DEPARTMENT Provider Note   CSN: [id number] Arrival date & time: 12/31/21  4098     History  Chief Complaint  Patient presents with   Weakness   Anemia   Pruritis   Leg Swelling    [name] is a 81 y.o. female.   Weakness Anemia   Patient has history of osteoporosis, hypertension, coronary artery disease, acute on chronic CHF, COPD, chronic kidney disease, non-ST elevation myocardial infarction, left bundle branch block.  Echocardiogram in May of this year showed diminished ejection fraction with moderate mitral valve regurgitation  She presents to the ER for evaluation of weakness and shortness of breath.  Patient states she has been feeling weak for several months.  She has to use a walker as well as a scooter to help get around.  The weakness is all over including her arms and legs.  Patient states in the last few days however its become acutely worse.  She was feeling short of breath.  This brought her to the emergency room.  She has noticed leg swelling that is new.  She is not having any chest pain.  No fevers.  She has a slight cough but she attributes that to the issue with her vocal cord paralysis that started several months ago.  Patient has been receiving treatment for that  Home Medications Prior to Admission medications   Medication Sig Start Date End Date Taking? Authorizing Provider  albuterol (VENTOLIN HFA) 108 (90 Base) MCG/ACT inhaler Inhale 2 puffs into the lungs every 6 (six) hours as needed for wheezing or shortness of breath. 11/06/21 11/06/22 Yes [provider]  ALPRAZolam (XANAX) 0.25 MG tablet TAKE ONE TABLET BY MOUTH ONCE TO TWICE DAILY AS NEEDED Patient taking differently: Take 0.25 mg by mouth 2 (two) times daily as needed for anxiety. 06/25/21  Yes McLean-Scocuzza, Nino Glow, MD  aspirin 81 MG chewable tablet Chew 1 tablet (81 mg total) by mouth daily. 06/16/21  Yes Dwyane Dee, MD  citalopram (CELEXA)  10 MG tablet Take 1 tablet (10 mg total) by mouth daily. 06/25/21  Yes McLean-Scocuzza, Nino Glow, MD  denosumab (PROLIA) 60 MG/ML SOSY injection Inject 60 mg into the skin every 6 (six) months. 11/06/17  Yes McLean-Scocuzza, Nino Glow, MD  ipratropium (ATROVENT) 0.06 % nasal spray Place 2 sprays into both nostrils 4 (four) times daily. 10/04/21  Yes McLean-Scocuzza, Nino Glow, MD  metoprolol succinate (TOPROL-XL) 25 MG 24 hr tablet Take 0.5 tablets (12.5 mg total) by mouth daily. 06/18/21  Yes Gollan, Kathlene November, MD  midodrine (PROAMATINE) 2.5 MG tablet Take 1 tablet (2.5 mg total) by mouth 3 (three) times daily with meals. 08/17/21  Yes Gollan, Kathlene November, MD  ondansetron (ZOFRAN-ODT) 4 MG disintegrating tablet TAKE 1 TABLET BY MOUTH TWICE A DAY AS NEEDED FOR NAUSEA AND VOMITING appt further refills no exceptions Patient taking differently: Take 4 mg by mouth 2 (two) times daily as needed for nausea or vomiting. 06/25/21  Yes McLean-Scocuzza, Nino Glow, MD  oxyCODONE (OXY IR/ROXICODONE) 5 MG immediate release tablet Take 1 tablet (5 mg total) by mouth 2 (two) times daily as needed for moderate pain (pain score 4-6). 09/11/21  Yes McLean-Scocuzza, Nino Glow, MD  rosuvastatin (CRESTOR) 40 MG tablet TAKE ONE TABLET BY MOUTH ONE TIME DAILY AT 6PM Patient taking differently: Take 40 mg by mouth daily. 12/05/21  Yes Gollan, Kathlene November, MD  torsemide (DEMADEX) 20 MG tablet TAKE ONE TABLET BY MOUTH ONE TIME  DAILY, EXCEPT DO NOT TAKE ON SUNDAYS Patient taking differently: Take 20 mg by mouth once. EXCEPT DO NOT TAKE ON SUNDAYS 06/18/21  Yes Gollan, Kathlene November, MD  clopidogrel (PLAVIX) 75 MG tablet Take 1 tablet (75 mg total) by mouth daily. 06/18/21   Minna Merritts, MD  FLUZONE HIGH-DOSE QUADRIVALENT 0.7 ML SUSY  12/12/20   [provider]  levocetirizine (XYZAL) 5 MG tablet Take 0.5 tablets (2.5 mg total) by mouth at bedtime as needed for allergies. Patient not taking: Reported on 12/31/2021 09/11/21   McLean-Scocuzza,  Nino Glow, MD  PFIZER COVID-19 [facility] BIVALENT injection  12/12/20   [provider]      Allergies    Sugar-protein-starch    Review of Systems   Review of Systems  Neurological:  Positive for weakness.    Physical Exam Updated Vital Signs BP (!) 142/111   Pulse 93   Temp 97.6 F (36.4 C) (Oral)   Resp (!) 22   Ht 1.524 m (5')   Wt 56.7 kg   SpO2 99%   BMI 24.41 kg/m  Physical Exam Vitals and nursing note reviewed.  Constitutional:      Comments: Elderly, frail  HENT:     Head: Normocephalic and atraumatic.     Comments: Patient speaks in a whisper    Right Ear: External ear normal.     Left Ear: External ear normal.  Eyes:     General: No scleral icterus.       Right eye: No discharge.        Left eye: No discharge.     Conjunctiva/sclera: Conjunctivae normal.  Neck:     Trachea: No tracheal deviation.  Cardiovascular:     Rate and Rhythm: Normal rate and regular rhythm.  Pulmonary:     Effort: Pulmonary effort is normal. No respiratory distress.     Breath sounds: No stridor. Rales present. No wheezing.  Abdominal:     General: Bowel sounds are normal. There is no distension.     Palpations: Abdomen is soft.     Tenderness: There is no abdominal tenderness. There is no guarding or rebound.  Musculoskeletal:        General: No tenderness or deformity.     Cervical back: Neck supple.     Right lower leg: Edema present.     Left lower leg: Edema present.  Skin:    General: Skin is warm and dry.     Findings: No rash.  Neurological:     General: No focal deficit present.     Mental Status: She is alert.     Cranial Nerves: No cranial nerve deficit (no facial droop, extraocular movements intact, no slurred speech).     Sensory: No sensory deficit.     Motor: No abnormal muscle tone or seizure activity.     Coordination: Coordination normal.     Comments: Unable lift both legs off the bed, able to grip my hands equally  Psychiatric:        Mood and  Affect: Mood normal.     ED Results / Procedures / Treatments   Labs (all labs ordered are listed, but only abnormal results are displayed) Labs Reviewed  CBC WITH DIFFERENTIAL/PLATELET - Abnormal; Notable for the following components:      Result Value   HCT 46.3 (*)    MCV 102.2 (*)    RDW 16.8 (*)    nRBC 0.3 (*)    All other components within  normal limits  COMPREHENSIVE METABOLIC PANEL - Abnormal; Notable for the following components:   Glucose, Bld 108 (*)    BUN 35 (*)    Creatinine, Ser 1.93 (*)    GFR, Estimated 26 (*)    Anion gap 17 (*)    All other components within normal limits  LIPASE, BLOOD - Abnormal; Notable for the following components:   Lipase 52 (*)    All other components within normal limits  BRAIN NATRIURETIC PEPTIDE - Abnormal; Notable for the following components:   B Natriuretic Peptide >4,500.0 (*)    All other components within normal limits  D-DIMER, QUANTITATIVE - Abnormal; Notable for the following components:   D-Dimer, Quant 1.84 (*)    All other components within normal limits  TROPONIN I (HIGH SENSITIVITY) - Abnormal; Notable for the following components:   Troponin I (High Sensitivity) 58 (*)    All other components within normal limits  TROPONIN I (HIGH SENSITIVITY) - Abnormal; Notable for the following components:   Troponin I (High Sensitivity) 42 (*)    All other components within normal limits  SARS CORONAVIRUS 2 BY RT PCR  TYPE AND SCREEN    EKG EKG Interpretation  Date/Time:  Monday December 31 2021 10:22:26 EDT Ventricular Rate:  86 PR Interval:  172 QRS Duration: 180 QT Interval:  444 QTC Calculation: 531 R Axis:   88 Text Interpretation: Normal sinus rhythm Left bundle branch block ( Cornell product ) Cannot rule out Inferior infarct , age undetermined Abnormal ECG Confirmed by Dorie Rank [phone]) on 12/31/2021 1:30:55 PM  Radiology DG Chest 2 View  Result Date: 12/31/2021 CLINICAL DATA:  Shortness of breath.  Anemia. Leg swelling for several days. History of CHF. EXAM: CHEST - 2 VIEW COMPARISON:  Chest two views 11/13/2021, AP chest 06/08/2021, AP chest 06/05/2021. FINDINGS: Cardiac silhouette is again markedly enlarged. Mediastinal contours are within normal limits with moderate calcification within the aortic arch. Mild-to-moderate bilateral pleural effusions are increased from the most recent 11/13/2021 comparison chest radiographs. Mild bilateral interstitial thickening is slightly increased from 06/05/2021 and may represent mild interstitial pulmonary edema. No pneumothorax is seen. Old healed posterior right sixth rib fracture. Moderate multilevel degenerative disc changes of the visualized thoracic and lumbar spine. Moderate height loss of multiple midthoracic vertebral bodies, grossly similar to 06/05/2021 CT. Other plasty cement is again seen within the T12 vertebral body. IMPRESSION: 1. Mild-to-moderate bilateral pleural effusions, increased from 11/13/2021. 2. Mild bilateral interstitial thickening is slightly increased from 06/05/2021 and may represent mild interstitial pulmonary edema. Electronically Signed   By: Yvonne Kendall M.D.   On: 12/31/2021 11:58    Procedures Procedures    Medications Ordered in ED Medications  ondansetron (ZOFRAN-ODT) disintegrating tablet 4 mg (4 mg Oral Given 12/31/21 1138)  furosemide (LASIX) injection 40 mg (40 mg Intravenous Given 12/31/21 1507)    ED Course/ Medical Decision Making/ A&P Clinical Course as of 12/31/21 1517  Mon Dec 31, 2021  1325 DG Chest 2 View Chest x-ray shows mild to moderate pleural effusions as well as vascular congestion.  Worsening compared to previous x-ray [JK]  1433 CBC with Differential(!) No anemia noted [JK]  1433 Comprehensive metabolic panel(!) Creatinine elevated compared to previous [JK]  1501 Case discussed with Dr Josephine Cables regarding admission [JK]  73 Case discussed with Wannetta Sender, cardiology team.  Cardiology will come  and evaluate patient. [JK]    Clinical Course User Index [JK] Dorie Rank, MD  Medical Decision Making Problems Addressed: Acute on chronic congestive heart failure, unspecified heart failure type [facility]): acute illness or injury that poses a threat to life or bodily functions AKI (acute kidney injury) [facility]): acute illness or injury Elevated d-dimer: acute illness or injury  Amount and/or Complexity of Data Reviewed Labs: ordered. Decision-making details documented in ED Course. Radiology: ordered and independent interpretation performed. Decision-making details documented in ED Course.  Risk Prescription drug management. Decision regarding hospitalization.   Patient presented with complaints of weakness and also shortness of breath.  In the ED noted to have decreased O2 sat to the 80s.  Patient started on supplemental oxygen.  Patient noted to have peripheral edema on exam.  Her ED work-up is notable for increased BUN and creatinine.  Elevated troponin.  Lipase slightly elevated but I do not think that is clinically significant.  She has no to abdominal tenderness on my exam.  D-dimer is also elevated concerning for possibility of PE.  Patient did have an evaluation by her doctor last month.  She had Doppler studies on September 11 that did not show evidence of DVT in her lower extremities.  Does appear that a VQ scan was ordered but I do not see any results.  I suspect the primary etiology of her hypoxia and breathing issues is related to her congestive heart failure.  Have ordered diuretics.  With her new oxygen requirement I will consult the medical service to admit for further treatment.  Will also add on VQ scan instead of a CT angiogram with her increased creatinine and decreased GFR.        Final Clinical Impression(s) / ED Diagnoses Final diagnoses:  Acute on chronic congestive heart failure, unspecified heart failure type ([place])  Elevated d-dimer  AKI  (acute kidney injury) [facility])    Rx / DC Orders ED Discharge Orders     None         Dorie Rank, MD 12/31/21 1517

## 2021-12-31 NOTE — ED Notes (Signed)
Pt educated on not eating or drinking. This nurse walked in the room and found pt drinking bottle water that husband provided.

## 2021-12-31 NOTE — ED Triage Notes (Signed)
[name] stated, she is been treated for paralysis of vocal cords 2 months ago. Shes been getting weaker and weaker this started 3 weeks ago. Pt. Stated, [name] been taking iron pills which is making me sick on my stomach . I also have swelling around my ankles and feet that started this morning.

## 2021-12-31 NOTE — H&P (Addendum)
History and Physical    Patient: [name] DOB: July 01, 1940 DOA: 12/31/2021 DOS: the patient was seen and examined on 12/31/2021 PCP: Carollee Leitz, MD  Patient coming from: Home  Chief Complaint:  Chief Complaint  Patient presents with   Weakness   Anemia   Pruritis   Leg Swelling   HPI: [name] is a 82 y.o. female with medical history significant of chronic systolic heart failure, CAD s/p stent placement, moderate to severe MR, COPD, LBBB paralysis of vocal cords and larynx, essential hypertension, hyperlipidemia who presents to the emergency department due to several weeks onset of  worsening shortness of breath and increasing work of breathing.  She also complained of several months of feeling weak whereby she uses walker and a scooter to get around, she was taking iron pills for days as it was thought to be due to iron deficiency anemia.  She endorsed swelling around her ankles and some increased abdominal swelling.  Shortness of breath worsened within the last few days and she decided to go to the ED today for further evaluation and management.  She denied fever, chills, chest pain, nausea, vomiting.  ED Course:  In the emergency department, temperature was 97.3 F, respiratory rate was 17/min, pulse 87 bpm, BP 121/73, O2 sat 91% on room air, but this drops to upper 80s per ED physician, so supplemental oxygen via [place] at 3 LPM was provided with improved oxygenation to 99%.  Work-up in the ED showed normal CBC except for hematocrit of 46.3, elevated MCV (102.2), lipase 52, troponin x2 -58 > 42, D-dimer 1.84, BNP > 4,500.  SARS coronavirus 2 was negative. Chest x-ray showed: 1. Mild-to-moderate bilateral pleural effusions, increased from 11/13/2021. 2. Mild bilateral interstitial thickening is slightly increased from 06/05/2021 and may represent mild interstitial pulmonary edema. She was treated with IV Lasix 40 mg x 1.  Cardiology was consulted.  Hospitalist was  asked to admit patient for further evaluation and management.  Review of Systems: Review of systems as noted in the HPI. All other systems reviewed and are negative.   Past Medical History:  Diagnosis Date   Anxiety    Arthritis    CAD (coronary artery disease)    a. cardiac cath 10/2014: ostial LAD 30%, prox LAD 90% s/p PCI/DES, ostial RCA 30%, mid RCA 90% s/p PCI/DES, ostial Ramus 50%   Chronic back pain    Chronic fatigue    Chronic systolic CHF (congestive heart failure) ([place])    a. 10/2014 Echo: EF 20%; b. 06/2017 Echo: EF 20-25%, diff HK, Gr2 DD, Mild AI. Mod to sev MR. Mod dil LA.   Hepatitis C    Ischemic cardiomyopathy    a. 10/2014 Echo: EF 20%; b. 06/2017 Echo: EF 20-25%.   LBBB (left bundle branch block)    Moderate to Severe Mitral regurgitation    a. 06/2017 Echo: Mod-Sev MR.   Osteoporosis    UTI (lower urinary tract infection)    Past Surgical History:  Procedure Laterality Date   BACK SURGERY  07/14   T10, L1   CARDIAC CATHETERIZATION Bilateral 10/27/2014   Procedure: Left Heart Cath and Coronary Angiography;  Surgeon: Minna Merritts, MD;  Location: [facility];  Service: Cardiovascular;  Laterality: Bilateral;   CARDIAC CATHETERIZATION N/A 10/27/2014   Procedure: Coronary Stent Intervention;  Surgeon: Wellington Hampshire, MD;  Location: [facility];  Service: Cardiovascular;  Laterality: N/A;   CORONARY STENT INTERVENTION N/A 06/13/2021  Procedure: CORONARY STENT INTERVENTION;  Surgeon: Nelva Bush, MD;  Location: [facility];  Service: Cardiovascular;  Laterality: N/A;   HIP ARTHROPLASTY Left 06/07/2021   Procedure: ARTHROPLASTY BIPOLAR HIP (HEMIARTHROPLASTY);  Surgeon: Leim Fabry, MD;  Location: [facility];  Service: Orthopedics;  Laterality: Left;   INTRAVASCULAR PRESSURE WIRE/FFR STUDY N/A 06/13/2021   Procedure: INTRAVASCULAR PRESSURE WIRE/FFR STUDY;  Surgeon: Nelva Bush, MD;  Location: [facility];  Service:  Cardiovascular;  Laterality: N/A;   RIGHT/LEFT HEART CATH AND CORONARY ANGIOGRAPHY N/A 06/13/2021   Procedure: RIGHT/LEFT HEART CATH AND CORONARY ANGIOGRAPHY;  Surgeon: Nelva Bush, MD;  Location: [facility];  Service: Cardiovascular;  Laterality: N/A;    Social History:  reports that she has quit smoking. Her smoking use included cigarettes. She has a 10.00 pack-year smoking history. She has never used smokeless tobacco. She reports that she does not drink alcohol and does not use drugs.   Allergies  Allergen Reactions   Sugar-Protein-Starch Nausea And Vomiting    Only to sugar    Family History  Problem Relation Age of Onset   Arthritis Mother    Heart disease Father        passed at 35   Diabetes Father      Prior to Admission medications   Medication Sig Start Date End Date Taking? Authorizing Provider  albuterol (VENTOLIN HFA) 108 (90 Base) MCG/ACT inhaler Inhale 2 puffs into the lungs every 6 (six) hours as needed for wheezing or shortness of breath. 11/06/21 11/06/22 Yes [provider]  ALPRAZolam (XANAX) 0.25 MG tablet TAKE ONE TABLET BY MOUTH ONCE TO TWICE DAILY AS NEEDED Patient taking differently: Take 0.25 mg by mouth 2 (two) times daily as needed for anxiety. 06/25/21  Yes McLean-Scocuzza, Nino Glow, MD  aspirin 81 MG chewable tablet Chew 1 tablet (81 mg total) by mouth daily. 06/16/21  Yes Dwyane Dee, MD  citalopram (CELEXA) 10 MG tablet Take 1 tablet (10 mg total) by mouth daily. 06/25/21  Yes McLean-Scocuzza, Nino Glow, MD  denosumab (PROLIA) 60 MG/ML SOSY injection Inject 60 mg into the skin every 6 (six) months. 11/06/17  Yes McLean-Scocuzza, Nino Glow, MD  ipratropium (ATROVENT) 0.06 % nasal spray Place 2 sprays into both nostrils 4 (four) times daily. 10/04/21  Yes McLean-Scocuzza, Nino Glow, MD  metoprolol succinate (TOPROL-XL) 25 MG 24 hr tablet Take 0.5 tablets (12.5 mg total) by mouth daily. 06/18/21  Yes Gollan, Kathlene November, MD  midodrine (PROAMATINE)  2.5 MG tablet Take 1 tablet (2.5 mg total) by mouth 3 (three) times daily with meals. 08/17/21  Yes Gollan, Kathlene November, MD  ondansetron (ZOFRAN-ODT) 4 MG disintegrating tablet TAKE 1 TABLET BY MOUTH TWICE A DAY AS NEEDED FOR NAUSEA AND VOMITING appt further refills no exceptions Patient taking differently: Take 4 mg by mouth 2 (two) times daily as needed for nausea or vomiting. 06/25/21  Yes McLean-Scocuzza, Nino Glow, MD  oxyCODONE (OXY IR/ROXICODONE) 5 MG immediate release tablet Take 1 tablet (5 mg total) by mouth 2 (two) times daily as needed for moderate pain (pain score 4-6). 09/11/21  Yes McLean-Scocuzza, Nino Glow, MD  rosuvastatin (CRESTOR) 40 MG tablet TAKE ONE TABLET BY MOUTH ONE TIME DAILY AT 6PM Patient taking differently: Take 40 mg by mouth daily. 12/05/21  Yes Gollan, Kathlene November, MD  torsemide (DEMADEX) 20 MG tablet TAKE ONE TABLET BY MOUTH ONE TIME DAILY, EXCEPT DO NOT TAKE ON SUNDAYS Patient taking differently: Take 20 mg by mouth once. EXCEPT  DO NOT TAKE ON SUNDAYS 06/18/21  Yes Gollan, Kathlene November, MD  clopidogrel (PLAVIX) 75 MG tablet Take 1 tablet (75 mg total) by mouth daily. 06/18/21   Minna Merritts, MD  FLUZONE HIGH-DOSE QUADRIVALENT 0.7 ML SUSY  12/12/20   [provider]  levocetirizine (XYZAL) 5 MG tablet Take 0.5 tablets (2.5 mg total) by mouth at bedtime as needed for allergies. Patient not taking: Reported on 12/31/2021 09/11/21   McLean-Scocuzza, Nino Glow, MD  PFIZER COVID-19 [facility] BIVALENT injection  12/12/20   [provider]    Physical Exam: BP 127/76   Pulse 84   Temp 98.5 F (36.9 C) (Oral)   Resp (!) 37   Ht 5' (1.524 m)   Wt 56.7 kg   SpO2 97%   BMI 24.41 kg/m   General: 81 y.o. year-old female ill appearing, but in no acute distress.  Alert and oriented x3. HEENT: NCAT, EOMI, speech was with whispering voice Neck: Supple, trachea medial Cardiovascular: Regular rate and rhythm with no rubs or gallops.  No thyromegaly or JVD noted.  +2 lower  extremity edema (R > L ).  2/4 pulses in all 4 extremities. Respiratory: Bilateral rales in lower lobes on auscultation with no wheezes.  Abdomen: Soft, nontender nondistended with normal bowel sounds x4 quadrants. Muskuloskeletal: No cyanosis or clubbing  noted bilaterally Neuro: CN II-XII intact, strength 5/5 x 4, sensation, reflexes intact Skin: No ulcerative lesions noted or rashes Psychiatry: Judgement and insight appear normal. Mood is appropriate for condition and setting          Labs on Admission:  Basic Metabolic Panel: Recent Labs  Lab 12/31/21 1119  NA 142  K 4.1  CL 100  CO2 25  GLUCOSE 108*  BUN 35*  CREATININE 1.93*  CALCIUM 9.6   Liver Function Tests: Recent Labs  Lab 12/31/21 1119  AST 37  ALT 26  ALKPHOS 62  BILITOT 0.7  PROT 6.7  ALBUMIN 3.9   Recent Labs  Lab 12/31/21 1119  LIPASE 52*   No results for input(s): "AMMONIA" in the last 168 hours. CBC: Recent Labs  Lab 12/31/21 1119  WBC 8.0  NEUTROABS 5.7  HGB 14.9  HCT 46.3*  MCV 102.2*  PLT 182   Cardiac Enzymes: No results for input(s): "CKTOTAL", "CKMB", "CKMBINDEX", "TROPONINI" in the last 168 hours.  BNP (last 3 results) Recent Labs    06/05/21 1706 06/06/21 0350 12/31/21 1120  BNP 820.3* 1,590.0* >4,500.0*    ProBNP (last 3 results) No results for input(s): "PROBNP" in the last 8760 hours.  CBG: No results for input(s): "GLUCAP" in the last 168 hours.  Radiological Exams on Admission: DG Chest 2 View  Result Date: 12/31/2021 CLINICAL DATA:  Shortness of breath. Anemia. Leg swelling for several days. History of CHF. EXAM: CHEST - 2 VIEW COMPARISON:  Chest two views 11/13/2021, AP chest 06/08/2021, AP chest 06/05/2021. FINDINGS: Cardiac silhouette is again markedly enlarged. Mediastinal contours are within normal limits with moderate calcification within the aortic arch. Mild-to-moderate bilateral pleural effusions are increased from the most recent 11/13/2021 comparison  chest radiographs. Mild bilateral interstitial thickening is slightly increased from 06/05/2021 and may represent mild interstitial pulmonary edema. No pneumothorax is seen. Old healed posterior right sixth rib fracture. Moderate multilevel degenerative disc changes of the visualized thoracic and lumbar spine. Moderate height loss of multiple midthoracic vertebral bodies, grossly similar to 06/05/2021 CT. Other plasty cement is again seen within the T12 vertebral body. IMPRESSION: 1. Mild-to-moderate bilateral  pleural effusions, increased from 11/13/2021. 2. Mild bilateral interstitial thickening is slightly increased from 06/05/2021 and may represent mild interstitial pulmonary edema. Electronically Signed   By: Yvonne Kendall M.D.   On: 12/31/2021 11:58    EKG: I independently viewed the EKG done and my findings are as followed: Normal sinus rhythm at a rate of 86 bpm with LBBB prolonged QTc 531 ms  Assessment/Plan Present on Admission:  Acute on chronic combined systolic and diastolic CHF (congestive heart failure) (HCC)  CAD (coronary artery disease)  Principal Problem:   Acute on chronic combined systolic and diastolic CHF (congestive heart failure) (HCC) Active Problems:   Acute respiratory failure with hypoxia (HCC)   CAD (coronary artery disease)   Mixed hyperlipidemia   COPD (chronic obstructive pulmonary disease) (HCC)   Elevated troponin   Acute kidney injury superimposed on chronic kidney disease (HCC)   Bilateral pleural effusion   Elevated brain natriuretic peptide (BNP) level   Elevated d-dimer   Prolonged QT interval   Elevated lipase   Elevated MCV   Iron deficiency anemia  Acute exacerbation of combined systolic and diastolic CHF Elevated BNP (> 4,500) Bilateral pleural effusion Chest x-ray showed Mild-to-moderate bilateral pleural effusions, increased from 11/13/2021. Continue total input/output, daily weights and fluid restriction IV Lasix 40 mg x 1 was given,  continue IV Lasix 40 mg twice daily Continue heart healthy diet  Echocardiogram done in April 2023 showed LVEF of 25 to 30%.  LV has severely decreased function.  LV demonstrates global hypokinesis.  Moderate asymmetric LVH of the septal segment.  G2 DD.  RV systolic function is low normal.  Moderate MVR.  Echocardiogram will be done Cardiology was consulted by ED team, we shall await further recommendation  Acute respiratory with hypoxia in setting of above Continue supplemental oxygen to maintain O2 sat > 92% with plan to wean patient off oxygen as tolerated (patient does not use oxygen at baseline).  Bilateral lower extremity edema (RLE > LLE) Bilateral lower extremity ultrasound done on 11/12/2021 showed no evidence of DVT in either lower extremity  Elevated D-dimer D-dimer 1.84, VQ scan pending  Therapeutic Lovenox will be given temporarily pending the results of VQ scan  Prolonged QT interval QTc 531 ms  Acute kidney injury superimposed on CKD stage IV BUN/creatinine 35/1.93 (baseline creatinine at 0.9-1.0) Renally adjust medications, avoid nephrotoxic agents/dehydration/hypotension  Elevated lipase Lipase 52, no clinical signs of pancreatitis Continue to monitor lipase and treat accordingly  Elevated MCV MCV 102.2, vitamin B12 and folate levels will be checked  Iron deficiency anemia Continue ferrous sulfate  Elevated troponin possibly secondary to type II demand ischemia Troponin x 2 - 58 > 42  CAD s/p stent placement Continue aspirin, Crestor, Toprol-XL  ?Essential hypertension (controlled) BP was normal  Note: Patient takes midodrine for low blood pressure  COPD Continue albuterol and Atrovent per home regimen  Mixed hyperlipidemia Continue Crestor  DVT prophylaxis: Heparin drip  Code Status: Full code  Consults: Cardiology (by ED team)  Family Communication: Husband at bedside (all questions answered to satisfaction)  Severity of Illness: The  appropriate patient status for this patient is INPATIENT. Inpatient status is judged to be reasonable and necessary in order to provide the required intensity of service to ensure the patient's safety. The patient's presenting symptoms, physical exam findings, and initial radiographic and laboratory data in the context of their chronic comorbidities is felt to place them at high risk for further clinical deterioration. Furthermore, it is not anticipated  that the patient will be medically stable for discharge from the hospital within 2 midnights of admission.   * I certify that at the point of admission it is my clinical judgment that the patient will require inpatient hospital care spanning beyond 2 midnights from the point of admission due to high intensity of service, high risk for further deterioration and high frequency of surveillance required.*  Author: Bernadette Hoit, DO 12/31/2021 4:54 PM  For on call review [email or web address].

## 2021-12-31 NOTE — ED Provider Triage Note (Signed)
Emergency Medicine Provider Triage Evaluation Note  [name] , a 81 y.o. female  was evaluated in triage.  Pt complains of weakness and shortness of breath.  Patient has been having progressively worsening weakness and shortness of breath.  She had recent hip surgery in April and has had increasing weakness.  Now having severe shortness of breath with exertion over the past several weeks.  She was noted to be anemic a few months ago has been on iron and has been feeling nauseous.  She notes severe peripheral edema.  Husband has noticed that she appears pale.  She has been having dark stools but attributes that to her iron use.  Review of Systems  Positive: Shortness of breath and weakness Negative: Chest pain  Physical Exam  BP 121/73 (BP Location: Left Arm)   Pulse 87   Temp (!) 97.3 F (36.3 C) (Oral)   Resp 17   Ht 5' (1.524 m)   Wt 56.7 kg   SpO2 91%   BMI 24.41 kg/m  Gen:   Awake, no distress,pale  Resp:  Winded MSK:   Moves extremities without difficulty  Other:  3+ pitting edema bilateral lower extremities  Medical Decision Making  Medically screening exam initiated at 10:59 AM.  Appropriate orders placed.  [name] was informed that the remainder of the evaluation will be completed by another provider, this initial triage assessment does not replace that evaluation, and the importance of remaining in the ED until their evaluation is complete.  Work-up initiated   Kelly Mail, PA-C 12/31/21 1134

## 2021-12-31 NOTE — Progress Notes (Addendum)
ANTICOAGULATION CONSULT NOTE - Initial Consult  Pharmacy Consult for Heparin Indication: pulmonary embolus  Allergies  Allergen Reactions   Sugar-Protein-Starch Nausea And Vomiting    Only to sugar    Patient Measurements: Height: 5' (152.4 cm) Weight: 56.7 kg (125 lb) IBW/kg (Calculated) : 45.5 Heparin Dosing Weight: 56.7 kg  Vital Signs: Temp: 98.5 F (36.9 C) (10/30 1606) Temp Source: Oral (10/30 1606) BP: 127/76 (10/30 1530) Pulse Rate: 84 (10/30 1530)  Labs: Recent Labs    12/31/21 1119 12/31/21 1319  HGB 14.9  --   HCT 46.3*  --   PLT 182  --   CREATININE 1.93*  --   TROPONINIHS 58* 42*    Estimated Creatinine Clearance: 18.4 mL/min (A) (by C-G formula based on SCr of 1.93 mg/dL (H)).   Medical History: Past Medical History:  Diagnosis Date   Anxiety    Arthritis    CAD (coronary artery disease)    a. cardiac cath 10/2014: ostial LAD 30%, prox LAD 90% s/p PCI/DES, ostial RCA 30%, mid RCA 90% s/p PCI/DES, ostial Ramus 50%   Chronic back pain    Chronic fatigue    Chronic systolic CHF (congestive heart failure) ([place])    a. 10/2014 Echo: EF 20%; b. 06/2017 Echo: EF 20-25%, diff HK, Gr2 DD, Mild AI. Mod to sev MR. Mod dil LA.   Hepatitis C    Ischemic cardiomyopathy    a. 10/2014 Echo: EF 20%; b. 06/2017 Echo: EF 20-25%.   LBBB (left bundle branch block)    Moderate to Severe Mitral regurgitation    a. 06/2017 Echo: Mod-Sev MR.   Osteoporosis    UTI (lower urinary tract infection)     Medications:  No anticoagulation PTA   Assessment: Patient presented with increased weakness and shortness of breath that has acutely worsening in the last few days. She also noticed new leg swelling. Patient noted to be hypoxic while in ED and started on oxygen. Hypoxia combined with SOB and elevated D-dimer at 1.84, initiating treatment for suspected PE.   Of note, patient's Scr is elevated from baseline (0.8-0.9) at 1.93.   Goal of Therapy:  Heparin level 0.3-0.7  units/ml Monitor platelets by anticoagulation protocol: Yes   Plan:  Give 3500 units bolus x 1 Start heparin infusion at 1000 units/hr Check anti-Xa level in 8 hours and daily while on heparin Continue to monitor H&H and platelets  Sinda Du, PharmD Candidate  12/31/2021,4:18 PM

## 2021-12-31 NOTE — Consult Note (Addendum)
Cardiology Consultation   Patient ID: [name] MRN: [id number]; DOB: August 24, 1940  Admit date: 12/31/2021 Date of Consult: 12/31/2021  PCP:  Carollee Leitz, MD   [facility] Cardiologist:  Ida Rogue, MD  Cardiology APP:  Alisa Graff, FNP  {     Patient Profile:   [name] is a 81 y.o. female with a hx of ICM with chronic combined heart failure LVEF 25%, CAD with PCI to mid RCA and proximal LAD in 2016 (intolerant to brilinta), moderate to severe MR, CKD 3, LBBB, and hypotension who is being seen 12/31/2021 for the evaluation of CHF exacerbation at the request of Dr. Josephine Cables.  History of Present Illness:   [name] with the above PMH was hospitalized 06/06/21 after a fall resulting in hip fracture. She has a history of gait disturbance and uses a cane or walker, she suffered a mechanical fall.  Cardiology was consulted for significant volume overload post surgery due to overhydration, and elevated troponin. OP cath was planned, but subsequently completed while she was inpatient. R/LHC showed severe proximal and mid LAD stenosis flanking previously placed stent treated with 2 new DES overlapping original stent, D1 jailed.  She has chronic pain with prior back surgeries. She was discharged to rehab. She was seen in clinic for follow up 08/17/21 and was still volume up. Dr. Rockey Situ recommended weaning off of midodrine as tolerated.  She previously declined ICD and reluctant to start GDMT such as entresto, ultimately limited by hypotension.   She presented to [facility] 12/31/21 with lower extremity swelling, worsening weakness, and vocal cord paralysis. She reported SOB and found to be volume up prompting cardiology consult.   Hb 14.9 sCr 1.93, baseline ?1.35 HS troponin 58 --> 42 (was in the 1900 range in April 2023) BNP > 4500 CXR with bilateral pleural effusions, pulmonary edema D-dimer 1.84 VQ scan pending  She was taken to nuc med but refused to lay  flat on the table. She has received 40 mg lasix IV with another 40 mg IV lasix scheduled for this afternoon. During my interview, she reports shortness of breath and lower extremity swelling but doesn't tell me any other details. She has "already explained this." When asked when she noticed extra fluid, she states repeatedly she doesn't know. When asked about nuc med she says her back is about broke and she can't lay on that table. She denies recent illness or changes in her medical history with the exception of vocal cord paralysis.     Past Medical History:  Diagnosis Date   Anxiety    Arthritis    CAD (coronary artery disease)    a. cardiac cath 10/2014: ostial LAD 30%, prox LAD 90% s/p PCI/DES, ostial RCA 30%, mid RCA 90% s/p PCI/DES, ostial Ramus 50%   Chronic back pain    Chronic fatigue    Chronic systolic CHF (congestive heart failure) ([place])    a. 10/2014 Echo: EF 20%; b. 06/2017 Echo: EF 20-25%, diff HK, Gr2 DD, Mild AI. Mod to sev MR. Mod dil LA.   Hepatitis C    Ischemic cardiomyopathy    a. 10/2014 Echo: EF 20%; b. 06/2017 Echo: EF 20-25%.   LBBB (left bundle branch block)    Moderate to Severe Mitral regurgitation    a. 06/2017 Echo: Mod-Sev MR.   Osteoporosis    UTI (lower urinary tract infection)     Past Surgical History:  Procedure Laterality Date   BACK SURGERY  07/14   T10, L1   CARDIAC CATHETERIZATION Bilateral 10/27/2014   Procedure: Left Heart Cath and Coronary Angiography;  Surgeon: Minna Merritts, MD;  Location: [place] [facility];  Service: Cardiovascular;  Laterality: Bilateral;   CARDIAC CATHETERIZATION N/A 10/27/2014   Procedure: Coronary Stent Intervention;  Surgeon: Wellington Hampshire, MD;  Location: [place] [facility];  Service: Cardiovascular;  Laterality: N/A;   CORONARY STENT INTERVENTION N/A 06/13/2021   Procedure: CORONARY STENT INTERVENTION;  Surgeon: Nelva Bush, MD;  Location: [place] [facility];  Service: Cardiovascular;  Laterality: N/A;    HIP ARTHROPLASTY Left 06/07/2021   Procedure: ARTHROPLASTY BIPOLAR HIP (HEMIARTHROPLASTY);  Surgeon: Leim Fabry, MD;  Location: [facility];  Service: Orthopedics;  Laterality: Left;   INTRAVASCULAR PRESSURE WIRE/FFR STUDY N/A 06/13/2021   Procedure: INTRAVASCULAR PRESSURE WIRE/FFR STUDY;  Surgeon: Nelva Bush, MD;  Location: [place] [facility];  Service: Cardiovascular;  Laterality: N/A;   RIGHT/LEFT HEART CATH AND CORONARY ANGIOGRAPHY N/A 06/13/2021   Procedure: RIGHT/LEFT HEART CATH AND CORONARY ANGIOGRAPHY;  Surgeon: Nelva Bush, MD;  Location: [place] [facility];  Service: Cardiovascular;  Laterality: N/A;     Home Medications:  Prior to Admission medications   Medication Sig Start Date End Date Taking? Authorizing Provider  albuterol (VENTOLIN HFA) 108 (90 Base) MCG/ACT inhaler Inhale 2 puffs into the lungs every 6 (six) hours as needed for wheezing or shortness of breath. 11/06/21 11/06/22 Yes [provider]  ALPRAZolam (XANAX) 0.25 MG tablet TAKE ONE TABLET BY MOUTH ONCE TO TWICE DAILY AS NEEDED Patient taking differently: Take 0.25 mg by mouth 2 (two) times daily as needed for anxiety. 06/25/21  Yes McLean-Scocuzza, Nino Glow, MD  aspirin 81 MG chewable tablet Chew 1 tablet (81 mg total) by mouth daily. 06/16/21  Yes Dwyane Dee, MD  citalopram (CELEXA) 10 MG tablet Take 1 tablet (10 mg total) by mouth daily. 06/25/21  Yes McLean-Scocuzza, Nino Glow, MD  denosumab (PROLIA) 60 MG/ML SOSY injection Inject 60 mg into the skin every 6 (six) months. 11/06/17  Yes McLean-Scocuzza, Nino Glow, MD  ipratropium (ATROVENT) 0.06 % nasal spray Place 2 sprays into both nostrils 4 (four) times daily. 10/04/21  Yes McLean-Scocuzza, Nino Glow, MD  metoprolol succinate (TOPROL-XL) 25 MG 24 hr tablet Take 0.5 tablets (12.5 mg total) by mouth daily. 06/18/21  Yes Gollan, Kathlene November, MD  midodrine (PROAMATINE) 2.5 MG tablet Take 1 tablet (2.5 mg total) by mouth 3 (three) times daily with meals. 08/17/21  Yes  Gollan, Kathlene November, MD  ondansetron (ZOFRAN-ODT) 4 MG disintegrating tablet TAKE 1 TABLET BY MOUTH TWICE A DAY AS NEEDED FOR NAUSEA AND VOMITING appt further refills no exceptions Patient taking differently: Take 4 mg by mouth 2 (two) times daily as needed for nausea or vomiting. 06/25/21  Yes McLean-Scocuzza, Nino Glow, MD  oxyCODONE (OXY IR/ROXICODONE) 5 MG immediate release tablet Take 1 tablet (5 mg total) by mouth 2 (two) times daily as needed for moderate pain (pain score 4-6). 09/11/21  Yes McLean-Scocuzza, Nino Glow, MD  rosuvastatin (CRESTOR) 40 MG tablet TAKE ONE TABLET BY MOUTH ONE TIME DAILY AT 6PM Patient taking differently: Take 40 mg by mouth daily. 12/05/21  Yes Gollan, Kathlene November, MD  torsemide (DEMADEX) 20 MG tablet TAKE ONE TABLET BY MOUTH ONE TIME DAILY, EXCEPT DO NOT TAKE ON SUNDAYS Patient taking differently: Take 20 mg by mouth once. EXCEPT DO NOT TAKE ON SUNDAYS 06/18/21  Yes Gollan, Kathlene November, MD  clopidogrel (PLAVIX) 75 MG  tablet Take 1 tablet (75 mg total) by mouth daily. 06/18/21   Minna Merritts, MD  FLUZONE HIGH-DOSE QUADRIVALENT 0.7 ML SUSY  12/12/20   [provider]  levocetirizine (XYZAL) 5 MG tablet Take 0.5 tablets (2.5 mg total) by mouth at bedtime as needed for allergies. Patient not taking: Reported on 12/31/2021 09/11/21   McLean-Scocuzza, Nino Glow, MD  PFIZER COVID-19 [facility] BIVALENT injection  12/12/20   [provider]    Inpatient Medications: Scheduled Meds:  Continuous Infusions:  PRN Meds:   Allergies:    Allergies  Allergen Reactions   Sugar-Protein-Starch Nausea And Vomiting    Only to sugar    Social History:   Social History   Socioeconomic History   Marital status: Married    Spouse name: Esteen Delpriore   Number of children: 0   Years of education: 14   Highest education level: Not on file  Occupational History   Occupation: Receptionist    Comment: Retired in [place] Use   Smoking status: Former    Packs/day: 1.00     Years: 10.00    Total pack years: 10.00    Types: Cigarettes   Smokeless tobacco: Never  Vaping Use   Vaping Use: Never used  Substance and Sexual Activity   Alcohol use: No    Alcohol/week: 0.0 standard drinks of alcohol   Drug use: No   Sexual activity: Yes    Birth control/protection: Post-menopausal  Other Topics Concern   Not on file  Social History Narrative   Pt is 81 yo female, married to husband of 21 years.  Pt has no children.  Pt lives with husband and teacup poodle, [name]. 29 y.o as of 01/01/19.  Pt states she hurt her back last year and has not been able to participate in much physical activity since. Pt enjoys traveling with her husband in motor home, doing crossword puzzles with husband, and playing cards with friends.  Pt was raised in [facility] [place] [facility] and has lived in [place] entire adult life.  Pt worked in [employer] in [place] until [place] when they closed.     Social Determinants of Health   Financial Resource Strain: Low Risk  (03/01/2021)   Overall Financial Resource Strain (CARDIA)    Difficulty of Paying Living Expenses: Not hard at all  Food Insecurity: No Food Insecurity (03/01/2021)   Hunger Vital Sign    Worried About Running Out of Food in the Last Year: Never true    Ran Out of Food in the Last Year: Never true  Transportation Needs: No Transportation Needs (03/01/2021)   PRAPARE - Hydrologist (Medical): No    Lack of Transportation (Non-Medical): No  Physical Activity: Insufficiently Active (03/01/2021)   Exercise Vital Sign    Days of Exercise per Week: 5 days    Minutes of Exercise per Session: 10 min  Stress: No Stress Concern Present (03/01/2021)   [place]    Feeling of Stress : Not at all  Social Connections: Unknown (03/01/2021)   Social Connection and Isolation Panel [NHANES]    Frequency of Communication with Friends and Family: Not on  file    Frequency of Social Gatherings with Friends and Family: Not on file    Attends Religious Services: Not on file    Active Member of Clubs or Organizations: Not on file    Attends Archivist Meetings: Not on  file    Marital Status: Married  Human resources officer Violence: Not At Risk (03/01/2021)   Humiliation, Afraid, Rape, and Kick questionnaire    Fear of Current or Ex-Partner: No    Emotionally Abused: No    Physically Abused: No    Sexually Abused: No    Family History:    Family History  Problem Relation Age of Onset   Arthritis Mother    Heart disease Father        passed at 28   Diabetes Father      ROS:  Please see the history of present illness.   All other ROS reviewed and negative.     Physical Exam/Data:   Vitals:   12/31/21 1345 12/31/21 1408 12/31/21 1415 12/31/21 1430  BP: (!) 107/94   (!) 142/111  Pulse: 85 85 87 93  Resp: (!) 26 (!) 26 (!) 23 (!) 22  Temp:      TempSrc:      SpO2: 99% 99% 97% 99%  Weight:      Height:       No intake or output data in the 24 hours ending 12/31/21 1527    12/31/2021   10:53 AM 11/20/2021    1:48 PM 09/11/2021    1:27 PM  Last 3 Weights  Weight (lbs) 125 lb 122 lb 2 oz 129 lb  Weight (kg) 56.7 kg 55.396 kg 58.514 kg     Body mass index is 24.41 kg/m.  General:  elderly female in NAD HEENT: normal Neck: mild JVD Vascular: No carotid bruits; Distal pulses 2+ bilaterally Cardiac:  normal S1, S2; RRR; no murmur  Lungs:  crackles in bases Abd: soft, nontender, no hepatomegaly  Ext: B LE edema Musculoskeletal:  No deformities, BUE and BLE strength normal and equal Skin: warm and dry  Neuro:  CNs 2-12 intact, no focal abnormalities noted Psych:  Normal affect   EKG:  The EKG was personally reviewed and demonstrates:  sinus rhythm HR 86, LBBB Telemetry:  Telemetry was personally reviewed and demonstrates:  not currently on telemetry, alarms reviewed  Relevant CV Studies:  Right and Left heart  cath 06/12/21 Conclusions: Severe single-vessel coronary artery disease with sequential 80-90% proximal/mid LAD stenoses flanking previously placed, which are highly significant by iFR and involve D1 and D2. Mild-moderate, nonobstructive CAD involving ramus intermedius and RCA. Widely patent mid LAD and mid RCA stents. Mildly elevated left and right heart filling pressures. Mild-moderate pulmonary hypertension. Mildly-moderately reduced Fick cardiac output/index. Successful PCI to proximal LAD using Onyx Frontier 3.0 x 12 mm drug-eluting stent (jailing D1) with 0% residual stenosis and TIMI-3 flow. Successful PCI to mid LAD using Onyx Frontier 2.5 x 15 mm drug-eluting stent (jailing D2) with angioplasty of ostium of D2.  There is 0% residual stenosis in the LAD and 80% residual stenosis at the ostium of D2 with TIMI-3 flow. Challenging intervention via right radial approach due to tortuosity of the right subclavian artery and radial artery vasospasm.  Consider alternative access for future catheterizations.   Recommendations: Dual antiplatelet therapy with aspirin and clopidogrel for at least 12 months. Aggressive secondary prevention of coronary artery disease. Continued gentle diuresis. Escalate goal-directed medical therapy for acute on chronic HFrEF.  Echo 06/06/21:  1. Left ventricular ejection fraction, by estimation, is 25 to 30%. The  left ventricle has severely decreased function. The left ventricle  demonstrates global hypokinesis. The left ventricular internal cavity size  was mildly dilated. There is moderate  asymmetric left ventricular  hypertrophy of the septal segment. Left  ventricular diastolic parameters are consistent with Grade II diastolic  dysfunction (pseudonormalization).   2. Right ventricular systolic function is low normal. The right  ventricular size is mildly enlarged.   3. Left atrial size was moderately dilated.   4. The mitral valve is normal in structure.  Moderate mitral valve  regurgitation.   5. The aortic valve is tricuspid. Aortic valve regurgitation is trivial.   Laboratory Data:  High Sensitivity Troponin:   Recent Labs  Lab 12/31/21 1119 12/31/21 1319  TROPONINIHS 58* 42*     Chemistry Recent Labs  Lab 12/31/21 1119  NA 142  K 4.1  CL 100  CO2 25  GLUCOSE 108*  BUN 35*  CREATININE 1.93*  CALCIUM 9.6  GFRNONAA 26*  ANIONGAP 17*    Recent Labs  Lab 12/31/21 1119  PROT 6.7  ALBUMIN 3.9  AST 37  ALT 26  ALKPHOS 62  BILITOT 0.7   Lipids No results for input(s): "CHOL", "TRIG", "HDL", "LABVLDL", "LDLCALC", "CHOLHDL" in the last 168 hours.  Hematology Recent Labs  Lab 12/31/21 1119  WBC 8.0  RBC 4.53  HGB 14.9  HCT 46.3*  MCV 102.2*  MCH 32.9  MCHC 32.2  RDW 16.8*  PLT 182   Thyroid No results for input(s): "TSH", "FREET4" in the last 168 hours.  BNP Recent Labs  Lab 12/31/21 1120  BNP >4,500.0*    DDimer  Recent Labs  Lab 12/31/21 1342  DDIMER 1.84*     Radiology/Studies:  DG Chest 2 View  Result Date: 12/31/2021 CLINICAL DATA:  Shortness of breath. Anemia. Leg swelling for several days. History of CHF. EXAM: CHEST - 2 VIEW COMPARISON:  Chest two views 11/13/2021, AP chest 06/08/2021, AP chest 06/05/2021. FINDINGS: Cardiac silhouette is again markedly enlarged. Mediastinal contours are within normal limits with moderate calcification within the aortic arch. Mild-to-moderate bilateral pleural effusions are increased from the most recent 11/13/2021 comparison chest radiographs. Mild bilateral interstitial thickening is slightly increased from 06/05/2021 and may represent mild interstitial pulmonary edema. No pneumothorax is seen. Old healed posterior right sixth rib fracture. Moderate multilevel degenerative disc changes of the visualized thoracic and lumbar spine. Moderate height loss of multiple midthoracic vertebral bodies, grossly similar to 06/05/2021 CT. Other plasty cement is again seen  within the T12 vertebral body. IMPRESSION: 1. Mild-to-moderate bilateral pleural effusions, increased from 11/13/2021. 2. Mild bilateral interstitial thickening is slightly increased from 06/05/2021 and may represent mild interstitial pulmonary edema. Electronically Signed   By: Yvonne Kendall M.D.   On: 12/31/2021 11:58     Assessment and Plan:   Acute on chronic combined systolic and diastolic heart failure Ischemic cardiomyopathy Hypotension Acute on chronic renal disease III - echo 06/2021 with LVEF stable at 25-30%, grade 2 DD, moderately dilated LA, moderate MR - GDMT has been challenging with marginal BP, PTA was on toprol, midodrine, torsemide - she is significantly volume up - has received 40 mg IV lasix with good urine output, per the patient and visualization of purewick canister  - agree with aggressive diuresis - will need at least 40 mg IV lasix BID to surmount renal function - suspect AoCKD from hypervolemia - primary has ordered repeat echo given BNP > 4500 - sCr 1.93, baseline appears to be 1.35   CAD  s/p PCI to LAD / RCA 2016 s/p PCI to LAD 2023 ASA and plavix No chest pain   Hyperlipidemia with LDL goal < 70 05/08/2021: Cholesterol, Total 176;  HDL 48; LDL Chol Calc (NIH) 81; Triglycerides 285 Continue crestor 40 mg   Elevated d-dimer Pt refused VQ scan Pt on DAPT with ASA and plavix, no heparin without clear evidence of embolism   For questions or updates, please contact [place] Please consult [email or web address] for contact info under    Signed, Ledora Bottcher, PA  12/31/2021 3:27 PM   Patient seen and examined   I agree with findings as noted above by A [facility] Patient is an 54 who follows with T Gollan  Hx of CAD, ICM (echo in April 2023 LVEF 25 to 30%), mod  MR, CKD, LBBB>   Admitted for inceased weakness, SOB and LE edema    She has currently received IV lasix in ER Note that her medical Rx has been limited by BP   on midodrine at home      On exam she now appears comfortable Neck:  JVP does not appear elevated  Lungs are CTA Cardiac RRR  No S3     Abd is benign   No RUQ tenderness Ext with tr to 1+ edema   HFrEF   Acute decomplensation.   Agree with lasix  FOllow response, renal function Echo has been oredered   Assess RV functoin given elevated D Dimer (pt refuses scan)  Will continue to follow.  Dorris Carnes MD

## 2022-01-01 ENCOUNTER — Inpatient Hospital Stay (HOSPITAL_COMMUNITY): Payer: Medicare HMO

## 2022-01-01 ENCOUNTER — Ambulatory Visit: Payer: Medicare HMO | Admitting: Internal Medicine

## 2022-01-01 ENCOUNTER — Other Ambulatory Visit (HOSPITAL_COMMUNITY): Payer: Self-pay

## 2022-01-01 DIAGNOSIS — I5043 Acute on chronic combined systolic (congestive) and diastolic (congestive) heart failure: Secondary | ICD-10-CM | POA: Diagnosis not present

## 2022-01-01 DIAGNOSIS — I5042 Chronic combined systolic (congestive) and diastolic (congestive) heart failure: Secondary | ICD-10-CM | POA: Diagnosis not present

## 2022-01-01 LAB — ECHOCARDIOGRAM COMPLETE
Area-P 1/2: 5.64 cm2
Calc EF: 19 %
Height: 60 in
MV M vel: 5.1 m/s
MV Peak grad: 104 mmHg
P 1/2 time: 248 msec
Radius: 0.6 cm
S' Lateral: 5.9 cm
Single Plane A2C EF: 22.7 %
Single Plane A4C EF: 0 %
Weight: 2033.6 oz

## 2022-01-01 LAB — COMPREHENSIVE METABOLIC PANEL
ALT: 29 U/L (ref 0–44)
AST: 29 U/L (ref 15–41)
Albumin: 3.8 g/dL (ref 3.5–5.0)
Alkaline Phosphatase: 62 U/L (ref 38–126)
Anion gap: 17 — ABNORMAL HIGH (ref 5–15)
BUN: 31 mg/dL — ABNORMAL HIGH (ref 8–23)
CO2: 27 mmol/L (ref 22–32)
Calcium: 9.7 mg/dL (ref 8.9–10.3)
Chloride: 100 mmol/L (ref 98–111)
Creatinine, Ser: 1.85 mg/dL — ABNORMAL HIGH (ref 0.44–1.00)
GFR, Estimated: 27 mL/min — ABNORMAL LOW (ref >60)
Glucose, Bld: 111 mg/dL — ABNORMAL HIGH (ref 70–99)
Potassium: 3.5 mmol/L (ref 3.5–5.1)
Sodium: 144 mmol/L (ref 135–145)
Total Bilirubin: 0.9 mg/dL (ref 0.3–1.2)
Total Protein: 6.5 g/dL (ref 6.5–8.1)

## 2022-01-01 LAB — CBC
HCT: 44.2 % (ref 36.0–46.0)
Hemoglobin: 14.3 g/dL (ref 12.0–15.0)
MCH: 32.4 pg (ref 26.0–34.0)
MCHC: 32.4 g/dL (ref 30.0–36.0)
MCV: 100.2 fL — ABNORMAL HIGH (ref 80.0–100.0)
Platelets: 174 10*3/uL (ref 150–400)
RBC: 4.41 MIL/uL (ref 3.87–5.11)
RDW: 16.7 % — ABNORMAL HIGH (ref 11.5–15.5)
WBC: 8.1 10*3/uL (ref 4.0–10.5)
nRBC: 0 % (ref 0.0–0.2)

## 2022-01-01 LAB — VITAMIN B12: Vitamin B-12: 1507 pg/mL — ABNORMAL HIGH (ref 180–914)

## 2022-01-01 LAB — FOLATE: Folate: >40 ng/mL (ref >5.9)

## 2022-01-01 LAB — HEPARIN LEVEL (UNFRACTIONATED): Heparin Unfractionated: 1.09 IU/mL — ABNORMAL HIGH (ref 0.30–0.70)

## 2022-01-01 MED ORDER — SENNOSIDES-DOCUSATE SODIUM 8.6-50 MG PO TABS
1.0000 | ORAL_TABLET | Freq: Two times a day (BID) | ORAL | Status: DC
  Administered 2022-01-01 – 2022-01-03 (×6): 1 via ORAL
  Filled 2022-01-01 (×6): qty 1

## 2022-01-01 MED ORDER — CLOPIDOGREL BISULFATE 75 MG PO TABS
75.0000 mg | ORAL_TABLET | Freq: Every day | ORAL | Status: DC
  Administered 2022-01-01 – 2022-01-02 (×2): 75 mg via ORAL
  Filled 2022-01-01 (×2): qty 1

## 2022-01-01 MED ORDER — ENOXAPARIN SODIUM 30 MG/0.3ML IJ SOSY
30.0000 mg | PREFILLED_SYRINGE | Freq: Every day | INTRAMUSCULAR | Status: DC
  Administered 2022-01-01 – 2022-01-02 (×2): 30 mg via SUBCUTANEOUS
  Filled 2022-01-01 (×2): qty 0.3

## 2022-01-01 MED ORDER — OXYCODONE HCL 5 MG PO TABS
5.0000 mg | ORAL_TABLET | Freq: Four times a day (QID) | ORAL | Status: DC | PRN
  Administered 2022-01-02: 5 mg via ORAL
  Filled 2022-01-01: qty 1

## 2022-01-01 MED ORDER — CITALOPRAM HYDROBROMIDE 10 MG PO TABS
10.0000 mg | ORAL_TABLET | Freq: Every day | ORAL | Status: DC
  Administered 2022-01-01 – 2022-01-04 (×4): 10 mg via ORAL
  Filled 2022-01-01 (×4): qty 1

## 2022-01-01 MED ORDER — PERFLUTREN LIPID MICROSPHERE
1.0000 mL | INTRAVENOUS | Status: AC | PRN
  Administered 2022-01-01: 5 mL via INTRAVENOUS

## 2022-01-01 MED ORDER — ALPRAZOLAM 0.25 MG PO TABS
0.2500 mg | ORAL_TABLET | Freq: Two times a day (BID) | ORAL | Status: DC | PRN
  Administered 2022-01-01: 0.25 mg via ORAL
  Filled 2022-01-01 (×2): qty 1

## 2022-01-01 MED ORDER — ROSUVASTATIN CALCIUM 20 MG PO TABS: 40.0000 mg | ORAL_TABLET | Freq: Every day | ORAL | Status: DC

## 2022-01-01 NOTE — Progress Notes (Signed)
   Heart Failure Stewardship Pharmacist Progress Note   PCP: Carollee Leitz, MD PCP-Cardiologist: Ida Rogue, MD    HPI:  81 yo F with PMH of CHF, CAD, COPD, LBBB, paralysis of vocal cords, HTN, and HLD.  Last admitted in 06/2021 after a fall from hip fracture. Postop course complicated by volume overload. ECHO at that time with EF 25-30%. R/LHC showed severe proximal and mid LAD stenosis s/p PCI with 2 DES, patent RCA stents, and mildly elevated filling pressures with mildly reduced cardiac output (RA 7, PA 30, wedge 22, CO 3.3, CI 2.0). GDMT limited by hypotension requiring midodrine at discharge.  Presented to the ED on 10/30 with shortness of breath, fatigue, and LE edema. CXR with mild to moderate bilateral pleural effusions and mild interstitial pulmonary edema. BNP >4500. ECHO 10/31 showed LVEF <20%, global hypokinesis, mild LVH, RV mildly reduced. Prior ECHO 06/2021 with LVEF 25-30%.  Current HF Medications: Diuretic: furosemide 40 mg IV BID Beta Blocker: metoprolol XL 12.5 mg daily *also on midodrine 2.5 mg TID  Prior to admission HF Medications: Diuretic: torsemide 20 mg daily (except Sundays) Beta blocker: metoprolol XL 12.5 mg daily *also on midodrine 2.5 mg TID  Pertinent Lab Values: Serum creatinine 1.85, BUN 31, Potassium 3.5, Sodium 144, BNP >4500  Vital Signs: Weight: 127 lbs (admission weight: estimated 125 lbs) Blood pressure: 120/70s  Heart rate: 90s  I/O: -1L yesterday; net -1.1L  Medication Assistance / Insurance Benefits Check: Does the patient have prescription insurance?  Yes Type of insurance plan: [place] [employer]  Outpatient Pharmacy:  Prior to admission outpatient pharmacy: [facility] Is the patient willing to use [facility] at discharge? Yes Is the patient willing to transition their outpatient pharmacy to utilize a [facility] outpatient pharmacy?   Pending   Assessment: 1. Acute on chronic systolic CHF (LVEF <41%), due to ICM. NYHA class III  symptoms. - Continue furosemide 40 mg IV BID. Strict I/Os and daily weights. Recommend KCl 40 mEq x 1 to keep K>4. Check magnesium in AM. - Continue metoprolol XL 12.5 mg daily - BP limiting ACE/ARB/ARNI and MRA - Consider adding SGLT2i prior to discharge - Continue midodrine 2.5 mg TID to allow for aggressive diuresis   Plan: 1) Medication changes recommended at this time: - Continue IV diuresis  2) Patient assistance: - Farxiga copay $47 - Jardiance copay $47  3)  Education  - To be completed prior to discharge  Kerby Nora, PharmD, BCPS Heart Failure Stewardship Pharmacist Phone [phone]

## 2022-01-01 NOTE — Progress Notes (Signed)
  Echocardiogram 2D Echocardiogram has been performed.  Darlina Sicilian M 01/01/2022, 10:50 AM

## 2022-01-01 NOTE — Progress Notes (Signed)
   Discussed with Dr. Harrington Challenger - will order repeat 2 view chest x-ray to see if she would benefit from thoracentesis for pleural effusion. Please see rounding note from today for any additional plans.  Darreld Mclean, PA-C 01/01/2022 2:03 PM

## 2022-01-01 NOTE — Progress Notes (Addendum)
Rounding Note    Patient Name: [name] Date of Encounter: 01/01/2022  [place] Cardiologist: Ida Rogue, MD   Subjective   No acute overnight events. Patient states she does not feel very well this morning. No pain. Just feels very weak. She does feel like her breathing and lower extremity edema of improving slightly. No chest pain or palpitations. She does have some lightheadedness/dizziness this morning.  Inpatient Medications    Scheduled Meds:  aspirin  81 mg Oral Daily   citalopram  10 mg Oral Daily   clopidogrel  75 mg Oral Daily   enoxaparin (LOVENOX) injection  30 mg Subcutaneous Daily   ferrous sulfate  325 mg Oral Q breakfast   furosemide  40 mg Intravenous Q12H   ipratropium  2 spray Each Nare QID   metoprolol succinate  12.5 mg Oral Daily   midodrine  2.5 mg Oral TID WC   rosuvastatin  40 mg Oral Daily   senna-docusate  1 tablet Oral BID   Continuous Infusions:  PRN Meds: acetaminophen **OR** acetaminophen, albuterol, ALPRAZolam, oxyCODONE   Vital Signs    Vitals:   12/31/21 1957 12/31/21 2343 01/01/22 0458 01/01/22 0759  BP: 120/69 123/68 (!) 157/67 123/70  Pulse: 82 78 91 100  Resp: 20 (!) 22 (!) 25 20  Temp: 97.7 F (36.5 C) 98 F (36.7 C) 97.7 F (36.5 C) 97.7 F (36.5 C)  TempSrc: Oral Oral Oral Oral  SpO2: 98% 98% 99% 100%  Weight:   57.7 kg   Height:        Intake/Output Summary (Last 24 hours) at 01/01/2022 1018 Last data filed at 01/01/2022 0938 Gross per 24 hour  Intake 444.05 ml  Output 1750 ml  Net -1305.95 ml      01/01/2022    4:58 AM 12/31/2021   10:53 AM 11/20/2021    1:48 PM  Last 3 Weights  Weight (lbs) 127 lb 1.6 oz 125 lb 122 lb 2 oz  Weight (kg) 57.652 kg 56.7 kg 55.396 kg      Telemetry    Sinus rhythm with occasional PVCs and ventricular couplets. Rates in the 80s to low 100s. - Personally Reviewed  ECG    No new ECG tracing today. - Personally Reviewed  Physical Exam   GEN:  No  acute distress.   Neck: No JVD appreciated. Cardiac: RRR. Soft II/VI systolic murmur noted at the apex. Respiratory: Mild increased work of breathing. Diminished breath sounds in bilateral bases. GI: Soft, non-distended, and non-tender. MS: Trace lower extremity edema bilaterally. No deformity. Skin: Warm and dry. Neuro:  No focal deficits. Psych: Normal affect. Responds appropriately.   Labs    High Sensitivity Troponin:   Recent Labs  Lab 12/31/21 1119 12/31/21 1319  TROPONINIHS 58* 42*     Chemistry Recent Labs  Lab 12/31/21 1119 01/01/22 0049  NA 142 144  K 4.1 3.5  CL 100 100  CO2 25 27  GLUCOSE 108* 111*  BUN 35* 31*  CREATININE 1.93* 1.85*  CALCIUM 9.6 9.7  PROT 6.7 6.5  ALBUMIN 3.9 3.8  AST 37 29  ALT 26 29  ALKPHOS 62 62  BILITOT 0.7 0.9  GFRNONAA 26* 27*  ANIONGAP 17* 17*    Lipids No results for input(s): "CHOL", "TRIG", "HDL", "LABVLDL", "LDLCALC", "CHOLHDL" in the last 168 hours.  Hematology Recent Labs  Lab 12/31/21 1119 01/01/22 0049  WBC 8.0 8.1  RBC 4.53 4.41  HGB 14.9 14.3  HCT  46.3* 44.2  MCV 102.2* 100.2*  MCH 32.9 32.4  MCHC 32.2 32.4  RDW 16.8* 16.7*  PLT 182 174   Thyroid No results for input(s): "TSH", "FREET4" in the last 168 hours.  BNP Recent Labs  Lab 12/31/21 1120  BNP >4,500.0*    DDimer  Recent Labs  Lab 12/31/21 1342  DDIMER 1.84*     Radiology    DG Chest 2 View  Result Date: 12/31/2021 CLINICAL DATA:  Shortness of breath. Anemia. Leg swelling for several days. History of CHF. EXAM: CHEST - 2 VIEW COMPARISON:  Chest two views 11/13/2021, AP chest 06/08/2021, AP chest 06/05/2021. FINDINGS: Cardiac silhouette is again markedly enlarged. Mediastinal contours are within normal limits with moderate calcification within the aortic arch. Mild-to-moderate bilateral pleural effusions are increased from the most recent 11/13/2021 comparison chest radiographs. Mild bilateral interstitial thickening is slightly increased  from 06/05/2021 and may represent mild interstitial pulmonary edema. No pneumothorax is seen. Old healed posterior right sixth rib fracture. Moderate multilevel degenerative disc changes of the visualized thoracic and lumbar spine. Moderate height loss of multiple midthoracic vertebral bodies, grossly similar to 06/05/2021 CT. Other plasty cement is again seen within the T12 vertebral body. IMPRESSION: 1. Mild-to-moderate bilateral pleural effusions, increased from 11/13/2021. 2. Mild bilateral interstitial thickening is slightly increased from 06/05/2021 and may represent mild interstitial pulmonary edema. Electronically Signed   By: Yvonne Kendall M.D.   On: 12/31/2021 11:58    Cardiac Studies   Echocardiogram 06/06/2021: Impressions: 1. Left ventricular ejection fraction, by estimation, is 25 to 30%. The  left ventricle has severely decreased function. The left ventricle  demonstrates global hypokinesis. The left ventricular internal cavity size  was mildly dilated. There is moderate  asymmetric left ventricular hypertrophy of the septal segment. Left  ventricular diastolic parameters are consistent with Grade II diastolic  dysfunction (pseudonormalization).   2. Right ventricular systolic function is low normal. The right  ventricular size is mildly enlarged.   3. Left atrial size was moderately dilated.   4. The mitral valve is normal in structure. Moderate mitral valve  regurgitation.   5. The aortic valve is tricuspid. Aortic valve regurgitation is trivial.  _______________  Right/ Left Cardiac Catheterization 06/13/2021: Conclusions: Severe single-vessel coronary artery disease with sequential 80-90% proximal/mid LAD stenoses flanking previously placed, which are highly significant by iFR and involve D1 and D2. Mild-moderate, nonobstructive CAD involving ramus intermedius and RCA. Widely patent mid LAD and mid RCA stents. Mildly elevated left and right heart filling  pressures. Mild-moderate pulmonary hypertension. Mildly-moderately reduced Fick cardiac output/index. Successful PCI to proximal LAD using Onyx Frontier 3.0 x 12 mm drug-eluting stent (jailing D1) with 0% residual stenosis and TIMI-3 flow. Successful PCI to mid LAD using Onyx Frontier 2.5 x 15 mm drug-eluting stent (jailing D2) with angioplasty of ostium of D2.  There is 0% residual stenosis in the LAD and 80% residual stenosis at the ostium of D2 with TIMI-3 flow. Challenging intervention via right radial approach due to tortuosity of the right subclavian artery and radial artery vasospasm.  Consider alternative access for future catheterizations.   Recommendations: Dual antiplatelet therapy with aspirin and clopidogrel for at least 12 months. Aggressive secondary prevention of coronary artery disease. Continued gentle diuresis. Escalate goal-directed medical therapy for acute on chronic HFrEF. Diagnostic Dominance: Right  Intervention     Patient Profile     81 y.o. female with a history of CAD s/p prior stenting to mid RCA and proximal LAD in 2016  and more recently successful PC with DES to proximal LAD and DES to mid LAD in 06/2021 (overlapping prior stent on both sides), ischemic cardiomyopathy/ chronic combined CHF with EF of 25-30% on Echo in 06/2021, moderate to severe MR, LBBB, hypotension on Midodrine, hyperlipidemia, COPD,  Hepatitis C, chronic anemia, and chronic back pain who was admitted on 12/31/2021 for acute on chronic CHF with acute hypoxic respiratory failure after presenting with worsening shortness of breath.   Assessment & Plan    Acute Hypoxic Respiratory Failure  Acute on Chronic Combined CHF Bilateral Pleural Effusions BNP >4,500. Chest x-ray showed mild to moderate pleural effusion increased from 11/2021 and mild bilateral interstitial thickening slightly increased from 06/2021. Echo from 06/2021 showed LVEF of 25-30% with global hypokinesis and grade 2 diastolic  dysfunction. Started on IV Lasix. Net negative 1.3 L so far. Weight is 127 lbs today up 2 lbs form yesterday and up from 122 lbs at office visit in 11/2021. Renal function slightly improving with diuresis. - Diminshed breath sounds in bilateral  - Repeat Echo pending. - Continue IV Lasix 40mg  twice daily.  - GDMT limited due to borderline BP requiring Midodrine at home. Continue Toprol-XL 12.5mg  daily. No ACEi/ARB/ARNI or MRA due to BP and renal function. - May need to increase home Midodrine to so we can increase diuresis. - Patient may benefit from thoracentesis for pleural effusion. Will discuss with MD. - Consider adding an SGLT2 inhibitor prior to discharge. - Continue to monitor daily weights, strict I/Os, and renal function. - Of note, patient has declined ICD in the past.  CAD S/p PCI to RCA and LAD in 2016 and more recently successful PCI with DES x2 to LAD. - No angina. - Continue DAPT with Aspirin and Plavix. - Continue beta-blocker and statin.  Moderate MR Noted on last Echo in 06/2021.  - Updated Echo has been done but waiting for official read. Per Echo tech, MR looks to be more severe with mean gradient of 5 mmHg. Will wait for formal read.  Hypotension Previously had problems with hypertension but looks like more recently has had issues with hypotension and is on low dose Midodrine at home.  - BP stable today. - Continue Toprol-XL 12.5mg  daily. - Continue home Midodrine 2.5mg  three times daily.  Hyperlipidemia Lipid panel in 05/2021: Total Cholesterol 176, Triglycerides 285, HDL 48, LDL 81. LDL goal <70. - Continue Crestor 40mg  daily. - Will repeat fasting lipid panel tomorrow morning. If LDL above goal, consider adding Zetia.  Acute on CKD Stage III Creatinine 1.93 on admission and 1.85 today. Baseline around 0.9 to 1.3. - Continue to monitor with diuresis.  Otherwise, per primary team: - Iron deficiency anemia - COPD - Chronic back pain   For questions or  updates, please contact [place] Please consult [email or web address] for contact info under        Signed, Darreld Mclean, PA-C  01/01/2022, 10:18 AM    Patient seen and examined   I agree with findings as noted above by C Goodrich Pt appears better today from a volume standpoint   Still with some volume increase though Neck  JVP not elevated while sitting  Lungs   Rales at bases Cardiac   RRR   No S3    Ext are with tr to  1+ edema   Echo today shows LVEF less that 20%   In comparison to echo done in April, I actually think the LVEF was worse than what was  reported at that time    The ventricle is more dilated now  And,  MR is severe  Continue diuresis   Note patient has refused ICD in the past   Not sure that she would be a candidate for other advanced therapies   Dorris Carnes MD

## 2022-01-01 NOTE — Progress Notes (Signed)
   01/01/22 1642  Mobility  Activity Ambulated with assistance to bathroom  Level of Assistance Standby assist, set-up cues, supervision of patient - no hands on  Assistive Device Front wheel walker  Distance Ambulated (ft) 10 ft  Activity Response Tolerated well  Mobility Referral Yes  $Mobility charge 1 Mobility   Mobility Specialist Progress Note  Pt requesting to go to the BR. Had no c/o pain throughout ambulation. Returned to bed w/ all needs met and call bell in reach.  [name] Mobility Specialist

## 2022-01-01 NOTE — Progress Notes (Signed)
ANTICOAGULATION CONSULT NOTE - Follow Up Consult  Pharmacy Consult for heparin Indication:  suspected PE  Labs: Recent Labs    12/31/21 1119 12/31/21 1319 01/01/22 0049  HGB 14.9  --  14.3  HCT 46.3*  --  44.2  PLT 182  --  174  HEPARINUNFRC  --   --  1.09*  CREATININE 1.93*  --   --   TROPONINIHS 58* 42*  --     Assessment: 81yo female supratherapeutic on heparin with initial dosing for possible PE; no infusion issues or signs of bleeding per RN.  Goal of Therapy:  Heparin level 0.3-0.7 units/ml   Plan:  Will decrease heparin infusion by 3 units/kg/hr to 850 units/hr and check level in 8 hours.    Wynona Neat, PharmD, BCPS  01/01/2022,2:28 AM

## 2022-01-01 NOTE — Addendum Note (Signed)
Encounter addended by: Alvis Lemmings, RN on: 01/01/2022 6:32 PM  Actions taken: Care Plan modified

## 2022-01-01 NOTE — Progress Notes (Addendum)
PROGRESS NOTE    DONELL SLIWINSKI  UXN:[id number] DOB: 04-Jul-1940 DOA: 12/31/2021 PCP: Carollee Leitz, MD  BERDINA CHEEVER is a 81 y.o. female with medical history significant of chronic systolic heart failure, CAD s/p stent placement, moderate to severe MR, COPD, LBBB paralysis of vocal cords and larynx, essential hypertension, hyperlipidemia who presents to the emergency department due to several weeks onset of  worsening shortness of breath and increasing work of breathing., with ankle and abd wall edema In the ED O2 sats dropped o upper 80s, troponin x2 -58 > 42, D-dimer 1.84, BNP > 4,500.Marland Kitchen CXE w.Mild-to-moderate bilateral pleural effusion, bilateral interstitial thickening ? interstitial pulmonary edema.  Subjective: Anxious about getting her a.m. meds yet Wants to go home, breathing not back to baseline   Assessment and Plan:  Acute exacerbation of combined systolic and diastolic CHF Acute hypoxic resp failure -Echo 4/23 with EF of 25%, grade 2 diastolic dysfunction, RV function is low normal, moderate MR -GDMT previously challenging with low blood pressure on midodrine and torsemide at baseline -Continue IV Lasix today she is 1 L negative -Echo ordered by admitting MD will follow-up -Crea been higher than baseline, 1.9 now  -Further GDMT as BP and kidney function tolerates, following, may need to stop metoprolol   Elevated D-dimer VQ scan ordered by admitting MD will follow-up, clinically do not suspect PE will discontinue IV heparin   Acute kidney injury superimposed on CKD stage IV BUN/creatinine 35/1.93 (baseline creatinine at 0.9-1.0) Renally adjust medications, avoid hypotension   Elevated MCV Iron deficiency anemia Continue ferrous sulfate, B12 and folate are normal   Elevated troponin possibly secondary to type II demand ischemia Troponin x 2 - 58 > 42   CAD s/p stent placement Mildly elevated troponin, secondary to demand, no ACS Continue aspirin, Crestor, Toprol-XL    COPD Continue albuterol and Atrovent per home regimen  Chronic back pain Narcotic dependence -Home meds continued   Mixed hyperlipidemia Continue Crestor   DVT prophylaxis: Lovenox, will DC IV heparin   Code Status: Full code Family Communication: Spouse at bedside Disposition Plan: Home likely 48 hours  Consultants: Cards   Procedures:   Antimicrobials:    Objective: Vitals:   12/31/21 1721 12/31/21 1957 12/31/21 2343 01/01/22 0458  BP: 132/82 120/69 123/68 (!) 157/67  Pulse: 84 82 78 91  Resp: 19 20 (!) 22 (!) 25  Temp: 97.9 F (36.6 C) 97.7 F (36.5 C) 98 F (36.7 C) 97.7 F (36.5 C)  TempSrc: Axillary Oral Oral Oral  SpO2: 99% 98% 98% 99%  Weight:    57.7 kg  Height:        Intake/Output Summary (Last 24 hours) at 01/01/2022 0535 Last data filed at 01/01/2022 0506 Gross per 24 hour  Intake 288.24 ml  Output 1250 ml  Net -961.76 ml   Filed Weights   12/31/21 1053 01/01/22 0458  Weight: 56.7 kg 57.7 kg    Examination:  General exam: Thin chronically ill female anxious, AAOx3 HEENT: Positive JVD CVS: S1-S2, regular rhythm Lungs: Fine bilateral rales Abdomen: Soft, nontender, bowel sounds present Extremities: 1-2+ edema bilaterally  Skin: No rashes Psychiatry: Anxious this    Data Reviewed:   CBC: Recent Labs  Lab 12/31/21 1119 01/01/22 0049  WBC 8.0 8.1  NEUTROABS 5.7  --   HGB 14.9 14.3  HCT 46.3* 44.2  MCV 102.2* 100.2*  PLT 182 427   Basic Metabolic Panel: Recent Labs  Lab 12/31/21 1119 01/01/22 0049  NA 142 144  K 4.1 3.5  CL 100 100  CO2 25 27  GLUCOSE 108* 111*  BUN 35* 31*  CREATININE 1.93* 1.85*  CALCIUM 9.6 9.7   GFR: Estimated Creatinine Clearance: 19.3 mL/min (A) (by C-G formula based on SCr of 1.85 mg/dL (H)). Liver Function Tests: Recent Labs  Lab 12/31/21 1119 01/01/22 0049  AST 37 29  ALT 26 29  ALKPHOS 62 62  BILITOT 0.7 0.9  PROT 6.7 6.5  ALBUMIN 3.9 3.8   Recent Labs  Lab 12/31/21 1119   LIPASE 52*   No results for input(s): "AMMONIA" in the last 168 hours. Coagulation Profile: No results for input(s): "INR", "PROTIME" in the last 168 hours. Cardiac Enzymes: No results for input(s): "CKTOTAL", "CKMB", "CKMBINDEX", "TROPONINI" in the last 168 hours. BNP (last 3 results) No results for input(s): "PROBNP" in the last 8760 hours. HbA1C: No results for input(s): "HGBA1C" in the last 72 hours. CBG: No results for input(s): "GLUCAP" in the last 168 hours. Lipid Profile: No results for input(s): "CHOL", "HDL", "LDLCALC", "TRIG", "CHOLHDL", "LDLDIRECT" in the last 72 hours. Thyroid Function Tests: No results for input(s): "TSH", "T4TOTAL", "FREET4", "T3FREE", "THYROIDAB" in the last 72 hours. Anemia Panel: Recent Labs    01/01/22 0049  VITAMINB12 1,507*  FOLATE >40.0   Urine analysis:    Component Value Date/Time   COLORURINE YELLOW (A) 06/09/2021 0240   APPEARANCEUR HAZY (A) 06/09/2021 0240   APPEARANCEUR Clear 06/21/2020 1100   LABSPEC 1.023 06/09/2021 0240   LABSPEC 1.019 01/02/2014 1827   PHURINE 5.0 06/09/2021 0240   GLUCOSEU NEGATIVE 06/09/2021 0240   GLUCOSEU Negative 01/02/2014 1827   HGBUR SMALL (A) 06/09/2021 0240   BILIRUBINUR NEGATIVE 06/09/2021 0240   BILIRUBINUR Negative 06/21/2020 1100   BILIRUBINUR Negative 01/02/2014 1827   KETONESUR NEGATIVE 06/09/2021 0240   PROTEINUR 100 (A) 06/09/2021 0240   NITRITE NEGATIVE 06/09/2021 0240   LEUKOCYTESUR SMALL (A) 06/09/2021 0240   LEUKOCYTESUR 1+ 01/02/2014 1827   Sepsis Labs: @LABRCNTIP (procalcitonin:4,lacticidven:4)  ) Recent Results (from the past 240 hour(s))  SARS Coronavirus 2 by RT PCR (hospital order, performed in [place] hospital lab) *cepheid single result test* Anterior Nasal Swab     Status: None   Collection Time: 12/31/21  1:42 PM   Specimen: Anterior Nasal Swab  Result Value Ref Range Status   SARS Coronavirus 2 by RT PCR NEGATIVE NEGATIVE Final    Comment: (NOTE) SARS-CoV-2  target nucleic acids are NOT DETECTED.  The SARS-CoV-2 RNA is generally detectable in upper and lower respiratory specimens during the acute phase of infection. The lowest concentration of SARS-CoV-2 viral copies this assay can detect is 250 copies / mL. A negative result does not preclude SARS-CoV-2 infection and should not be used as the sole basis for treatment or other patient management decisions.  A negative result may occur with improper specimen collection / handling, submission of specimen other than nasopharyngeal swab, presence of viral mutation(s) within the areas targeted by this assay, and inadequate number of viral copies (<250 copies / mL). A negative result must be combined with clinical observations, patient history, and epidemiological information.  Fact Sheet for Patients:   https://www.patel.info/  Fact Sheet for Healthcare Providers: https://hall.com/  This test is not yet approved or  cleared by the [place] FDA and has been authorized for detection and/or diagnosis of SARS-CoV-2 by FDA under an Emergency Use Authorization (EUA).  This EUA will remain in effect (meaning this test can be used) for the duration of the  COVID-19 declaration under Section 564(b)(1) of the Act, 21 U.S.C. section 360bbb-3(b)(1), unless the authorization is terminated or revoked sooner.  Performed at [facility], [address], [place], [place] [address]      Radiology Studies: DG Chest 2 View  Result Date: 12/31/2021 CLINICAL DATA:  Shortness of breath. Anemia. Leg swelling for several days. History of CHF. EXAM: CHEST - 2 VIEW COMPARISON:  Chest two views 11/13/2021, AP chest 06/08/2021, AP chest 06/05/2021. FINDINGS: Cardiac silhouette is again markedly enlarged. Mediastinal contours are within normal limits with moderate calcification within the aortic arch. Mild-to-moderate bilateral pleural effusions are increased from  the most recent 11/13/2021 comparison chest radiographs. Mild bilateral interstitial thickening is slightly increased from 06/05/2021 and may represent mild interstitial pulmonary edema. No pneumothorax is seen. Old healed posterior right sixth rib fracture. Moderate multilevel degenerative disc changes of the visualized thoracic and lumbar spine. Moderate height loss of multiple midthoracic vertebral bodies, grossly similar to 06/05/2021 CT. Other plasty cement is again seen within the T12 vertebral body. IMPRESSION: 1. Mild-to-moderate bilateral pleural effusions, increased from 11/13/2021. 2. Mild bilateral interstitial thickening is slightly increased from 06/05/2021 and may represent mild interstitial pulmonary edema. Electronically Signed   By: Yvonne Kendall M.D.   On: 12/31/2021 11:58     Scheduled Meds:  aspirin  81 mg Oral Daily   ferrous sulfate  325 mg Oral Q breakfast   furosemide  40 mg Intravenous Q12H   ipratropium  2 spray Each Nare QID   metoprolol succinate  12.5 mg Oral Daily   midodrine  2.5 mg Oral TID WC   rosuvastatin  40 mg Oral Daily   Continuous Infusions:  heparin 850 Units/hr (01/01/22 0403)     LOS: 1 day    Time spent: 22min    Domenic Polite, MD [facility]   01/01/2022, 5:35 AM

## 2022-01-01 NOTE — Progress Notes (Signed)
Echocardiogram not complete due to personal care. Will re-attempt as the schedule permits.   Niley Helbig RDCS 

## 2022-01-02 ENCOUNTER — Encounter (HOSPITAL_COMMUNITY): Payer: Self-pay | Admitting: Internal Medicine

## 2022-01-02 ENCOUNTER — Encounter (HOSPITAL_COMMUNITY)
Admission: EM | Disposition: A | Discharge status: Home / Self Care | Payer: Self-pay | Source: Home / Self Care | Attending: Internal Medicine

## 2022-01-02 DIAGNOSIS — I5043 Acute on chronic combined systolic (congestive) and diastolic (congestive) heart failure: Secondary | ICD-10-CM | POA: Diagnosis not present

## 2022-01-02 DIAGNOSIS — I5023 Acute on chronic systolic (congestive) heart failure: Secondary | ICD-10-CM

## 2022-01-02 DIAGNOSIS — N179 Acute kidney failure, unspecified: Secondary | ICD-10-CM | POA: Diagnosis not present

## 2022-01-02 DIAGNOSIS — I447 Left bundle-branch block, unspecified: Secondary | ICD-10-CM | POA: Diagnosis not present

## 2022-01-02 DIAGNOSIS — I255 Ischemic cardiomyopathy: Secondary | ICD-10-CM

## 2022-01-02 DIAGNOSIS — J449 Chronic obstructive pulmonary disease, unspecified: Secondary | ICD-10-CM | POA: Diagnosis not present

## 2022-01-02 DIAGNOSIS — I2510 Atherosclerotic heart disease of native coronary artery without angina pectoris: Secondary | ICD-10-CM | POA: Diagnosis not present

## 2022-01-02 HISTORY — PX: BIV PACEMAKER INSERTION CRT-P: EP1199

## 2022-01-02 HISTORY — PX: PACEMAKER IMPLANT: EP1218

## 2022-01-02 LAB — BASIC METABOLIC PANEL
Anion gap: 12 (ref 5–15)
BUN: 24 mg/dL — ABNORMAL HIGH (ref 8–23)
CO2: 26 mmol/L (ref 22–32)
Calcium: 9.2 mg/dL (ref 8.9–10.3)
Chloride: 104 mmol/L (ref 98–111)
Creatinine, Ser: 1.67 mg/dL — ABNORMAL HIGH (ref 0.44–1.00)
GFR, Estimated: 31 mL/min — ABNORMAL LOW (ref >60)
Glucose, Bld: 99 mg/dL (ref 70–99)
Potassium: 3.3 mmol/L — ABNORMAL LOW (ref 3.5–5.1)
Sodium: 142 mmol/L (ref 135–145)

## 2022-01-02 LAB — LIPID PANEL
Cholesterol: 101 mg/dL (ref 0–200)
HDL: 40 mg/dL — ABNORMAL LOW (ref >40)
LDL Cholesterol: 42 mg/dL (ref 0–99)
Total CHOL/HDL Ratio: 2.5 RATIO
Triglycerides: 93 mg/dL (ref <150)
VLDL: 19 mg/dL (ref 0–40)

## 2022-01-02 LAB — SURGICAL PCR SCREEN
MRSA, PCR: NEGATIVE
Staphylococcus aureus: NEGATIVE

## 2022-01-02 SURGERY — BIV PACEMAKER INSERTION CRT-P

## 2022-01-02 MED ORDER — SODIUM CHLORIDE 0.9 % IV SOLN
80.0000 mg | INTRAVENOUS | Status: AC
  Administered 2022-01-02: 80 mg
  Filled 2022-01-02: qty 2

## 2022-01-02 MED ORDER — FENTANYL CITRATE (PF) 100 MCG/2ML IJ SOLN
INTRAMUSCULAR | Status: DC | PRN
  Administered 2022-01-02: 12.5 ug via INTRAVENOUS
  Administered 2022-01-02: 25 ug via INTRAVENOUS
  Administered 2022-01-02 (×2): 12.5 ug via INTRAVENOUS

## 2022-01-02 MED ORDER — CHLORHEXIDINE GLUCONATE 4 % EX LIQD
60.0000 mL | Freq: Once | CUTANEOUS | Status: DC
  Filled 2022-01-02: qty 60

## 2022-01-02 MED ORDER — OXYCODONE HCL 5 MG PO TABS: 5.0000 mg | ORAL_TABLET | Freq: Two times a day (BID) | ORAL | Status: DC | PRN

## 2022-01-02 MED ORDER — CHLORHEXIDINE GLUCONATE 4 % EX LIQD
60.0000 mL | Freq: Once | CUTANEOUS | Status: AC
  Administered 2022-01-02: 4 via TOPICAL
  Filled 2022-01-02: qty 60

## 2022-01-02 MED ORDER — CEFAZOLIN SODIUM-DEXTROSE 1-4 GM/50ML-% IV SOLN
1.0000 g | Freq: Three times a day (TID) | INTRAVENOUS | Status: AC
  Administered 2022-01-02 – 2022-01-03 (×3): 1 g via INTRAVENOUS
  Filled 2022-01-02 (×3): qty 50

## 2022-01-02 MED ORDER — ACETAMINOPHEN 325 MG PO TABS
325.0000 mg | ORAL_TABLET | ORAL | Status: DC | PRN
  Administered 2022-01-03: 325 mg via ORAL
  Filled 2022-01-02: qty 2

## 2022-01-02 MED ORDER — FENTANYL CITRATE (PF) 100 MCG/2ML IJ SOLN
INTRAMUSCULAR | Status: AC
  Filled 2022-01-02: qty 2

## 2022-01-02 MED ORDER — IODIXANOL 320 MG/ML IV SOLN
INTRAVENOUS | Status: DC | PRN
  Administered 2022-01-02: 30 mL

## 2022-01-02 MED ORDER — LIDOCAINE HCL (PF) 1 % IJ SOLN
INTRAMUSCULAR | Status: DC | PRN
  Administered 2022-01-02: 60 mL

## 2022-01-02 MED ORDER — MUPIROCIN 2 % EX OINT
TOPICAL_OINTMENT | Freq: Two times a day (BID) | CUTANEOUS | Status: DC
  Filled 2022-01-02 (×2): qty 22

## 2022-01-02 MED ORDER — LIDOCAINE HCL (PF) 1 % IJ SOLN
INTRAMUSCULAR | Status: AC
  Filled 2022-01-02: qty 30

## 2022-01-02 MED ORDER — CEFAZOLIN SODIUM-DEXTROSE 2-4 GM/100ML-% IV SOLN
INTRAVENOUS | Status: AC
  Filled 2022-01-02: qty 100

## 2022-01-02 MED ORDER — TORSEMIDE 20 MG PO TABS
20.0000 mg | ORAL_TABLET | Freq: Every day | ORAL | Status: DC
  Administered 2022-01-03: 20 mg via ORAL
  Filled 2022-01-02: qty 1

## 2022-01-02 MED ORDER — HEPARIN (PORCINE) IN NACL 1000-0.9 UT/500ML-% IV SOLN
INTRAVENOUS | Status: DC | PRN
  Administered 2022-01-02: 500 mL

## 2022-01-02 MED ORDER — SODIUM CHLORIDE 0.9 % IV SOLN
INTRAVENOUS | Status: AC
  Filled 2022-01-02: qty 2

## 2022-01-02 MED ORDER — SODIUM CHLORIDE 0.9 % IV SOLN: INTRAVENOUS | Status: DC

## 2022-01-02 MED ORDER — POTASSIUM CHLORIDE CRYS ER 20 MEQ PO TBCR: 40.0000 meq | EXTENDED_RELEASE_TABLET | Freq: Once | ORAL | Status: DC

## 2022-01-02 MED ORDER — MIDAZOLAM HCL 5 MG/5ML IJ SOLN
INTRAMUSCULAR | Status: DC | PRN
  Administered 2022-01-02 (×4): 1 mg via INTRAVENOUS

## 2022-01-02 MED ORDER — CEFAZOLIN SODIUM-DEXTROSE 2-4 GM/100ML-% IV SOLN
2.0000 g | INTRAVENOUS | Status: AC
  Administered 2022-01-02: 2 g via INTRAVENOUS
  Filled 2022-01-02: qty 100

## 2022-01-02 MED ORDER — MIDAZOLAM HCL 5 MG/5ML IJ SOLN
INTRAMUSCULAR | Status: AC
  Filled 2022-01-02: qty 5

## 2022-01-02 MED ORDER — HEPARIN (PORCINE) IN NACL 1000-0.9 UT/500ML-% IV SOLN
INTRAVENOUS | Status: AC
  Filled 2022-01-02: qty 500

## 2022-01-02 MED ORDER — CITALOPRAM HYDROBROMIDE 10 MG PO TABS: 10.0000 mg | ORAL_TABLET | Freq: Every day | ORAL | Status: DC

## 2022-01-02 MED ORDER — SODIUM CHLORIDE 0.9 % IV SOLN: INTRAVENOUS | Status: DC | PRN

## 2022-01-02 SURGICAL SUPPLY — 20 items
CABLE SURGICAL S-101-97-12 (CABLE) ×2 IMPLANT
CATH CPS DIRECT 135 DS2C020 (CATHETERS) IMPLANT
CATH CPS LOCATOR 3D MED (CATHETERS) IMPLANT
GUIDEWIRE ANGLED .035X150CM (WIRE) IMPLANT
HELIX LOCKING TOOL (MISCELLANEOUS) ×1
LEAD QUARTET 1458Q-86CM (Lead) IMPLANT
LEAD ULTIPACE 52 LPA1231/52 (Lead) IMPLANT
LEAD ULTIPACE 65 LPA1231/65 (Lead) IMPLANT
MAT PREVALON FULL STRYKER (MISCELLANEOUS) IMPLANT
PACEMAKER QUDR ALLR CRT PM3562 (Pacemaker) IMPLANT
PAD DEFIB RADIO PHYSIO CONN (PAD) ×2 IMPLANT
PMKR QUADRA ALLURE CRT PM3562 (Pacemaker) ×1 IMPLANT
SHEATH 7FR PRELUDE SNAP 13 (SHEATH) IMPLANT
SHEATH 9.5FR PRELUDE SNAP 13 (SHEATH) IMPLANT
SHEATH 9FR PRELUDE SNAP 13 (SHEATH) IMPLANT
SLITTER AGILIS HISPRO (INSTRUMENTS) IMPLANT
TOOL HELIX LOCKING (MISCELLANEOUS) IMPLANT
TRAY PACEMAKER INSERTION (PACKS) ×2 IMPLANT
WIRE ACUITY WHISPER EDS 4648 (WIRE) IMPLANT
WIRE MICRO SET 5FR 12 (WIRE) IMPLANT

## 2022-01-02 NOTE — Progress Notes (Signed)
   Heart Failure Stewardship Pharmacist Progress Note   PCP: Carollee Leitz, MD PCP-Cardiologist: Ida Rogue, MD    HPI:  81 yo F with PMH of CHF, CAD, COPD, LBBB, paralysis of vocal cords, HTN, and HLD.  Last admitted in 06/2021 after a fall from hip fracture. Postop course complicated by volume overload. ECHO at that time with EF 25-30%. R/LHC showed severe proximal and mid LAD stenosis s/p PCI with 2 DES, patent RCA stents, and mildly elevated filling pressures with mildly reduced cardiac output (RA 7, PA 30, wedge 22, CO 3.3, CI 2.0). GDMT limited by hypotension requiring midodrine at discharge.  Presented to the ED on 10/30 with shortness of breath, fatigue, and LE edema. CXR with mild to moderate bilateral pleural effusions and mild interstitial pulmonary edema. BNP >4500. ECHO 10/31 showed LVEF <20%, global hypokinesis, mild LVH, RV mildly reduced. Prior ECHO 06/2021 with LVEF 25-30%.  Current HF Medications: Diuretic: furosemide 40 mg IV BID Beta Blocker: metoprolol XL 12.5 mg daily *also on midodrine 2.5 mg TID  Prior to admission HF Medications: Diuretic: torsemide 20 mg daily (except Sundays) Beta blocker: metoprolol XL 12.5 mg daily *also on midodrine 2.5 mg TID  Pertinent Lab Values: Serum creatinine 1.85, BUN 31, Potassium 3.5, Sodium 144, BNP >4500  Vital Signs: Weight: 127 lbs (admission weight: estimated 125 lbs) Blood pressure: 120/70s  Heart rate: 90s  I/O: -1L yesterday; net -1.1L  Medication Assistance / Insurance Benefits Check: Does the patient have prescription insurance?  Yes Type of insurance plan: [place] [employer]  Outpatient Pharmacy:  Prior to admission outpatient pharmacy: [facility] Is the patient willing to use [facility] at discharge? Yes Is the patient willing to transition their outpatient pharmacy to utilize a [facility] outpatient pharmacy?   Pending   Assessment: 1. Acute on chronic systolic CHF (LVEF <61%), due to ICM. NYHA class III  symptoms. - Continue furosemide 40 mg IV BID. Strict I/Os and daily weights. Recommend KCl 40 mEq x 1 to keep K>4. Check magnesium in AM. - Continue metoprolol XL 12.5 mg daily - BP limiting ACE/ARB/ARNI and MRA - Consider adding SGLT2i prior to discharge - Continue midodrine 2.5 mg TID to allow for aggressive diuresis   Plan: 1) Medication changes recommended at this time: - Continue IV diuresis  2) Patient assistance: - Farxiga copay $47 - Jardiance copay $47  3)  Education  - To be completed prior to discharge  Kerby Nora, PharmD, BCPS Heart Failure Stewardship Pharmacist Phone [phone]

## 2022-01-02 NOTE — Assessment & Plan Note (Addendum)
No clinical signs of exacerbation Oxymetry on ambulation on room air 92%  Patient with vocal cord paralysis.

## 2022-01-02 NOTE — Assessment & Plan Note (Addendum)
Cell count has been stable.  Continue with oral iron supplementation.   Mild d dimer elevation with low pre test probability for pulmonary embolism,  [place] lower extremities was negative for DVT on 11/2021  Patient refused V/Q scan  With low GFR with avoid IV contrast, chest radiograph with pulmonary edema on admission.

## 2022-01-02 NOTE — Progress Notes (Signed)
Patient anxious during shift assessment. Patient upset with husband whom at bedside. RN offered prn xanax and patient refused. RN turn off lights, call bell within reach.

## 2022-01-02 NOTE — Assessment & Plan Note (Signed)
CKD stage 3b. Hypokalemia  Renal function with serum cr at 1,67, with K at 3,3 and serum bicarbonate at 26. Plan to continue diuresis with furosemide and add 40 meq Kcl  Follow up renal function and electrolytes in am.

## 2022-01-02 NOTE — Progress Notes (Signed)
Progress Note   Patient: [name] OFB:[id number] DOB: 1940/09/06 DOA: 12/31/2021     2 DOS: the patient was seen and examined on 01/02/2022   Brief hospital course: [name] was admitted to the hospital with the working diagnosis of heart failure exacerbation.   81 yo female with the past medical history of heart failure, coronary artery disease, moderate to severe MR, COPD and hypertension who presented with lower extremity edema. Patient reported several weeks of worsening lower extremity edema along with decreased mobility and dyspnea on exertion. On her initial physical examination her blood pressure was 121/73, HR 87, RR 17 and 02 saturation 91% on room air. Lungs with bilateral rales, heart with S1 and S2 present and rhythmic, abdomen with no distention and positive bilateral lower extremity edema.   Na 142, K 4,1 Cl 100 bicarbonate 25 glucose 108 bun 35 cr 1,93 BNP >4,500 High sensitive troponin 58 and 42  Wbc 8,0 hgb 14,9 plt 182   Chest radiograph with cardiomegaly, bilateral hilar vascular congestion and small bilateral pleural effusions.   EKG 86 bpm, normal axis, left bundle branch block, qtc 531, sinus rhythm, J point elevation V1 and V2 with no significant ST segment or T wave changes. Poor R R wave progression.   Patient was placed on furosemide for diuresis and midodrine for blood pressure support.  EP was consulted for possible biventricular pacing.     Assessment and Plan: * Acute on chronic systolic CHF (congestive heart failure) (HCC) Echocardiogram with decreased LV systolic function <78%, global hypokinesis, severe dilatated internal cavity, asymmetric left ventricular hypertrophy, RV systolic function with mild reduction, RVSP 55.7 severe dilatation LA, small pericardial effusion, severe mitral regurgitation (rheumatic), mild to moderate TR, mild to moderate AI.   Urine output is 1,550 ml Continue volume overloaded.  Systolic blood pressure 242 to 119 mmHg.    Diuresis with furosemide 40 mg bid Midodrine for blood pressure support.  B blocker with metoprolol.  Plan for bi ventricular pacing today.    Acute kidney injury superimposed on chronic kidney disease (HCC) CKD stage 3b. Hypokalemia  Renal function with serum cr at 1,67, with K at 3,3 and serum bicarbonate at 26. Plan to continue diuresis with furosemide and add 40 meq Kcl  Follow up renal function and electrolytes in am.    CAD (coronary artery disease) No chest pain or angina.  Continue with aspirin and rosuvastatin.   COPD (chronic obstructive pulmonary disease) (HCC) No clinical signs of exacerbation Continue oxymetry  Monitoring Patient with vocal cord paralysis.   Iron deficiency anemia Cell count has been stable.  Continue with oral iron supplementation.   Mixed hyperlipidemia Continue statin therapy.         Subjective: Patient with improvement in her symptoms but not back to baseline, no chest pain, improved edema but not back to baseline   Physical Exam: Vitals:   01/02/22 0510 01/02/22 0820 01/02/22 0842 01/02/22 0844  BP: 119/73 116/64  116/87  Pulse: 90 100  98  Resp: 20 (!) 29  (!) 25  Temp: 97.9 F (36.6 C) 97.7 F (36.5 C) 97.7 F (36.5 C)   TempSrc: Oral Oral Oral   SpO2: 98% 99%  100%  Weight:      Height:       Neurology awake and alert ENT with mild pallor Cardiovascular with S1 and S2 present and rhythmic, positive systolic murmur at the apex, 3/6 no rubs or gallops Respiratory with rales at bases with no  wheezing or rhonchi Abdomen with no distention  Positive lower extremity edema + Data Reviewed:    Family Communication: I spoke with patient's husband at the bedside, we talked in detail about patient's condition, plan of care and prognosis and all questions were addressed.   Disposition: Status is: Inpatient Remains inpatient appropriate because: biventricular pacing   Planned Discharge Destination:  Home  Author: Tawni Millers, MD 01/02/2022 1:12 PM  For on call review [email or web address].

## 2022-01-02 NOTE — TOC Progression Note (Addendum)
Transition of Care [facility]) - Progression Note    Patient Details  Name: [name] MRN: [id number] Date of Birth: [birth year]  [facility]) CM/SW Contact  Zenon Mayo, RN Phone Number: 01/02/2022, 7:32 PM  Clinical Narrative:    CHF IV lasix, vocal cord paralized, home with spouse, , TOC following.        Expected Discharge Plan and Services                                                 Social Determinants of Health (SDOH) Interventions Food Insecurity Interventions: Intervention Not Indicated Housing Interventions: Intervention Not Indicated Transportation Interventions: Intervention Not Indicated Alcohol Usage Interventions: Intervention Not Indicated (Score <7) Financial Strain Interventions: Intervention Not Indicated  Readmission Risk Interventions     No data to display

## 2022-01-02 NOTE — Interval H&P Note (Signed)
History and Physical Interval Note:  01/02/2022 1:53 PM  [name]  has presented today for surgery, with the diagnosis of chest pain.  The various methods of treatment have been discussed with the patient and family. After consideration of risks, benefits and other options for treatment, the patient has consented to  Procedure(s): BIV PACEMAKER INSERTION CRT-P (N/A) as a surgical intervention.  The patient's history has been reviewed, patient examined, no change in status, stable for surgery.  I have reviewed the patient's chart and labs.  Questions were answered to the patient's satisfaction.     [name]

## 2022-01-02 NOTE — Consult Note (Signed)
   [facility] Inpatient Consult   01/02/2022  [name] Mar 26, 1940 [id number]  [place] [facility] [ACO] Patient: [name]  Primary Care Provider:  Carollee Leitz, MD, with [place] [facility] PCP]   Patient screened for hospitalization with noted to assess for potential [facility] service needs for post hospital transition.  Patient discussed in progression rounds for potential needs.  Noted for ongoing treatment planned.    1:25 pm:  Patient getting prepared for a procedure briefly touched basis regarding her PCP, and she endorses with husband at the bedside, that the PCP is Carollee Leitz, MD at [facility].  Plan:  Continue to follow progress and disposition to assess for post hospital care management needs.    For questions contact:   Natividad Brood, RN BSN [facility] Liaison  [phone] business mobile phone Toll free office [phone]  Fax number: [phone] [email or web address]

## 2022-01-02 NOTE — Hospital Course (Addendum)
[name] was admitted to the hospital with the working diagnosis of heart failure exacerbation.   81 yo female with the past medical history of heart failure, coronary artery disease, moderate to severe MR, COPD and hypertension who presented with lower extremity edema. Patient reported several weeks of worsening lower extremity edema along with decreased mobility and dyspnea on exertion. On her initial physical examination her blood pressure was 121/73, HR 87, RR 17 and 02 saturation 91% on room air. Lungs with bilateral rales, heart with S1 and S2 present and rhythmic, abdomen with no distention and positive bilateral lower extremity edema.   Na 142, K 4,1 Cl 100 bicarbonate 25 glucose 108 bun 35 cr 1,93 BNP >4,500 High sensitive troponin 58 and 42  Wbc 8,0 hgb 14,9 plt 182   Chest radiograph with cardiomegaly, bilateral hilar vascular congestion and small bilateral pleural effusions.   EKG 86 bpm, normal axis, left bundle branch block, qtc 531, sinus rhythm, J point elevation V1 and V2 with no significant ST segment or T wave changes. Poor R R wave progression.   Patient was placed on furosemide for diuresis and midodrine for blood pressure support.  EP was consulted for possible biventricular pacing.   11/01 biventricular pacer implantation  11/03 plan to discharge home and follow up as outpatient.

## 2022-01-02 NOTE — Assessment & Plan Note (Addendum)
No chest pain or angina.  Continue with aspirin and rosuvastatin.  Holding clopidogrel for one week after procedure.

## 2022-01-02 NOTE — Consult Note (Addendum)
ELECTROPHYSIOLOGY CONSULT NOTE    Patient ID: [name] MRN: [id number], DOB/AGE: [birth year] 81 y.o.  Admit date: 12/31/2021 Date of Consult: 01/02/2022  Primary Physician: Carollee Leitz, MD Primary Cardiologist: Ida Rogue, MD  Electrophysiologist: New   Referring Provider: Dr. Harrington Challenger  Patient Profile: [name] is a 81 y.o. female with a history of ICM with chronic combined heart failure LVEF 25%, CAD with PCI to mid RCA and proximal LAD in 2016 (intolerant to brilinta), moderate to severe MR, CKD 3, LBBB, vocal cord paralysis, and hypotension who is being seen today for the evaluation of CHF and LBBB at the request of Dr. Harrington Challenger.  HPI:  [name] is a 81 y.o. female with medical history as above.   She presented to [facility] 12/31/21 with lower extremity swelling, worsening weakness, and vocal cord paralysis. She reported SOB and found to be volume up prompting cardiology consult.    Hb 14.9 sCr 1.93, baseline ?1.35 HS troponin 58 --> 42 (was in the 1900 range in April 2023) BNP > 4500 CXR with bilateral pleural effusions, pulmonary edema D-dimer 1.84  Potassium3.3* (11/01 0110) Creatinine, ser  1.67* (11/01 0110) PLT  174 (10/31 0049) HGB  14.3 (10/31 0049) WBC 8.1 (10/31 0049) Troponin I (High Sensitivity)42* (10/30 1319).    Has been diuresed with IV lasix, on-going. Has previously refused ICD, and GDMT has been limited by hypotension requiring midodrine.  Negative 1.7 L, though weight only down 0.5 lb.   This am she is feeling OK. She states she is able to lie back somewhat flat. She does not want an ICD, but she is willing to consider pacemaker (CRT) if it is felt it may help her symptoms. She understands this will not be likely to help her valve, and may take months to provide any noticeable benefity.   Past Medical History:  Diagnosis Date   Anxiety    Arthritis    CAD (coronary artery disease)    a. cardiac cath 10/2014: ostial LAD 30%, prox LAD 90% s/p  PCI/DES, ostial RCA 30%, mid RCA 90% s/p PCI/DES, ostial Ramus 50%   Chronic back pain    Chronic fatigue    Chronic systolic CHF (congestive heart failure) ([place])    a. 10/2014 Echo: EF 20%; b. 06/2017 Echo: EF 20-25%, diff HK, Gr2 DD, Mild AI. Mod to sev MR. Mod dil LA.   Hepatitis C    Ischemic cardiomyopathy    a. 10/2014 Echo: EF 20%; b. 06/2017 Echo: EF 20-25%.   LBBB (left bundle branch block)    Moderate to Severe Mitral regurgitation    a. 06/2017 Echo: Mod-Sev MR.   Osteoporosis    UTI (lower urinary tract infection)      Surgical History:  Past Surgical History:  Procedure Laterality Date   BACK SURGERY  07/14   T10, L1   CARDIAC CATHETERIZATION Bilateral 10/27/2014   Procedure: Left Heart Cath and Coronary Angiography;  Surgeon: Minna Merritts, MD;  Location: [place] [facility];  Service: Cardiovascular;  Laterality: Bilateral;   CARDIAC CATHETERIZATION N/A 10/27/2014   Procedure: Coronary Stent Intervention;  Surgeon: Wellington Hampshire, MD;  Location: [place] [facility];  Service: Cardiovascular;  Laterality: N/A;   CORONARY STENT INTERVENTION N/A 06/13/2021   Procedure: CORONARY STENT INTERVENTION;  Surgeon: Nelva Bush, MD;  Location: [place] [facility];  Service: Cardiovascular;  Laterality: N/A;   HIP ARTHROPLASTY Left 06/07/2021   Procedure: ARTHROPLASTY BIPOLAR HIP (HEMIARTHROPLASTY);  Surgeon:  Leim Fabry, MD;  Location: [facility];  Service: Orthopedics;  Laterality: Left;   INTRAVASCULAR PRESSURE WIRE/FFR STUDY N/A 06/13/2021   Procedure: INTRAVASCULAR PRESSURE WIRE/FFR STUDY;  Surgeon: Nelva Bush, MD;  Location: [place] [facility];  Service: Cardiovascular;  Laterality: N/A;   RIGHT/LEFT HEART CATH AND CORONARY ANGIOGRAPHY N/A 06/13/2021   Procedure: RIGHT/LEFT HEART CATH AND CORONARY ANGIOGRAPHY;  Surgeon: Nelva Bush, MD;  Location: [place] [facility];  Service: Cardiovascular;  Laterality: N/A;     Medications Prior to Admission  Medication  Sig Dispense Refill Last Dose   albuterol (VENTOLIN HFA) 108 (90 Base) MCG/ACT inhaler Inhale 2 puffs into the lungs every 6 (six) hours as needed for wheezing or shortness of breath.   Unknown   ALPRAZolam (XANAX) 0.25 MG tablet TAKE ONE TABLET BY MOUTH ONCE TO TWICE DAILY AS NEEDED (Patient taking differently: Take 0.25 mg by mouth 2 (two) times daily as needed for anxiety.) 60 tablet 5 12/30/2021   aspirin 81 MG chewable tablet Chew 1 tablet (81 mg total) by mouth daily.   12/31/2021   citalopram (CELEXA) 10 MG tablet Take 1 tablet (10 mg total) by mouth daily. 90 tablet 3 Unknown   denosumab (PROLIA) 60 MG/ML SOSY injection Inject 60 mg into the skin every 6 (six) months. 1 mL 2 Unknown   ipratropium (ATROVENT) 0.06 % nasal spray Place 2 sprays into both nostrils 4 (four) times daily. 15 mL 11 12/30/2021   metoprolol succinate (TOPROL-XL) 25 MG 24 hr tablet Take 0.5 tablets (12.5 mg total) by mouth daily. 45 tablet 1 12/31/2021 at 0700   midodrine (PROAMATINE) 2.5 MG tablet Take 1 tablet (2.5 mg total) by mouth 3 (three) times daily with meals. 270 tablet 3 12/31/2021   ondansetron (ZOFRAN-ODT) 4 MG disintegrating tablet TAKE 1 TABLET BY MOUTH TWICE A DAY AS NEEDED FOR NAUSEA AND VOMITING appt further refills no exceptions (Patient taking differently: Take 4 mg by mouth 2 (two) times daily as needed for nausea or vomiting.) 60 tablet 11 12/31/2021   oxyCODONE (OXY IR/ROXICODONE) 5 MG immediate release tablet Take 1 tablet (5 mg total) by mouth 2 (two) times daily as needed for moderate pain (pain score 4-6). 60 tablet 0 Past Week   rosuvastatin (CRESTOR) 40 MG tablet TAKE ONE TABLET BY MOUTH ONE TIME DAILY AT 6PM (Patient taking differently: Take 40 mg by mouth daily.) 90 tablet 0 12/31/2021   torsemide (DEMADEX) 20 MG tablet TAKE ONE TABLET BY MOUTH ONE TIME DAILY, EXCEPT DO NOT TAKE ON SUNDAYS (Patient taking differently: Take 20 mg by mouth once. EXCEPT DO NOT TAKE ON SUNDAYS) 90 tablet 0  12/31/2021   clopidogrel (PLAVIX) 75 MG tablet Take 1 tablet (75 mg total) by mouth daily. 90 tablet 1    FLUZONE HIGH-DOSE QUADRIVALENT 0.7 ML SUSY       PFIZER COVID-19 VAC BIVALENT injection  (Patient not taking: Reported on 08/17/2021)       Inpatient Medications:   aspirin  81 mg Oral Daily   citalopram  10 mg Oral Daily   enoxaparin (LOVENOX) injection  30 mg Subcutaneous Daily   ferrous sulfate  325 mg Oral Q breakfast   furosemide  40 mg Intravenous Q12H   ipratropium  2 spray Each Nare QID   metoprolol succinate  12.5 mg Oral Daily   midodrine  2.5 mg Oral TID WC   rosuvastatin  40 mg Oral Daily   senna-docusate  1 tablet Oral BID    Allergies:  Allergies  Allergen Reactions   Sugar-Protein-Starch Nausea And Vomiting    Only to sugar    Social History   Socioeconomic History   Marital status: Married    Spouse name: Marcelene Weidemann   Number of children: 0   Years of education: 14   Highest education level: Not on file  Occupational History   Occupation: Receptionist    Comment: Retired in [place] Use   Smoking status: Former    Packs/day: 1.00    Years: 10.00    Total pack years: 10.00    Types: Cigarettes   Smokeless tobacco: Never  Vaping Use   Vaping Use: Never used  Substance and Sexual Activity   Alcohol use: No    Alcohol/week: 0.0 standard drinks of alcohol   Drug use: No   Sexual activity: Yes    Birth control/protection: Post-menopausal  Other Topics Concern   Not on file  Social History Narrative   Pt is 81 yo female, married to husband of 21 years.  Pt has no children.  Pt lives with husband and teacup poodle, [name]. 89 y.o as of 01/01/19.  Pt states she hurt her back last year and has not been able to participate in much physical activity since. Pt enjoys traveling with her husband in motor home, doing crossword puzzles with husband, and playing cards with friends.  Pt was raised in [place] and has lived in [place] entire adult life.   Pt worked in [employer] in [place] until [place] when they closed.     Social Determinants of Health   Financial Resource Strain: Low Risk  (01/02/2022)   Overall Financial Resource Strain (CARDIA)    Difficulty of Paying Living Expenses: Not hard at all  Food Insecurity: No Food Insecurity (01/02/2022)   Hunger Vital Sign    Worried About Running Out of Food in the Last Year: Never true    Ran Out of Food in the Last Year: Never true  Transportation Needs: No Transportation Needs (01/02/2022)   PRAPARE - Hydrologist (Medical): No    Lack of Transportation (Non-Medical): No  Physical Activity: Insufficiently Active (03/01/2021)   Exercise Vital Sign    Days of Exercise per Week: 5 days    Minutes of Exercise per Session: 10 min  Stress: No Stress Concern Present (03/01/2021)   [place]    Feeling of Stress : Not at all  Social Connections: Unknown (03/01/2021)   Social Connection and Isolation Panel [NHANES]    Frequency of Communication with Friends and Family: Not on file    Frequency of Social Gatherings with Friends and Family: Not on file    Attends Religious Services: Not on file    Active Member of Clubs or Organizations: Not on file    Attends Archivist Meetings: Not on file    Marital Status: Married  Intimate Partner Violence: Not At Risk (03/01/2021)   Humiliation, Afraid, Rape, and Kick questionnaire    Fear of Current or Ex-Partner: No    Emotionally Abused: No    Physically Abused: No    Sexually Abused: No     Family History  Problem Relation Age of Onset   Arthritis Mother    Heart disease Father        passed at 72   Diabetes Father      Review of Systems: All other systems reviewed and are otherwise  negative except as noted above.  Physical Exam: Vitals:   01/02/22 0510 01/02/22 0820 01/02/22 0842 01/02/22 0844  BP: 119/73 116/64  116/87   Pulse: 90 100  98  Resp: 20 (!) 29  (!) 25  Temp: 97.9 F (36.6 C) 97.7 F (36.5 C) 97.7 F (36.5 C)   TempSrc: Oral Oral Oral   SpO2: 98% 99%  100%  Weight:      Height:        GEN- The patient is elderly appearing, alert and oriented x 3 today.  Hoarse HEENT: normocephalic, atraumatic; sclera clear, conjunctiva pink; hearing intact; oropharynx clear; neck supple Lungs- Diminished.  Heart- Regular rate and rhythm. Soft II/IV systolic apical murmur. GI- soft, non-tender, non-distended, bowel sounds present Extremities- no clubbing, cyanosis, or edema; DP/PT/radial pulses 2+ bilaterally MS- no significant deformity or atrophy Skin- warm and dry, no rash or lesion Psych- euthymic mood, full affect Neuro- strength and sensation are intact  Labs:   Lab Results  Component Value Date   WBC 8.1 01/01/2022   HGB 14.3 01/01/2022   HCT 44.2 01/01/2022   MCV 100.2 (H) 01/01/2022   PLT 174 01/01/2022    Recent Labs  Lab 01/01/22 0049 01/02/22 0110  NA 144 142  K 3.5 3.3*  CL 100 104  CO2 27 26  BUN 31* 24*  CREATININE 1.85* 1.67*  CALCIUM 9.7 9.2  PROT 6.5  --   BILITOT 0.9  --   ALKPHOS 62  --   ALT 29  --   AST 29  --   GLUCOSE 111* 99      Radiology/Studies: DG Chest 2 View  Result Date: 01/01/2022 CLINICAL DATA:  Pleural effusion EXAM: CHEST - 2 VIEW COMPARISON:  Chest x-ray 12/04/2021 FINDINGS: The heart is enlarged, unchanged. There are small bilateral pleural effusions and bibasilar infiltrates similar to prior. There is no evidence for pneumothorax or acute fracture. IMPRESSION: Stable cardiomegaly with small bilateral pleural effusions and bibasilar infiltrates. Electronically Signed   By: Ronney Asters M.D.   On: 01/01/2022 17:56   ECHOCARDIOGRAM COMPLETE  Result Date: 01/01/2022    ECHOCARDIOGRAM REPORT   Patient Name:   [name] Pendell Date of Exam: 01/01/2022 Medical Rec #:  [id number]     Height:       60.0 in Accession #:    [id number]    Weight:        127.1 lb Date of Birth:  03-29-1940     BSA:          1.539 m Patient Age:    41 years      BP:           123/70 mmHg Patient Gender: F             HR:           96 bpm. Exam Location:  Inpatient Procedure: 2D Echo, Cardiac Doppler, Color Doppler and Intracardiac            Opacification Agent Indications:    CHF  History:        Patient has prior history of Echocardiogram examinations, most                 recent 06/06/2021. Cardiomyopathy, CAD; Arrythmias:LBBB.  Sonographer:    Darlina Sicilian RDCS Referring Phys: [id number] OLADAPO ADEFESO IMPRESSIONS  1. Left ventricular ejection fraction, by estimation, is <20%. The left ventricle has severely decreased function. The left ventricle demonstrates global hypokinesis. The left ventricular  internal cavity size was severely dilated. There is mild asymmetric left ventricular hypertrophy. Left ventricular diastolic parameters are indeterminate. There is no LV thrombus.  2. Right ventricular systolic function is mildly reduced. The right ventricular size is normal. There is moderately elevated pulmonary artery systolic pressure. The estimated right ventricular systolic pressure is 01.7 mmHg.  3. Left atrial size was severely dilated.  4. A small pericardial effusion is present. The pericardial effusion is anterior to the right ventricle and posterior to the left ventricle.  5. 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 Severe mitral valve regurgitation. No evidence of mitral stenosis. The mean mitral valve gradient is 5.0 mmHg. Central ventricular MR with A2-P2 worst pathology and pulmonary vein flow reversal.  6. Tricuspid valve regurgitation is mild to moderate.  7. The aortic valve is tricuspid. There is mild thickening of the aortic valve. Aortic valve regurgitation is mild to moderate. No aortic stenosis is present. Comparison(s): Further decrease in LV size and function; increase in MR. FINDINGS  Left Ventricle: Left ventricular ejection fraction, by  estimation, is <20%. The left ventricle has severely decreased function. The left ventricle demonstrates global hypokinesis. Definity contrast agent was given IV to delineate the left ventricular endocardial borders. The left ventricular internal cavity size was severely dilated. There is mild asymmetric left ventricular hypertrophy. Left ventricular diastolic parameters are indeterminate. Right Ventricle: The right ventricular size is normal. No increase in right ventricular wall thickness. Right ventricular systolic function is mildly reduced. There is moderately elevated pulmonary artery systolic pressure. The tricuspid regurgitant velocity is 3.19 m/s, and with an assumed right atrial pressure of 15 mmHg, the estimated right ventricular systolic pressure is 49.4 mmHg. Left Atrium: Left atrial size was severely dilated. Right Atrium: Right atrial size was normal in size. Pericardium: A small pericardial effusion is present. The pericardial effusion is anterior to the right ventricle and posterior to the left ventricle. Mitral Valve: MVA 5.8 cm2 by PHT. The mitral valve is rheumatic. Severe mitral valve regurgitation. No evidence of mitral valve stenosis. The mean mitral valve gradient is 5.0 mmHg with average heart rate of 94 bpm. Tricuspid Valve: The tricuspid valve is grossly normal. Tricuspid valve regurgitation is mild to moderate. No evidence of tricuspid stenosis. Aortic Valve: The aortic valve is tricuspid. There is mild thickening of the aortic valve. Aortic valve regurgitation is mild to moderate. Aortic regurgitation PHT measures 248 msec. No aortic stenosis is present. Pulmonic Valve: The pulmonic valve was normal in structure. Pulmonic valve regurgitation is mild. No evidence of pulmonic stenosis. Aorta: The aortic root, ascending aorta and aortic arch are all structurally normal, with no evidence of dilitation or obstruction and the aortic root and ascending aorta are structurally normal, with no  evidence of dilitation. IAS/Shunts: No atrial level shunt detected by color flow Doppler.  LEFT VENTRICLE PLAX 2D LVIDd:         6.40 cm      Diastology LVIDs:         5.90 cm      LV e' medial:    7.88 cm/s LV PW:         0.90 cm      LV E/e' medial:  17.6 LV IVS:        1.10 cm      LV e' lateral:   12.45 cm/s LVOT diam:     1.60 cm      LV E/e' lateral: 11.2 LV SV:  21 LV SV Index:   13 LVOT Area:     2.01 cm  LV Volumes (MOD) LV vol d, MOD A2C: 375.0 ml LV vol d, MOD A4C: 277.0 ml LV vol s, MOD A2C: 290.0 ml LV vol s, MOD A4C: 277.0 ml LV SV MOD A2C:     85.0 ml LV SV MOD A4C:     277.0 ml LV SV MOD BP:      66.4 ml RIGHT VENTRICLE RV S prime:     5.33 cm/s LEFT ATRIUM              Index        RIGHT ATRIUM           Index LA diam:        5.00 cm  3.25 cm/m   RA Area:     11.00 cm LA Vol (A2C):   113.0 ml 73.40 ml/m  RA Volume:   21.50 ml  13.97 ml/m LA Vol (A4C):   115.0 ml 74.70 ml/m LA Biplane Vol: 117.0 ml 76.00 ml/m  AORTIC VALVE             PULMONIC VALVE LVOT Vmax:   54.90 cm/s  PR End Diast Vel: 8.41 msec LVOT Vmean:  40.400 cm/s RVOT Peak grad:   1 mmHg LVOT VTI:    0.103 m AI PHT:      248 msec  AORTA Ao Root diam: 2.70 cm Ao Asc diam:  3.00 cm MITRAL VALVE                  TRICUSPID VALVE MV Area (PHT): 5.64 cm       TR Peak grad:   40.7 mmHg MV Mean grad:  5.0 mmHg       TR Vmax:        319.00 cm/s MV Decel Time: 135 msec MR Peak grad:    104.0 mmHg   SHUNTS MR Mean grad:    70.0 mmHg    Systemic VTI:  0.10 m MR Vmax:         510.00 cm/s  Systemic Diam: 1.60 cm MR Vmean:        403.0 cm/s   Pulmonic VTI:  0.072 m MR PISA:         2.26 cm MR PISA Eff ROA: 18 mm MR PISA Radius:  0.60 cm MV E velocity: 139.00 cm/s Rudean Haskell MD Electronically signed by Rudean Haskell MD Signature Date/Time: 01/01/2022/11:23:40 AM    Final    DG Chest 2 View  Result Date: 12/31/2021 CLINICAL DATA:  Shortness of breath. Anemia. Leg swelling for several days. History of CHF. EXAM:  CHEST - 2 VIEW COMPARISON:  Chest two views 11/13/2021, AP chest 06/08/2021, AP chest 06/05/2021. FINDINGS: Cardiac silhouette is again markedly enlarged. Mediastinal contours are within normal limits with moderate calcification within the aortic arch. Mild-to-moderate bilateral pleural effusions are increased from the most recent 11/13/2021 comparison chest radiographs. Mild bilateral interstitial thickening is slightly increased from 06/05/2021 and may represent mild interstitial pulmonary edema. No pneumothorax is seen. Old healed posterior right sixth rib fracture. Moderate multilevel degenerative disc changes of the visualized thoracic and lumbar spine. Moderate height loss of multiple midthoracic vertebral bodies, grossly similar to 06/05/2021 CT. Other plasty cement is again seen within the T12 vertebral body. IMPRESSION: 1. Mild-to-moderate bilateral pleural effusions, increased from 11/13/2021. 2. Mild bilateral interstitial thickening is slightly increased from 06/05/2021 and may represent mild interstitial pulmonary edema. Electronically Signed  By: Yvonne Kendall M.D.   On: 12/31/2021 11:58      Cardiac Studies & Procedures   CARDIAC CATHETERIZATION  CARDIAC CATHETERIZATION 06/14/2021  Narrative Conclusions: Severe single-vessel coronary artery disease with sequential 80-90% proximal/mid LAD stenoses flanking previously placed, which are highly significant by iFR and involve D1 and D2. Mild-moderate, nonobstructive CAD involving ramus intermedius and RCA. Widely patent mid LAD and mid RCA stents. Mildly elevated left and right heart filling pressures. Mild-moderate pulmonary hypertension. Mildly-moderately reduced Fick cardiac output/index. Successful PCI to proximal LAD using Onyx Frontier 3.0 x 12 mm drug-eluting stent (jailing D1) with 0% residual stenosis and TIMI-3 flow. Successful PCI to mid LAD using Onyx Frontier 2.5 x 15 mm drug-eluting stent (jailing D2) with angioplasty of  ostium of D2.  There is 0% residual stenosis in the LAD and 80% residual stenosis at the ostium of D2 with TIMI-3 flow. Challenging intervention via right radial approach due to tortuosity of the right subclavian artery and radial artery vasospasm.  Consider alternative access for future catheterizations.  Recommendations: Dual antiplatelet therapy with aspirin and clopidogrel for at least 12 months. Aggressive secondary prevention of coronary artery disease. Continued gentle diuresis. Escalate goal-directed medical therapy for acute on chronic HFrEF.  Nelva Bush, MD [facility]  Findings Coronary Findings Diagnostic  Dominance: Right  Left Main Vessel is large. Vessel is angiographically normal.  Left Anterior Descending Ost LAD lesion is 30% stenosed. Prox LAD lesion is 90% stenosed. The lesion is eccentric. Pressure wire/FFR was performed on the lesion. iFR = 0.63. Previously placed Mid LAD-1 stent (unknown type) is  widely patent. Mid LAD-2 lesion is 80% stenosed with 70% stenosed side branch in 2nd Diag. Pressure wire/FFR was performed on the lesion. iFR (LAD) = 0.63.  Ramus Intermedius Vessel is moderate in size. Ramus lesion is 40% stenosed.  Left Circumflex Vessel is large.  First Obtuse Marginal Branch Vessel is small in size.  Second Obtuse Marginal Branch Vessel is moderate in size.  Third Obtuse Marginal Branch Vessel is small in size.  Right Coronary Artery Vessel is moderate in size. Previously placed Mid RCA-1 stent (unknown type) is  widely patent. Previously placed stent displays no restenosis. Mid RCA-2 lesion is 50% stenosed.  Intervention  Prox LAD lesion Stent (Also treats lesions: Mid LAD-1) A drug-eluting stent was successfully placed using a STENT ONYX FRONTIER 3.0X12. Maximum pressure: 16 atm. Post-Intervention Lesion Assessment The intervention was successful. Pre-interventional TIMI flow is 3. Post-intervention TIMI flow is 3. No  complications occurred at this lesion. There is a 0% residual stenosis post intervention.  Mid LAD-1 lesion Stent (Also treats lesions: Mid LAD-2 with side branch in 2nd Diag) A drug-eluting stent was successfully placed using a STENT ONYX FRONTIER 2.5X15. Maximum pressure: 16 atm. Stent (Also treats lesions: Prox LAD) See details in Prox LAD lesion. Post-Intervention Lesion Assessment The intervention was successful. Pre-interventional TIMI flow is 3. Post-intervention TIMI flow is 3. No complications occurred at this lesion. There is a 0% residual stenosis post intervention.  Mid LAD-2 lesion with side branch in 2nd Diag Stent - Main Branch (Also treats lesions: Mid LAD-1) See details in Mid LAD-1 lesion. Angioplasty - Side Branch Balloon angioplasty was performed using a BALLN EUPHORA RX 2.0X12. Maximum pressure: 6 atm. Post-Intervention Lesion Assessment The intervention was successful. Pre-interventional TIMI flow is 3. Post-intervention TIMI flow is 3. No complications occurred at this lesion. There is a 0% residual stenosis in the main branch post intervention. There is a 80%  residual stenosis in the side branch post intervention.   CARDIAC CATHETERIZATION  CARDIAC CATHETERIZATION 10/27/2014  Narrative  Ost LAD lesion, 30% stenosed.  Ost RCA lesion, 30% stenosed.  Ost Ramus lesion, 50% stenosed.  Mid RCA lesion, 90% stenosed. There is a 0% residual stenosis post intervention.  A drug-eluting stent was placed.  Prox LAD lesion, 90% stenosed. There is a 0% residual stenosis post intervention.  A drug-eluting stent was placed.  Successful angioplasty and drug-eluting stent placement to both mid RCA in mid LAD.  Recommendations: Continue dual antiplatelets therapy for at least 12 months. Recommend aggressive treatment of risk factors. I started small dose carvedilol and lisinopril for cardiomyopathy.  Findings Coronary Findings Diagnostic  Dominance:  Co-dominant  Left Anterior Descending  Ramus Intermedius  Left Circumflex The vessel is large .  Right Coronary Artery The vessel is moderate in size .  Intervention  Prox LAD lesion PCI The pre-interventional distal flow is normal (TIMI 3). No pre-stent angioplasty was performed. A drug-eluting stent was placed. The strut is apposed. No post-stent angioplasty was performed. Maximum pressure: 12 atm. The post-interventional distal flow is normal (TIMI 3). The intervention was successful. No complications occurred at this lesion. Supplies used: STENT XIENCE ALPINE RX [id number] There is a 0% residual stenosis post intervention.  Mid RCA lesion PCI The pre-interventional distal flow is normal (TIMI 3). Pre-stent angioplasty was performed. A drug-eluting stent was placed. The strut is apposed. Post-stent angioplasty was performed. Maximum pressure: 14 atm. The post-interventional distal flow is normal (TIMI 3). The intervention was successful. No complications occurred at this lesion. Supplies used: [employer] RX 2.5X15 There is a 0% residual stenosis post intervention.     ECHOCARDIOGRAM  ECHOCARDIOGRAM COMPLETE 01/01/2022  Narrative ECHOCARDIOGRAM REPORT    Patient Name:   [name] Newman Date of Exam: 01/01/2022 Medical Rec #:  [id number]     Height:       60.0 in Accession #:    [id number]    Weight:       127.1 lb Date of Birth:  1940-06-17     BSA:          1.539 m Patient Age:    76 years      BP:           123/70 mmHg Patient Gender: F             HR:           96 bpm. Exam Location:  Inpatient  Procedure: 2D Echo, Cardiac Doppler, Color Doppler and Intracardiac Opacification Agent  Indications:    CHF  History:        Patient has prior history of Echocardiogram examinations, most recent 06/06/2021. Cardiomyopathy, CAD; Arrythmias:LBBB.  Sonographer:    Darlina Sicilian RDCS Referring Phys: [id number] OLADAPO ADEFESO  IMPRESSIONS   1. Left ventricular  ejection fraction, by estimation, is <20%. The left ventricle has severely decreased function. The left ventricle demonstrates global hypokinesis. The left ventricular internal cavity size was severely dilated. There is mild asymmetric left ventricular hypertrophy. Left ventricular diastolic parameters are indeterminate. There is no LV thrombus. 2. Right ventricular systolic function is mildly reduced. The right ventricular size is normal. There is moderately elevated pulmonary artery systolic pressure. The estimated right ventricular systolic pressure is 37.0 mmHg. 3. Left atrial size was severely dilated. 4. A small pericardial effusion is present. The pericardial effusion is anterior to the right ventricle and posterior to the left ventricle. 5. 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 Severe mitral valve regurgitation. No evidence of mitral stenosis. The mean mitral valve gradient is 5.0 mmHg. Central ventricular MR with A2-P2 worst pathology and pulmonary vein flow reversal. 6. Tricuspid valve regurgitation is mild to moderate. 7. The aortic valve is tricuspid. There is mild thickening of the aortic valve. Aortic valve regurgitation is mild to moderate. No aortic stenosis is present.  Comparison(s): Further decrease in LV size and function; increase in MR.  FINDINGS Left Ventricle: Left ventricular ejection fraction, by estimation, is <20%. The left ventricle has severely decreased function. The left ventricle demonstrates global hypokinesis. Definity contrast agent was given IV to delineate the left ventricular endocardial borders. The left ventricular internal cavity size was severely dilated. There is mild asymmetric left ventricular hypertrophy. Left ventricular diastolic parameters are indeterminate.  Right Ventricle: The right ventricular size is normal. No increase in right ventricular wall thickness. Right ventricular systolic function is mildly reduced. There is  moderately elevated pulmonary artery systolic pressure. The tricuspid regurgitant velocity is 3.19 m/s, and with an assumed right atrial pressure of 15 mmHg, the estimated right ventricular systolic pressure is 13.0 mmHg.  Left Atrium: Left atrial size was severely dilated.  Right Atrium: Right atrial size was normal in size.  Pericardium: A small pericardial effusion is present. The pericardial effusion is anterior to the right ventricle and posterior to the left ventricle.  Mitral Valve: MVA 5.8 cm2 by PHT. The mitral valve is rheumatic. Severe mitral valve regurgitation. No evidence of mitral valve stenosis. The mean mitral valve gradient is 5.0 mmHg with average heart rate of 94 bpm.  Tricuspid Valve: The tricuspid valve is grossly normal. Tricuspid valve regurgitation is mild to moderate. No evidence of tricuspid stenosis.  Aortic Valve: The aortic valve is tricuspid. There is mild thickening of the aortic valve. Aortic valve regurgitation is mild to moderate. Aortic regurgitation PHT measures 248 msec. No aortic stenosis is present.  Pulmonic Valve: The pulmonic valve was normal in structure. Pulmonic valve regurgitation is mild. No evidence of pulmonic stenosis.  Aorta: The aortic root, ascending aorta and aortic arch are all structurally normal, with no evidence of dilitation or obstruction and the aortic root and ascending aorta are structurally normal, with no evidence of dilitation.  IAS/Shunts: No atrial level shunt detected by color flow Doppler.   LEFT VENTRICLE PLAX 2D LVIDd:         6.40 cm      Diastology LVIDs:         5.90 cm      LV e' medial:    7.88 cm/s LV PW:         0.90 cm      LV E/e' medial:  17.6 LV IVS:        1.10 cm      LV e' lateral:   12.45 cm/s LVOT diam:     1.60 cm      LV E/e' lateral: 11.2 LV SV:         21 LV SV Index:   13 LVOT Area:     2.01 cm  LV Volumes (MOD) LV vol d, MOD A2C: 375.0 ml LV vol d, MOD A4C: 277.0 ml LV vol s, MOD A2C:  290.0 ml LV vol s, MOD A4C: 277.0 ml LV SV MOD A2C:     85.0 ml LV SV MOD A4C:     277.0 ml LV SV MOD BP:      66.4 ml  RIGHT VENTRICLE RV S prime:     5.33 cm/s  LEFT ATRIUM              Index        RIGHT ATRIUM           Index LA diam:        5.00 cm  3.25 cm/m   RA Area:     11.00 cm LA Vol (A2C):   113.0 ml 73.40 ml/m  RA Volume:   21.50 ml  13.97 ml/m LA Vol (A4C):   115.0 ml 74.70 ml/m LA Biplane Vol: 117.0 ml 76.00 ml/m AORTIC VALVE             PULMONIC VALVE LVOT Vmax:   54.90 cm/s  PR End Diast Vel: 8.41 msec LVOT Vmean:  40.400 cm/s RVOT Peak grad:   1 mmHg LVOT VTI:    0.103 m AI PHT:      248 msec  AORTA Ao Root diam: 2.70 cm Ao Asc diam:  3.00 cm  MITRAL VALVE                  TRICUSPID VALVE MV Area (PHT): 5.64 cm       TR Peak grad:   40.7 mmHg MV Mean grad:  5.0 mmHg       TR Vmax:        319.00 cm/s MV Decel Time: 135 msec MR Peak grad:    104.0 mmHg   SHUNTS MR Mean grad:    70.0 mmHg    Systemic VTI:  0.10 m MR Vmax:         510.00 cm/s  Systemic Diam: 1.60 cm MR Vmean:        403.0 cm/s   Pulmonic VTI:  0.072 m MR PISA:         2.26 cm MR PISA Eff ROA: 18 mm MR PISA Radius:  0.60 cm MV E velocity: 139.00 cm/s  Rudean Haskell MD Electronically signed by Rudean Haskell MD Signature Date/Time: 01/01/2022/11:23:40 AM    Final             EKG: NSR at 86 bpm with LBBB ~180 ms (personally reviewed)  TELEMETRY: NSR 90s (personally reviewed)  Assessment/Plan:  Acute on chronic combined CHF EF <20% on Echo this admission  GDMT has been somewhat limited by hypotension requiring midodrine.  Pt previously seen in 2017 and refused ICD. Willing to consider CRT-P Explained risks, benefits, and alternatives to PPM implantation, including but not limited to bleeding, infection, pneumothorax, pericardial effusion, lead dislodgement, heart attack, stroke, or death.  Pt verbalized understanding and agrees to proceed.  2. Acute Hypoxic  Respiratory Failure  3. Bilateral Pleural Effusions Remains on nasal canula Small effusions.  Continue IV Lasix 104m twice daily.    4. CAD S/p PCI to RCA and LAD in 2016 and more recently successful PCI with DES x2 to LAD 06/2021 Would hold plavix briefly for PPM implant.  Continue beta-blocker and statin.   5. 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   6. Acute on CKD Stage III Creatinine 1.93 on admission  Baseline around 0.9 to 1.3. Creatinine, ser  1.67* (11/01 0110) Continue to monitor with diuresis.    For questions or updates, please contact CPine GrovePlease consult [email or web address] for contact info under Cardiology/STEMI.  SJacalyn Lefevre PA-C  01/02/2022 10:42 AM  EP Attending  Patient seen and examined. Agree with the findings as  noted above. The patient is referred for consideration for biv PM upgrade. She is a pleasant 81 yo woman with chronic systolic heart failure, LBBB, and MR. She presented with acute on chronic systolic heart failure. She also has vocal cord paralysis. The patient has not had syncope. She saw Dr. Renaldo Reel 6 years ago and declinded an ICD. She has been on maximal guideline directed medical therapy. She also has CAD and a stent about 6 months ago. On exam she is a frail appearing 81 yo woman, NAD. Lungs with scattered basilar rales and CV with a RRR and split S2. Ext are without edema. Neuro is non-focal except for difficulty with her speech. Tele with NSR and ECG with NSR with LBBB.  A/P Acute on chronic systolic heart failure - she is mostly euvolemic. I have discussed the indications/risks/benefits/goals/expectations of Biv PPM insertion and she wishes to proceed. MR - she is not a candidate for an open procedure. Will need to reassess for possible mitra clip as an outpatient.   Carleene Overlie Meeya Goldin,MD

## 2022-01-02 NOTE — Progress Notes (Signed)
NT notified RN that patient's husband pulled patient to sit on the edge of the bed. RN rounded on the patient and assessed the PPM site. Site is clean dry and intact. Patient has no complaints of pain or discomfort. RN educated patient and patient's husband about restrictions post PPM and to call for help in regards to mobility. Patient and patient's husband verbalize understanding. Plan of care continues.

## 2022-01-02 NOTE — Progress Notes (Signed)
Heart Failure Nurse Navigator Progress Note  PCP: Carollee Leitz, MD PCP-Cardiologist: Rockey Situ Admission Diagnosis: Acute on chronic congestive heart failure, Elevated d-dimer, Acute kidney injury Admitted from: Home  Presentation:   [name] presented with weakness, shortness of breath for several months, swelling in lower extremities, slight cough, she has had vocal cord paralysis for several months now. BP 142/111, HR 93, BNP >4,500, Troponin 58, IV lasix given, CXR showed mild to moderate pleural effusions as well as vascular congestion. EP consulted and placed a BiV CRT-P on 11/1.   Both patient and husband were educated on the sign and symptoms of heart failure, daily weights, Diet/ fluid restrictions, taking all medications as prescribed and attending all medical appointments. Both patient and husband verbalized their understanding of education, a hospital HF TOC follow up was scheduled for 01/16/2022 @ 11 am.   ECHO/ LVEF: 25-30% G2DD  Clinical Course:  Past Medical History:  Diagnosis Date   Anxiety    Arthritis    CAD (coronary artery disease)    a. cardiac cath 10/2014: ostial LAD 30%, prox LAD 90% s/p PCI/DES, ostial RCA 30%, mid RCA 90% s/p PCI/DES, ostial Ramus 50%   Chronic back pain    Chronic fatigue    Chronic systolic CHF (congestive heart failure) ([place])    a. 10/2014 Echo: EF 20%; b. 06/2017 Echo: EF 20-25%, diff HK, Gr2 DD, Mild AI. Mod to sev MR. Mod dil LA.   Hepatitis C    Ischemic cardiomyopathy    a. 10/2014 Echo: EF 20%; b. 06/2017 Echo: EF 20-25%.   LBBB (left bundle branch block)    Moderate to Severe Mitral regurgitation    a. 06/2017 Echo: Mod-Sev MR.   Osteoporosis    UTI (lower urinary tract infection)      Social History   Socioeconomic History   Marital status: Married    Spouse name: Renella Steig   Number of children: 0   Years of education: 14   Highest education level: Not on file  Occupational History   Occupation: Receptionist    Comment:  Retired in [place] Use   Smoking status: Former    Packs/day: 1.00    Years: 10.00    Total pack years: 10.00    Types: Cigarettes   Smokeless tobacco: Never  Vaping Use   Vaping Use: Never used  Substance and Sexual Activity   Alcohol use: No    Alcohol/week: 0.0 standard drinks of alcohol   Drug use: No   Sexual activity: Yes    Birth control/protection: Post-menopausal  Other Topics Concern   Not on file  Social History Narrative   Pt is 81 yo female, married to husband of 21 years.  Pt has no children.  Pt lives with husband and teacup poodle, [name]. 12 y.o as of 01/01/19.  Pt states she hurt her back last year and has not been able to participate in much physical activity since. Pt enjoys traveling with her husband in motor home, doing crossword puzzles with husband, and playing cards with friends.  Pt was raised in [facility] and has lived in [place] entire adult life.  Pt worked in [employer] in [place] until [place] when they closed.     Social Determinants of Health   Financial Resource Strain: Low Risk  (01/02/2022)   Overall Financial Resource Strain (CARDIA)    Difficulty of Paying Living Expenses: Not hard at all  Food Insecurity: No Food Insecurity (01/02/2022)  Hunger Vital Sign    Worried About Running Out of Food in the Last Year: Never true    Ran Out of Food in the Last Year: Never true  Transportation Needs: No Transportation Needs (01/02/2022)   PRAPARE - Hydrologist (Medical): No    Lack of Transportation (Non-Medical): No  Physical Activity: Insufficiently Active (03/01/2021)   Exercise Vital Sign    Days of Exercise per Week: 5 days    Minutes of Exercise per Session: 10 min  Stress: No Stress Concern Present (03/01/2021)   [place]    Feeling of Stress : Not at all  Social Connections: Unknown (03/01/2021)   Social Connection and Isolation  Panel [NHANES]    Frequency of Communication with Friends and Family: Not on file    Frequency of Social Gatherings with Friends and Family: Not on file    Attends Religious Services: Not on file    Active Member of Clubs or Organizations: Not on file    Attends Archivist Meetings: Not on file    Marital Status: Married   Education Assessment and Provision:  Detailed education and instructions provided on heart failure disease management including the following:  Signs and symptoms of Heart Failure When to call the physician Importance of daily weights Low sodium diet Fluid restriction Medication management Anticipated future follow-up appointments  Patient education given on each of the above topics.  Patient acknowledges understanding via teach back method and acceptance of all instructions.  Education Materials:  "Living Better With Heart Failure" Booklet, HF zone tool, & Daily Weight Tracker Tool.  Patient has scale at home: yes Patient has pill box at home: Yes    High Risk Criteria for Readmission and/or Poor Patient Outcomes: Heart failure hospital admissions (last 6 months): 1  No Show rate: 1 % Difficult social situation: No Demonstrates medication adherence: Yes Primary Language: English Literacy level: reading, writing, and comprehension  Barriers of Care:   Continue HF Education New Medication changes   Considerations/Referrals:   Referral made to Heart Failure Pharmacist Stewardship: Yes Referral made to Heart Failure CSW/NCM TOC: No Referral made to Heart & Vascular TOC clinic: Yes, 01/16/2022 @ 11 am   Items for Follow-up on DC/TOC: HF continued education.  New medication changes   Earnestine Leys, BSN, Clinical cytogeneticist Only

## 2022-01-02 NOTE — Progress Notes (Addendum)
Rounding Note    Patient Name: [name] Date of Encounter: 01/02/2022  [place] Cardiologist: Ida Rogue, MD   Subjective   No acute overnight events. Patient states she is feeling better today. Her breathing has improved and she feels close to her baseline. However, she is still currently on 3L of O2 via nasal cannula. Will try to wean this off today. No chest pain or palpitations. She is very eager to go home. EP to see today for consideration of BiV PPM vs ICD.  Inpatient Medications    Scheduled Meds:  aspirin  81 mg Oral Daily   citalopram  10 mg Oral Daily   clopidogrel  75 mg Oral Daily   enoxaparin (LOVENOX) injection  30 mg Subcutaneous Daily   ferrous sulfate  325 mg Oral Q breakfast   furosemide  40 mg Intravenous Q12H   ipratropium  2 spray Each Nare QID   metoprolol succinate  12.5 mg Oral Daily   midodrine  2.5 mg Oral TID WC   rosuvastatin  40 mg Oral Daily   senna-docusate  1 tablet Oral BID   Continuous Infusions:  PRN Meds: acetaminophen **OR** acetaminophen, albuterol, ALPRAZolam, oxyCODONE   Vital Signs    Vitals:   01/02/22 0510 01/02/22 0820 01/02/22 0842 01/02/22 0844  BP: 119/73 116/64  116/87  Pulse: 90 100  98  Resp: 20 (!) 29  (!) 25  Temp: 97.9 F (36.6 C) 97.7 F (36.5 C) 97.7 F (36.5 C)   TempSrc: Oral Oral Oral   SpO2: 98% 99%  100%  Weight:      Height:        Intake/Output Summary (Last 24 hours) at 01/02/2022 0913 Last data filed at 01/02/2022 0844 Gross per 24 hour  Intake 960 ml  Output 1900 ml  Net -940 ml      01/02/2022    1:00 AM 01/01/2022    4:58 AM 12/31/2021   10:53 AM  Last 3 Weights  Weight (lbs) 124 lb 8 oz 127 lb 1.6 oz 125 lb  Weight (kg) 56.473 kg 57.652 kg 56.7 kg      Telemetry    Sinus rhythm with rates in the 80s to low 100s. PVCs and one 6 beat run of NSVT noted. - Personally Reviewed  ECG    No new ECG tracing today. - Personally Reviewed  Physical Exam    Physical Exam per MD:  GEN:  No acute distress.   Neck: Possibly slightly elevated JVD. Cardiac: RRR. Soft II/VI systolic murmur noted at the apex. Respiratory: No increased work of breathing. Clear to ausculation bilaterally. GI: Soft, non-distended, and non-tender. MS: No lower extremity edema. No deformity. Skin: Warm and dry. Neuro:  No focal deficits. Psych: Normal affect. Responds appropriately.   Labs    High Sensitivity Troponin:   Recent Labs  Lab 12/31/21 1119 12/31/21 1319  TROPONINIHS 58* 42*     Chemistry Recent Labs  Lab 12/31/21 1119 01/01/22 0049 01/02/22 0110  NA 142 144 142  K 4.1 3.5 3.3*  CL 100 100 104  CO2 25 27 26   GLUCOSE 108* 111* 99  BUN 35* 31* 24*  CREATININE 1.93* 1.85* 1.67*  CALCIUM 9.6 9.7 9.2  PROT 6.7 6.5  --   ALBUMIN 3.9 3.8  --   AST 37 29  --   ALT 26 29  --   ALKPHOS 62 62  --   BILITOT 0.7 0.9  --   [facility]  26* 27* 31*  ANIONGAP 17* 17* 12    Lipids  Recent Labs  Lab 01/02/22 0110  CHOL 101  TRIG 93  HDL 40*  LDLCALC 42  CHOLHDL 2.5    Hematology Recent Labs  Lab 12/31/21 1119 01/01/22 0049  WBC 8.0 8.1  RBC 4.53 4.41  HGB 14.9 14.3  HCT 46.3* 44.2  MCV 102.2* 100.2*  MCH 32.9 32.4  MCHC 32.2 32.4  RDW 16.8* 16.7*  PLT 182 174   Thyroid No results for input(s): "TSH", "FREET4" in the last 168 hours.  BNP Recent Labs  Lab 12/31/21 1120  BNP >4,500.0*    DDimer  Recent Labs  Lab 12/31/21 1342  DDIMER 1.84*     Radiology    DG Chest 2 View  Result Date: 01/01/2022 CLINICAL DATA:  Pleural effusion EXAM: CHEST - 2 VIEW COMPARISON:  Chest x-ray 12/04/2021 FINDINGS: The heart is enlarged, unchanged. There are small bilateral pleural effusions and bibasilar infiltrates similar to prior. There is no evidence for pneumothorax or acute fracture. IMPRESSION: Stable cardiomegaly with small bilateral pleural effusions and bibasilar infiltrates. Electronically Signed   By: Ronney Asters M.D.   On:  01/01/2022 17:56   ECHOCARDIOGRAM COMPLETE  Result Date: 01/01/2022    ECHOCARDIOGRAM REPORT   Patient Name:   [name] [place] Date of Exam: 01/01/2022 Medical Rec #:  [id number]     Height:       60.0 in Accession #:    [id number]    Weight:       127.1 lb Date of Birth:  [birth year]     BSA:          1.539 m Patient Age:    81 years      BP:           123/70 mmHg Patient Gender: F             HR:           96 bpm. Exam Location:  Inpatient Procedure: 2D Echo, Cardiac Doppler, Color Doppler and Intracardiac            Opacification Agent Indications:    CHF  History:        Patient has prior history of Echocardiogram examinations, most                 recent 06/06/2021. Cardiomyopathy, CAD; Arrythmias:LBBB.  Sonographer:    Darlina Sicilian RDCS Referring Phys: [id number] OLADAPO ADEFESO IMPRESSIONS  1. Left ventricular ejection fraction, by estimation, is <20%. The left ventricle has severely decreased function. The left ventricle demonstrates global hypokinesis. The left ventricular internal cavity size was severely dilated. There is mild asymmetric left ventricular hypertrophy. Left ventricular diastolic parameters are indeterminate. There is no LV thrombus.  2. Right ventricular systolic function is mildly reduced. The right ventricular size is normal. There is moderately elevated pulmonary artery systolic pressure. The estimated right ventricular systolic pressure is 97.9 mmHg.  3. Left atrial size was severely dilated.  4. A small pericardial effusion is present. The pericardial effusion is anterior to the right ventricle and posterior to the left ventricle.  5. 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 Severe mitral valve regurgitation. No evidence of mitral stenosis. The mean mitral valve gradient is 5.0 mmHg. Central ventricular MR with A2-P2 worst pathology and pulmonary vein flow reversal.  6. Tricuspid valve regurgitation is mild to moderate.  7. The aortic valve is tricuspid. There is mild  thickening of the  aortic valve. Aortic valve regurgitation is mild to moderate. No aortic stenosis is present. Comparison(s): Further decrease in LV size and function; increase in MR. FINDINGS  Left Ventricle: Left ventricular ejection fraction, by estimation, is <20%. The left ventricle has severely decreased function. The left ventricle demonstrates global hypokinesis. Definity contrast agent was given IV to delineate the left ventricular endocardial borders. The left ventricular internal cavity size was severely dilated. There is mild asymmetric left ventricular hypertrophy. Left ventricular diastolic parameters are indeterminate. Right Ventricle: The right ventricular size is normal. No increase in right ventricular wall thickness. Right ventricular systolic function is mildly reduced. There is moderately elevated pulmonary artery systolic pressure. The tricuspid regurgitant velocity is 3.19 m/s, and with an assumed right atrial pressure of 15 mmHg, the estimated right ventricular systolic pressure is 29.9 mmHg. Left Atrium: Left atrial size was severely dilated. Right Atrium: Right atrial size was normal in size. Pericardium: A small pericardial effusion is present. The pericardial effusion is anterior to the right ventricle and posterior to the left ventricle. Mitral Valve: MVA 5.8 cm2 by PHT. The mitral valve is rheumatic. Severe mitral valve regurgitation. No evidence of mitral valve stenosis. The mean mitral valve gradient is 5.0 mmHg with average heart rate of 94 bpm. Tricuspid Valve: The tricuspid valve is grossly normal. Tricuspid valve regurgitation is mild to moderate. No evidence of tricuspid stenosis. Aortic Valve: The aortic valve is tricuspid. There is mild thickening of the aortic valve. Aortic valve regurgitation is mild to moderate. Aortic regurgitation PHT measures 248 msec. No aortic stenosis is present. Pulmonic Valve: The pulmonic valve was normal in structure. Pulmonic valve regurgitation  is mild. No evidence of pulmonic stenosis. Aorta: The aortic root, ascending aorta and aortic arch are all structurally normal, with no evidence of dilitation or obstruction and the aortic root and ascending aorta are structurally normal, with no evidence of dilitation. IAS/Shunts: No atrial level shunt detected by color flow Doppler.  LEFT VENTRICLE PLAX 2D LVIDd:         6.40 cm      Diastology LVIDs:         5.90 cm      LV e' medial:    7.88 cm/s LV PW:         0.90 cm      LV E/e' medial:  17.6 LV IVS:        1.10 cm      LV e' lateral:   12.45 cm/s LVOT diam:     1.60 cm      LV E/e' lateral: 11.2 LV SV:         21 LV SV Index:   13 LVOT Area:     2.01 cm  LV Volumes (MOD) LV vol d, MOD A2C: 375.0 ml LV vol d, MOD A4C: 277.0 ml LV vol s, MOD A2C: 290.0 ml LV vol s, MOD A4C: 277.0 ml LV SV MOD A2C:     85.0 ml LV SV MOD A4C:     277.0 ml LV SV MOD BP:      66.4 ml RIGHT VENTRICLE RV S prime:     5.33 cm/s LEFT ATRIUM              Index        RIGHT ATRIUM           Index LA diam:        5.00 cm  3.25 cm/m   RA Area:  11.00 cm LA Vol (A2C):   113.0 ml 73.40 ml/m  RA Volume:   21.50 ml  13.97 ml/m LA Vol (A4C):   115.0 ml 74.70 ml/m LA Biplane Vol: 117.0 ml 76.00 ml/m  AORTIC VALVE             PULMONIC VALVE LVOT Vmax:   54.90 cm/s  PR End Diast Vel: 8.41 msec LVOT Vmean:  40.400 cm/s RVOT Peak grad:   1 mmHg LVOT VTI:    0.103 m AI PHT:      248 msec  AORTA Ao Root diam: 2.70 cm Ao Asc diam:  3.00 cm MITRAL VALVE                  TRICUSPID VALVE MV Area (PHT): 5.64 cm       TR Peak grad:   40.7 mmHg MV Mean grad:  5.0 mmHg       TR Vmax:        319.00 cm/s MV Decel Time: 135 msec MR Peak grad:    104.0 mmHg   SHUNTS MR Mean grad:    70.0 mmHg    Systemic VTI:  0.10 m MR Vmax:         510.00 cm/s  Systemic Diam: 1.60 cm MR Vmean:        403.0 cm/s   Pulmonic VTI:  0.072 m MR PISA:         2.26 cm MR PISA Eff ROA: 18 mm MR PISA Radius:  0.60 cm MV E velocity: 139.00 cm/s Rudean Haskell MD  Electronically signed by Rudean Haskell MD Signature Date/Time: 01/01/2022/11:23:40 AM    Final    DG Chest 2 View  Result Date: 12/31/2021 CLINICAL DATA:  Shortness of breath. Anemia. Leg swelling for several days. History of CHF. EXAM: CHEST - 2 VIEW COMPARISON:  Chest two views 11/13/2021, AP chest 06/08/2021, AP chest 06/05/2021. FINDINGS: Cardiac silhouette is again markedly enlarged. Mediastinal contours are within normal limits with moderate calcification within the aortic arch. Mild-to-moderate bilateral pleural effusions are increased from the most recent 11/13/2021 comparison chest radiographs. Mild bilateral interstitial thickening is slightly increased from 06/05/2021 and may represent mild interstitial pulmonary edema. No pneumothorax is seen. Old healed posterior right sixth rib fracture. Moderate multilevel degenerative disc changes of the visualized thoracic and lumbar spine. Moderate height loss of multiple midthoracic vertebral bodies, grossly similar to 06/05/2021 CT. Other plasty cement is again seen within the T12 vertebral body. IMPRESSION: 1. Mild-to-moderate bilateral pleural effusions, increased from 11/13/2021. 2. Mild bilateral interstitial thickening is slightly increased from 06/05/2021 and may represent mild interstitial pulmonary edema. Electronically Signed   By: Yvonne Kendall M.D.   On: 12/31/2021 11:58    Cardiac Studies   Echocardiogram 06/06/2021: Impressions: 1. Left ventricular ejection fraction, by estimation, is 25 to 30%. The  left ventricle has severely decreased function. The left ventricle  demonstrates global hypokinesis. The left ventricular internal cavity size  was mildly dilated. There is moderate  asymmetric left ventricular hypertrophy of the septal segment. Left  ventricular diastolic parameters are consistent with Grade II diastolic  dysfunction (pseudonormalization).   2. Right ventricular systolic function is low normal. The right   ventricular size is mildly enlarged.   3. Left atrial size was moderately dilated.   4. The mitral valve is normal in structure. Moderate mitral valve  regurgitation.   5. The aortic valve is tricuspid. Aortic valve regurgitation is trivial.  _______________   Right/ Left Cardiac Catheterization  06/13/2021: Conclusions: Severe single-vessel coronary artery disease with sequential 80-90% proximal/mid LAD stenoses flanking previously placed, which are highly significant by iFR and involve D1 and D2. Mild-moderate, nonobstructive CAD involving ramus intermedius and RCA. Widely patent mid LAD and mid RCA stents. Mildly elevated left and right heart filling pressures. Mild-moderate pulmonary hypertension. Mildly-moderately reduced Fick cardiac output/index. Successful PCI to proximal LAD using Onyx Frontier 3.0 x 12 mm drug-eluting stent (jailing D1) with 0% residual stenosis and TIMI-3 flow. Successful PCI to mid LAD using Onyx Frontier 2.5 x 15 mm drug-eluting stent (jailing D2) with angioplasty of ostium of D2.  There is 0% residual stenosis in the LAD and 80% residual stenosis at the ostium of D2 with TIMI-3 flow. Challenging intervention via right radial approach due to tortuosity of the right subclavian artery and radial artery vasospasm.  Consider alternative access for future catheterizations.   Recommendations: Dual antiplatelet therapy with aspirin and clopidogrel for at least 12 months. Aggressive secondary prevention of coronary artery disease. Continued gentle diuresis. Escalate goal-directed medical therapy for acute on chronic HFrEF. Diagnostic Dominance: Right  Intervention    _______________  Echocardiogram 01/01/2022: Impressions: 1. Left ventricular ejection fraction, by estimation, is <20%. The left  ventricle has severely decreased function. The left ventricle demonstrates  global hypokinesis. The left ventricular internal cavity size was severely  dilated. There  is mild  asymmetric left ventricular hypertrophy. Left ventricular diastolic  parameters are indeterminate. There is no LV thrombus.   2. Right ventricular systolic function is mildly reduced. The right  ventricular size is normal. There is moderately elevated pulmonary artery  systolic pressure. The estimated right ventricular systolic pressure is  26.8 mmHg.   3. Left atrial size was severely dilated.   4. A small pericardial effusion is present. The pericardial effusion is  anterior to the right ventricle and posterior to the left ventricle.   5. 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 Severe  mitral valve regurgitation. No evidence of mitral stenosis. The mean  mitral valve gradient is 5.0 mmHg. Central ventricular MR with A2-P2 worst  pathology and pulmonary vein flow  reversal.   6. Tricuspid valve regurgitation is mild to moderate.   7. The aortic valve is tricuspid. There is mild thickening of the aortic  valve. Aortic valve regurgitation is mild to moderate. No aortic stenosis  is present.   Comparison(s): Further decrease in LV size and function; increase in MR.   Patient Profile     81 y.o. female with a history of CAD s/p prior stenting to mid RCA and proximal LAD in 2016 and more recently successful PC with DES to proximal LAD and DES to mid LAD in 06/2021 (overlapping prior stent on both sides), ischemic cardiomyopathy/ chronic combined CHF with EF of 25-30% on Echo in 06/2021, moderate to severe MR, LBBB, hypotension on Midodrine, hyperlipidemia, COPD,  Hepatitis C, chronic anemia, chronic back pain, and vocal cord paralysis, who was admitted on 12/31/2021 for acute on chronic CHF with acute hypoxic respiratory failure after presenting with worsening shortness of breath.   Assessment & Plan    Acute Hypoxic Respiratory Failure  Acute on Chronic Combined CHF Bilateral Pleural Effusions BNP >4,500. Chest x-ray showed mild to moderate pleural effusion  increased from 11/2021 and mild bilateral interstitial thickening slightly increased from 06/2021. Repeat Echo this admission showed LVEF of <20% with global hypokinesis and mild asymmetric LVH but no LV thrombus, normal RV with mildly reduced systolic function, severe left atrial  enlargement, severe MR, mild to moderate TR, and mild to moderate AI. EF was 25-30% on last Echo in 06/2021. Net negative 1.7 L so far. Weight is 124 lbs today which is down 3 lbs from yesterday (weighed 122 lbs at office visit in 11/2021). Renal function improving with diuresis. - Volume status has improved.  - Will stop IV Lasix and transition back to Torsemide 20mg  daily.  - GDMT limited due to borderline BP requiring Midodrine at home. Continue Toprol-XL 12.5mg  daily. No ACEi/ARB/ARNI or MRA due to BP and renal function. - May need to increase home Midodrine to so we can increase diuresis. - Patient may benefit from thoracentesis for pleural effusion. Will discuss with MD. - Consider adding an SGLT2 inhibitor prior to discharge. - Continue to monitor daily weights, strict I/Os, and renal function. - Patient previously declined ICD. Re-discussed this with her today. She was under the impression this was a much large/extensive surgery. Dr. Harrington Challenger discussed with Dr. Lovena Le who felt patient may benefit from a BiV PPM at a minimum to help improve EF. Patient is willing to consider this. Will make patient NPO and EP will see today.   CAD S/p PCI to RCA and LAD in 2016 and more recently successful PCI with DES x2 to LAD. - No angina. - On DAPT with Aspirin and Plavix. Will hold Plavix in anticipation for possible PPM/ICD. - Continue beta-blocker and statin.   Moderate MR Echo in 06/2021 showed moderate MR but repeat Echo this admission showed progression to severe MR. Mitral valve appeared rheumatic or "post-inflammatory" tissues.  - Unsure if patient would be a candidate for mitral valve repair. I am also not sure patient would  want any other more invasive surgeries. Will review with MD.   Hypotension Previously had problems with hypertension but looks like more recently has had issues with hypotension and is on low dose Midodrine at home.  - BP stable today. - Continue Toprol-XL 12.5mg  daily. - Continue home Midodrine 2.5mg  three times daily.   Hyperlipidemia Lipid panel this admission: Total Cholesterol 101, Triglycerides 93, HDL 40, LDL 42. LDL goal <70. - Continue Crestor 40mg  daily.   Acute on CKD Stage III Creatinine 1.93 and has slowly been trending down with diuresis. Creatinine 1.67 today.  Baseline around 0.9 to 1.3. - Continue to monitor with diuresis.  Hypokalemia Potassium 3.3 today.  - Will replete.    Otherwise, per primary team: - Iron deficiency anemia - COPD - Chronic back pain  For questions or updates, please contact [place] Please consult [email or web address] for contact info under        Signed, Darreld Mclean, PA-C  01/02/2022, 9:13 AM    Patient seen and examined  I agree with findngs as noted by C Sarajane Jews Pt is doing better  has diuresed. ON exam: Lungs CTA Neck   JVP minimally elevated Cardiac RRR   Gr II/VI sytolic murmur at apex Ext with no significant edema  Review of echo   Severe LV dysfuncction (EF <20%, severe MR)   Impresson  Acute on chronic HFrEF   Pt clnically improved   I have reviewed case with EP   Reviewed CT    Pt with LBBB   Wide QRS   May benefit from BiV pacing     She has declined ICD in past   Thought it was a big surgery        We discussed      EP to see patient  MR  MR is severe on echo   Valve leaflets thickened, tethered with dilated LV May improve if forward output improved  As noted above, switch to po torsemide   Will make daily Continue other meds  Dorris Carnes MD

## 2022-01-02 NOTE — Assessment & Plan Note (Addendum)
Echocardiogram with decreased LV systolic function <53%, global hypokinesis, severe dilatated internal cavity, asymmetric left ventricular hypertrophy, RV systolic function with mild reduction, RVSP 55.7 severe dilatation LA, small pericardial effusion, severe mitral regurgitation (rheumatic), mild to moderate TR, mild to moderate AI.   Patient was placed on IV furosemide for diuresis, negative fluid balance was achieved, -3727 ml, with improvement of her symptoms.   Patient had a biventricular pacer implanted to improve systolic synchrony.  She has progressive severe mitral regurgitation, hopefully biventricular pacing will help in heart function. She is not candidate for invasive procedures.   Limited pharmacologic therapy due to hypotension.  Patient will continue low dose metoprolol and diuresis with torsemide.  Plan to increase to bid torsemide in case of weigh gain 2 to 3 lbs in 24 hrs 5 lbs in 7 days.

## 2022-01-02 NOTE — H&P (View-Only) (Signed)
ELECTROPHYSIOLOGY CONSULT NOTE    Patient ID: CASTELLA LERNER MRN: [id number], DOB/AGE: 80-Aug-1942 80 y.o.  Admit date: 12/31/2021 Date of Consult: 01/02/2022  Primary Physician: Carollee Leitz, MD Primary Cardiologist: Ida Rogue, MD  Electrophysiologist: New   Referring Provider: Dr. Harrington Challenger  Patient Profile: JIANNI SHELDEN is a 81 y.o. female with a history of ICM with chronic combined heart failure LVEF 25%, CAD with PCI to mid RCA and proximal LAD in 2016 (intolerant to brilinta), moderate to severe MR, CKD 3, LBBB, vocal cord paralysis, and hypotension who is being seen today for the evaluation of CHF and LBBB at the request of Dr. Harrington Challenger.  HPI:  NAZIYAH TIESZEN is a 81 y.o. female with medical history as above.   She presented to [facility] 12/31/21 with lower extremity swelling, worsening weakness, and vocal cord paralysis. She reported SOB and found to be volume up prompting cardiology consult.    Hb 14.9 sCr 1.93, baseline ?1.35 HS troponin 58 --> 42 (was in the 1900 range in April 2023) BNP > 4500 CXR with bilateral pleural effusions, pulmonary edema D-dimer 1.84  Potassium3.3* (11/01 0110) Creatinine, ser  1.67* (11/01 0110) PLT  174 (10/31 0049) HGB  14.3 (10/31 0049) WBC 8.1 (10/31 0049) Troponin I (High Sensitivity)42* (10/30 1319).    Has been diuresed with IV lasix, on-going. Has previously refused ICD, and GDMT has been limited by hypotension requiring midodrine.  Negative 1.7 L, though weight only down 0.5 lb.   This am she is feeling OK. She states she is able to lie back somewhat flat. She does not want an ICD, but she is willing to consider pacemaker (CRT) if it is felt it may help her symptoms. She understands this will not be likely to help her valve, and may take months to provide any noticeable benefity.   Past Medical History:  Diagnosis Date   Anxiety    Arthritis    CAD (coronary artery disease)    a. cardiac cath 10/2014: ostial LAD 30%, prox LAD 90% s/p  PCI/DES, ostial RCA 30%, mid RCA 90% s/p PCI/DES, ostial Ramus 50%   Chronic back pain    Chronic fatigue    Chronic systolic CHF (congestive heart failure) ([place])    a. 10/2014 Echo: EF 20%; b. 06/2017 Echo: EF 20-25%, diff HK, Gr2 DD, Mild AI. Mod to sev MR. Mod dil LA.   Hepatitis C    Ischemic cardiomyopathy    a. 10/2014 Echo: EF 20%; b. 06/2017 Echo: EF 20-25%.   LBBB (left bundle branch block)    Moderate to Severe Mitral regurgitation    a. 06/2017 Echo: Mod-Sev MR.   Osteoporosis    UTI (lower urinary tract infection)      Surgical History:  Past Surgical History:  Procedure Laterality Date   BACK SURGERY  07/14   T10, L1   CARDIAC CATHETERIZATION Bilateral 10/27/2014   Procedure: Left Heart Cath and Coronary Angiography;  Surgeon: Minna Merritts, MD;  Location: [place] [facility];  Service: Cardiovascular;  Laterality: Bilateral;   CARDIAC CATHETERIZATION N/A 10/27/2014   Procedure: Coronary Stent Intervention;  Surgeon: Wellington Hampshire, MD;  Location: [place] [facility];  Service: Cardiovascular;  Laterality: N/A;   CORONARY STENT INTERVENTION N/A 06/13/2021   Procedure: CORONARY STENT INTERVENTION;  Surgeon: Nelva Bush, MD;  Location: [place] [facility];  Service: Cardiovascular;  Laterality: N/A;   HIP ARTHROPLASTY Left 06/07/2021   Procedure: ARTHROPLASTY BIPOLAR HIP (HEMIARTHROPLASTY);  Surgeon:  Leim Fabry, MD;  Location: [facility];  Service: Orthopedics;  Laterality: Left;   INTRAVASCULAR PRESSURE WIRE/FFR STUDY N/A 06/13/2021   Procedure: INTRAVASCULAR PRESSURE WIRE/FFR STUDY;  Surgeon: Nelva Bush, MD;  Location: [place] [facility];  Service: Cardiovascular;  Laterality: N/A;   RIGHT/LEFT HEART CATH AND CORONARY ANGIOGRAPHY N/A 06/13/2021   Procedure: RIGHT/LEFT HEART CATH AND CORONARY ANGIOGRAPHY;  Surgeon: Nelva Bush, MD;  Location: [place] [facility];  Service: Cardiovascular;  Laterality: N/A;     Medications Prior to Admission  Medication  Sig Dispense Refill Last Dose   albuterol (VENTOLIN HFA) 108 (90 Base) MCG/ACT inhaler Inhale 2 puffs into the lungs every 6 (six) hours as needed for wheezing or shortness of breath.   Unknown   ALPRAZolam (XANAX) 0.25 MG tablet TAKE ONE TABLET BY MOUTH ONCE TO TWICE DAILY AS NEEDED (Patient taking differently: Take 0.25 mg by mouth 2 (two) times daily as needed for anxiety.) 60 tablet 5 12/30/2021   aspirin 81 MG chewable tablet Chew 1 tablet (81 mg total) by mouth daily.   12/31/2021   citalopram (CELEXA) 10 MG tablet Take 1 tablet (10 mg total) by mouth daily. 90 tablet 3 Unknown   denosumab (PROLIA) 60 MG/ML SOSY injection Inject 60 mg into the skin every 6 (six) months. 1 mL 2 Unknown   ipratropium (ATROVENT) 0.06 % nasal spray Place 2 sprays into both nostrils 4 (four) times daily. 15 mL 11 12/30/2021   metoprolol succinate (TOPROL-XL) 25 MG 24 hr tablet Take 0.5 tablets (12.5 mg total) by mouth daily. 45 tablet 1 12/31/2021 at 0700   midodrine (PROAMATINE) 2.5 MG tablet Take 1 tablet (2.5 mg total) by mouth 3 (three) times daily with meals. 270 tablet 3 12/31/2021   ondansetron (ZOFRAN-ODT) 4 MG disintegrating tablet TAKE 1 TABLET BY MOUTH TWICE A DAY AS NEEDED FOR NAUSEA AND VOMITING appt further refills no exceptions (Patient taking differently: Take 4 mg by mouth 2 (two) times daily as needed for nausea or vomiting.) 60 tablet 11 12/31/2021   oxyCODONE (OXY IR/ROXICODONE) 5 MG immediate release tablet Take 1 tablet (5 mg total) by mouth 2 (two) times daily as needed for moderate pain (pain score 4-6). 60 tablet 0 Past Week   rosuvastatin (CRESTOR) 40 MG tablet TAKE ONE TABLET BY MOUTH ONE TIME DAILY AT 6PM (Patient taking differently: Take 40 mg by mouth daily.) 90 tablet 0 12/31/2021   torsemide (DEMADEX) 20 MG tablet TAKE ONE TABLET BY MOUTH ONE TIME DAILY, EXCEPT DO NOT TAKE ON SUNDAYS (Patient taking differently: Take 20 mg by mouth once. EXCEPT DO NOT TAKE ON SUNDAYS) 90 tablet 0  12/31/2021   clopidogrel (PLAVIX) 75 MG tablet Take 1 tablet (75 mg total) by mouth daily. 90 tablet 1    FLUZONE HIGH-DOSE QUADRIVALENT 0.7 ML SUSY       PFIZER COVID-19 VAC BIVALENT injection  (Patient not taking: Reported on 08/17/2021)       Inpatient Medications:   aspirin  81 mg Oral Daily   citalopram  10 mg Oral Daily   enoxaparin (LOVENOX) injection  30 mg Subcutaneous Daily   ferrous sulfate  325 mg Oral Q breakfast   furosemide  40 mg Intravenous Q12H   ipratropium  2 spray Each Nare QID   metoprolol succinate  12.5 mg Oral Daily   midodrine  2.5 mg Oral TID WC   rosuvastatin  40 mg Oral Daily   senna-docusate  1 tablet Oral BID    Allergies:  Allergies  Allergen Reactions   Sugar-Protein-Starch Nausea And Vomiting    Only to sugar    Social History   Socioeconomic History   Marital status: Married    Spouse name: Nakira Litzau   Number of children: 0   Years of education: 14   Highest education level: Not on file  Occupational History   Occupation: Receptionist    Comment: Retired in [place] Use   Smoking status: Former    Packs/day: 1.00    Years: 10.00    Total pack years: 10.00    Types: Cigarettes   Smokeless tobacco: Never  Vaping Use   Vaping Use: Never used  Substance and Sexual Activity   Alcohol use: No    Alcohol/week: 0.0 standard drinks of alcohol   Drug use: No   Sexual activity: Yes    Birth control/protection: Post-menopausal  Other Topics Concern   Not on file  Social History Narrative   Pt is 81 yo female, married to husband of 21 years.  Pt has no children.  Pt lives with husband and teacup poodle, [name]. 82 y.o as of 01/01/19.  Pt states she hurt her back last year and has not been able to participate in much physical activity since. Pt enjoys traveling with her husband in motor home, doing crossword puzzles with husband, and playing cards with friends.  Pt was raised in [place] and has lived in [place] entire adult life.   Pt worked in [employer] in [place] until [place] [employer] when they closed.     Social Determinants of Health   Financial Resource Strain: Low Risk  (01/02/2022)   Overall Financial Resource Strain (CARDIA)    Difficulty of Paying Living Expenses: Not hard at all  Food Insecurity: No Food Insecurity (01/02/2022)   Hunger Vital Sign    Worried About Running Out of Food in the Last Year: Never true    Ran Out of Food in the Last Year: Never true  Transportation Needs: No Transportation Needs (01/02/2022)   PRAPARE - Hydrologist (Medical): No    Lack of Transportation (Non-Medical): No  Physical Activity: Insufficiently Active (03/01/2021)   Exercise Vital Sign    Days of Exercise per Week: 5 days    Minutes of Exercise per Session: 10 min  Stress: No Stress Concern Present (03/01/2021)   [place]    Feeling of Stress : Not at all  Social Connections: Unknown (03/01/2021)   Social Connection and Isolation Panel [NHANES]    Frequency of Communication with Friends and Family: Not on file    Frequency of Social Gatherings with Friends and Family: Not on file    Attends Religious Services: Not on file    Active Member of Clubs or Organizations: Not on file    Attends Archivist Meetings: Not on file    Marital Status: Married  Intimate Partner Violence: Not At Risk (03/01/2021)   Humiliation, Afraid, Rape, and Kick questionnaire    Fear of Current or Ex-Partner: No    Emotionally Abused: No    Physically Abused: No    Sexually Abused: No     Family History  Problem Relation Age of Onset   Arthritis Mother    Heart disease Father        passed at 37   Diabetes Father      Review of Systems: All other systems reviewed and are otherwise  negative except as noted above.  Physical Exam: Vitals:   01/02/22 0510 01/02/22 0820 01/02/22 0842 01/02/22 0844  BP: 119/73 116/64  116/87   Pulse: 90 100  98  Resp: 20 (!) 29  (!) 25  Temp: 97.9 F (36.6 C) 97.7 F (36.5 C) 97.7 F (36.5 C)   TempSrc: Oral Oral Oral   SpO2: 98% 99%  100%  Weight:      Height:        GEN- The patient is elderly appearing, alert and oriented x 3 today.  Hoarse HEENT: normocephalic, atraumatic; sclera clear, conjunctiva pink; hearing intact; oropharynx clear; neck supple Lungs- Diminished.  Heart- Regular rate and rhythm. Soft II/IV systolic apical murmur. GI- soft, non-tender, non-distended, bowel sounds present Extremities- no clubbing, cyanosis, or edema; DP/PT/radial pulses 2+ bilaterally MS- no significant deformity or atrophy Skin- warm and dry, no rash or lesion Psych- euthymic mood, full affect Neuro- strength and sensation are intact  Labs:   Lab Results  Component Value Date   WBC 8.1 01/01/2022   HGB 14.3 01/01/2022   HCT 44.2 01/01/2022   MCV 100.2 (H) 01/01/2022   PLT 174 01/01/2022    Recent Labs  Lab 01/01/22 0049 01/02/22 0110  NA 144 142  K 3.5 3.3*  CL 100 104  CO2 27 26  BUN 31* 24*  CREATININE 1.85* 1.67*  CALCIUM 9.7 9.2  PROT 6.5  --   BILITOT 0.9  --   ALKPHOS 62  --   ALT 29  --   AST 29  --   GLUCOSE 111* 99      Radiology/Studies: DG Chest 2 View  Result Date: 01/01/2022 CLINICAL DATA:  Pleural effusion EXAM: CHEST - 2 VIEW COMPARISON:  Chest x-ray 12/04/2021 FINDINGS: The heart is enlarged, unchanged. There are small bilateral pleural effusions and bibasilar infiltrates similar to prior. There is no evidence for pneumothorax or acute fracture. IMPRESSION: Stable cardiomegaly with small bilateral pleural effusions and bibasilar infiltrates. Electronically Signed   By: Ronney Asters M.D.   On: 01/01/2022 17:56   ECHOCARDIOGRAM COMPLETE  Result Date: 01/01/2022    ECHOCARDIOGRAM REPORT   Patient Name:   GLORIANNA GOTT Rudd Date of Exam: 01/01/2022 Medical Rec #:  [id number]     Height:       60.0 in Accession #:    [id number]    Weight:        127.1 lb Date of Birth:  04/06/1940     BSA:          1.539 m Patient Age:    27 years      BP:           123/70 mmHg Patient Gender: F             HR:           96 bpm. Exam Location:  Inpatient Procedure: 2D Echo, Cardiac Doppler, Color Doppler and Intracardiac            Opacification Agent Indications:    CHF  History:        Patient has prior history of Echocardiogram examinations, most                 recent 06/06/2021. Cardiomyopathy, CAD; Arrythmias:LBBB.  Sonographer:    Darlina Sicilian RDCS Referring Phys: [id number] OLADAPO ADEFESO IMPRESSIONS  1. Left ventricular ejection fraction, by estimation, is <20%. The left ventricle has severely decreased function. The left ventricle demonstrates global hypokinesis. The left ventricular  internal cavity size was severely dilated. There is mild asymmetric left ventricular hypertrophy. Left ventricular diastolic parameters are indeterminate. There is no LV thrombus.  2. Right ventricular systolic function is mildly reduced. The right ventricular size is normal. There is moderately elevated pulmonary artery systolic pressure. The estimated right ventricular systolic pressure is 85.0 mmHg.  3. Left atrial size was severely dilated.  4. A small pericardial effusion is present. The pericardial effusion is anterior to the right ventricle and posterior to the left ventricle.  5. 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 Severe mitral valve regurgitation. No evidence of mitral stenosis. The mean mitral valve gradient is 5.0 mmHg. Central ventricular MR with A2-P2 worst pathology and pulmonary vein flow reversal.  6. Tricuspid valve regurgitation is mild to moderate.  7. The aortic valve is tricuspid. There is mild thickening of the aortic valve. Aortic valve regurgitation is mild to moderate. No aortic stenosis is present. Comparison(s): Further decrease in LV size and function; increase in MR. FINDINGS  Left Ventricle: Left ventricular ejection fraction, by  estimation, is <20%. The left ventricle has severely decreased function. The left ventricle demonstrates global hypokinesis. Definity contrast agent was given IV to delineate the left ventricular endocardial borders. The left ventricular internal cavity size was severely dilated. There is mild asymmetric left ventricular hypertrophy. Left ventricular diastolic parameters are indeterminate. Right Ventricle: The right ventricular size is normal. No increase in right ventricular wall thickness. Right ventricular systolic function is mildly reduced. There is moderately elevated pulmonary artery systolic pressure. The tricuspid regurgitant velocity is 3.19 m/s, and with an assumed right atrial pressure of 15 mmHg, the estimated right ventricular systolic pressure is 27.7 mmHg. Left Atrium: Left atrial size was severely dilated. Right Atrium: Right atrial size was normal in size. Pericardium: A small pericardial effusion is present. The pericardial effusion is anterior to the right ventricle and posterior to the left ventricle. Mitral Valve: MVA 5.8 cm2 by PHT. The mitral valve is rheumatic. Severe mitral valve regurgitation. No evidence of mitral valve stenosis. The mean mitral valve gradient is 5.0 mmHg with average heart rate of 94 bpm. Tricuspid Valve: The tricuspid valve is grossly normal. Tricuspid valve regurgitation is mild to moderate. No evidence of tricuspid stenosis. Aortic Valve: The aortic valve is tricuspid. There is mild thickening of the aortic valve. Aortic valve regurgitation is mild to moderate. Aortic regurgitation PHT measures 248 msec. No aortic stenosis is present. Pulmonic Valve: The pulmonic valve was normal in structure. Pulmonic valve regurgitation is mild. No evidence of pulmonic stenosis. Aorta: The aortic root, ascending aorta and aortic arch are all structurally normal, with no evidence of dilitation or obstruction and the aortic root and ascending aorta are structurally normal, with no  evidence of dilitation. IAS/Shunts: No atrial level shunt detected by color flow Doppler.  LEFT VENTRICLE PLAX 2D LVIDd:         6.40 cm      Diastology LVIDs:         5.90 cm      LV e' medial:    7.88 cm/s LV PW:         0.90 cm      LV E/e' medial:  17.6 LV IVS:        1.10 cm      LV e' lateral:   12.45 cm/s LVOT diam:     1.60 cm      LV E/e' lateral: 11.2 LV SV:  21 LV SV Index:   13 LVOT Area:     2.01 cm  LV Volumes (MOD) LV vol d, MOD A2C: 375.0 ml LV vol d, MOD A4C: 277.0 ml LV vol s, MOD A2C: 290.0 ml LV vol s, MOD A4C: 277.0 ml LV SV MOD A2C:     85.0 ml LV SV MOD A4C:     277.0 ml LV SV MOD BP:      66.4 ml RIGHT VENTRICLE RV S prime:     5.33 cm/s LEFT ATRIUM              Index        RIGHT ATRIUM           Index LA diam:        5.00 cm  3.25 cm/m   RA Area:     11.00 cm LA Vol (A2C):   113.0 ml 73.40 ml/m  RA Volume:   21.50 ml  13.97 ml/m LA Vol (A4C):   115.0 ml 74.70 ml/m LA Biplane Vol: 117.0 ml 76.00 ml/m  AORTIC VALVE             PULMONIC VALVE LVOT Vmax:   54.90 cm/s  PR End Diast Vel: 8.41 msec LVOT Vmean:  40.400 cm/s RVOT Peak grad:   1 mmHg LVOT VTI:    0.103 m AI PHT:      248 msec  AORTA Ao Root diam: 2.70 cm Ao Asc diam:  3.00 cm MITRAL VALVE                  TRICUSPID VALVE MV Area (PHT): 5.64 cm       TR Peak grad:   40.7 mmHg MV Mean grad:  5.0 mmHg       TR Vmax:        319.00 cm/s MV Decel Time: 135 msec MR Peak grad:    104.0 mmHg   SHUNTS MR Mean grad:    70.0 mmHg    Systemic VTI:  0.10 m MR Vmax:         510.00 cm/s  Systemic Diam: 1.60 cm MR Vmean:        403.0 cm/s   Pulmonic VTI:  0.072 m MR PISA:         2.26 cm MR PISA Eff ROA: 18 mm MR PISA Radius:  0.60 cm MV E velocity: 139.00 cm/s Rudean Haskell MD Electronically signed by Rudean Haskell MD Signature Date/Time: 01/01/2022/11:23:40 AM    Final    DG Chest 2 View  Result Date: 12/31/2021 CLINICAL DATA:  Shortness of breath. Anemia. Leg swelling for several days. History of CHF. EXAM:  CHEST - 2 VIEW COMPARISON:  Chest two views 11/13/2021, AP chest 06/08/2021, AP chest 06/05/2021. FINDINGS: Cardiac silhouette is again markedly enlarged. Mediastinal contours are within normal limits with moderate calcification within the aortic arch. Mild-to-moderate bilateral pleural effusions are increased from the most recent 11/13/2021 comparison chest radiographs. Mild bilateral interstitial thickening is slightly increased from 06/05/2021 and may represent mild interstitial pulmonary edema. No pneumothorax is seen. Old healed posterior right sixth rib fracture. Moderate multilevel degenerative disc changes of the visualized thoracic and lumbar spine. Moderate height loss of multiple midthoracic vertebral bodies, grossly similar to 06/05/2021 CT. Other plasty cement is again seen within the T12 vertebral body. IMPRESSION: 1. Mild-to-moderate bilateral pleural effusions, increased from 11/13/2021. 2. Mild bilateral interstitial thickening is slightly increased from 06/05/2021 and may represent mild interstitial pulmonary edema. Electronically Signed  By: Yvonne Kendall M.D.   On: 12/31/2021 11:58      Cardiac Studies & Procedures   CARDIAC CATHETERIZATION  CARDIAC CATHETERIZATION 06/14/2021  Narrative Conclusions: Severe single-vessel coronary artery disease with sequential 80-90% proximal/mid LAD stenoses flanking previously placed, which are highly significant by iFR and involve D1 and D2. Mild-moderate, nonobstructive CAD involving ramus intermedius and RCA. Widely patent mid LAD and mid RCA stents. Mildly elevated left and right heart filling pressures. Mild-moderate pulmonary hypertension. Mildly-moderately reduced Fick cardiac output/index. Successful PCI to proximal LAD using Onyx Frontier 3.0 x 12 mm drug-eluting stent (jailing D1) with 0% residual stenosis and TIMI-3 flow. Successful PCI to mid LAD using Onyx Frontier 2.5 x 15 mm drug-eluting stent (jailing D2) with angioplasty of  ostium of D2.  There is 0% residual stenosis in the LAD and 80% residual stenosis at the ostium of D2 with TIMI-3 flow. Challenging intervention via right radial approach due to tortuosity of the right subclavian artery and radial artery vasospasm.  Consider alternative access for future catheterizations.  Recommendations: Dual antiplatelet therapy with aspirin and clopidogrel for at least 12 months. Aggressive secondary prevention of coronary artery disease. Continued gentle diuresis. Escalate goal-directed medical therapy for acute on chronic HFrEF.  Nelva Bush, MD [facility]  Findings Coronary Findings Diagnostic  Dominance: Right  Left Main Vessel is large. Vessel is angiographically normal.  Left Anterior Descending Ost LAD lesion is 30% stenosed. Prox LAD lesion is 90% stenosed. The lesion is eccentric. Pressure wire/FFR was performed on the lesion. iFR = 0.63. Previously placed Mid LAD-1 stent (unknown type) is  widely patent. Mid LAD-2 lesion is 80% stenosed with 70% stenosed side branch in 2nd Diag. Pressure wire/FFR was performed on the lesion. iFR (LAD) = 0.63.  Ramus Intermedius Vessel is moderate in size. Ramus lesion is 40% stenosed.  Left Circumflex Vessel is large.  First Obtuse Marginal Branch Vessel is small in size.  Second Obtuse Marginal Branch Vessel is moderate in size.  Third Obtuse Marginal Branch Vessel is small in size.  Right Coronary Artery Vessel is moderate in size. Previously placed Mid RCA-1 stent (unknown type) is  widely patent. Previously placed stent displays no restenosis. Mid RCA-2 lesion is 50% stenosed.  Intervention  Prox LAD lesion Stent (Also treats lesions: Mid LAD-1) A drug-eluting stent was successfully placed using a STENT ONYX FRONTIER 3.0X12. Maximum pressure: 16 atm. Post-Intervention Lesion Assessment The intervention was successful. Pre-interventional TIMI flow is 3. Post-intervention TIMI flow is 3. No  complications occurred at this lesion. There is a 0% residual stenosis post intervention.  Mid LAD-1 lesion Stent (Also treats lesions: Mid LAD-2 with side branch in 2nd Diag) A drug-eluting stent was successfully placed using a STENT ONYX FRONTIER 2.5X15. Maximum pressure: 16 atm. Stent (Also treats lesions: Prox LAD) See details in Prox LAD lesion. Post-Intervention Lesion Assessment The intervention was successful. Pre-interventional TIMI flow is 3. Post-intervention TIMI flow is 3. No complications occurred at this lesion. There is a 0% residual stenosis post intervention.  Mid LAD-2 lesion with side branch in 2nd Diag Stent - Main Branch (Also treats lesions: Mid LAD-1) See details in Mid LAD-1 lesion. Angioplasty - Side Branch Balloon angioplasty was performed using a BALLN EUPHORA RX 2.0X12. Maximum pressure: 6 atm. Post-Intervention Lesion Assessment The intervention was successful. Pre-interventional TIMI flow is 3. Post-intervention TIMI flow is 3. No complications occurred at this lesion. There is a 0% residual stenosis in the main branch post intervention. There is a 80%  residual stenosis in the side branch post intervention.   CARDIAC CATHETERIZATION  CARDIAC CATHETERIZATION 10/27/2014  Narrative  Ost LAD lesion, 30% stenosed.  Ost RCA lesion, 30% stenosed.  Ost Ramus lesion, 50% stenosed.  Mid RCA lesion, 90% stenosed. There is a 0% residual stenosis post intervention.  A drug-eluting stent was placed.  Prox LAD lesion, 90% stenosed. There is a 0% residual stenosis post intervention.  A drug-eluting stent was placed.  Successful angioplasty and drug-eluting stent placement to both mid RCA in mid LAD.  Recommendations: Continue dual antiplatelets therapy for at least 12 months. Recommend aggressive treatment of risk factors. I started small dose carvedilol and lisinopril for cardiomyopathy.  Findings Coronary Findings Diagnostic  Dominance:  Co-dominant  Left Anterior Descending  Ramus Intermedius  Left Circumflex The vessel is large .  Right Coronary Artery The vessel is moderate in size .  Intervention  Prox LAD lesion PCI The pre-interventional distal flow is normal (TIMI 3). No pre-stent angioplasty was performed. A drug-eluting stent was placed. The strut is apposed. No post-stent angioplasty was performed. Maximum pressure: 12 atm. The post-interventional distal flow is normal (TIMI 3). The intervention was successful. No complications occurred at this lesion. Supplies used: STENT XIENCE ALPINE RX [id number] There is a 0% residual stenosis post intervention.  Mid RCA lesion PCI The pre-interventional distal flow is normal (TIMI 3). Pre-stent angioplasty was performed. A drug-eluting stent was placed. The strut is apposed. Post-stent angioplasty was performed. Maximum pressure: 14 atm. The post-interventional distal flow is normal (TIMI 3). The intervention was successful. No complications occurred at this lesion. Supplies used: [facility] 2.5X15 There is a 0% residual stenosis post intervention.     ECHOCARDIOGRAM  ECHOCARDIOGRAM COMPLETE 01/01/2022  Narrative ECHOCARDIOGRAM REPORT    Patient Name:   MINAH AXELROD Hernandes Date of Exam: 01/01/2022 Medical Rec #:  [id number]     Height:       60.0 in Accession #:    [id number]    Weight:       127.1 lb Date of Birth:  01-10-1941     BSA:          1.539 m Patient Age:    43 years      BP:           123/70 mmHg Patient Gender: F             HR:           96 bpm. Exam Location:  Inpatient  Procedure: 2D Echo, Cardiac Doppler, Color Doppler and Intracardiac Opacification Agent  Indications:    CHF  History:        Patient has prior history of Echocardiogram examinations, most recent 06/06/2021. Cardiomyopathy, CAD; Arrythmias:LBBB.  Sonographer:    Darlina Sicilian RDCS Referring Phys: [id number] OLADAPO ADEFESO  IMPRESSIONS   1. Left ventricular  ejection fraction, by estimation, is <20%. The left ventricle has severely decreased function. The left ventricle demonstrates global hypokinesis. The left ventricular internal cavity size was severely dilated. There is mild asymmetric left ventricular hypertrophy. Left ventricular diastolic parameters are indeterminate. There is no LV thrombus. 2. Right ventricular systolic function is mildly reduced. The right ventricular size is normal. There is moderately elevated pulmonary artery systolic pressure. The estimated right ventricular systolic pressure is 79.8 mmHg. 3. Left atrial size was severely dilated. 4. A small pericardial effusion is present. The pericardial effusion is anterior to the right ventricle and posterior to the left ventricle. 5. 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 Severe mitral valve regurgitation. No evidence of mitral stenosis. The mean mitral valve gradient is 5.0 mmHg. Central ventricular MR with A2-P2 worst pathology and pulmonary vein flow reversal. 6. Tricuspid valve regurgitation is mild to moderate. 7. The aortic valve is tricuspid. There is mild thickening of the aortic valve. Aortic valve regurgitation is mild to moderate. No aortic stenosis is present.  Comparison(s): Further decrease in LV size and function; increase in MR.  FINDINGS Left Ventricle: Left ventricular ejection fraction, by estimation, is <20%. The left ventricle has severely decreased function. The left ventricle demonstrates global hypokinesis. Definity contrast agent was given IV to delineate the left ventricular endocardial borders. The left ventricular internal cavity size was severely dilated. There is mild asymmetric left ventricular hypertrophy. Left ventricular diastolic parameters are indeterminate.  Right Ventricle: The right ventricular size is normal. No increase in right ventricular wall thickness. Right ventricular systolic function is mildly reduced. There is  moderately elevated pulmonary artery systolic pressure. The tricuspid regurgitant velocity is 3.19 m/s, and with an assumed right atrial pressure of 15 mmHg, the estimated right ventricular systolic pressure is 11.9 mmHg.  Left Atrium: Left atrial size was severely dilated.  Right Atrium: Right atrial size was normal in size.  Pericardium: A small pericardial effusion is present. The pericardial effusion is anterior to the right ventricle and posterior to the left ventricle.  Mitral Valve: MVA 5.8 cm2 by PHT. The mitral valve is rheumatic. Severe mitral valve regurgitation. No evidence of mitral valve stenosis. The mean mitral valve gradient is 5.0 mmHg with average heart rate of 94 bpm.  Tricuspid Valve: The tricuspid valve is grossly normal. Tricuspid valve regurgitation is mild to moderate. No evidence of tricuspid stenosis.  Aortic Valve: The aortic valve is tricuspid. There is mild thickening of the aortic valve. Aortic valve regurgitation is mild to moderate. Aortic regurgitation PHT measures 248 msec. No aortic stenosis is present.  Pulmonic Valve: The pulmonic valve was normal in structure. Pulmonic valve regurgitation is mild. No evidence of pulmonic stenosis.  Aorta: The aortic root, ascending aorta and aortic arch are all structurally normal, with no evidence of dilitation or obstruction and the aortic root and ascending aorta are structurally normal, with no evidence of dilitation.  IAS/Shunts: No atrial level shunt detected by color flow Doppler.   LEFT VENTRICLE PLAX 2D LVIDd:         6.40 cm      Diastology LVIDs:         5.90 cm      LV e' medial:    7.88 cm/s LV PW:         0.90 cm      LV E/e' medial:  17.6 LV IVS:        1.10 cm      LV e' lateral:   12.45 cm/s LVOT diam:     1.60 cm      LV E/e' lateral: 11.2 LV SV:         21 LV SV Index:   13 LVOT Area:     2.01 cm  LV Volumes (MOD) LV vol d, MOD A2C: 375.0 ml LV vol d, MOD A4C: 277.0 ml LV vol s, MOD A2C:  290.0 ml LV vol s, MOD A4C: 277.0 ml LV SV MOD A2C:     85.0 ml LV SV MOD A4C:     277.0 ml LV SV MOD BP:      66.4 ml  RIGHT VENTRICLE RV S prime:     5.33 cm/s  LEFT ATRIUM              Index        RIGHT ATRIUM           Index LA diam:        5.00 cm  3.25 cm/m   RA Area:     11.00 cm LA Vol (A2C):   113.0 ml 73.40 ml/m  RA Volume:   21.50 ml  13.97 ml/m LA Vol (A4C):   115.0 ml 74.70 ml/m LA Biplane Vol: 117.0 ml 76.00 ml/m AORTIC VALVE             PULMONIC VALVE LVOT Vmax:   54.90 cm/s  PR End Diast Vel: 8.41 msec LVOT Vmean:  40.400 cm/s RVOT Peak grad:   1 mmHg LVOT VTI:    0.103 m AI PHT:      248 msec  AORTA Ao Root diam: 2.70 cm Ao Asc diam:  3.00 cm  MITRAL VALVE                  TRICUSPID VALVE MV Area (PHT): 5.64 cm       TR Peak grad:   40.7 mmHg MV Mean grad:  5.0 mmHg       TR Vmax:        319.00 cm/s MV Decel Time: 135 msec MR Peak grad:    104.0 mmHg   SHUNTS MR Mean grad:    70.0 mmHg    Systemic VTI:  0.10 m MR Vmax:         510.00 cm/s  Systemic Diam: 1.60 cm MR Vmean:        403.0 cm/s   Pulmonic VTI:  0.072 m MR PISA:         2.26 cm MR PISA Eff ROA: 18 mm MR PISA Radius:  0.60 cm MV E velocity: 139.00 cm/s  Rudean Haskell MD Electronically signed by Rudean Haskell MD Signature Date/Time: 01/01/2022/11:23:40 AM    Final             EKG: NSR at 86 bpm with LBBB ~180 ms (personally reviewed)  TELEMETRY: NSR 90s (personally reviewed)  Assessment/Plan:  Acute on chronic combined CHF EF <20% on Echo this admission  GDMT has been somewhat limited by hypotension requiring midodrine.  Pt previously seen in 2017 and refused ICD. Willing to consider CRT-P Explained risks, benefits, and alternatives to PPM implantation, including but not limited to bleeding, infection, pneumothorax, pericardial effusion, lead dislodgement, heart attack, stroke, or death.  Pt verbalized understanding and agrees to proceed.  2. Acute Hypoxic  Respiratory Failure  3. Bilateral Pleural Effusions Remains on nasal canula Small effusions.  Continue IV Lasix 104m twice daily.    4. CAD S/p PCI to RCA and LAD in 2016 and more recently successful PCI with DES x2 to LAD 06/2021 Would hold plavix briefly for PPM implant.  Continue beta-blocker and statin.   5. 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   6. Acute on CKD Stage III Creatinine 1.93 on admission  Baseline around 0.9 to 1.3. Creatinine, ser  1.67* (11/01 0110) Continue to monitor with diuresis.    For questions or updates, please contact [place] consult [email or web address] for contact info under Cardiology/STEMI.  SJacalyn Lefevre PA-C  01/02/2022 10:42 AM  EP Attending  Patient seen and examined. Agree with the findings as  noted above. The patient is referred for consideration for biv PM upgrade. She is a pleasant 81 yo woman with chronic systolic heart failure, LBBB, and MR. She presented with acute on chronic systolic heart failure. She also has vocal cord paralysis. The patient has not had syncope. She saw Dr. Renaldo Reel 6 years ago and declinded an ICD. She has been on maximal guideline directed medical therapy. She also has CAD and a stent about 6 months ago. On exam she is a frail appearing 81 yo woman, NAD. Lungs with scattered basilar rales and CV with a RRR and split S2. Ext are without edema. Neuro is non-focal except for difficulty with her speech. Tele with NSR and ECG with NSR with LBBB.  A/P Acute on chronic systolic heart failure - she is mostly euvolemic. I have discussed the indications/risks/benefits/goals/expectations of Biv PPM insertion and she wishes to proceed. MR - she is not a candidate for an open procedure. Will need to reassess for possible mitra clip as an outpatient.   Carleene Overlie Zeanna Sunde,MD

## 2022-01-02 NOTE — Assessment & Plan Note (Signed)
Continue statin therapy.

## 2022-01-03 ENCOUNTER — Inpatient Hospital Stay (HOSPITAL_COMMUNITY): Payer: Medicare HMO

## 2022-01-03 ENCOUNTER — Encounter (HOSPITAL_COMMUNITY): Payer: Self-pay | Admitting: Internal Medicine

## 2022-01-03 DIAGNOSIS — I959 Hypotension, unspecified: Secondary | ICD-10-CM

## 2022-01-03 DIAGNOSIS — E782 Mixed hyperlipidemia: Secondary | ICD-10-CM | POA: Diagnosis not present

## 2022-01-03 DIAGNOSIS — I25810 Atherosclerosis of coronary artery bypass graft(s) without angina pectoris: Secondary | ICD-10-CM

## 2022-01-03 DIAGNOSIS — N179 Acute kidney failure, unspecified: Secondary | ICD-10-CM | POA: Diagnosis not present

## 2022-01-03 DIAGNOSIS — I5023 Acute on chronic systolic (congestive) heart failure: Secondary | ICD-10-CM | POA: Diagnosis not present

## 2022-01-03 LAB — BASIC METABOLIC PANEL
Anion gap: 12 (ref 5–15)
BUN: 18 mg/dL (ref 8–23)
CO2: 28 mmol/L (ref 22–32)
Calcium: 8.7 mg/dL — ABNORMAL LOW (ref 8.9–10.3)
Chloride: 100 mmol/L (ref 98–111)
Creatinine, Ser: 1.53 mg/dL — ABNORMAL HIGH (ref 0.44–1.00)
GFR, Estimated: 34 mL/min — ABNORMAL LOW (ref >60)
Glucose, Bld: 127 mg/dL — ABNORMAL HIGH (ref 70–99)
Potassium: 3 mmol/L — ABNORMAL LOW (ref 3.5–5.1)
Sodium: 140 mmol/L (ref 135–145)

## 2022-01-03 LAB — MAGNESIUM: Magnesium: 2.1 mg/dL (ref 1.7–2.4)

## 2022-01-03 MED ORDER — POTASSIUM CHLORIDE 20 MEQ PO PACK: 40.0000 meq | PACK | Freq: Once | ORAL | Status: AC

## 2022-01-03 MED ORDER — POTASSIUM CHLORIDE CRYS ER 20 MEQ PO TBCR
40.0000 meq | EXTENDED_RELEASE_TABLET | Freq: Once | ORAL | Status: AC
  Administered 2022-01-03: 40 meq via ORAL
  Filled 2022-01-03: qty 2

## 2022-01-03 MED ORDER — CLOPIDOGREL BISULFATE 75 MG PO TABS: 75.0000 mg | ORAL_TABLET | Freq: Every day | ORAL | 1 refills | Status: DC

## 2022-01-03 MED ORDER — FUROSEMIDE 10 MG/ML IJ SOLN
40.0000 mg | Freq: Two times a day (BID) | INTRAMUSCULAR | Status: DC
  Administered 2022-01-03 (×2): 40 mg via INTRAVENOUS
  Filled 2022-01-03 (×3): qty 4

## 2022-01-03 MED ORDER — CLOPIDOGREL BISULFATE 75 MG PO TABS: 75.0000 mg | ORAL_TABLET | Freq: Every day | ORAL | Status: DC

## 2022-01-03 NOTE — Progress Notes (Signed)
Progress Note   Patient: [name]:[id number] DOB: 1941-01-06 DOA: 12/31/2021     3 DOS: the patient was seen and examined on 01/03/2022   Brief hospital course: [name] was admitted to the hospital with the working diagnosis of heart failure exacerbation.   81 yo female with the past medical history of heart failure, coronary artery disease, moderate to severe MR, COPD and hypertension who presented with lower extremity edema. Patient reported several weeks of worsening lower extremity edema along with decreased mobility and dyspnea on exertion. On her initial physical examination her blood pressure was 121/73, HR 87, RR 17 and 02 saturation 91% on room air. Lungs with bilateral rales, heart with S1 and S2 present and rhythmic, abdomen with no distention and positive bilateral lower extremity edema.   Na 142, K 4,1 Cl 100 bicarbonate 25 glucose 108 bun 35 cr 1,93 BNP >4,500 High sensitive troponin 58 and 42  Wbc 8,0 hgb 14,9 plt 182   Chest radiograph with cardiomegaly, bilateral hilar vascular congestion and small bilateral pleural effusions.   EKG 86 bpm, normal axis, left bundle branch block, qtc 531, sinus rhythm, J point elevation V1 and V2 with no significant ST segment or T wave changes. Poor R R wave progression.   Patient was placed on furosemide for diuresis and midodrine for blood pressure support.  EP was consulted for possible biventricular pacing.   11/01 biventricular pacer implantation   Assessment and Plan: * Acute on chronic systolic CHF (congestive heart failure) (HCC) Echocardiogram with decreased LV systolic function <25%, global hypokinesis, severe dilatated internal cavity, asymmetric left ventricular hypertrophy, RV systolic function with mild reduction, RVSP 55.7 severe dilatation LA, small pericardial effusion, severe mitral regurgitation (rheumatic), mild to moderate TR, mild to moderate AI.   Urine output is 053 ml Systolic blood pressure 976 to 136  mmHg.   Diuresis with furosemide 40 mg bid Midodrine for blood pressure support.  B blocker with metoprolol.  Sp biventricular pacer in place.    Acute kidney injury superimposed on chronic kidney disease (HCC) CKD stage 3b. Hypokalemia  Renal function today with serum cr at 1,53, K is 3,0 and serum bicarbonate at 28. Na 140 and Mg 2,  Plan to continue K correction with Kc Follow up renal function in am  Continue diuresis with furosemide.    CAD (coronary artery disease) No chest pain or angina.  Continue with aspirin and rosuvastatin.   COPD (chronic obstructive pulmonary disease) (HCC) No clinical signs of exacerbation Continue oxymetry  Monitoring Patient with vocal cord paralysis.   Iron deficiency anemia Cell count has been stable.  Continue with oral iron supplementation.   Mild d dimer elevation with low pre test probability for pulmonary embolism,  [place] lower extremities was negative for DVT on 11/2021   Mixed hyperlipidemia Continue statin therapy.         Subjective: Patient very weak and deconditioned, dyspnea has improved, continue with lower extremity edema   Physical Exam: Vitals:   01/03/22 0820 01/03/22 0913 01/03/22 0914 01/03/22 1131  BP: 113/67   136/74  Pulse: 92   85  Resp: (!) 22 20  18   Temp: 97.6 F (36.4 C)   97.8 F (36.6 C)  TempSrc: Oral   Oral  SpO2: 97% (!) 86% 96% 98%  Weight:      Height:       Neurology awake and alert ENT with mild pallor Cardiovascular with S1 and S2 present and rhythmic, positive systolic  murmur at the apex Respiratory with scattered rales at the lower zones with no wheezing Abdomen with no distention  Positive lower extremity edema + to ++ Data Reviewed:    Family Communication: I spoke with patient's hisband at the bedside, we talked in detail about patient's condition, plan of care and prognosis and all questions were addressed.   Disposition: Status is: Inpatient Remains inpatient  appropriate because: heart failure   Planned Discharge Destination: Home      Author: Tawni Millers, MD 01/03/2022 3:04 PM  For on call review [email or web address].

## 2022-01-03 NOTE — Progress Notes (Signed)
SATURATION QUALIFICATIONS: (This note is used to comply with regulatory documentation for home oxygen)  Patient Saturations on Room Air at Rest = 85%  Patient Saturations on Room Air while Ambulating = 85%  Patient Saturations on 2 Liters of oxygen while Ambulating = 96%  Please briefly explain why patient needs home oxygen:

## 2022-01-03 NOTE — Progress Notes (Signed)
Rounding Note    Patient Name: [name] Date of Encounter: 01/03/2022  [place] Cardiologist: Ida Rogue, MD   Subjective   No acute overnight events. Patient states she is feeling better today. Her breathing has improved and she feels close to her baseline. However, she is still currently on 3L of O2 via nasal cannula. Will try to wean this off today. No chest pain or palpitations. She is very eager to go home. EP to see today for consideration of BiV PPM vs ICD.  Inpatient Medications    Scheduled Meds:  aspirin  81 mg Oral Daily   citalopram  10 mg Oral Daily   [START ON 01/10/2022] clopidogrel  75 mg Oral Daily   ferrous sulfate  325 mg Oral Q breakfast   furosemide  40 mg Intravenous BID   ipratropium  2 spray Each Nare QID   metoprolol succinate  12.5 mg Oral Daily   midodrine  2.5 mg Oral TID WC   mupirocin ointment   Nasal BID   potassium chloride  40 mEq Oral Once   Or   potassium chloride  40 mEq Oral Once   potassium chloride  40 mEq Oral Once   Or   potassium chloride  40 mEq Oral Once   rosuvastatin  40 mg Oral Daily   senna-docusate  1 tablet Oral BID   Continuous Infusions:  sodium chloride Stopped (01/03/22 0602)    ceFAZolin (ANCEF) IV Stopped (01/03/22 0527)   PRN Meds: sodium chloride, acetaminophen, albuterol, ALPRAZolam, oxyCODONE   Vital Signs    Vitals:   01/03/22 0445 01/03/22 0820 01/03/22 0913 01/03/22 0914  BP: 132/65 113/67    Pulse:  92    Resp: (!) 22 (!) 22 20   Temp: (!) 97.5 F (36.4 C) 97.6 F (36.4 C)    TempSrc: Oral Oral    SpO2: 97% 97% (!) 86% 96%  Weight: 56.5 kg     Height:        Intake/Output Summary (Last 24 hours) at 01/03/2022 1103 Last data filed at 01/03/2022 3016 Gross per 24 hour  Intake 848.64 ml  Output 600 ml  Net 248.64 ml      01/03/2022    4:45 AM 01/02/2022    1:00 AM 01/01/2022    4:58 AM  Last 3 Weights  Weight (lbs) 124 lb 9 oz 124 lb 8 oz 127 lb 1.6 oz  Weight (kg)  56.5 kg 56.473 kg 57.652 kg      Telemetry    Sinus rhythm with rates in the 80s to low 100s. PVCs and one 6 beat run of NSVT noted. - Personally Reviewed  ECG    No new ECG tracing today. - Personally Reviewed  Physical Exam   Physical Exam per MD:  GEN:  No acute distress.   Neck: Possibly slightly elevated JVD. Cardiac: RRR. Soft II/VI systolic murmur noted at the apex. Respiratory: No increased work of breathing. Clear to ausculation bilaterally. GI: Soft, non-distended, and non-tender. MS: No lower extremity edema. No deformity. Skin: Warm and dry. Neuro:  No focal deficits. Psych: Normal affect. Responds appropriately.   Labs    High Sensitivity Troponin:   Recent Labs  Lab 12/31/21 1119 12/31/21 1319  TROPONINIHS 58* 42*     Chemistry Recent Labs  Lab 12/31/21 1119 01/01/22 0049 01/02/22 0110 01/03/22 0052  NA 142 144 142 140  K 4.1 3.5 3.3* 3.0*  CL 100 100 104 100  CO2  25 27 26 28   GLUCOSE 108* 111* 99 127*  BUN 35* 31* 24* 18  CREATININE 1.93* 1.85* 1.67* 1.53*  CALCIUM 9.6 9.7 9.2 8.7*  MG  --   --   --  2.1  PROT 6.7 6.5  --   --   ALBUMIN 3.9 3.8  --   --   AST 37 29  --   --   ALT 26 29  --   --   ALKPHOS 62 62  --   --   BILITOT 0.7 0.9  --   --   GFRNONAA 26* 27* 31* 34*  ANIONGAP 17* 17* 12 12    Lipids  Recent Labs  Lab 01/02/22 0110  CHOL 101  TRIG 93  HDL 40*  LDLCALC 42  CHOLHDL 2.5    Hematology Recent Labs  Lab 12/31/21 1119 01/01/22 0049  WBC 8.0 8.1  RBC 4.53 4.41  HGB 14.9 14.3  HCT 46.3* 44.2  MCV 102.2* 100.2*  MCH 32.9 32.4  MCHC 32.2 32.4  RDW 16.8* 16.7*  PLT 182 174   Thyroid No results for input(s): "TSH", "FREET4" in the last 168 hours.  BNP Recent Labs  Lab 12/31/21 1120  BNP >4,500.0*    DDimer  Recent Labs  Lab 12/31/21 1342  DDIMER 1.84*     Radiology    DG Chest 2 View  Result Date: 01/03/2022 CLINICAL DATA:  Pacemaker placement. EXAM: CHEST - 2 VIEW COMPARISON:  January 01, 2022. FINDINGS: Stable cardiomegaly. Interval placement of left-sided pacemaker with leads in grossly good position. No pneumothorax is noted. Bibasilar atelectasis or edema is noted with associated pleural effusions. No definite acute osseous abnormality is noted. IMPRESSION: Interval placement of left-sided pacemaker with leads in grossly good position. Stable bibasilar opacities as described above. Electronically Signed   By: Marijo Conception M.D.   On: 01/03/2022 08:21   EP PPM/ICD IMPLANT  Result Date: 01/03/2022 CONCLUSIONS: 1. Successful implantation of a St. Jude BiV pacemaker for symptomatic left bundle/ AV block and chronic systolic heart failure. A PPM was placed due to the patient's preference. 2. No early apparent complications. Cristopher Peru, MD 7:10 PM 01/02/2022    DG Chest 2 View  Result Date: 01/01/2022 CLINICAL DATA:  Pleural effusion EXAM: CHEST - 2 VIEW COMPARISON:  Chest x-ray 12/04/2021 FINDINGS: The heart is enlarged, unchanged. There are small bilateral pleural effusions and bibasilar infiltrates similar to prior. There is no evidence for pneumothorax or acute fracture. IMPRESSION: Stable cardiomegaly with small bilateral pleural effusions and bibasilar infiltrates. Electronically Signed   By: Ronney Asters M.D.   On: 01/01/2022 17:56    Cardiac Studies   Echocardiogram 06/06/2021: Impressions: 1. Left ventricular ejection fraction, by estimation, is 25 to 30%. The  left ventricle has severely decreased function. The left ventricle  demonstrates global hypokinesis. The left ventricular internal cavity size  was mildly dilated. There is moderate  asymmetric left ventricular hypertrophy of the septal segment. Left  ventricular diastolic parameters are consistent with Grade II diastolic  dysfunction (pseudonormalization).   2. Right ventricular systolic function is low normal. The right  ventricular size is mildly enlarged.   3. Left atrial size was moderately dilated.   4.  The mitral valve is normal in structure. Moderate mitral valve  regurgitation.   5. The aortic valve is tricuspid. Aortic valve regurgitation is trivial.  _______________   Right/ Left Cardiac Catheterization 06/13/2021: Conclusions: Severe single-vessel coronary artery disease with sequential 80-90% proximal/mid  LAD stenoses flanking previously placed, which are highly significant by iFR and involve D1 and D2. Mild-moderate, nonobstructive CAD involving ramus intermedius and RCA. Widely patent mid LAD and mid RCA stents. Mildly elevated left and right heart filling pressures. Mild-moderate pulmonary hypertension. Mildly-moderately reduced Fick cardiac output/index. Successful PCI to proximal LAD using Onyx Frontier 3.0 x 12 mm drug-eluting stent (jailing D1) with 0% residual stenosis and TIMI-3 flow. Successful PCI to mid LAD using Onyx Frontier 2.5 x 15 mm drug-eluting stent (jailing D2) with angioplasty of ostium of D2.  There is 0% residual stenosis in the LAD and 80% residual stenosis at the ostium of D2 with TIMI-3 flow. Challenging intervention via right radial approach due to tortuosity of the right subclavian artery and radial artery vasospasm.  Consider alternative access for future catheterizations.   Recommendations: Dual antiplatelet therapy with aspirin and clopidogrel for at least 12 months. Aggressive secondary prevention of coronary artery disease. Continued gentle diuresis. Escalate goal-directed medical therapy for acute on chronic HFrEF. Diagnostic Dominance: Right  Intervention    _______________  Echocardiogram 01/01/2022: Impressions: 1. Left ventricular ejection fraction, by estimation, is <20%. The left  ventricle has severely decreased function. The left ventricle demonstrates  global hypokinesis. The left ventricular internal cavity size was severely  dilated. There is mild  asymmetric left ventricular hypertrophy. Left ventricular diastolic  parameters  are indeterminate. There is no LV thrombus.   2. Right ventricular systolic function is mildly reduced. The right  ventricular size is normal. There is moderately elevated pulmonary artery  systolic pressure. The estimated right ventricular systolic pressure is  84.1 mmHg.   3. Left atrial size was severely dilated.   4. A small pericardial effusion is present. The pericardial effusion is  anterior to the right ventricle and posterior to the left ventricle.   5. 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 Severe  mitral valve regurgitation. No evidence of mitral stenosis. The mean  mitral valve gradient is 5.0 mmHg. Central ventricular MR with A2-P2 worst  pathology and pulmonary vein flow  reversal.   6. Tricuspid valve regurgitation is mild to moderate.   7. The aortic valve is tricuspid. There is mild thickening of the aortic  valve. Aortic valve regurgitation is mild to moderate. No aortic stenosis  is present.   Comparison(s): Further decrease in LV size and function; increase in MR.   Patient Profile     81 y.o. female with a history of CAD s/p prior stenting to mid RCA and proximal LAD in 2016 and more recently successful PC with DES to proximal LAD and DES to mid LAD in 06/2021 (overlapping prior stent on both sides), ischemic cardiomyopathy/ chronic combined CHF with EF of 25-30% on Echo in 06/2021, moderate to severe MR, LBBB, hypotension on Midodrine, hyperlipidemia, COPD,  Hepatitis C, chronic anemia, chronic back pain, and vocal cord paralysis, who was admitted on 12/31/2021 for acute on chronic CHF with acute hypoxic respiratory failure after presenting with worsening shortness of breath.   Assessment & Plan     Acute on Chronic HFrEF  BNP >4,500. Chest x-ray showed mild to moderate pleural effusion increased from 11/2021 and mild bilateral interstitial thickening slightly increased from 06/2021. Repeat Echo this admission showed LVEF of <20% with global  hypokinesis and mild asymmetric LVH but no LV thrombus, normal RV with mildly reduced systolic function, severe left atrial enlargement, severe MR, mild to moderate TR, and mild to moderate AI. EF was 25-30% on last Echo  in 06/2021. Net negative 1.5 L so far.  She underwent BiV pacer placement yesterday  Hopefullly this will help with forward output EP service is seeing patient  - Volume status has improved from admit  But, today O2 sats dropped without O2 on  Will give IV lasix  .  - GDMT limited due to borderline BP requiring Midodrine at home. Continue Toprol-XL 12.5mg  daily. No ACEi/ARB/ARNI or MRA due to BP and renal function.  With new device I would not add Jardiance for now   CAD S/p PCI to RCA and LAD in 2016 and more recently successful PCI with DES x2 to LAD. - No angina. - On DAPT with Aspirin and Plavix. Will hold Plavix in anticipation for possible PPM/ICD. - Continue beta-blocker and statin.    MR  Echo this admission showed progression to severe MR.   Will follow now that hs BiV    Hypotension Previously had problems with hypertension but looks like more recently has had issues with hypotension and is on low dose Midodrine at home.  - BP stable today. - Continue Toprol-XL 12.5mg  daily. - Will stop midodrine today    Hyperlipidemia Lipid panel this admission: Total Cholesterol 101, Triglycerides 93, HDL 40, LDL 42. LDL goal <70. - Continue Crestor 40mg  daily.   Acute on CKD Stage III Creatinine 1.93 and has slowly been trending down with diuresis. Creatinine 1.53 today.  Baseline around 0.9 to 1.3. - Continue to monitor with diuresis.  Close to d.c   For questions or updates, please contact [place] Please consult [email or web address] for contact info under        Signed, Dorris Carnes, MD  01/03/2022, 11:03 AM

## 2022-01-03 NOTE — Progress Notes (Signed)
Patient had an episode of 25 Vtachs. She was process in the process of transferring from bed to chair. RN and NT was there to assist her. She was asymptomatic. Provider notified.

## 2022-01-03 NOTE — Progress Notes (Addendum)
Electrophysiology Rounding Note  Patient Name: [name] Date of Encounter: 01/03/2022  Primary Cardiologist: Ida Rogue, MD Electrophysiologist: Cristopher Peru, MD   Subjective   Pt is feeling OK this am, though she is sore and slept poorly.  Inpatient Medications    Scheduled Meds:  aspirin  81 mg Oral Daily   citalopram  10 mg Oral Daily   ferrous sulfate  325 mg Oral Q breakfast   ipratropium  2 spray Each Nare QID   metoprolol succinate  12.5 mg Oral Daily   midodrine  2.5 mg Oral TID WC   mupirocin ointment   Nasal BID   potassium chloride  40 mEq Oral Once   rosuvastatin  40 mg Oral Daily   senna-docusate  1 tablet Oral BID   torsemide  20 mg Oral Daily   Continuous Infusions:  sodium chloride Stopped (01/03/22 0602)    ceFAZolin (ANCEF) IV Stopped (01/03/22 0527)   PRN Meds: sodium chloride, acetaminophen, albuterol, ALPRAZolam, oxyCODONE   Vital Signs    Vitals:   01/02/22 1800 01/02/22 1939 01/03/22 0445 01/03/22 0820  BP: (!) 101/90 124/64 132/65 113/67  Pulse: 85 83  92  Resp: (!) 22 18 (!) 22 (!) 22  Temp:  97.8 F (36.6 C) (!) 97.5 F (36.4 C) 97.6 F (36.4 C)  TempSrc:  Oral Oral Oral  SpO2: 99% 99% 97% 97%  Weight:   56.5 kg   Height:        Intake/Output Summary (Last 24 hours) at 01/03/2022 0836 Last data filed at 01/03/2022 2130 Gross per 24 hour  Intake 848.64 ml  Output 950 ml  Net -101.36 ml   Filed Weights   01/01/22 0458 01/02/22 0100 01/03/22 0445  Weight: 57.7 kg 56.5 kg 56.5 kg    Physical Exam    GEN- The patient is well appearing, alert and oriented x 3 today.   Head- normocephalic, atraumatic Eyes-  Sclera clear, conjunctiva pink Ears- hearing intact Oropharynx- clear Neck- supple Lungs- Clear to ausculation bilaterally, normal work of breathing Heart- Regular rate and rhythm, no murmurs, rubs or gallops GI- soft, NT, ND, + BS Extremities- no clubbing or cyanosis. No edema Skin- no rash or lesion Psych-  euthymic mood, full affect Neuro- strength and sensation are intact  Labs    CBC Recent Labs    12/31/21 1119 01/01/22 0049  WBC 8.0 8.1  NEUTROABS 5.7  --   HGB 14.9 14.3  HCT 46.3* 44.2  MCV 102.2* 100.2*  PLT 182 865   Basic Metabolic Panel Recent Labs    01/02/22 0110 01/03/22 0052  NA 142 140  K 3.3* 3.0*  CL 104 100  CO2 26 28  GLUCOSE 99 127*  BUN 24* 18  CREATININE 1.67* 1.53*  CALCIUM 9.2 8.7*  MG  --  2.1   Liver Function Tests Recent Labs    12/31/21 1119 01/01/22 0049  AST 37 29  ALT 26 29  ALKPHOS 62 62  BILITOT 0.7 0.9  PROT 6.7 6.5  ALBUMIN 3.9 3.8   Recent Labs    12/31/21 1119  LIPASE 52*   Cardiac Enzymes No results for input(s): "CKTOTAL", "CKMB", "CKMBINDEX", "TROPONINI" in the last 72 hours.   Telemetry    V pacing in 80s (personally reviewed)  Radiology    DG Chest 2 View  Result Date: 01/03/2022 CLINICAL DATA:  Pacemaker placement. EXAM: CHEST - 2 VIEW COMPARISON:  January 01, 2022. FINDINGS: Stable cardiomegaly. Interval placement of left-sided  pacemaker with leads in grossly good position. No pneumothorax is noted. Bibasilar atelectasis or edema is noted with associated pleural effusions. No definite acute osseous abnormality is noted. IMPRESSION: Interval placement of left-sided pacemaker with leads in grossly good position. Stable bibasilar opacities as described above. Electronically Signed   By: Marijo Conception M.D.   On: 01/03/2022 08:21   EP PPM/ICD IMPLANT  Result Date: 01/03/2022 CONCLUSIONS: 1. Successful implantation of a St. Jude BiV pacemaker for symptomatic left bundle/ AV block and chronic systolic heart failure. A PPM was placed due to the patient's preference. 2. No early apparent complications. Cristopher Peru, MD 7:10 PM 01/02/2022    DG Chest 2 View  Result Date: 01/01/2022 CLINICAL DATA:  Pleural effusion EXAM: CHEST - 2 VIEW COMPARISON:  Chest x-ray 12/04/2021 FINDINGS: The heart is enlarged, unchanged.  There are small bilateral pleural effusions and bibasilar infiltrates similar to prior. There is no evidence for pneumothorax or acute fracture. IMPRESSION: Stable cardiomegaly with small bilateral pleural effusions and bibasilar infiltrates. Electronically Signed   By: Ronney Asters M.D.   On: 01/01/2022 17:56   ECHOCARDIOGRAM COMPLETE  Result Date: 01/01/2022    ECHOCARDIOGRAM REPORT   Patient Name:   [name] Date of Exam: 01/01/2022 Medical Rec #:  [id number]     Height:       60.0 in Accession #:    [id number]    Weight:       127.1 lb Date of Birth:  February 07, 1941     BSA:          1.539 m Patient Age:    81 years      BP:           123/70 mmHg Patient Gender: F             HR:           96 bpm. Exam Location:  Inpatient Procedure: 2D Echo, Cardiac Doppler, Color Doppler and Intracardiac            Opacification Agent Indications:    CHF  History:        Patient has prior history of Echocardiogram examinations, most                 recent 06/06/2021. Cardiomyopathy, CAD; Arrythmias:LBBB.  Sonographer:    Darlina Sicilian RDCS Referring Phys: [id number] OLADAPO ADEFESO IMPRESSIONS  1. Left ventricular ejection fraction, by estimation, is <20%. The left ventricle has severely decreased function. The left ventricle demonstrates global hypokinesis. The left ventricular internal cavity size was severely dilated. There is mild asymmetric left ventricular hypertrophy. Left ventricular diastolic parameters are indeterminate. There is no LV thrombus.  2. Right ventricular systolic function is mildly reduced. The right ventricular size is normal. There is moderately elevated pulmonary artery systolic pressure. The estimated right ventricular systolic pressure is 06.2 mmHg.  3. Left atrial size was severely dilated.  4. A small pericardial effusion is present. The pericardial effusion is anterior to the right ventricle and posterior to the left ventricle.  5. 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  Severe mitral valve regurgitation. No evidence of mitral stenosis. The mean mitral valve gradient is 5.0 mmHg. Central ventricular MR with A2-P2 worst pathology and pulmonary vein flow reversal.  6. Tricuspid valve regurgitation is mild to moderate.  7. The aortic valve is tricuspid. There is mild thickening of the aortic valve. Aortic valve regurgitation is mild to moderate. No aortic stenosis is present. Comparison(s):  Further decrease in LV size and function; increase in MR. FINDINGS  Left Ventricle: Left ventricular ejection fraction, by estimation, is <20%. The left ventricle has severely decreased function. The left ventricle demonstrates global hypokinesis. Definity contrast agent was given IV to delineate the left ventricular endocardial borders. The left ventricular internal cavity size was severely dilated. There is mild asymmetric left ventricular hypertrophy. Left ventricular diastolic parameters are indeterminate. Right Ventricle: The right ventricular size is normal. No increase in right ventricular wall thickness. Right ventricular systolic function is mildly reduced. There is moderately elevated pulmonary artery systolic pressure. The tricuspid regurgitant velocity is 3.19 m/s, and with an assumed right atrial pressure of 15 mmHg, the estimated right ventricular systolic pressure is 21.1 mmHg. Left Atrium: Left atrial size was severely dilated. Right Atrium: Right atrial size was normal in size. Pericardium: A small pericardial effusion is present. The pericardial effusion is anterior to the right ventricle and posterior to the left ventricle. Mitral Valve: MVA 5.8 cm2 by PHT. The mitral valve is rheumatic. Severe mitral valve regurgitation. No evidence of mitral valve stenosis. The mean mitral valve gradient is 5.0 mmHg with average heart rate of 94 bpm. Tricuspid Valve: The tricuspid valve is grossly normal. Tricuspid valve regurgitation is mild to moderate. No evidence of tricuspid stenosis.  Aortic Valve: The aortic valve is tricuspid. There is mild thickening of the aortic valve. Aortic valve regurgitation is mild to moderate. Aortic regurgitation PHT measures 248 msec. No aortic stenosis is present. Pulmonic Valve: The pulmonic valve was normal in structure. Pulmonic valve regurgitation is mild. No evidence of pulmonic stenosis. Aorta: The aortic root, ascending aorta and aortic arch are all structurally normal, with no evidence of dilitation or obstruction and the aortic root and ascending aorta are structurally normal, with no evidence of dilitation. IAS/Shunts: No atrial level shunt detected by color flow Doppler.  LEFT VENTRICLE PLAX 2D LVIDd:         6.40 cm      Diastology LVIDs:         5.90 cm      LV e' medial:    7.88 cm/s LV PW:         0.90 cm      LV E/e' medial:  17.6 LV IVS:        1.10 cm      LV e' lateral:   12.45 cm/s LVOT diam:     1.60 cm      LV E/e' lateral: 11.2 LV SV:         21 LV SV Index:   13 LVOT Area:     2.01 cm  LV Volumes (MOD) LV vol d, MOD A2C: 375.0 ml LV vol d, MOD A4C: 277.0 ml LV vol s, MOD A2C: 290.0 ml LV vol s, MOD A4C: 277.0 ml LV SV MOD A2C:     85.0 ml LV SV MOD A4C:     277.0 ml LV SV MOD BP:      66.4 ml RIGHT VENTRICLE RV S prime:     5.33 cm/s LEFT ATRIUM              Index        RIGHT ATRIUM           Index LA diam:        5.00 cm  3.25 cm/m   RA Area:     11.00 cm LA Vol (A2C):   113.0 ml 73.40 ml/m  RA Volume:  21.50 ml  13.97 ml/m LA Vol (A4C):   115.0 ml 74.70 ml/m LA Biplane Vol: 117.0 ml 76.00 ml/m  AORTIC VALVE             PULMONIC VALVE LVOT Vmax:   54.90 cm/s  PR End Diast Vel: 8.41 msec LVOT Vmean:  40.400 cm/s RVOT Peak grad:   1 mmHg LVOT VTI:    0.103 m AI PHT:      248 msec  AORTA Ao Root diam: 2.70 cm Ao Asc diam:  3.00 cm MITRAL VALVE                  TRICUSPID VALVE MV Area (PHT): 5.64 cm       TR Peak grad:   40.7 mmHg MV Mean grad:  5.0 mmHg       TR Vmax:        319.00 cm/s MV Decel Time: 135 msec MR Peak grad:    104.0  mmHg   SHUNTS MR Mean grad:    70.0 mmHg    Systemic VTI:  0.10 m MR Vmax:         510.00 cm/s  Systemic Diam: 1.60 cm MR Vmean:        403.0 cm/s   Pulmonic VTI:  0.072 m MR PISA:         2.26 cm MR PISA Eff ROA: 18 mm MR PISA Radius:  0.60 cm MV E velocity: 139.00 cm/s Rudean Haskell MD Electronically signed by Rudean Haskell MD Signature Date/Time: 01/01/2022/11:23:40 AM    Final     Patient Profile     TULA SCHRYVER is a 81 y.o. female with a history of ICM with chronic combined heart failure LVEF 25%, CAD with PCI to mid RCA and proximal LAD in 2016 (intolerant to brilinta), moderate to severe MR, CKD 3, LBBB, vocal cord paralysis, and hypotension who is being seen today for the evaluation of CHF and LBBB at the request of Dr. Harrington Challenger.   Assessment & Plan    Acute on chronic combined CHF EF <20% on Echo this admission  GDMT has been somewhat limited by hypotension requiring midodrine.  S/p CRT-P with Left bundle and LV lead 01/02/2022 by Dr. Lovena Le Device interrogation stable today.  Wound care and arm restrictions will be reviewed with pt later this am, once we remove pressure dressing.    2. Acute Hypoxic Respiratory Failure  3. Bilateral Pleural Effusions Improving. Per primary Transitioned to torsemide.   4. CAD S/p PCI to RCA and LAD in 2016 and more recently successful PCI with DES x2 to LAD 06/2021 Would hold plavix briefly for PPM implant. Will discuss timing with Dr. Lovena Le once visualized post pressure dressing removal.  Continue beta-blocker and statin.   5. 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  Not felt to be candidate for open procedures.    6. Acute on CKD Stage III Creatinine further improved to 1.53 this am.   EP will see as needed while remains here. Usual follow up in place. Will clarify timing of plavix resumption.   For questions or updates, please contact [place] Please consult [email or web address] for contact  info under Cardiology/STEMI.  Signed, Shirley Friar, PA-C  01/03/2022, 8:36 AM   EP Attending  Patient seen and examined. Agree with above. The patient is doing well after biv ppm upgrade. Interrogation of her PM under my direction demonstrates normal biv PPM function. Her CXR demonstrates normal  Biv PM function. Her PM pocket has minimal hematoma. I would recommend holding the plavix for 7 days. Usual PM followup. Continue other CHF meds. Ok from my perspective to discharge.   Carleene Overlie Rudine Rieger,MD

## 2022-01-03 NOTE — Progress Notes (Signed)
Patient is for Xray. Asked for pain medication but wants a smaller dose than 5mg  for Oxycodone. Informed her that she doesn't have a smaller dose available for Oxycodone but offered her the Tylenol instead. She agreed and given her 1 tablet.

## 2022-01-03 NOTE — Plan of Care (Signed)
?  Problem: Clinical Measurements: ?Goal: Will remain free from infection ?Outcome: Progressing ?  ?

## 2022-01-03 NOTE — Discharge Instructions (Addendum)
After Your Pacemaker   You have a [employer] Pacemaker  ACTIVITY Do not lift your arm above shoulder height for 1 week after your procedure. After 7 days, you may progress as below.  You should remove your sling 24 hours after your procedure, unless otherwise instructed by your provider.     Thursday January 10, 2022  Friday January 11, 2022 Saturday January 12, 2022 Sunday January 13, 2022   Do not lift, push, pull, or carry anything over 10 pounds with the affected arm until 6 weeks (Thursday February 14, 2022 ) after your procedure.   You may drive AFTER your wound check, unless you have been told otherwise by your provider.   Ask your healthcare provider when you can go back to work   INCISION/Dressing Resume Plavix Thursday, 11/9   If large square, outer bandage is left in place, this can be removed after 24 hours from your procedure. Do not remove steri-strips or glue as below.   Monitor your Pacemaker site for redness, swelling, and drainage. Call the device clinic at [phone] if you experience these symptoms or fever/chills.  If your incision is sealed with Steri-strips or staples, you may shower 7 days after your procedure or when told by your provider. Do not remove the steri-strips or let the shower hit directly on your site. You may wash around your site with soap and water.    If you were discharged in a sling, please do not wear this during the day more than 48 hours after your surgery unless otherwise instructed. This may increase the risk of stiffness and soreness in your shoulder.   Avoid lotions, ointments, or perfumes over your incision until it is well-healed.  You may use a hot tub or a pool AFTER your wound check appointment if the incision is completely closed.  Pacemaker Alerts:  Some alerts are vibratory and others beep. These are NOT emergencies. Please call our office to let us know. If this occurs at night or on weekends, it can wait until the next  business day. Send a remote transmission.  If your device is capable of reading fluid status (for heart failure), you will be offered monthly monitoring to review this with you.   DEVICE MANAGEMENT Remote monitoring is used to monitor your pacemaker from home. This monitoring is scheduled every 91 days by our office. It allows [place] to keep an eye on the functioning of your device to ensure it is working properly. You will routinely see your Electrophysiologist annually (more often if necessary).   You should receive your ID card for your new device in 4-8 weeks. Keep this card with you at all times once received. Consider wearing a medical alert bracelet or necklace.  Your Pacemaker may be MRI compatible. This will be discussed at your next office visit/wound check.  You should avoid contact with strong electric or magnetic fields.   Do not use amateur (ham) radio equipment or electric (arc) welding torches. MP3 player headphones with magnets should not be used. Some devices are safe to use if held at least 12 inches (30 cm) from your Pacemaker. These include power tools, lawn mowers, and speakers. If you are unsure if something is safe to use, ask your health care provider.  When using your cell phone, hold it to the ear that is on the opposite side from the Pacemaker. Do not leave your cell phone in a pocket over the Pacemaker.  You may safely use electric blankets, heating  pads, computers, and microwave ovens.  Call the office right away if: You have chest pain. You feel more short of breath than you have felt before. You feel more light-headed than you have felt before. Your incision starts to open up.  This information is not intended to replace advice given to you by your health care provider. Make sure you discuss any questions you have with your health care provider.

## 2022-01-03 NOTE — Plan of Care (Signed)
  Problem: Education: Goal: Knowledge of General Education information will improve Description Including pain rating scale, medication(s)/side effects and non-pharmacologic comfort measures Outcome: Progressing   

## 2022-01-04 ENCOUNTER — Other Ambulatory Visit (HOSPITAL_COMMUNITY): Payer: Self-pay

## 2022-01-04 ENCOUNTER — Other Ambulatory Visit: Payer: Self-pay | Admitting: Cardiovascular Disease

## 2022-01-04 DIAGNOSIS — E782 Mixed hyperlipidemia: Secondary | ICD-10-CM | POA: Diagnosis not present

## 2022-01-04 DIAGNOSIS — N189 Chronic kidney disease, unspecified: Secondary | ICD-10-CM | POA: Diagnosis not present

## 2022-01-04 DIAGNOSIS — I25810 Atherosclerosis of coronary artery bypass graft(s) without angina pectoris: Secondary | ICD-10-CM | POA: Diagnosis not present

## 2022-01-04 DIAGNOSIS — I5023 Acute on chronic systolic (congestive) heart failure: Secondary | ICD-10-CM | POA: Diagnosis not present

## 2022-01-04 DIAGNOSIS — N183 Chronic kidney disease, stage 3 unspecified: Secondary | ICD-10-CM

## 2022-01-04 DIAGNOSIS — D509 Iron deficiency anemia, unspecified: Secondary | ICD-10-CM | POA: Diagnosis not present

## 2022-01-04 DIAGNOSIS — N179 Acute kidney failure, unspecified: Secondary | ICD-10-CM | POA: Diagnosis not present

## 2022-01-04 LAB — BASIC METABOLIC PANEL
Anion gap: 11 (ref 5–15)
BUN: 14 mg/dL (ref 8–23)
CO2: 29 mmol/L (ref 22–32)
Calcium: 9.1 mg/dL (ref 8.9–10.3)
Chloride: 102 mmol/L (ref 98–111)
Creatinine, Ser: 1.38 mg/dL — ABNORMAL HIGH (ref 0.44–1.00)
GFR, Estimated: 39 mL/min — ABNORMAL LOW (ref >60)
Glucose, Bld: 151 mg/dL — ABNORMAL HIGH (ref 70–99)
Potassium: 4.2 mmol/L (ref 3.5–5.1)
Sodium: 142 mmol/L (ref 135–145)

## 2022-01-04 MED ORDER — FERROUS SULFATE 325 (65 FE) MG PO TABS
325.0000 mg | ORAL_TABLET | Freq: Every day | ORAL | 0 refills | Status: DC
  Filled 2022-01-04: qty 30, 30d supply, fill #0

## 2022-01-04 MED ORDER — TORSEMIDE 20 MG PO TABS
20.0000 mg | ORAL_TABLET | Freq: Every day | ORAL | Status: DC
  Filled 2022-01-04: qty 1

## 2022-01-04 MED ORDER — TORSEMIDE 20 MG PO TABS
20.0000 mg | ORAL_TABLET | Freq: Every day | ORAL | 0 refills | Status: DC
  Filled 2022-01-04: qty 30, 30d supply, fill #0

## 2022-01-04 NOTE — Discharge Summary (Signed)
Physician Discharge Summary   Patient: [name] MRN: [id number] DOB: 01/07/1941  Admit date:     12/31/2021  Discharge date: 01/04/22  Discharge Physician: Jimmy Picket Nicholaos Schippers   PCP: Carollee Leitz, MD   Recommendations at discharge:   Patient had a biventricular pacer in place, to improve synchrony in systolic function. Continue torsemide 20 mg daily and plan to increase to bid in case of weight gain 2 to 3 lbs in 24 or 5 lbs in 7 days.  Follow up renal function as outpatient.  Follow with Dr Volanda Napoleon in 7 to 10 days.   Discharge Diagnoses: Principal Problem:   Acute on chronic systolic CHF (congestive heart failure) (HCC) Active Problems:   Acute kidney injury superimposed on chronic kidney disease (HCC)   CAD (coronary artery disease)   COPD (chronic obstructive pulmonary disease) (HCC)   Iron deficiency anemia   Mixed hyperlipidemia  Resolved Problems:   * No resolved hospital problems. [facility] Course: Mrs. Houde was admitted to the hospital with the working diagnosis of heart failure exacerbation.   81 yo female with the past medical history of heart failure, coronary artery disease, moderate to severe MR, COPD and hypertension who presented with lower extremity edema. Patient reported several weeks of worsening lower extremity edema along with decreased mobility and dyspnea on exertion. On her initial physical examination her blood pressure was 121/73, HR 87, RR 17 and 02 saturation 91% on room air. Lungs with bilateral rales, heart with S1 and S2 present and rhythmic, abdomen with no distention and positive bilateral lower extremity edema.   Na 142, K 4,1 Cl 100 bicarbonate 25 glucose 108 bun 35 cr 1,93 BNP >4,500 High sensitive troponin 58 and 42  Wbc 8,0 hgb 14,9 plt 182   Chest radiograph with cardiomegaly, bilateral hilar vascular congestion and small bilateral pleural effusions.   EKG 86 bpm, normal axis, left bundle branch block, qtc 531, sinus rhythm, J  point elevation V1 and V2 with no significant ST segment or T wave changes. Poor R R wave progression.   Patient was placed on furosemide for diuresis and midodrine for blood pressure support.  EP was consulted for possible biventricular pacing.   11/01 biventricular pacer implantation  11/03 plan to discharge home and follow up as outpatient.   Assessment and Plan: * Acute on chronic systolic CHF (congestive heart failure) (HCC) Echocardiogram with decreased LV systolic function <90%, global hypokinesis, severe dilatated internal cavity, asymmetric left ventricular hypertrophy, RV systolic function with mild reduction, RVSP 55.7 severe dilatation LA, small pericardial effusion, severe mitral regurgitation (rheumatic), mild to moderate TR, mild to moderate AI.   Patient was placed on IV furosemide for diuresis, negative fluid balance was achieved, -3727 ml, with improvement of her symptoms.   Patient had a biventricular pacer implanted to improve systolic synchrony.  She has progressive severe mitral regurgitation, hopefully biventricular pacing will help in heart function. She is not candidate for invasive procedures.   Limited pharmacologic therapy due to hypotension.  Patient will continue low dose metoprolol and diuresis with torsemide.  Plan to increase to bid torsemide in case of weigh gain 2 to 3 lbs in 24 hrs 5 lbs in 7 days.    Acute kidney injury superimposed on chronic kidney disease (HCC) CKD stage 3b. Hypokalemia  Renal function today with serum cr at 1,53, K is 3,0 and serum bicarbonate at 28. Na 140 and Mg 2,  Plan to continue K correction with Kc Follow up  renal function in am  Continue diuresis with furosemide.    CAD (coronary artery disease) No chest pain or angina.  Continue with aspirin and rosuvastatin.  Holding clopidogrel for one week after procedure.   COPD (chronic obstructive pulmonary disease) (HCC) No clinical signs of exacerbation Oxymetry on  ambulation on room air 92%  Patient with vocal cord paralysis.   Iron deficiency anemia Cell count has been stable.  Continue with oral iron supplementation.   Mild d dimer elevation with low pre test probability for pulmonary embolism,  [place] lower extremities was negative for DVT on 11/2021  Patient refused V/Q scan  With low GFR with avoid IV contrast, chest radiograph with pulmonary edema on admission.   Mixed hyperlipidemia Continue statin therapy.          Consultants: cardiology and EP  Procedures performed:  S/p CRT-P with Left bundle and LV lead 01/02/2022 by Dr. Lovena Le  Disposition: Home Diet recommendation:  Discharge Diet Orders (From admission, onward)     Start     Ordered   01/04/22 0000  Diet - low sodium heart healthy        01/04/22 1342           Cardiac diet DISCHARGE MEDICATION: Allergies as of 01/04/2022       Reactions   Sugar-protein-starch Nausea And Vomiting   Only to sugar        Medication List     STOP taking these medications    Pfizer COVID-19 Vac Bivalent injection Generic drug: COVID-19 mRNA bivalent vaccine Therapist, music)       TAKE these medications    albuterol 108 (90 Base) MCG/ACT inhaler Commonly known as: VENTOLIN HFA Inhale 2 puffs into the lungs every 6 (six) hours as needed for wheezing or shortness of breath.   ALPRAZolam 0.25 MG tablet Commonly known as: XANAX TAKE ONE TABLET BY MOUTH ONCE TO TWICE DAILY AS NEEDED What changed:  how much to take how to take this when to take this reasons to take this additional instructions   aspirin 81 MG chewable tablet Chew 1 tablet (81 mg total) by mouth daily.   citalopram 10 MG tablet Commonly known as: CeleXA Take 1 tablet (10 mg total) by mouth daily.   clopidogrel 75 MG tablet Commonly known as: PLAVIX Take 1 tablet (75 mg total) by mouth daily. Start taking on: January 10, 2022 What changed: These instructions start on January 10, 2022. If you are unsure  what to do until then, ask your doctor or other care provider.   denosumab 60 MG/ML Sosy injection Commonly known as: PROLIA Inject 60 mg into the skin every 6 (six) months.   ferrous sulfate 325 (65 FE) MG tablet Take 1 tablet (325 mg total) by mouth daily with breakfast. Start taking on: January 05, 2022   Fluzone High-Dose Quadrivalent 0.7 ML Susy Generic drug: Influenza Vac High-Dose Quad   ipratropium 0.06 % nasal spray Commonly known as: ATROVENT Place 2 sprays into both nostrils 4 (four) times daily.   metoprolol succinate 25 MG 24 hr tablet Commonly known as: TOPROL-XL Take 0.5 tablets (12.5 mg total) by mouth daily.   midodrine 2.5 MG tablet Commonly known as: PROAMATINE Take 1 tablet (2.5 mg total) by mouth 3 (three) times daily with meals.   ondansetron 4 MG disintegrating tablet Commonly known as: ZOFRAN-ODT TAKE 1 TABLET BY MOUTH TWICE A DAY AS NEEDED FOR NAUSEA AND VOMITING appt further refills no exceptions What changed:  how much  to take how to take this when to take this reasons to take this additional instructions   oxyCODONE 5 MG immediate release tablet Commonly known as: Oxy IR/ROXICODONE Take 1 tablet (5 mg total) by mouth 2 (two) times daily as needed for moderate pain (pain score 4-6).   rosuvastatin 40 MG tablet Commonly known as: CRESTOR TAKE ONE TABLET BY MOUTH ONE TIME DAILY AT 6PM What changed: See the new instructions.   torsemide 20 MG tablet Commonly known as: DEMADEX Take 1 tablet (20 mg total) by mouth daily. Take twice day in case of weight increase 2 to 3 lbs in 24 hrs or 5 lbs in 7 days. What changed:  how much to take how to take this when to take this additional instructions        Follow-up Information     [place]. Go in 13 day(s).   Specialty: Cardiology Why: Hospital follow up Please bring a current medication list to appointment FREE valet parking , Entrance C, off  [employer] information: [address] [id number] [employer] [employer] [facility] [place]. [facility] Follow up.   Specialty: Cardiology Why: on 1115 at 240 for post pacemaker wound check Contact information: [address], [place] [address] [phone]        Kathlen Mody, Cadence H, PA-C Follow up on 01/17/2022.   Specialty: Cardiology Why: at 8:25 AM for your cardiology appointment Contact information: [place] [address] [phone]                Discharge Exam: Danley Danker Weights   01/02/22 0100 01/03/22 0445 01/04/22 0154  Weight: 56.5 kg 56.5 kg 54.2 kg   BP 124/72   Pulse 96   Temp (!) 97.5 F (36.4 C) (Oral)   Resp 18   Ht 5' (1.524 m)   Wt 54.2 kg   SpO2 92%   BMI 23.32 kg/m   Patient is feeling better, with no chest pain, edema and dyspnea have improved  Neurology awake and alert ENT with mild pallor Cardiovascular with S1 and S2 present and rhythmic with no gallops or murmurs Respiratory with no rales or wheezing  Abdomen soft Trace lower extremity edema   Condition at discharge: stable  The results of significant diagnostics from this hospitalization (including imaging, microbiology, ancillary and laboratory) are listed below for reference.   Imaging Studies: DG Chest 2 View  Result Date: 01/03/2022 CLINICAL DATA:  Pacemaker placement. EXAM: CHEST - 2 VIEW COMPARISON:  January 01, 2022. FINDINGS: Stable cardiomegaly. Interval placement of left-sided pacemaker with leads in grossly good position. No pneumothorax is noted. Bibasilar atelectasis or edema is noted with associated pleural effusions. No definite acute osseous abnormality is noted. IMPRESSION: Interval placement of left-sided pacemaker with leads in grossly good position. Stable bibasilar opacities as described  above. Electronically Signed   By: Marijo Conception M.D.   On: 01/03/2022 08:21   EP PPM/ICD IMPLANT  Result Date: 01/03/2022 CONCLUSIONS: 1. Successful implantation of a [employer] BiV pacemaker for symptomatic left bundle/ AV block and chronic systolic heart failure. A PPM was placed due to the patient's preference. 2. No early apparent complications. Cristopher Peru, MD 7:10 PM 01/02/2022    DG Chest 2 View  Result Date: 01/01/2022 CLINICAL DATA:  Pleural effusion EXAM: CHEST -  2 VIEW COMPARISON:  Chest x-ray 12/04/2021 FINDINGS: The heart is enlarged, unchanged. There are small bilateral pleural effusions and bibasilar infiltrates similar to prior. There is no evidence for pneumothorax or acute fracture. IMPRESSION: Stable cardiomegaly with small bilateral pleural effusions and bibasilar infiltrates. Electronically Signed   By: Ronney Asters M.D.   On: 01/01/2022 17:56   ECHOCARDIOGRAM COMPLETE  Result Date: 01/01/2022    ECHOCARDIOGRAM REPORT   Patient Name:   ROGINA SCHIANO Ebeling Date of Exam: 01/01/2022 Medical Rec #:  [id number]     Height:       60.0 in Accession #:    [id number]    Weight:       127.1 lb Date of Birth:  [birth year]     BSA:          1.539 m Patient Age:    81 years      BP:           123/70 mmHg Patient Gender: F             HR:           96 bpm. Exam Location:  Inpatient Procedure: 2D Echo, Cardiac Doppler, Color Doppler and Intracardiac            Opacification Agent Indications:    CHF  History:        Patient has prior history of Echocardiogram examinations, most                 recent 06/06/2021. Cardiomyopathy, CAD; Arrythmias:LBBB.  Sonographer:    Darlina Sicilian RDCS Referring Phys: [id number] OLADAPO ADEFESO IMPRESSIONS  1. Left ventricular ejection fraction, by estimation, is <20%. The left ventricle has severely decreased function. The left ventricle demonstrates global hypokinesis. The left ventricular internal cavity size was severely dilated. There is mild asymmetric left  ventricular hypertrophy. Left ventricular diastolic parameters are indeterminate. There is no LV thrombus.  2. Right ventricular systolic function is mildly reduced. The right ventricular size is normal. There is moderately elevated pulmonary artery systolic pressure. The estimated right ventricular systolic pressure is 00.3 mmHg.  3. Left atrial size was severely dilated.  4. A small pericardial effusion is present. The pericardial effusion is anterior to the right ventricle and posterior to the left ventricle.  5. 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 Severe mitral valve regurgitation. No evidence of mitral stenosis. The mean mitral valve gradient is 5.0 mmHg. Central ventricular MR with A2-P2 worst pathology and pulmonary vein flow reversal.  6. Tricuspid valve regurgitation is mild to moderate.  7. The aortic valve is tricuspid. There is mild thickening of the aortic valve. Aortic valve regurgitation is mild to moderate. No aortic stenosis is present. Comparison(s): Further decrease in LV size and function; increase in MR. FINDINGS  Left Ventricle: Left ventricular ejection fraction, by estimation, is <20%. The left ventricle has severely decreased function. The left ventricle demonstrates global hypokinesis. Definity contrast agent was given IV to delineate the left ventricular endocardial borders. The left ventricular internal cavity size was severely dilated. There is mild asymmetric left ventricular hypertrophy. Left ventricular diastolic parameters are indeterminate. Right Ventricle: The right ventricular size is normal. No increase in right ventricular wall thickness. Right ventricular systolic function is mildly reduced. There is moderately elevated pulmonary artery systolic pressure. The tricuspid regurgitant velocity is 3.19 m/s, and with an assumed right atrial pressure of 15 mmHg, the estimated right ventricular systolic pressure is 70.4 mmHg. Left Atrium: Left atrial size  was  severely dilated. Right Atrium: Right atrial size was normal in size. Pericardium: A small pericardial effusion is present. The pericardial effusion is anterior to the right ventricle and posterior to the left ventricle. Mitral Valve: MVA 5.8 cm2 by PHT. The mitral valve is rheumatic. Severe mitral valve regurgitation. No evidence of mitral valve stenosis. The mean mitral valve gradient is 5.0 mmHg with average heart rate of 94 bpm. Tricuspid Valve: The tricuspid valve is grossly normal. Tricuspid valve regurgitation is mild to moderate. No evidence of tricuspid stenosis. Aortic Valve: The aortic valve is tricuspid. There is mild thickening of the aortic valve. Aortic valve regurgitation is mild to moderate. Aortic regurgitation PHT measures 248 msec. No aortic stenosis is present. Pulmonic Valve: The pulmonic valve was normal in structure. Pulmonic valve regurgitation is mild. No evidence of pulmonic stenosis. Aorta: The aortic root, ascending aorta and aortic arch are all structurally normal, with no evidence of dilitation or obstruction and the aortic root and ascending aorta are structurally normal, with no evidence of dilitation. IAS/Shunts: No atrial level shunt detected by color flow Doppler.  LEFT VENTRICLE PLAX 2D LVIDd:         6.40 cm      Diastology LVIDs:         5.90 cm      LV e' medial:    7.88 cm/s LV PW:         0.90 cm      LV E/e' medial:  17.6 LV IVS:        1.10 cm      LV e' lateral:   12.45 cm/s LVOT diam:     1.60 cm      LV E/e' lateral: 11.2 LV SV:         21 LV SV Index:   13 LVOT Area:     2.01 cm  LV Volumes (MOD) LV vol d, MOD A2C: 375.0 ml LV vol d, MOD A4C: 277.0 ml LV vol s, MOD A2C: 290.0 ml LV vol s, MOD A4C: 277.0 ml LV SV MOD A2C:     85.0 ml LV SV MOD A4C:     277.0 ml LV SV MOD BP:      66.4 ml RIGHT VENTRICLE RV S prime:     5.33 cm/s LEFT ATRIUM              Index        RIGHT ATRIUM           Index LA diam:        5.00 cm  3.25 cm/m   RA Area:     11.00 cm LA Vol (A2C):    113.0 ml 73.40 ml/m  RA Volume:   21.50 ml  13.97 ml/m LA Vol (A4C):   115.0 ml 74.70 ml/m LA Biplane Vol: 117.0 ml 76.00 ml/m  AORTIC VALVE             PULMONIC VALVE LVOT Vmax:   54.90 cm/s  PR End Diast Vel: 8.41 msec LVOT Vmean:  40.400 cm/s RVOT Peak grad:   1 mmHg LVOT VTI:    0.103 m AI PHT:      248 msec  AORTA Ao Root diam: 2.70 cm Ao Asc diam:  3.00 cm MITRAL VALVE                  TRICUSPID VALVE MV Area (PHT): 5.64 cm       TR Peak grad:  40.7 mmHg MV Mean grad:  5.0 mmHg       TR Vmax:        319.00 cm/s MV Decel Time: 135 msec MR Peak grad:    104.0 mmHg   SHUNTS MR Mean grad:    70.0 mmHg    Systemic VTI:  0.10 m MR Vmax:         510.00 cm/s  Systemic Diam: 1.60 cm MR Vmean:        403.0 cm/s   Pulmonic VTI:  0.072 m MR PISA:         2.26 cm MR PISA Eff ROA: 18 mm MR PISA Radius:  0.60 cm MV E velocity: 139.00 cm/s Rudean Haskell MD Electronically signed by Rudean Haskell MD Signature Date/Time: 01/01/2022/11:23:40 AM    Final    DG Chest 2 View  Result Date: 12/31/2021 CLINICAL DATA:  Shortness of breath. Anemia. Leg swelling for several days. History of CHF. EXAM: CHEST - 2 VIEW COMPARISON:  Chest two views 11/13/2021, AP chest 06/08/2021, AP chest 06/05/2021. FINDINGS: Cardiac silhouette is again markedly enlarged. Mediastinal contours are within normal limits with moderate calcification within the aortic arch. Mild-to-moderate bilateral pleural effusions are increased from the most recent 11/13/2021 comparison chest radiographs. Mild bilateral interstitial thickening is slightly increased from 06/05/2021 and may represent mild interstitial pulmonary edema. No pneumothorax is seen. Old healed posterior right sixth rib fracture. Moderate multilevel degenerative disc changes of the visualized thoracic and lumbar spine. Moderate height loss of multiple midthoracic vertebral bodies, grossly similar to 06/05/2021 CT. Other plasty cement is again seen within the T12 vertebral  body. IMPRESSION: 1. Mild-to-moderate bilateral pleural effusions, increased from 11/13/2021. 2. Mild bilateral interstitial thickening is slightly increased from 06/05/2021 and may represent mild interstitial pulmonary edema. Electronically Signed   By: Yvonne Kendall M.D.   On: 12/31/2021 11:58    Microbiology: Results for orders placed or performed during the hospital encounter of 12/31/21  SARS Coronavirus 2 by RT PCR (hospital order, performed in [facility] hospital lab) *cepheid single result test* Anterior Nasal Swab     Status: None   Collection Time: 12/31/21  1:42 PM   Specimen: Anterior Nasal Swab  Result Value Ref Range Status   SARS Coronavirus 2 by RT PCR NEGATIVE NEGATIVE Final    Comment: (NOTE) SARS-CoV-2 target nucleic acids are NOT DETECTED.  The SARS-CoV-2 RNA is generally detectable in upper and lower respiratory specimens during the acute phase of infection. The lowest concentration of SARS-CoV-2 viral copies this assay can detect is 250 copies / mL. A negative result does not preclude SARS-CoV-2 infection and should not be used as the sole basis for treatment or other patient management decisions.  A negative result may occur with improper specimen collection / handling, submission of specimen other than nasopharyngeal swab, presence of viral mutation(s) within the areas targeted by this assay, and inadequate number of viral copies (<250 copies / mL). A negative result must be combined with clinical observations, patient history, and epidemiological information.  Fact Sheet for Patients:   https://www.patel.info/  Fact Sheet for Healthcare Providers: https://hall.com/  This test is not yet approved or  cleared by the [place] FDA and has been authorized for detection and/or diagnosis of SARS-CoV-2 by FDA under an Emergency Use Authorization (EUA).  This EUA will remain in effect (meaning this test can be used) for  the duration of the COVID-19 declaration under Section 564(b)(1) of the Act, 21 U.S.C. section 360bbb-3(b)(1), unless the  authorization is terminated or revoked sooner.  Performed at [facility], [place] [address], [place], [place] [address]   Surgical PCR screen     Status: None   Collection Time: 01/02/22 11:12 AM   Specimen: Nasal Mucosa; Nasal Swab  Result Value Ref Range Status   MRSA, PCR NEGATIVE NEGATIVE Final   Staphylococcus aureus NEGATIVE NEGATIVE Final    Comment: (NOTE) The Xpert SA Assay (FDA approved for NASAL specimens in patients 74 years of age and older), is one component of a comprehensive surveillance program. It is not intended to diagnose infection nor to guide or monitor treatment. Performed at [facility], [place] [address] [place], [place] [address]     Labs: CBC: Recent Labs  Lab 12/31/21 1119 01/01/22 0049  WBC 8.0 8.1  NEUTROABS 5.7  --   HGB 14.9 14.3  HCT 46.3* 44.2  MCV 102.2* 100.2*  PLT 182 734   Basic Metabolic Panel: Recent Labs  Lab 12/31/21 1119 01/01/22 0049 01/02/22 0110 01/03/22 0052 01/04/22 0055  NA 142 144 142 140 142  K 4.1 3.5 3.3* 3.0* 4.2  CL 100 100 104 100 102  CO2 25 27 26 28 29   GLUCOSE 108* 111* 99 127* 151*  BUN 35* 31* 24* 18 14  CREATININE 1.93* 1.85* 1.67* 1.53* 1.38*  CALCIUM 9.6 9.7 9.2 8.7* 9.1  MG  --   --   --  2.1  --    Liver Function Tests: Recent Labs  Lab 12/31/21 1119 01/01/22 0049  AST 37 29  ALT 26 29  ALKPHOS 62 62  BILITOT 0.7 0.9  PROT 6.7 6.5  ALBUMIN 3.9 3.8   CBG: No results for input(s): "GLUCAP" in the last 168 hours.  Discharge time spent: greater than 30 minutes.  Signed: Tawni Millers, MD [facility] 01/04/2022

## 2022-01-04 NOTE — Evaluation (Signed)
Physical Therapy Evaluation Patient Details Name: [name] MRN: [id number] DOB: 1940-05-18 Today's Date: 01/04/2022  History of Present Illness  Patient is a 81 y/o female who presents on 10/30 with SOB, fatigue, and LE edema. CXR- mild to moderate bilateral pleural effusions and mild interstitial pulmonary edema. BNP >4500. s/p BiV pacemaker placed 11/1. PMH includes CAD, anxiety, OA, ischemic cardiomyopathy, LBBB, mitral regurgitation, CHF, Hep C, chronic back pain, back surgery, hip surgery, paralysis of vocal cords, COPD.  Clinical Impression  Patient presents with Left shoulder pain s/p pacer, generalized debility, impaired balance and impaired mobility s/p above. Pt lives at home with her spouse and requires assist for all ADLs and IADLs at baseline. Pt uses rollator for mobility and reports no falls since April. Today, pt requires Min A to steady in standing and Min guard assist for ambulation in room. Pt is a minimal household ambulator at baseline. Reviewed pacemaker handout and precautions and provided gait belt for spouse.  Pt declines wanting [facility] services as her spouse has been caring for her at baseline and she is more comfortable with him. Eager to d/c today. Will follow acutely to maximize independence and mobility prior to return home.     Recommendations for follow up therapy are one component of a multi-disciplinary discharge planning process, led by the attending physician.  Recommendations may be updated based on patient status, additional functional criteria and insurance authorization.  Follow Up Recommendations No PT follow up (declining HH services)      Assistance Recommended at Discharge Frequent or constant Supervision/Assistance  Patient can return home with the following  A little help with walking and/or transfers;A little help with bathing/dressing/bathroom;Assistance with cooking/housework;Assist for transportation    Equipment Recommendations None recommended  by PT  Recommendations for Other Services       Functional Status Assessment Patient has had a recent decline in their functional status and demonstrates the ability to make significant improvements in function in a reasonable and predictable amount of time.     Precautions / Restrictions Precautions Precautions: ICD/Pacemaker Restrictions Weight Bearing Restrictions: No      Mobility  Bed Mobility               General bed mobility comments: Up in chair upon PT arrival.    Transfers Overall transfer level: Needs assistance Equipment used: Rolling walker (2 wheels) Transfers: Sit to/from Stand Sit to Stand: Min assist           General transfer comment: Min A to steady in standing with cues to refrain from pushing up with LUE.    Ambulation/Gait Ambulation/Gait assistance: Min guard Gait Distance (Feet): 40 Feet Assistive device: Rolling walker (2 wheels) Gait Pattern/deviations: Step-through pattern, Decreased stride length, Trunk flexed Gait velocity: decreased     General Gait Details: Slow, mostly steady gait with RW, used to using rollator at home. VSS on RA.  Stairs            Wheelchair Mobility    Modified Rankin (Stroke Patients Only)       Balance Overall balance assessment: Needs assistance, History of Falls Sitting-balance support: Feet supported, No upper extremity supported Sitting balance-Leahy Scale: Fair     Standing balance support: During functional activity, Bilateral upper extremity supported Standing balance-Leahy Scale: Poor Standing balance comment: Requires UE support in standing.  Pertinent Vitals/Pain Pain Assessment Pain Assessment: Faces Faces Pain Scale: Hurts even more Pain Location: left shoulder at pacer site Pain Descriptors / Indicators: Sore, Operative site guarding Pain Intervention(s): Monitored during session, Repositioned, Limited activity within patient's  tolerance    Home Living Family/patient expects to be discharged to:: Private residence Living Arrangements: Spouse/significant other Available Help at Discharge: Family;Available 24 hours/day Type of Home: House Home Access: Level entry       Home Layout: One level Home Equipment: Rollator (4 wheels);Cane - single point;Shower seat - built in;Grab bars - tub/shower;Grab bars - toilet;Rolling Walker (2 wheels)      Prior Function Prior Level of Function : Needs assist             Mobility Comments: Uses rollator for all mobility. ADLs Comments: Assist needed with ADLs and IADLs. No falls reported since April when she broke her hip.     Hand Dominance        Extremity/Trunk Assessment   Upper Extremity Assessment Upper Extremity Assessment: Defer to OT evaluation;RUE deficits/detail RUE Deficits / Details: Limited movement due to recent pacer RUE: Unable to fully assess due to pain RUE Sensation: WNL    Lower Extremity Assessment Lower Extremity Assessment: Overall WFL for tasks assessed       Communication   Communication:  (paralysis in vocal cords so low raspy, whispers is baseline)  Cognition Arousal/Alertness: Awake/alert Behavior During Therapy: WFL for tasks assessed/performed Overall Cognitive Status: Within Functional Limits for tasks assessed                                          General Comments General comments (skin integrity, edema, etc.): Spouse present during session. Tenderness and pain through left shoulder.    Exercises     Assessment/Plan    PT Assessment Patient needs continued PT services  PT Problem List Decreased mobility;Pain;Decreased balance;Decreased skin integrity;Decreased range of motion;Decreased knowledge of precautions       PT Treatment Interventions Therapeutic activities;Gait training;Therapeutic exercise;Patient/family education;Balance training;Functional mobility training    PT Goals (Current  goals can be found in the Care Plan section)  Acute Rehab PT Goals Patient Stated Goal: to go home PT Goal Formulation: With patient/family Time For Goal Achievement: 01/18/22 Potential to Achieve Goals: Good    Frequency Min 3X/week     Co-evaluation               AM-PAC PT "6 Clicks" Mobility  Outcome Measure Help needed turning from your back to your side while in a flat bed without using bedrails?: A Little Help needed moving from lying on your back to sitting on the side of a flat bed without using bedrails?: A Little Help needed moving to and from a bed to a chair (including a wheelchair)?: A Little Help needed standing up from a chair using your arms (e.g., wheelchair or bedside chair)?: A Little Help needed to walk in hospital room?: A Little Help needed climbing 3-5 steps with a railing? : A Little 6 Click Score: 18    End of Session Equipment Utilized During Treatment: Gait belt Activity Tolerance: Patient tolerated treatment well Patient left: in chair;with call bell/phone within reach;with family/visitor present Nurse Communication: Mobility status PT Visit Diagnosis: Pain;Difficulty in walking, not elsewhere classified (R26.2) Pain - Right/Left: Left Pain - part of body: Shoulder    Time: 7654-6503 PT Time  Calculation (min) (ACUTE ONLY): 22 min   Charges:   PT Evaluation $PT Eval Moderate Complexity: 1 Mod          Marisa Severin, PT, DPT Acute Rehabilitation Services Secure chat preferred Office [place] 01/04/2022, 12:28 PM

## 2022-01-04 NOTE — Progress Notes (Signed)
Patient able to stand and transferred with 1 man assist from bed to chair. O2 saturation recorded 94 % on Room Air. Tolerating exertion better.

## 2022-01-04 NOTE — TOC Transition Note (Signed)
Transition of Care [facility]) - CM/SW Discharge Note   Patient Details  Name: [name] MRN: [id number] Date of Birth: November 17, 1940  [facility] of Care [facility]) CM/SW Contact:  Zenon Mayo, RN Phone Number: 01/04/2022, 2:26 PM   Clinical Narrative:    Patient is for dc home today, she does not need oxygen.  She has transport home today.          Patient Goals and CMS Choice        Discharge Placement                       Discharge Plan and Services                                     Social Determinants of Health (SDOH) Interventions Food Insecurity Interventions: Intervention Not Indicated Housing Interventions: Intervention Not Indicated Transportation Interventions: Intervention Not Indicated Alcohol Usage Interventions: Intervention Not Indicated (Score <7) Financial Strain Interventions: Intervention Not Indicated   Readmission Risk Interventions     No data to display

## 2022-01-04 NOTE — Progress Notes (Signed)
Patient refused lasix and demedex

## 2022-01-04 NOTE — Progress Notes (Signed)
OT Cancellation Note  Patient Details Name: [name] MRN: [id number] DOB: May 24, 1940   Cancelled Treatment:    Reason Eval/Treat Not Completed: OT screened, no needs identified, will sign off. Discussed with PT. Husband assists with ADL at baseline. PT reviewed precautions s/p pacemaker placement. Will sign off.   Katrese Shell,HILLARY 01/04/2022, 12:15 PM Maurie Boettcher, OT/L   Acute OT Clinical Specialist Acute Rehabilitation Services Pager [phone] Office [phone]

## 2022-01-04 NOTE — Progress Notes (Signed)
Patient refuses lung scan

## 2022-01-04 NOTE — Plan of Care (Signed)
Problem: Education: Goal: Knowledge of General Education information will improve Description: Including pain rating scale, medication(s)/side effects and non-pharmacologic comfort measures 01/04/2022 1332 by Caroll Rancher, RN Outcome: Adequate for Discharge 01/04/2022 1332 by Caroll Rancher, RN Outcome: Adequate for Discharge   Problem: Health Behavior/Discharge Planning: Goal: Ability to manage health-related needs will improve 01/04/2022 1332 by Caroll Rancher, RN Outcome: Adequate for Discharge 01/04/2022 1332 by Caroll Rancher, RN Outcome: Adequate for Discharge   Problem: Clinical Measurements: Goal: Ability to maintain clinical measurements within normal limits will improve 01/04/2022 1332 by Caroll Rancher, RN Outcome: Adequate for Discharge 01/04/2022 1332 by Caroll Rancher, RN Outcome: Adequate for Discharge Goal: Will remain free from infection 01/04/2022 1332 by Caroll Rancher, RN Outcome: Adequate for Discharge 01/04/2022 1332 by Caroll Rancher, RN Outcome: Adequate for Discharge Goal: Diagnostic test results will improve 01/04/2022 1332 by Caroll Rancher, RN Outcome: Adequate for Discharge 01/04/2022 1332 by Caroll Rancher, RN Outcome: Adequate for Discharge Goal: Respiratory complications will improve 01/04/2022 1332 by Caroll Rancher, RN Outcome: Adequate for Discharge 01/04/2022 1332 by Caroll Rancher, RN Outcome: Adequate for Discharge Goal: Cardiovascular complication will be avoided 01/04/2022 1332 by Caroll Rancher, RN Outcome: Adequate for Discharge 01/04/2022 1332 by Caroll Rancher, RN Outcome: Adequate for Discharge   Problem: Activity: Goal: Risk for activity intolerance will decrease 01/04/2022 1332 by Caroll Rancher, RN Outcome: Adequate for Discharge 01/04/2022 1332 by Caroll Rancher, RN Outcome: Adequate for Discharge    Problem: Nutrition: Goal: Adequate nutrition will be maintained 01/04/2022 1332 by Caroll Rancher, RN Outcome: Adequate for Discharge 01/04/2022 1332 by Caroll Rancher, RN Outcome: Adequate for Discharge   Problem: Coping: Goal: Level of anxiety will decrease 01/04/2022 1332 by Caroll Rancher, RN Outcome: Adequate for Discharge 01/04/2022 1332 by Caroll Rancher, RN Outcome: Adequate for Discharge   Problem: Nutrition: Goal: Adequate nutrition will be maintained 01/04/2022 1332 by Caroll Rancher, RN Outcome: Adequate for Discharge 01/04/2022 1332 by Caroll Rancher, RN Outcome: Adequate for Discharge   Problem: Elimination: Goal: Will not experience complications related to bowel motility 01/04/2022 1332 by Caroll Rancher, RN Outcome: Adequate for Discharge 01/04/2022 1332 by Caroll Rancher, RN Outcome: Adequate for Discharge Goal: Will not experience complications related to urinary retention 01/04/2022 1332 by Caroll Rancher, RN Outcome: Adequate for Discharge 01/04/2022 1332 by Caroll Rancher, RN Outcome: Adequate for Discharge   Problem: Pain Managment: Goal: General experience of comfort will improve 01/04/2022 1332 by Caroll Rancher, RN Outcome: Adequate for Discharge 01/04/2022 1332 by Caroll Rancher, RN Outcome: Adequate for Discharge   Problem: Safety: Goal: Ability to remain free from injury will improve 01/04/2022 1332 by Caroll Rancher, RN Outcome: Adequate for Discharge 01/04/2022 1332 by Caroll Rancher, RN Outcome: Adequate for Discharge   Problem: Skin Integrity: Goal: Risk for impaired skin integrity will decrease 01/04/2022 1332 by Caroll Rancher, RN Outcome: Adequate for Discharge 01/04/2022 1332 by Caroll Rancher, RN Outcome: Adequate for Discharge   Problem: Education: Goal: Knowledge of cardiac device and self-care will  improve 01/04/2022 1332 by Caroll Rancher, RN Outcome: Adequate for Discharge 01/04/2022 1332 by Caroll Rancher, RN Outcome: Adequate for Discharge Goal: Ability to safely manage health related needs after discharge will improve 01/04/2022 1332 by Caroll Rancher, RN Outcome: Adequate for Discharge 01/04/2022 1332 by Caroll Rancher, RN Outcome: Adequate for Discharge Goal: Individualized  Educational Video(s) 01/04/2022 1332 by Caroll Rancher, RN Outcome: Adequate for Discharge 01/04/2022 1332 by Caroll Rancher, RN Outcome: Adequate for Discharge   Problem: Cardiac: Goal: Ability to achieve and maintain adequate cardiopulmonary perfusion will improve 01/04/2022 1332 by Caroll Rancher, RN Outcome: Adequate for Discharge 01/04/2022 1332 by Caroll Rancher, RN Outcome: Adequate for Discharge

## 2022-01-04 NOTE — Progress Notes (Signed)
Rounding Note    Patient Name: [name] Date of Encounter: 01/04/2022  [place] Cardiologist: Ida Rogue, MD   Subjective   Patient anxious to go home   No CP  Breathing is OK   Inpatient Medications    Scheduled Meds:  aspirin  81 mg Oral Daily   citalopram  10 mg Oral Daily   [START ON 01/10/2022] clopidogrel  75 mg Oral Daily   ferrous sulfate  325 mg Oral Q breakfast   furosemide  40 mg Intravenous BID   ipratropium  2 spray Each Nare QID   metoprolol succinate  12.5 mg Oral Daily   mupirocin ointment   Nasal BID   rosuvastatin  40 mg Oral Daily   senna-docusate  1 tablet Oral BID   Continuous Infusions:  sodium chloride Stopped (01/03/22 0602)   PRN Meds: sodium chloride, acetaminophen, albuterol, ALPRAZolam, oxyCODONE   Vital Signs    Vitals:   01/04/22 0154 01/04/22 0600 01/04/22 0649 01/04/22 0757  BP:  133/67    Pulse:  98    Resp:  (!) 23  20  Temp:  97.6 F (36.4 C)  97.6 F (36.4 C)  TempSrc:    Oral  SpO2:  91% 94% 90%  Weight: 54.2 kg     Height:        Intake/Output Summary (Last 24 hours) at 01/04/2022 0822 Last data filed at 01/04/2022 0647 Gross per 24 hour  Intake 270 ml  Output 2700 ml  Net -2430 ml   Net neg 3.5 L      01/04/2022    1:54 AM 01/03/2022    4:45 AM 01/02/2022    1:00 AM  Last 3 Weights  Weight (lbs) 119 lb 6.4 oz 124 lb 9 oz 124 lb 8 oz  Weight (kg) 54.159 kg 56.5 kg 56.473 kg      Telemetry    SR  - Personally Reviewed  ECG    No new ECG tracing today. - Personally Reviewed  Physical Exam   Physical Exam per MD:  GEN:  No acute distress.   Neck  JVP is normal Cardiac: RRR. Soft II/VI systolic murmur  Respiratory: No increased work of breathing. Clear to ausculation bilaterally. GI: Soft, non-distended, and non-tender. MS: Triv lower extremity edema. No deformity. Skin: Warm and dry. Neuro:  No focal deficits. Psych: Normal affect. Responds appropriately.   Labs    High  Sensitivity Troponin:   Recent Labs  Lab 12/31/21 1119 12/31/21 1319  TROPONINIHS 58* 42*     Chemistry Recent Labs  Lab 12/31/21 1119 01/01/22 0049 01/02/22 0110 01/03/22 0052 01/04/22 0055  NA 142 144 142 140 142  K 4.1 3.5 3.3* 3.0* 4.2  CL 100 100 104 100 102  CO2 25 27 26 28 29   GLUCOSE 108* 111* 99 127* 151*  BUN 35* 31* 24* 18 14  CREATININE 1.93* 1.85* 1.67* 1.53* 1.38*  CALCIUM 9.6 9.7 9.2 8.7* 9.1  MG  --   --   --  2.1  --   PROT 6.7 6.5  --   --   --   ALBUMIN 3.9 3.8  --   --   --   AST 37 29  --   --   --   ALT 26 29  --   --   --   ALKPHOS 62 62  --   --   --   BILITOT 0.7 0.9  --   --   --  GFRNONAA 26* 27* 31* 34* 39*  ANIONGAP 17* 17* 12 12 11     Lipids  Recent Labs  Lab 01/02/22 0110  CHOL 101  TRIG 93  HDL 40*  LDLCALC 42  CHOLHDL 2.5    Hematology Recent Labs  Lab 12/31/21 1119 01/01/22 0049  WBC 8.0 8.1  RBC 4.53 4.41  HGB 14.9 14.3  HCT 46.3* 44.2  MCV 102.2* 100.2*  MCH 32.9 32.4  MCHC 32.2 32.4  RDW 16.8* 16.7*  PLT 182 174   Thyroid No results for input(s): "TSH", "FREET4" in the last 168 hours.  BNP Recent Labs  Lab 12/31/21 1120  BNP >4,500.0*    DDimer  Recent Labs  Lab 12/31/21 1342  DDIMER 1.84*     Radiology    DG Chest 2 View  Result Date: 01/03/2022 CLINICAL DATA:  Pacemaker placement. EXAM: CHEST - 2 VIEW COMPARISON:  January 01, 2022. FINDINGS: Stable cardiomegaly. Interval placement of left-sided pacemaker with leads in grossly good position. No pneumothorax is noted. Bibasilar atelectasis or edema is noted with associated pleural effusions. No definite acute osseous abnormality is noted. IMPRESSION: Interval placement of left-sided pacemaker with leads in grossly good position. Stable bibasilar opacities as described above. Electronically Signed   By: Marijo Conception M.D.   On: 01/03/2022 08:21   EP PPM/ICD IMPLANT  Result Date: 01/03/2022 CONCLUSIONS: 1. Successful implantation of a [employer] BiV  pacemaker for symptomatic left bundle/ AV block and chronic systolic heart failure. A PPM was placed due to the patient's preference. 2. No early apparent complications. Cristopher Peru, MD 7:10 PM 01/02/2022     Cardiac Studies   Echocardiogram 06/06/2021: Impressions: 1. Left ventricular ejection fraction, by estimation, is 25 to 30%. The  left ventricle has severely decreased function. The left ventricle  demonstrates global hypokinesis. The left ventricular internal cavity size  was mildly dilated. There is moderate  asymmetric left ventricular hypertrophy of the septal segment. Left  ventricular diastolic parameters are consistent with Grade II diastolic  dysfunction (pseudonormalization).   2. Right ventricular systolic function is low normal. The right  ventricular size is mildly enlarged.   3. Left atrial size was moderately dilated.   4. The mitral valve is normal in structure. Moderate mitral valve  regurgitation.   5. The aortic valve is tricuspid. Aortic valve regurgitation is trivial.  _______________   Right/ Left Cardiac Catheterization 06/13/2021: Conclusions: Severe single-vessel coronary artery disease with sequential 80-90% proximal/mid LAD stenoses flanking previously placed, which are highly significant by iFR and involve D1 and D2. Mild-moderate, nonobstructive CAD involving ramus intermedius and RCA. Widely patent mid LAD and mid RCA stents. Mildly elevated left and right heart filling pressures. Mild-moderate pulmonary hypertension. Mildly-moderately reduced Fick cardiac output/index. Successful PCI to proximal LAD using Onyx Frontier 3.0 x 12 mm drug-eluting stent (jailing D1) with 0% residual stenosis and TIMI-3 flow. Successful PCI to mid LAD using Onyx Frontier 2.5 x 15 mm drug-eluting stent (jailing D2) with angioplasty of ostium of D2.  There is 0% residual stenosis in the LAD and 80% residual stenosis at the ostium of D2 with TIMI-3 flow. Challenging  intervention via right radial approach due to tortuosity of the right subclavian artery and radial artery vasospasm.  Consider alternative access for future catheterizations.   Recommendations: Dual antiplatelet therapy with aspirin and clopidogrel for at least 12 months. Aggressive secondary prevention of coronary artery disease. Continued gentle diuresis. Escalate goal-directed medical therapy for acute on chronic HFrEF. Diagnostic Dominance: Right  Intervention    _______________  Echocardiogram 01/01/2022: Impressions: 1. Left ventricular ejection fraction, by estimation, is <20%. The left  ventricle has severely decreased function. The left ventricle demonstrates  global hypokinesis. The left ventricular internal cavity size was severely  dilated. There is mild  asymmetric left ventricular hypertrophy. Left ventricular diastolic  parameters are indeterminate. There is no LV thrombus.   2. Right ventricular systolic function is mildly reduced. The right  ventricular size is normal. There is moderately elevated pulmonary artery  systolic pressure. The estimated right ventricular systolic pressure is  71.6 mmHg.   3. Left atrial size was severely dilated.   4. A small pericardial effusion is present. The pericardial effusion is  anterior to the right ventricle and posterior to the left ventricle.   5. 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 Severe  mitral valve regurgitation. No evidence of mitral stenosis. The mean  mitral valve gradient is 5.0 mmHg. Central ventricular MR with A2-P2 worst  pathology and pulmonary vein flow  reversal.   6. Tricuspid valve regurgitation is mild to moderate.   7. The aortic valve is tricuspid. There is mild thickening of the aortic  valve. Aortic valve regurgitation is mild to moderate. No aortic stenosis  is present.   Comparison(s): Further decrease in LV size and function; increase in MR.   Patient Profile     81  y.o. female with a history of CAD s/p prior stenting to mid RCA and proximal LAD in 2016 and more recently successful PC with DES to proximal LAD and DES to mid LAD in 06/2021 (overlapping prior stent on both sides), ischemic cardiomyopathy/ chronic combined CHF with EF of 25-30% on Echo in 06/2021, moderate to severe MR, LBBB, hypotension on Midodrine, hyperlipidemia, COPD,  Hepatitis C, chronic anemia, chronic back pain, and vocal cord paralysis, who was admitted on 12/31/2021 for acute on chronic CHF with acute hypoxic respiratory failure after presenting with worsening shortness of breath.   Assessment & Plan     Acute on Chronic HFrEF  BNP >4,500. Chest x-ray showed mild to moderate pleural effusion increased from 11/2021 and mild bilateral interstitial thickening slightly increased from 06/2021. Repeat Echo this admission showed LVEF of <20% with global hypokinesis and mild asymmetric LVH but no LV thrombus, normal RV with mildly reduced systolic function, severe left atrial enlargement, severe MR, mild to moderate TR, and mild to moderate AI. EF was 25-30% on last Echo in 06/2021. Net negative 1.5 L so far.  She underwent BiV pacer placement yesterday  Hopefullly this will help with forward output EP service is seeing patient  She has been diuresed   Volume may be up slightly   Follow now that has BiV     .  - GDMT limited due to borderline BP requiring Midodrine at home. Continue Toprol-XL 12.5mg  daily. No ACEi/ARB/ARNI or MRA due to BP and renal function.  With new device I would not add Jardiance for now (don't want UTI now)  Will make sure she has follow up as ouptpt in clinic for labs, device check    CAD S/p PCI to RCA and LAD in 2016 and more recently successful PCI with DES x2 to LAD. - No angina. - On DAPT with Aspirin and Plavix. Will hold Plavix in anticipation for possible PPM/ICD. - Continue beta-blocker and statin.    MR  Echo this admission showed progression to severe MR.    Will follow now that hs BiV  Hypotension Previously had problems with hypertension but looks like more recently has had issues with hypotension and is on low dose Midodrine at home.  - BP stable today.  120s to 130s/ Off of midodrine now    Hyperlipidemia Lipid panel this admission: Total Cholesterol 101, Triglycerides 93, HDL 40, LDL 42. LDL goal <70. - Continue Crestor 40mg  daily.   Acute on CKD Stage III Creatinine 1.38 today    -Resume home dose diuretic  Close to d.c   For questions or updates, please contact [place] Please consult [email or web address] for contact info under        Signed, Dorris Carnes, MD  01/04/2022, 8:22 AM

## 2022-01-04 NOTE — Progress Notes (Signed)
Dr Harrington Challenger had signed off this patient, follow up arranged on 01/17/22 with [facility] office, will need BMP at follow up visit.

## 2022-01-04 NOTE — Care Management Important Message (Signed)
Important Message  Patient Details  Name: [name] MRN: [id number] Date of Birth: [birth year]   Medicare Important Message Given:  Yes     Shelda Altes 01/04/2022, 9:22 AM

## 2022-01-04 NOTE — Plan of Care (Signed)
Discharge instructions discussed with patient.  Patient instructed on home medications, restrictions, and follow up appointments. Belongings gathered and sent with patient.   Patient discharged via wheelchair by this writer.   

## 2022-01-04 NOTE — Progress Notes (Signed)
SATURATION QUALIFICATIONS: (This note is used to comply with regulatory documentation for home oxygen)  Patient Saturations on Room Air at Rest = 90%  Patient Saturations on Room Air while Ambulating = 92%  Patient Saturations on 0 Liters of oxygen while Ambulating = 92%  Please briefly explain why patient needs home oxygen:

## 2022-01-04 NOTE — Progress Notes (Signed)
   Heart Failure Stewardship Pharmacist Progress Note   PCP: Carollee Leitz, MD PCP-Cardiologist: Ida Rogue, MD    HPI:  81 yo F with PMH of CHF, CAD, COPD, LBBB, paralysis of vocal cords, HTN, and HLD.  Last admitted in 06/2021 after a fall from hip fracture. Postop course complicated by volume overload. ECHO at that time with EF 25-30%. R/LHC showed severe proximal and mid LAD stenosis s/p PCI with 2 DES, patent RCA stents, and mildly elevated filling pressures with mildly reduced cardiac output (RA 7, PA 30, wedge 22, CO 3.3, CI 2.0). GDMT limited by hypotension requiring midodrine at discharge.  Presented to the ED on 10/30 with shortness of breath, fatigue, and LE edema. CXR with mild to moderate bilateral pleural effusions and mild interstitial pulmonary edema. BNP >4500. ECHO 10/31 showed LVEF <20%, global hypokinesis, mild LVH, RV mildly reduced. Prior ECHO 06/2021 with LVEF 25-30%. EP consulted and placed BiV CRT-P on 11/1.  Current HF Medications: Diuretic: furosemide 40 mg IV BID Beta Blocker: metoprolol XL 12.5 mg daily *off midodrine 2.5 mg TID  Prior to admission HF Medications: Diuretic: torsemide 20 mg daily (except Sundays) Beta blocker: metoprolol XL 12.5 mg daily *also on midodrine 2.5 mg TID  Pertinent Lab Values: Serum creatinine 1.38, BUN 14, Potassium 4.2, Sodium 142, Magnesium 2.1, BNP >4500  Vital Signs: Weight: 119 lbs (admission weight: estimated 125 lbs) Blood pressure: 130/70s  Heart rate: 90s  I/O: -2.4L yesterday; net -3.7L  Medication Assistance / Insurance Benefits Check: Does the patient have prescription insurance?  Yes Type of insurance plan: [employer]  Outpatient Pharmacy:  Prior to admission outpatient pharmacy: [facility] Is the patient willing to use [facility] at discharge? Yes Is the patient willing to transition their outpatient pharmacy to utilize a [facility] outpatient pharmacy?   Pending   Assessment: 1. Acute on  chronic systolic CHF (LVEF <69%), due to ICM. NYHA class III symptoms. - Continue furosemide 40 mg IV BID. Strict I/Os and daily weights. Goal K>4 and Mag>2. - Continue metoprolol XL 12.5 mg daily - Midodrine stopped yesterday, monitor BP to see if she can tolerate ACE/ARB/ARNI or MRA - Consider adding SGLT2i prior to discharge   Plan: 1) Medication changes recommended at this time: - Monitor BP off midodrine and possibly add spironolactone 12.5 mg daily if stable  2) Patient assistance: - Farxiga copay $47 - Jardiance copay $47  3)  Education  - Patient has been educated on current HF medications and potential additions to HF medication regimen - Patient verbalizes understanding that over the next few months, these medication doses may change and more medications may be added to optimize HF regimen - Patient has been educated on basic disease state pathophysiology and goals of therapy   Kerby Nora, PharmD, BCPS Heart Failure Stewardship Pharmacist Phone [phone]

## 2022-01-04 NOTE — Plan of Care (Signed)
  Problem: Education: Goal: Knowledge of General Education information will improve Description: Including pain rating scale, medication(s)/side effects and non-pharmacologic comfort measures Outcome: Adequate for Discharge   Problem: Health Behavior/Discharge Planning: Goal: Ability to manage health-related needs will improve Outcome: Adequate for Discharge   Problem: Clinical Measurements: Goal: Ability to maintain clinical measurements within normal limits will improve Outcome: Adequate for Discharge Goal: Will remain free from infection Outcome: Adequate for Discharge Goal: Diagnostic test results will improve Outcome: Adequate for Discharge Goal: Respiratory complications will improve Outcome: Adequate for Discharge Goal: Cardiovascular complication will be avoided Outcome: Adequate for Discharge   Problem: Activity: Goal: Risk for activity intolerance will decrease Outcome: Adequate for Discharge   Problem: Nutrition: Goal: Adequate nutrition will be maintained Outcome: Adequate for Discharge   Problem: Coping: Goal: Level of anxiety will decrease Outcome: Adequate for Discharge   Problem: Elimination: Goal: Will not experience complications related to bowel motility Outcome: Adequate for Discharge Goal: Will not experience complications related to urinary retention Outcome: Adequate for Discharge   Problem: Pain Managment: Goal: General experience of comfort will improve Outcome: Adequate for Discharge   Problem: Safety: Goal: Ability to remain free from injury will improve Outcome: Adequate for Discharge   Problem: Skin Integrity: Goal: Risk for impaired skin integrity will decrease Outcome: Adequate for Discharge   Problem: Education: Goal: Knowledge of cardiac device and self-care will improve Outcome: Adequate for Discharge Goal: Ability to safely manage health related needs after discharge will improve Outcome: Adequate for Discharge Goal:  Individualized Educational Video(s) Outcome: Adequate for Discharge   Problem: Education: Goal: Knowledge of cardiac device and self-care will improve Outcome: Adequate for Discharge Goal: Ability to safely manage health related needs after discharge will improve Outcome: Adequate for Discharge Goal: Individualized Educational Video(s) Outcome: Adequate for Discharge   Problem: Cardiac: Goal: Ability to achieve and maintain adequate cardiopulmonary perfusion will improve Outcome: Adequate for Discharge

## 2022-01-07 ENCOUNTER — Other Ambulatory Visit: Payer: Self-pay

## 2022-01-07 ENCOUNTER — Ambulatory Visit: Payer: Medicare HMO

## 2022-01-07 ENCOUNTER — Other Ambulatory Visit (HOSPITAL_COMMUNITY): Payer: Self-pay

## 2022-01-07 ENCOUNTER — Telehealth: Payer: Self-pay

## 2022-01-07 MED ORDER — METOPROLOL SUCCINATE ER 25 MG PO TB24: 12.5000 mg | ORAL_TABLET | Freq: Every day | ORAL | 0 refills | Status: DC

## 2022-01-07 NOTE — Telephone Encounter (Signed)
Transition Care Management Follow-up Telephone Call Date of discharge and from where: [place] 01-04-22 Dx: acute on chronic systolic CHF How have you been since you were released from the hospital? Doing much better  Any questions or concerns? No  Items Reviewed: Did the pt receive and understand the discharge instructions provided? Yes  Medications obtained and verified? Yes  Other? No  Any new allergies since your discharge? No  Dietary orders reviewed? Yes Do you have support at home? Yes   Home Care and Equipment/Supplies: Were home health services ordered? no If so, what is the name of the agency? na  Has the agency set up a time to come to the patient's home? not applicable Were any new equipment or medical supplies ordered?  No What is the name of the medical supply agency? na Were you able to get the supplies/equipment? not applicable Do you have any questions related to the use of the equipment or supplies? No  Functional Questionnaire: (I = Independent and D = Dependent) ADLs: I  Bathing/Dressing- I  Meal Prep- I  Eating- I  Maintaining continence- I  Transferring/Ambulation- I  Managing Meds- I- husband helps   Follow up appointments reviewed:  PCP Hospital f/u appt confirmed? No-no availability on the schedule - pt needs to be seen for hospital fu by 01/18/22- will ask front desk to call pt and make appt  Specialist Hospital f/u appt confirmed? Yes  Scheduled to see Dr Kathlen Mody on 01-17-22 @ 335pm. And Dr Rockey Situ 01-17-22 at 315pm Are transportation arrangements needed? No  If their condition worsens, is the pt aware to call PCP or go to the Emergency Dept.? Yes Was the patient provided with contact information for the PCP's office or ED? Yes Was to pt encouraged to call back with questions or concerns? Yes   Juanda Crumble LPN [place] Direct Dial [phone]

## 2022-01-16 ENCOUNTER — Telehealth (HOSPITAL_COMMUNITY): Payer: Self-pay

## 2022-01-16 ENCOUNTER — Ambulatory Visit
Admit: 2022-01-16 | Discharge: 2022-01-16 | Disposition: A | Discharge status: Home / Self Care | Payer: Medicare HMO | Attending: Cardiology | Admitting: Cardiology

## 2022-01-16 ENCOUNTER — Ambulatory Visit (INDEPENDENT_AMBULATORY_CARE_PROVIDER_SITE_OTHER): Payer: Medicare HMO

## 2022-01-16 ENCOUNTER — Other Ambulatory Visit (HOSPITAL_COMMUNITY): Payer: Self-pay

## 2022-01-16 VITALS — BP 136/78 | HR 80 | Wt 121.8 lb

## 2022-01-16 DIAGNOSIS — D649 Anemia, unspecified: Secondary | ICD-10-CM | POA: Diagnosis not present

## 2022-01-16 DIAGNOSIS — Z79899 Other long term (current) drug therapy: Secondary | ICD-10-CM | POA: Diagnosis not present

## 2022-01-16 DIAGNOSIS — E781 Pure hyperglyceridemia: Secondary | ICD-10-CM | POA: Diagnosis not present

## 2022-01-16 DIAGNOSIS — R5382 Chronic fatigue, unspecified: Secondary | ICD-10-CM | POA: Insufficient documentation

## 2022-01-16 DIAGNOSIS — I959 Hypotension, unspecified: Secondary | ICD-10-CM | POA: Diagnosis not present

## 2022-01-16 DIAGNOSIS — M549 Dorsalgia, unspecified: Secondary | ICD-10-CM | POA: Diagnosis not present

## 2022-01-16 DIAGNOSIS — Z87440 Personal history of urinary (tract) infections: Secondary | ICD-10-CM | POA: Diagnosis not present

## 2022-01-16 DIAGNOSIS — I272 Pulmonary hypertension, unspecified: Secondary | ICD-10-CM | POA: Insufficient documentation

## 2022-01-16 DIAGNOSIS — J449 Chronic obstructive pulmonary disease, unspecified: Secondary | ICD-10-CM | POA: Diagnosis not present

## 2022-01-16 DIAGNOSIS — I255 Ischemic cardiomyopathy: Secondary | ICD-10-CM

## 2022-01-16 DIAGNOSIS — Z955 Presence of coronary angioplasty implant and graft: Secondary | ICD-10-CM | POA: Insufficient documentation

## 2022-01-16 DIAGNOSIS — R69 Illness, unspecified: Secondary | ICD-10-CM | POA: Diagnosis not present

## 2022-01-16 DIAGNOSIS — D509 Iron deficiency anemia, unspecified: Secondary | ICD-10-CM

## 2022-01-16 DIAGNOSIS — I083 Combined rheumatic disorders of mitral, aortic and tricuspid valves: Secondary | ICD-10-CM | POA: Diagnosis not present

## 2022-01-16 DIAGNOSIS — B1920 Unspecified viral hepatitis C without hepatic coma: Secondary | ICD-10-CM | POA: Insufficient documentation

## 2022-01-16 DIAGNOSIS — I2510 Atherosclerotic heart disease of native coronary artery without angina pectoris: Secondary | ICD-10-CM | POA: Insufficient documentation

## 2022-01-16 DIAGNOSIS — G8929 Other chronic pain: Secondary | ICD-10-CM | POA: Insufficient documentation

## 2022-01-16 DIAGNOSIS — I340 Nonrheumatic mitral (valve) insufficiency: Secondary | ICD-10-CM | POA: Diagnosis not present

## 2022-01-16 DIAGNOSIS — I3139 Other pericardial effusion (noninflammatory): Secondary | ICD-10-CM | POA: Insufficient documentation

## 2022-01-16 DIAGNOSIS — I5042 Chronic combined systolic (congestive) and diastolic (congestive) heart failure: Secondary | ICD-10-CM | POA: Insufficient documentation

## 2022-01-16 DIAGNOSIS — I447 Left bundle-branch block, unspecified: Secondary | ICD-10-CM | POA: Diagnosis not present

## 2022-01-16 LAB — BASIC METABOLIC PANEL
Anion gap: 10 (ref 5–15)
BUN: 21 mg/dL (ref 8–23)
CO2: 26 mmol/L (ref 22–32)
Calcium: 9.4 mg/dL (ref 8.9–10.3)
Chloride: 106 mmol/L (ref 98–111)
Creatinine, Ser: 1.21 mg/dL — ABNORMAL HIGH (ref 0.44–1.00)
GFR, Estimated: 45 mL/min — ABNORMAL LOW (ref >60)
Glucose, Bld: 101 mg/dL — ABNORMAL HIGH (ref 70–99)
Potassium: 4.2 mmol/L (ref 3.5–5.1)
Sodium: 142 mmol/L (ref 135–145)

## 2022-01-16 LAB — IRON AND TIBC
Iron: 65 ug/dL (ref 28–170)
Saturation Ratios: 17 % (ref 10.4–31.8)
TIBC: 389 ug/dL (ref 250–450)
UIBC: 324 ug/dL

## 2022-01-16 LAB — FERRITIN: Ferritin: 75 ng/mL (ref 11–307)

## 2022-01-16 LAB — BRAIN NATRIURETIC PEPTIDE: B Natriuretic Peptide: >4500 pg/mL — ABNORMAL HIGH (ref 0.0–100.0)

## 2022-01-16 MED ORDER — TORSEMIDE 20 MG PO TABS: 20.0000 mg | ORAL_TABLET | Freq: Every day | ORAL | 2 refills | Status: DC

## 2022-01-16 NOTE — Patient Instructions (Addendum)
    After Your Pacemaker   Monitor your pacemaker site for redness, swelling, and drainage. Call the device clinic at [phone] if you experience these symptoms or fever/chills.  Your incision was closed with Steri-strips or staples:  You may shower 7 days after your procedure and wash your incision with soap and water. Avoid lotions, ointments, or perfumes over your incision until it is well-healed.  You have developed a small hematoma at your device wound site.  Continue to monitor the area for any increase in swelling or signs of infection.  If any concerns or does not continue to improve over the next 1-2 weeks, call us back at the number above.    You may use a hot tub or a pool after your wound check appointment if the incision is completely closed.  Do not lift, push or pull greater than 10 pounds with the affected arm until 6 weeks after your procedure. There are no other restrictions in arm movement after your wound check appointment. Until after December 13th.    You may drive, unless driving has been restricted by your healthcare providers.  Remote monitoring is used to monitor your pacemaker from home. This monitoring is scheduled every 91 days by our office. It allows [place] to keep an eye on the functioning of your device to ensure it is working properly. You will routinely see your Electrophysiologist annually (more often if necessary).

## 2022-01-16 NOTE — Progress Notes (Signed)
[facility] Transitions of [name] Encounter  PCP: No primary care provider on file. PCP-Cardiologist: Ida Rogue, MD  HPI:  81 yo F with PMH of CHF, CAD, COPD, LBBB, paralysis of vocal cords, HTN, and HLD.  Last admitted in 06/2021 after a fall from hip fracture. Postop course complicated by volume overload. ECHO at that time with EF 25-30%. R/LHC showed severe proximal and mid LAD stenosis s/p PCI with 2 DES, patent RCA stents, and mildly elevated filling pressures with mildly reduced cardiac output (RA 7, PA 30, wedge 22, CO 3.3, CI 2.0). GDMT limited by hypotension requiring midodrine at discharge.  Presented to the ED on 10/30 with shortness of breath, fatigue, and LE edema. CXR with mild to moderate bilateral pleural effusions and mild interstitial pulmonary edema. BNP >4500. ECHO 10/31 showed LVEF <20%, global hypokinesis, mild LVH, RV mildly reduced. Prior ECHO 06/2021 with LVEF 25-30%. EP consulted and placed BiV CRT-P on 11/1. Discharged on 11/3 with torsemide 20 mg daily, Toprol 12.5 mg daily, and midodrine 2.5 mg TID (although midodrine was discontinued during her admission).  Today, DESARAE PLACIDE presents to the [facility] for follow up. She denies any shortness of breath, orthopnea, edema, lightheadedness, and dizziness. She is checking her weights daily at home and reports these have been stable ~120s. She checks her BP at home as well. She follows a fluid and sodium restricted diet at home and takes all medications as prescribed.   HF Medications: Diuretic: torsemide 20 mg daily Beta Blocker: metoprolol XL 12.5 mg daily *also taking midodrine 2.5 mg TID  Has the patient been experiencing any side effects to the medications prescribed?  Yes - constipation with iron pills, not interested in IV iron  Does the patient have any problems obtaining medications due to transportation or finances?   No - has [employer]  Understanding of regimen: good Understanding of indications: good Potential of compliance: good Patient understands to avoid NSAIDs. Patient understands to avoid decongestants.   Pertinent Lab Values: Serum creatinine 1.21, BUN 21, Potassium 4.2, Sodium 142, BNP >4500  TSAT 17, Ferritin 75  Vital Signs: Weight: 121 lbs (discharge weight: 119 lbs) Blood pressure: 136/78 mmHg  Heart rate: 80 bpm   Medication Assistance / Insurance Benefits Check: Does the patient have prescription insurance?  Yes Type of insurance plan: [employer]  Outpatient Pharmacy:  Current outpatient pharmacy: [facility] Was the [facility] pharmacy used to supply discharge medications? yes  If [facility] pharmacy was used, were the refills transferred out to current pharmacy yet? No, will send refills today Is the patient willing to transition their outpatient pharmacy to utilize a [facility] with or without mail order?   No  Assessment: 1) Chronic systolic CHF (EF <74%), due to ICM. NYHA class II symptoms. - Euvolemic on exam, BNP up, increase to torsemide 40 mg daily - HR 80 today in clinic, continue metoprolol XL 12.5 mg daily - On midodrine 2.5 mg TID, BP has improved and this was actually stopped during her hospitalization. Discontinue. - Consider adding spironolactone 12.5 mg daily and Farxiga 10 mg daily at follow up pending BP trends  Plan: 1) Medication changes: - Stop midodrine 2.5 mg TID - Increase furosemide to 40 mg daily - Consider adding Farxiga 10 mg daily at cardiology follow up  2) Patient Assistance: - Farxiga copay $47 - Jardiance copay $47  3) Follow up: - Next appointment with [facility] on  11/16  Kerby Nora, PharmD, BCPS Heart Failure [facility] of [facility] [phone]

## 2022-01-16 NOTE — Patient Instructions (Signed)
Great to see you today!  Stop taking Midodrine  Labs done today --we will only call with abnormal values.  Follow-up with Cardiology as previously scheduled.   At the [facility], you and your health needs are our priority. We have a designated team specialized in the treatment of Heart Failure. This Care Team includes your primary Heart Failure Specialized Cardiologist (physician), Advanced Practice Providers (APPs- Physician Assistants and Nurse Practitioners), and Pharmacist who all work together to provide you with the care you need, when you need it.   You may see any of the following providers on your designated Care Team at your next follow up:  Dr. Glori Bickers Dr. Loralie Champagne Dr. Roxana Hires, NP Lyda Jester, [place] [facility] [place], [place] Forestine Na, NP Audry Riles, PharmD   Please be sure to bring in all your medications bottles to every appointment.   Need to Contact us:  If you have any questions or concerns before your next appointment please send [place] a message through [place] or call our office at [phone].    TO LEAVE A MESSAGE FOR THE NURSE SELECT OPTION 2, PLEASE LEAVE A MESSAGE INCLUDING: YOUR NAME DATE OF BIRTH CALL BACK NUMBER REASON FOR CALL**this is important as we prioritize the call backs  YOU WILL RECEIVE A CALL BACK THE SAME DAY AS LONG AS YOU CALL BEFORE 4:00 PM

## 2022-01-16 NOTE — Progress Notes (Addendum)
HEART & VASCULAR TRANSITION OF CARE CONSULT NOTE     Referring Physician: Dr. Cathlean Sauer  Primary Care: No primary care provider on file.  Primary Cardiologist: Ida Rogue, MD  HPI: Referred to clinic by Dr. Cathlean Sauer for heart failure consultation.   81 y/o female w/ CAD, chronic systolic heart failure, LBBB, mitral regurgitation, HLD, COPD, hepatitis C, chronic anemia, chronic back pain, vocal cord paralysis and hypotension requiring midodrine.   Had prior stenting to mid RCA and prox LAD in 2016. Echo in 2016 w/ reduced EF 20-25%. EF has remained persistently low.   Required repeat LHC 4/23. The previously placed RCA and LAD stents were still widely patent, however there was 90% stenosis of prox LAD prior to previously placed stent + 80% stenosis distal to stent. Both lesions treated w/ PCI + DES. Echo 4/23 EF 25-30%, RV low normal, mod MR.   Recently readmitted 10/23 w/ a/c CHF. BNP >4500.  Echo showed EF < 20%, LV severely dilated + severe MR.  Mitral valve appeared rheumatic or "post inflammatory" tissue. RVSP mod elevated 55 mmHg, RV mildly reduced, mild-mod AI and mild-mod TR. Diuresed w/ IV Lasix and underwent St Jude CRT-P for left bundle/ AV block and chronic systolic heart failure. A PPM was placed due to the patient's preference. GDMT limited by hypotension requiring midodrine. Decision made not to start on SGLT2i given risk for GU infection (prior h/o UTIs) and new PPM. Referred to [facility] clinic.  Discharge wt 119 lb.   Presents today for follow-up. Here w/ husband. In [place]. Feels tired and fatigued. Little energy. Denies resting dyspnea. No orthopnea/PND. Wt fairly stable since d/c, 121 lb. Has not been very active. Ambulates w/ walker around her house, denies  significant dyspnea w/ basic ADLs. Denies CP. Has been constipated, due to PO iron.   Remains on midodrine 2.5 mg tid. BP 136/78. This is similar to  home BP readings. Denies orthostatic symptoms.   Has f/u in device clinic  this afternoon for wound check. Has f/u w/ cardiology tomorrow.   Says she would not want any additional procedures/ interventions.    Cardiac Testing   Echocardiogram 06/06/2021: Impressions: 1. Left ventricular ejection fraction, by estimation, is 25 to 30%. The  left ventricle has severely decreased function. The left ventricle  demonstrates global hypokinesis. The left ventricular internal cavity size  was mildly dilated. There is moderate  asymmetric left ventricular hypertrophy of the septal segment. Left  ventricular diastolic parameters are consistent with Grade II diastolic  dysfunction (pseudonormalization).   2. Right ventricular systolic function is low normal. The right  ventricular size is mildly enlarged.   3. Left atrial size was moderately dilated.   4. The mitral valve is normal in structure. Moderate mitral valve  regurgitation.   5. The aortic valve is tricuspid. Aortic valve regurgitation is trivial.  _______________   Right/ Left Cardiac Catheterization 06/13/2021: Conclusions: Severe single-vessel coronary artery disease with sequential 80-90% proximal/mid LAD stenoses flanking previously placed, which are highly significant by iFR and involve D1 and D2. Mild-moderate, nonobstructive CAD involving ramus intermedius and RCA. Widely patent mid LAD and mid RCA stents. Mildly elevated left and right heart filling pressures. Mild-moderate pulmonary hypertension. Mildly-moderately reduced Fick cardiac output/index. Successful PCI to proximal LAD using Onyx Frontier 3.0 x 12 mm drug-eluting stent (jailing D1) with 0% residual stenosis and TIMI-3 flow. Successful PCI to mid LAD using Onyx Frontier 2.5 x 15 mm drug-eluting stent (jailing D2) with angioplasty of ostium  of D2.  There is 0% residual stenosis in the LAD and 80% residual stenosis at the ostium of D2 with TIMI-3 flow. Challenging intervention via right radial approach due to tortuosity of the right subclavian  artery and radial artery vasospasm.  Consider alternative access for future catheterizations.   Recommendations: Dual antiplatelet therapy with aspirin and clopidogrel for at least 12 months. Aggressive secondary prevention of coronary artery disease. Continued gentle diuresis. Escalate goal-directed medical therapy for acute on chronic HFrEF.  Diagnostic Dominance: Right  Intervention      Echocardiogram 01/01/2022: Impressions: 1. Left ventricular ejection fraction, by estimation, is <20%. The left  ventricle has severely decreased function. The left ventricle demonstrates  global hypokinesis. The left ventricular internal cavity size was severely  dilated. There is mild  asymmetric left ventricular hypertrophy. Left ventricular diastolic  parameters are indeterminate. There is no LV thrombus.   2. Right ventricular systolic function is mildly reduced. The right  ventricular size is normal. There is moderately elevated pulmonary artery  systolic pressure. The estimated right ventricular systolic pressure is  48.1 mmHg.   3. Left atrial size was severely dilated.   4. A small pericardial effusion is present. The pericardial effusion is  anterior to the right ventricle and posterior to the left ventricle.   5. 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 Severe  mitral valve regurgitation. No evidence of mitral stenosis. The mean  mitral valve gradient is 5.0 mmHg. Central ventricular MR with A2-P2 worst  pathology and pulmonary vein flow  reversal.   6. Tricuspid valve regurgitation is mild to moderate.   7. The aortic valve is tricuspid. There is mild thickening of the aortic  valve. Aortic valve regurgitation is mild to moderate. No aortic stenosis  is present.   Comparison(s): Further decrease in LV size and function; increase in MR.   Review of Systems: [y] = yes, [ ]  = no   General: Weight gain [ ] ; Weight loss [ ] ; Anorexia [ ] ; Fatigue [Y ]; Fever  [ ] ; Chills [ ] ; Weakness [Y ]  Cardiac: Chest pain/pressure [ ] ; Resting SOB [ ] ; Exertional SOB [ ] ; Orthopnea [ ] ; Pedal Edema [ ] ; Palpitations [ ] ; Syncope [ ] ; Presyncope [ ] ; Paroxysmal nocturnal dyspnea[ ]   Pulmonary: Cough [ ] ; Wheezing[ ] ; Hemoptysis[ ] ; Sputum [ ] ; Snoring [ ]   GI: Vomiting[ ] ; Dysphagia[ ] ; Melena[ ] ; Hematochezia [ ] ; Heartburn[ ] ; Abdominal pain [ ] ; Constipation [ Y]; Diarrhea [ ] ; BRBPR [ ]   GU: Hematuria[ ] ; Dysuria [ ] ; Nocturia[ ]   Vascular: Pain in legs with walking [ ] ; Pain in feet with lying flat [ ] ; Non-healing sores [ ] ; Stroke [ ] ; TIA [ ] ; Slurred speech [ ] ;  Neuro: Headaches[ ] ; Vertigo[ ] ; Seizures[ ] ; Paresthesias[ ] ;Blurred vision [ ] ; Diplopia [ ] ; Vision changes [ ]   Ortho/Skin: Arthritis [ ] ; Joint pain [ ] ; Muscle pain [ Y]; Joint swelling [ ] ; Back Pain [ ] ; Rash [ ]   Psych: Depression[ ] ; Anxiety[ ]   Heme: Bleeding problems [ ] ; Clotting disorders [ ] ; Anemia [Y ]  Endocrine: Diabetes [ ] ; Thyroid dysfunction[ ]    Past Medical History:  Diagnosis Date   Anxiety    Arthritis    CAD (coronary artery disease)    a. cardiac cath 10/2014: ostial LAD 30%, prox LAD 90% s/p PCI/DES, ostial RCA 30%, mid RCA 90% s/p PCI/DES, ostial Ramus 50%   Chronic back pain  Chronic fatigue    Chronic systolic CHF (congestive heart failure) ([place])    a. 10/2014 Echo: EF 20%; b. 06/2017 Echo: EF 20-25%, diff HK, Gr2 DD, Mild AI. Mod to sev MR. Mod dil LA.   Hepatitis C    Ischemic cardiomyopathy    a. 10/2014 Echo: EF 20%; b. 06/2017 Echo: EF 20-25%.   LBBB (left bundle branch block)    Moderate to Severe Mitral regurgitation    a. 06/2017 Echo: Mod-Sev MR.   Osteoporosis    UTI (lower urinary tract infection)     Current Outpatient Medications  Medication Sig Dispense Refill   albuterol (VENTOLIN HFA) 108 (90 Base) MCG/ACT inhaler Inhale 2 puffs into the lungs every 6 (six) hours as needed for wheezing or shortness of breath.     ALPRAZolam (XANAX) 0.25  MG tablet TAKE ONE TABLET BY MOUTH ONCE TO TWICE DAILY AS NEEDED (Patient taking differently: Take 0.25 mg by mouth 2 (two) times daily as needed for anxiety.) 60 tablet 5   aspirin 81 MG chewable tablet Chew 1 tablet (81 mg total) by mouth daily.     citalopram (CELEXA) 10 MG tablet Take 1 tablet (10 mg total) by mouth daily. 90 tablet 3   clopidogrel (PLAVIX) 75 MG tablet TAKE ONE TABLET BY MOUTH ONE TIME DAILY 90 tablet 2   denosumab (PROLIA) 60 MG/ML SOSY injection Inject 60 mg into the skin every 6 (six) months. 1 mL 2   ipratropium (ATROVENT) 0.06 % nasal spray Place 2 sprays into both nostrils 4 (four) times daily. 15 mL 11   metoprolol succinate (TOPROL-XL) 25 MG 24 hr tablet Take 0.5 tablets (12.5 mg total) by mouth daily. 45 tablet 0   midodrine (PROAMATINE) 2.5 MG tablet Take 1 tablet (2.5 mg total) by mouth 3 (three) times daily with meals. 270 tablet 3   ondansetron (ZOFRAN-ODT) 4 MG disintegrating tablet TAKE 1 TABLET BY MOUTH TWICE A DAY AS NEEDED FOR NAUSEA AND VOMITING appt further refills no exceptions (Patient taking differently: Take 4 mg by mouth 2 (two) times daily as needed for nausea or vomiting.) 60 tablet 11   oxyCODONE (OXY IR/ROXICODONE) 5 MG immediate release tablet Take 1 tablet (5 mg total) by mouth 2 (two) times daily as needed for moderate pain (pain score 4-6). 60 tablet 0   rosuvastatin (CRESTOR) 40 MG tablet TAKE ONE TABLET BY MOUTH ONE TIME DAILY AT 6PM (Patient taking differently: Take 40 mg by mouth daily.) 90 tablet 0   torsemide (DEMADEX) 20 MG tablet Take 1 tablet (20 mg total) by mouth daily. Take twice day in case of weight increase 2 to 3 lbs in 24 hrs or 5 lbs in 7 days. 30 tablet 0   No current facility-administered medications for this encounter.    Allergies  Allergen Reactions   Sugar-Protein-Starch Nausea And Vomiting    Only to sugar      Social History   Socioeconomic History   Marital status: Married    Spouse name: Teran Daughenbaugh   Number  of children: 0   Years of education: 14   Highest education level: Not on file  Occupational History   Occupation: Receptionist    Comment: Retired in [place] Use   Smoking status: Former    Packs/day: 1.00    Years: 10.00    Total pack years: 10.00    Types: Cigarettes   Smokeless tobacco: Never  Vaping Use   Vaping Use: Never used  Substance and  Sexual Activity   Alcohol use: No    Alcohol/week: 0.0 standard drinks of alcohol   Drug use: No   Sexual activity: Yes    Birth control/protection: Post-menopausal  Other Topics Concern   Not on file  Social History Narrative   Pt is 81 yo female, married to husband of 21 years.  Pt has no children.  Pt lives with husband and teacup poodle, [name]. 62 y.o as of 01/01/19.  Pt states she hurt her back last year and has not been able to participate in much physical activity since. Pt enjoys traveling with her husband in motor home, doing crossword puzzles with husband, and playing cards with friends.  Pt was raised in [place] and has lived in [place] entire adult life.  Pt worked in [employer] in [place] until [place] when they closed.     Social Determinants of Health   Financial Resource Strain: Low Risk  (01/02/2022)   Overall Financial Resource Strain (CARDIA)    Difficulty of Paying Living Expenses: Not hard at all  Food Insecurity: No Food Insecurity (01/02/2022)   Hunger Vital Sign    Worried About Running Out of Food in the Last Year: Never true    Ran Out of Food in the Last Year: Never true  Transportation Needs: No Transportation Needs (01/02/2022)   PRAPARE - Hydrologist (Medical): No    Lack of Transportation (Non-Medical): No  Physical Activity: Insufficiently Active (03/01/2021)   Exercise Vital Sign    Days of Exercise per Week: 5 days    Minutes of Exercise per Session: 10 min  Stress: No Stress Concern Present (03/01/2021)   [place]    Feeling of Stress : Not at all  Social Connections: Unknown (03/01/2021)   Social Connection and Isolation Panel [NHANES]    Frequency of Communication with Friends and Family: Not on file    Frequency of Social Gatherings with Friends and Family: Not on file    Attends Religious Services: Not on file    Active Member of Clubs or Organizations: Not on file    Attends Archivist Meetings: Not on file    Marital Status: Married  Intimate Partner Violence: Not At Risk (01/03/2022)   Humiliation, Afraid, Rape, and Kick questionnaire    Fear of Current or Ex-Partner: No    Emotionally Abused: No    Physically Abused: No    Sexually Abused: No      Family History  Problem Relation Age of Onset   Arthritis Mother    Heart disease Father        passed at 19   Diabetes Father     Vitals:   01/16/22 1043  BP: 136/78  Pulse: 80  SpO2: 92%  Weight: 55.2 kg (121 lb 12.8 oz)    PHYSICAL EXAM: General: frail, fatigue appearing. No respiratory difficulty HEENT: normal Neck: supple. JVD not elevated. Carotids 2+ bilat; no bruits. No lymphadenopathy or thryomegaly appreciated. Cor: PMI nondisplaced. Regular rate & rhythm. 3/6 murmur  Lungs: clear Abdomen: soft, nontender, nondistended. No hepatosplenomegaly. No bruits or masses. Good bowel sounds. Extremities: no cyanosis, clubbing, rash, trace b/l LE edema Neuro: alert & oriented x 3, cranial nerves grossly intact. moves all 4 extremities w/o difficulty. Affect pleasant.  ECG: Not performed    ASSESSMENT & PLAN:  1. Chronic Systolic Heart Failure - Ischemic CM w/ LBBB. CAD w/ recent LAD  stenting as outlined above  - EF persistently low since 2016. Most recent echo 10/23 EF < 20%, RV mildly reduced, Severe MR - s/p CRT-P 11/23. PPM elected vs ICD per pt preference. Hopefully CRT will help to improve LVEF/ MR  - NYHA Class II - GDMT had been limited by hypotension requiring midodrine.  SBPs 130s in clinic today and at home. Will stop midodrine today to reduce afterload. If BP remains stable off support, can trial restart of low does GDMT (spiro, losartan)  - Continue Toprol XL 12.5 mg daily  - Avoiding SGLT2i given risk for GU infection and new PPM  - not candidate for advanced therapies given age and other commodities  - check BMP/ BNP today   2. Severe MR - suspect combination primary and secondary MR - Echo 10/23 mitral valve tissue appeared rheumatic or "post inflammatory". LV severely dilated + dyssynchrony from LBBB  - hopefully MR will improve w/ optimization of HF/ CRT. Gen cardiology to follow w/ surveillance echos  - we discussed possibility of MitraClip but says she would not want any further procedures/ interventions  3. CAD - s/p RCA + LAD stenting in 2016 - s/p recent LAD PCI 4/23  - stable w/o CP  - ASA + Plavix  - ? blocker + statin   4. LBBB - EF < 20% - s/p CRT-P (pt declined ICD)   5. Hypotension  - was discharged home w/ midodrine, SBPs 130s at home and in clinic today  - Stop midodrine. Instructed to monitor BP closely at home     NYHA II GDMT  Diuretic- Torsemide 20 mg daily  BB- Toprol XL 12.5 mg daily  Ace/ARB/ARNI Not yet (soft BP)  MRA Not yet (soft BP)  SGLT2i No, risk for GU infection     Referred to HFSW (PCP, Medications, Transportation, ETOH Abuse, Drug Abuse, Insurance, Museum/gallery curator ): No  Refer to Pharmacy: No  Refer to Home Health: No Refer to Advanced Heart Failure Clinic: No  Refer to General Cardiology: Yes  Follow up: keep w/ gen Cards

## 2022-01-16 NOTE — Telephone Encounter (Signed)
Patient aware and agreeable. 

## 2022-01-17 ENCOUNTER — Ambulatory Visit: Payer: Medicare HMO | Admitting: Medical

## 2022-01-17 ENCOUNTER — Encounter: Payer: Self-pay | Admitting: Medical

## 2022-01-17 VITALS — BP 126/60 | HR 85 | Ht 60.0 in | Wt 120.0 lb

## 2022-01-17 DIAGNOSIS — I340 Nonrheumatic mitral (valve) insufficiency: Secondary | ICD-10-CM | POA: Diagnosis not present

## 2022-01-17 DIAGNOSIS — I5042 Chronic combined systolic (congestive) and diastolic (congestive) heart failure: Secondary | ICD-10-CM

## 2022-01-17 DIAGNOSIS — I2510 Atherosclerotic heart disease of native coronary artery without angina pectoris: Secondary | ICD-10-CM

## 2022-01-17 DIAGNOSIS — I255 Ischemic cardiomyopathy: Secondary | ICD-10-CM

## 2022-01-17 DIAGNOSIS — I447 Left bundle-branch block, unspecified: Secondary | ICD-10-CM

## 2022-01-17 DIAGNOSIS — I959 Hypotension, unspecified: Secondary | ICD-10-CM | POA: Diagnosis not present

## 2022-01-17 LAB — CUP PACEART INCLINIC DEVICE CHECK
Brady Statistic RA Percent Paced: 1 % — CL
Date Time Interrogation Session: 20231115125343
Implantable Lead Connection Status: 753985
Implantable Lead Connection Status: 753985
Implantable Lead Connection Status: 753985
Implantable Lead Implant Date: 20231101
Implantable Lead Implant Date: 20231101
Implantable Lead Implant Date: 20231101
Implantable Lead Location: 753858
Implantable Lead Location: 753859
Implantable Lead Location: 753860
Implantable Pulse Generator Implant Date: 20231101
Lead Channel Impedance Value: 350 Ohm
Lead Channel Impedance Value: 350 Ohm
Lead Channel Impedance Value: 350 Ohm
Lead Channel Impedance Value: 360 Ohm
Lead Channel Impedance Value: 360 Ohm
Lead Channel Impedance Value: 740 Ohm
Lead Channel Pacing Threshold Amplitude: 0.5 V
Lead Channel Pacing Threshold Amplitude: 0.5 V
Lead Channel Pacing Threshold Amplitude: 1 V
Lead Channel Pacing Threshold Pulse Width: 0.5 ms
Lead Channel Pacing Threshold Pulse Width: 0.5 ms
Lead Channel Pacing Threshold Pulse Width: 0.5 ms
Lead Channel Sensing Intrinsic Amplitude: 3.4 mV
Lead Channel Sensing Intrinsic Amplitude: 7.6 mV
Pulse Gen Model: 3562
Pulse Gen Serial Number: 812818

## 2022-01-17 MED ORDER — TORSEMIDE 20 MG PO TABS: 40.0000 mg | ORAL_TABLET | Freq: Every day | ORAL | 3 refills | Status: DC

## 2022-01-17 NOTE — Patient Instructions (Signed)
Medication Instructions:  Your physician has recommended you make the following change in your medication:   1.Torsemide 40 mg once daily  *If you need a refill on your cardiac medications before your next appointment, please call your pharmacy*   Lab Work: BMET in 2 weeks. No appointment is needed. Go to the following location: [facility] Entrance at [facility] 1st desk on the right to check in (REGISTRATION)  Lab hours: Monday- Friday (7:30 am- 5:30 pm)  If you have labs (blood work) drawn today and your tests are completely normal, you will receive your results only by: [place] (if you have MyChart) OR A paper copy in the mail If you have any lab test that is abnormal or we need to change your treatment, we will call you to review the results.   Testing/Procedures: None   Follow-Up: At [facility], you and your health needs are our priority.  As part of our continuing mission to provide you with exceptional heart care, we have created designated Provider Care Teams.  These Care Teams include your primary Cardiologist (physician) and Advanced Practice Providers (APPs -  Physician Assistants and Nurse Practitioners) who all work together to provide you with the care you need, when you need it.  Your next appointment:   1 month(s)  The format for your next appointment:   In Person  Provider:   Ida Rogue, MD or Cadence Kathlen Mody, [place]       Important Information About Sugar

## 2022-01-17 NOTE — Progress Notes (Signed)
Wound check appointment. Steri-strips removed. Wound without redness or edema. Incision edges approximated, wound well healed. Normal device function. Thresholds, sensing, and impedances consistent with implant measurements. Device programmed at 3.5V/auto capture programmed on for extra safety margin until 3 month visit. Histogram distribution appropriate for patient and level of activity. 19 PMT episodes noted Dr. Lovena Le aware, 2 brief AT episodes both 1:1 A to V conduction longest 14 secs with V rate control. Patient educated about wound care, arm mobility, lifting restrictions. ROV in 3 months with implanting physician.

## 2022-01-17 NOTE — Progress Notes (Signed)
Cardiology Office Note:    Date:  01/17/2022   ID:  [name], DOB 06-28-1940, MRN [id number]  PCP:  Carollee Leitz, MD  [facility] Cardiologist:  Ida Rogue, MD  [facility] Electrophysiologist:  Cristopher Peru, MD   Referring MD: Carollee Leitz, MD   Chief Complaint: Hospital follow-up  History of Present Illness:    [name] is a 81 y.o. female with a hx of CAD, chronic systolic heart failure, LBBB, mitral regurgitation, hyperlipidemia, COPD, hepatitis C, chronic anemia, chronic back pain, vocal cord paralysis and hypotension requiring midodrine who presents for hospital follow-up.  He had prior stenting to the mid RCA and proximal LAD in 2016.  Echo in 2016 showed reduced EF 20 to 25%.  EF has remained persistently low.  Required repeat left heart cath 423.  The previously placed RCA and LAD stents were still widely patent, however there was a 90% stenosis of the proximal LAD prior to previously placed stent +80% stenosis distal to the stent.  Both lesions were treated with PCI/DES.  Echo 423 showed EF 25 to 30%, RV low normal, moderate MR.  Patient was admitted 1023 with acute CHF.  BNP greater than 4500.  Echo showed EF less than 20%, LV severely dilated plus severe MR.  Mitral valve appeared rheumatic or " postinflammatory" tissue.  RVSP moderately elevated at 55 mmHg, RV mildly reduced, mild to moderate AI and mild to moderate TR.  He was diuresed with IV Lasix and underwent Saint Jude CRT-P for left bundle/AV block and chronic systolic heart failure.  A pacemaker was placed due to patient's preference.  Guideline directed medical therapy was limited by hypotension requiring midodrine.  Decision made to not start SGLT2 inhibitor given risk for GU infection (prior history of UTIs) and new pacemaker.  Refer to the [facility] clinic.  Patient was seen by advanced heart failure team 01/16/2022.  He was tired and fatigued.  He was on midodrine 2.5 mg 3 times daily.  Midodrine was  stopped due to SBPs in the 130s.  He is on Toprol 12.5 mg daily.  MitraClip was discussed, however patient would not want any further procedures/interventions. BMET and BNP were ordered. BNP came back >4500 and torsemide was increased to 40mg  daily. BMET was stable.  Today, the patient reports she has chronic back pain, however it has been worse since her recent hospitalization. She is using a walker at home, which she was using before the hospitalization. She has a paralyzed vocal chords. She denies chest pain or shortness of breath. She is slowly getting back to previous functional status. Due to elevated BNP, torsemide was increased from 20 mg to 40mg  daily. She has leg edema on exam, L>R.  The device wound overall looks good, just has some bruising.   Past Medical History:  Diagnosis Date   Anxiety    Arthritis    CAD (coronary artery disease)    a. cardiac cath 10/2014: ostial LAD 30%, prox LAD 90% s/p PCI/DES, ostial RCA 30%, mid RCA 90% s/p PCI/DES, ostial Ramus 50%   Chronic back pain    Chronic fatigue    Chronic systolic CHF (congestive heart failure) ([place])    a. 10/2014 Echo: EF 20%; b. 06/2017 Echo: EF 20-25%, diff HK, Gr2 DD, Mild AI. Mod to sev MR. Mod dil LA.   Hepatitis C    Ischemic cardiomyopathy    a. 10/2014 Echo: EF 20%; b. 06/2017 Echo: EF 20-25%.   LBBB (left bundle branch  block)    Moderate to Severe Mitral regurgitation    a. 06/2017 Echo: Mod-Sev MR.   Osteoporosis    UTI (lower urinary tract infection)     Past Surgical History:  Procedure Laterality Date   BACK SURGERY  07/14   T10, L1   BIV PACEMAKER INSERTION CRT-P N/A 01/02/2022   Procedure: BIV PACEMAKER INSERTION CRT-P;  Surgeon: Evans Lance, MD;  Location: [place] [facility];  Service: Cardiovascular;  Laterality: N/A;   CARDIAC CATHETERIZATION Bilateral 10/27/2014   Procedure: Left Heart Cath and Coronary Angiography;  Surgeon: Minna Merritts, MD;  Location: [place] [facility];  Service:  Cardiovascular;  Laterality: Bilateral;   CARDIAC CATHETERIZATION N/A 10/27/2014   Procedure: Coronary Stent Intervention;  Surgeon: Wellington Hampshire, MD;  Location: [place] [facility];  Service: Cardiovascular;  Laterality: N/A;   CORONARY STENT INTERVENTION N/A 06/13/2021   Procedure: CORONARY STENT INTERVENTION;  Surgeon: Nelva Bush, MD;  Location: [place] [facility];  Service: Cardiovascular;  Laterality: N/A;   HIP ARTHROPLASTY Left 06/07/2021   Procedure: ARTHROPLASTY BIPOLAR HIP (HEMIARTHROPLASTY);  Surgeon: Leim Fabry, MD;  Location: [facility];  Service: Orthopedics;  Laterality: Left;   INTRAVASCULAR PRESSURE WIRE/FFR STUDY N/A 06/13/2021   Procedure: INTRAVASCULAR PRESSURE WIRE/FFR STUDY;  Surgeon: Nelva Bush, MD;  Location: [place] [facility];  Service: Cardiovascular;  Laterality: N/A;   RIGHT/LEFT HEART CATH AND CORONARY ANGIOGRAPHY N/A 06/13/2021   Procedure: RIGHT/LEFT HEART CATH AND CORONARY ANGIOGRAPHY;  Surgeon: Nelva Bush, MD;  Location: [place] [facility];  Service: Cardiovascular;  Laterality: N/A;    Current Medications: Current Meds  Medication Sig   albuterol (VENTOLIN HFA) 108 (90 Base) MCG/ACT inhaler Inhale 2 puffs into the lungs every 6 (six) hours as needed for wheezing or shortness of breath.   ALPRAZolam (XANAX) 0.25 MG tablet TAKE ONE TABLET BY MOUTH ONCE TO TWICE DAILY AS NEEDED (Patient taking differently: Take 0.25 mg by mouth 2 (two) times daily as needed for anxiety.)   aspirin 81 MG chewable tablet Chew 1 tablet (81 mg total) by mouth daily.   citalopram (CELEXA) 10 MG tablet Take 1 tablet (10 mg total) by mouth daily.   clopidogrel (PLAVIX) 75 MG tablet TAKE ONE TABLET BY MOUTH ONE TIME DAILY   denosumab (PROLIA) 60 MG/ML SOSY injection Inject 60 mg into the skin every 6 (six) months.   ipratropium (ATROVENT) 0.06 % nasal spray Place 2 sprays into both nostrils 4 (four) times daily.   metoprolol succinate (TOPROL-XL) 25 MG 24 hr  tablet Take 0.5 tablets (12.5 mg total) by mouth daily.   ondansetron (ZOFRAN-ODT) 4 MG disintegrating tablet TAKE 1 TABLET BY MOUTH TWICE A DAY AS NEEDED FOR NAUSEA AND VOMITING appt further refills no exceptions (Patient taking differently: Take 4 mg by mouth 2 (two) times daily as needed for nausea or vomiting.)   oxyCODONE (OXY IR/ROXICODONE) 5 MG immediate release tablet Take 1 tablet (5 mg total) by mouth 2 (two) times daily as needed for moderate pain (pain score 4-6).   rosuvastatin (CRESTOR) 40 MG tablet TAKE ONE TABLET BY MOUTH ONE TIME DAILY AT 6PM (Patient taking differently: Take 40 mg by mouth daily.)   [DISCONTINUED] torsemide (DEMADEX) 20 MG tablet Take 1 tablet (20 mg total) by mouth daily. Take twice day in case of weight increase 2 to 3 lbs in 24 hrs or 5 lbs in 7 days.     Allergies:   Sugar-protein-starch   Social History  Socioeconomic History   Marital status: Married    Spouse name: Devina Bezold   Number of children: 0   Years of education: 14   Highest education level: Not on file  Occupational History   Occupation: Receptionist    Comment: Retired in  [place] Use   Smoking status: Former    Packs/day: 1.00    Years: 10.00    Total pack years: 10.00    Types: Cigarettes   Smokeless tobacco: Never  Vaping Use   Vaping Use: Never used  Substance and Sexual Activity   Alcohol use: No    Alcohol/week: 0.0 standard drinks of alcohol   Drug use: No   Sexual activity: Yes    Birth control/protection: Post-menopausal  Other Topics Concern   Not on file  Social History Narrative   Pt is 81 yo female, married to husband of 21 years.  Pt has no children.  Pt lives with husband and teacup poodle, [name]. 2 y.o as of 01/01/19.  Pt states she hurt her back last year and has not been able to participate in much physical activity since. Pt enjoys traveling with her husband in motor home, doing crossword puzzles with husband, and playing cards with friends.  Pt was  raised in [place] and has lived in [place] entire adult life.  Pt worked in [employer] in [place] until [place] when they closed.     Social Determinants of Health   Financial Resource Strain: Low Risk  (01/02/2022)   Overall Financial Resource Strain (CARDIA)    Difficulty of Paying Living Expenses: Not hard at all  Food Insecurity: No Food Insecurity (01/02/2022)   Hunger Vital Sign    Worried About Running Out of Food in the Last Year: Never true    Ran Out of Food in the Last Year: Never true  Transportation Needs: No Transportation Needs (01/02/2022)   PRAPARE - Hydrologist (Medical): No    Lack of Transportation (Non-Medical): No  Physical Activity: Insufficiently Active (03/01/2021)   Exercise Vital Sign    Days of Exercise per Week: 5 days    Minutes of Exercise per Session: 10 min  Stress: No Stress Concern Present (03/01/2021)   [place]    Feeling of Stress : Not at all  Social Connections: Unknown (03/01/2021)   Social Connection and Isolation Panel [NHANES]    Frequency of Communication with Friends and Family: Not on file    Frequency of Social Gatherings with Friends and Family: Not on file    Attends Religious Services: Not on file    Active Member of Clubs or Organizations: Not on file    Attends Archivist Meetings: Not on file    Marital Status: Married     Family History: The patient's family history includes Arthritis in her mother; Diabetes in her father; Heart disease in her father.  ROS:   Please see the history of present illness.     All other systems reviewed and are negative.  EKGs/Labs/Other Studies Reviewed:    The following studies were reviewed today:  Echo 01/01/22  1. Left ventricular ejection fraction, by estimation, is <20%. The left  ventricle has severely decreased function. The left ventricle demonstrates  global  hypokinesis. The left ventricular internal cavity size was severely  dilated. There is mild  asymmetric left ventricular hypertrophy. Left ventricular diastolic  parameters are indeterminate. There is no LV  thrombus.   2. Right ventricular systolic function is mildly reduced. The right  ventricular size is normal. There is moderately elevated pulmonary artery  systolic pressure. The estimated right ventricular systolic pressure is  40.3 mmHg.   3. Left atrial size was severely dilated.   4. A small pericardial effusion is present. The pericardial effusion is  anterior to the right ventricle and posterior to the left ventricle.   5. 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 Severe  mitral valve regurgitation. No evidence of mitral stenosis. The mean  mitral valve gradient is 5.0 mmHg. Central ventricular MR with A2-P2 worst  pathology and pulmonary vein flow  reversal.   6. Tricuspid valve regurgitation is mild to moderate.   7. The aortic valve is tricuspid. There is mild thickening of the aortic  valve. Aortic valve regurgitation is mild to moderate. No aortic stenosis  is present.   Comparison(s): Further decrease in LV size and function; increase in MR.   Cardiac cath 06/2021 Conclusions: Severe single-vessel coronary artery disease with sequential 80-90% proximal/mid LAD stenoses flanking previously placed, which are highly significant by iFR and involve D1 and D2. Mild-moderate, nonobstructive CAD involving ramus intermedius and RCA. Widely patent mid LAD and mid RCA stents. Mildly elevated left and right heart filling pressures. Mild-moderate pulmonary hypertension. Mildly-moderately reduced Fick cardiac output/index. Successful PCI to proximal LAD using Onyx Frontier 3.0 x 12 mm drug-eluting stent (jailing D1) with 0% residual stenosis and TIMI-3 flow. Successful PCI to mid LAD using Onyx Frontier 2.5 x 15 mm drug-eluting stent (jailing D2) with angioplasty of  ostium of D2.  There is 0% residual stenosis in the LAD and 80% residual stenosis at the ostium of D2 with TIMI-3 flow. Challenging intervention via right radial approach due to tortuosity of the right subclavian artery and radial artery vasospasm.  Consider alternative access for future catheterizations.   Recommendations: Dual antiplatelet therapy with aspirin and clopidogrel for at least 12 months. Aggressive secondary prevention of coronary artery disease. Continued gentle diuresis. Escalate goal-directed medical therapy for acute on chronic HFrEF.   Nelva Bush, MD [facility]  Echo 06/2021 1. Left ventricular ejection fraction, by estimation, is 25 to 30%. The  left ventricle has severely decreased function. The left ventricle  demonstrates global hypokinesis. The left ventricular internal cavity size  was mildly dilated. There is moderate  asymmetric left ventricular hypertrophy of the septal segment. Left  ventricular diastolic parameters are consistent with Grade II diastolic  dysfunction (pseudonormalization).   2. Right ventricular systolic function is low normal. The right  ventricular size is mildly enlarged.   3. Left atrial size was moderately dilated.   4. The mitral valve is normal in structure. Moderate mitral valve  regurgitation.   5. The aortic valve is tricuspid. Aortic valve regurgitation is trivial.    Echo 06/2017 Study Conclusions   - Left ventricle: The cavity size was severely dilated. Wall    thickness was normal. Systolic function was severely reduced. The    estimated ejection fraction was in the range of 20% to 25%.    Diffuse hypokinesis. Features are consistent with a pseudonormal    left ventricular filling pattern, with concomitant abnormal    relaxation and increased filling pressure (grade 2 diastolic    dysfunction).  - Aortic valve: There was mild regurgitation.  - Mitral valve: There was moderate to severe regurgitation.  - Left  atrium: The atrium was moderately dilated.  - Pulmonary arteries: Systolic pressure  could not be accurately    estimated.   EKG:  EKG is ordered today.  The ekg ordered today demonstrates A sensed V paced, 85bpm, nonspecific T wave changes  Recent Labs: 09/26/2021: TSH 1.37 01/01/2022: ALT 29; Hemoglobin 14.3; Platelets 174 01/03/2022: Magnesium 2.1 01/16/2022: B Natriuretic Peptide >4,500.0; BUN 21; Creatinine, Ser 1.21; Potassium 4.2; Sodium 142  Recent Lipid Panel    Component Value Date/Time   CHOL 101 01/02/2022 0110   CHOL 176 05/08/2021 1525   TRIG 93 01/02/2022 0110   HDL 40 (L) 01/02/2022 0110   HDL 48 05/08/2021 1525   CHOLHDL 2.5 01/02/2022 0110   VLDL 19 01/02/2022 0110   LDLCALC 42 01/02/2022 0110   LDLCALC 81 05/08/2021 1525   LDLDIRECT 52.0 01/05/2019 1052     Physical Exam:    VS:  BP 126/60 (BP Location: Right Arm, Patient Position: Sitting, Cuff Size: Normal)   Pulse 85   Ht 5' (1.524 m)   Wt 120 lb (54.4 kg)   SpO2 97%   BMI 23.44 kg/m     Wt Readings from Last 3 Encounters:  01/17/22 120 lb (54.4 kg)  01/16/22 121 lb 12.8 oz (55.2 kg)  01/04/22 119 lb 6.4 oz (54.2 kg)     GEN:  Well nourished, well developed in no acute distress HEENT: Normal NECK: No JVD; No carotid bruits LYMPHATICS: No lymphadenopathy CARDIAC: RRR, no murmurs, rubs, gallops RESPIRATORY:  Clear to auscultation without rales, wheezing or rhonchi  ABDOMEN: Soft, non-tender, +distended MUSCULOSKELETAL:  1-2+ lower leg edema; No deformity  SKIN: Warm and dry NEUROLOGIC:  Alert and oriented x 3 PSYCHIATRIC:  Normal affect   ASSESSMENT:    1. Chronic combined systolic and diastolic heart failure ([address])   2. Ischemic cardiomyopathy   3. Mitral valve insufficiency, unspecified etiology   4. Coronary artery disease involving native coronary artery of native heart without angina pectoris   5. LBBB (left bundle branch block)   6. Hypotension, unspecified hypotension type     PLAN:    In order of problems listed above:  Chronic systolic heart failure s/p CRT-P ICM  EF is chronically low. Echo 01/01/22 showed LVEF <20%, RV mildly reduced, severe MR. She is s/p CRT-P. She has stable dyspnea on exertion. GDMT previously limited by hypotension. Midodrine has since been stopped and BP today is 126/60. She is taking Toprol 12.5mg  daily. Plan to avoid SGLT2i due to possible GU infection. Recent BNP was >4500 and Torsemide was increased from 20mg  to 40mg  daily. Patient has lower leg edema on exam today. BMET in 2 weeks. I will see her back in a month to see if we are able to continue addition of GDMT.  Severe MR Previously discussed MitraClip, no plan for further interventions or interventions. Plan to follow with serial echocardiograms.   CAD s/p prior stenting Most recent stenting was to the LAD in 06/2021. Continue DAPT with ASA and Plavix. Continue Toprol and Crestor.   LBBB EF<20%. She is s/p CRT-P  Hypotension Improved, now off midodrine. BP today is 126/60.   Disposition: Follow up in 1 month(s) with MD/APP    Signed, Flavio Lindroth Ninfa Meeker, PA-C  01/17/2022 4:03 PM    [facility]

## 2022-01-21 ENCOUNTER — Telehealth: Payer: Self-pay | Admitting: *Deleted

## 2022-01-21 NOTE — Telephone Encounter (Signed)
$  301 due, pt & spouse aware-PA Required  Appt on 01/23/22 @ 11:30am

## 2022-01-21 NOTE — Telephone Encounter (Signed)
PA initiated via CMM

## 2022-01-22 NOTE — Telephone Encounter (Signed)
Prior PA Approved

## 2022-01-23 ENCOUNTER — Ambulatory Visit (INDEPENDENT_AMBULATORY_CARE_PROVIDER_SITE_OTHER): Payer: Medicare HMO

## 2022-01-23 DIAGNOSIS — M810 Age-related osteoporosis without current pathological fracture: Secondary | ICD-10-CM | POA: Diagnosis not present

## 2022-01-23 MED ORDER — DENOSUMAB 60 MG/ML ~~LOC~~ SOSY
60.0000 mg | PREFILLED_SYRINGE | Freq: Once | SUBCUTANEOUS | Status: AC
  Administered 2022-01-23: 60 mg via SUBCUTANEOUS

## 2022-01-23 NOTE — Progress Notes (Signed)
Pt presented today for Prolia injection. Right arm, SQ. Pt voiced no concerns nor showed any signs of distress during injection.

## 2022-03-05 ENCOUNTER — Other Ambulatory Visit: Payer: Self-pay | Admitting: Cardiovascular Disease

## 2022-03-07 ENCOUNTER — Telehealth: Payer: Self-pay | Admitting: Internal Medicine

## 2022-03-07 NOTE — Telephone Encounter (Signed)
Husband is calling for pt requesting a provider switch from Dr. Lovena Le to Dr. Caryl Comes due to wanting to be seen in [place] and the patient previously seeing him once before in 2017. Please advise.

## 2022-03-08 ENCOUNTER — Ambulatory Visit: Payer: Medicare HMO | Attending: Otolaryngology | Admitting: Speech Pathology

## 2022-03-08 DIAGNOSIS — J3801 Paralysis of vocal cords and larynx, unilateral: Secondary | ICD-10-CM

## 2022-03-08 DIAGNOSIS — R490 Dysphonia: Secondary | ICD-10-CM | POA: Diagnosis not present

## 2022-03-08 NOTE — Therapy (Addendum)
OUTPATIENT SPEECH LANGUAGE PATHOLOGY VOICE EVALUATION   Patient Name: [name] MRN: [id number] DOB:08-01-1940, 82 y.o., female Today's Date: 03/08/2022  PCP: Carollee Leitz, MD REFERRING PROVIDER: Clyde Canterbury, MD   End of Session - 03/08/22 [phone]     Visit Number 1    Number of Visits 1        Authorization Type [employer] Medicare HMO/PPO        SLP Start Time 0945    SLP Stop Time  1045    SLP Time Calculation (min) 60 min    Activity Tolerance Patient tolerated treatment well             Past Medical History:  Diagnosis Date   Anxiety    Arthritis    CAD (coronary artery disease)    a. cardiac cath 10/2014: ostial LAD 30%, prox LAD 90% s/p PCI/DES, ostial RCA 30%, mid RCA 90% s/p PCI/DES, ostial Ramus 50%   Chronic back pain    Chronic fatigue    Chronic systolic CHF (congestive heart failure) ([place])    a. 10/2014 Echo: EF 20%; b. 06/2017 Echo: EF 20-25%, diff HK, Gr2 DD, Mild AI. Mod to sev MR. Mod dil LA.   Hepatitis C    Ischemic cardiomyopathy    a. 10/2014 Echo: EF 20%; b. 06/2017 Echo: EF 20-25%.   LBBB (left bundle branch block)    Moderate to Severe Mitral regurgitation    a. 06/2017 Echo: Mod-Sev MR.   Osteoporosis    UTI (lower urinary tract infection)    Past Surgical History:  Procedure Laterality Date   BACK SURGERY  07/14   T10, L1   BIV PACEMAKER INSERTION CRT-P N/A 01/02/2022   Procedure: BIV PACEMAKER INSERTION CRT-P;  Surgeon: Evans Lance, MD;  Location: [facility];  Service: Cardiovascular;  Laterality: N/A;   CARDIAC CATHETERIZATION Bilateral 10/27/2014   Procedure: Left Heart Cath and Coronary Angiography;  Surgeon: Minna Merritts, MD;  Location: [facility];  Service: Cardiovascular;  Laterality: Bilateral;   CARDIAC CATHETERIZATION N/A 10/27/2014   Procedure: Coronary Stent Intervention;  Surgeon: Wellington Hampshire, MD;  Location: [facility];  Service: Cardiovascular;  Laterality: N/A;   CORONARY STENT INTERVENTION  N/A 06/13/2021   Procedure: CORONARY STENT INTERVENTION;  Surgeon: Nelva Bush, MD;  Location: [facility];  Service: Cardiovascular;  Laterality: N/A;   HIP ARTHROPLASTY Left 06/07/2021   Procedure: ARTHROPLASTY BIPOLAR HIP (HEMIARTHROPLASTY);  Surgeon: Leim Fabry, MD;  Location: [facility];  Service: Orthopedics;  Laterality: Left;   INTRAVASCULAR PRESSURE WIRE/FFR STUDY N/A 06/13/2021   Procedure: INTRAVASCULAR PRESSURE WIRE/FFR STUDY;  Surgeon: Nelva Bush, MD;  Location: [facility];  Service: Cardiovascular;  Laterality: N/A;   RIGHT/LEFT HEART CATH AND CORONARY ANGIOGRAPHY N/A 06/13/2021   Procedure: RIGHT/LEFT HEART CATH AND CORONARY ANGIOGRAPHY;  Surgeon: Nelva Bush, MD;  Location: [facility];  Service: Cardiovascular;  Laterality: N/A;   Patient Active Problem List   Diagnosis Date Noted   Acute exacerbation of CHF (congestive heart failure) ([place]) 12/31/2021   Bilateral pleural effusion 12/31/2021   Elevated brain natriuretic peptide (BNP) level 12/31/2021   Elevated d-dimer 12/31/2021   Prolonged QT interval 12/31/2021   Elevated lipase 12/31/2021   Elevated MCV 12/31/2021   Iron deficiency anemia 12/31/2021   At high risk for falls 12/13/2021   Physical deconditioning 12/13/2021   Aortic atherosclerosis ([place]) 11/26/2021   Acute kidney injury superimposed on chronic kidney disease ([place])  06/13/2021   Hypokalemia 06/12/2021   Constipation 06/12/2021   Non-ST elevation (NSTEMI) myocardial infarction [facility]) 06/06/2021   Fall    Elevated troponin    Closed left hip fracture ([place]) 06/05/2021   Abnormal gait 03/01/2021   Cervicalgia 07/13/2020   Annual physical exam 06/27/2020   Stage 3b chronic kidney disease ([place]) 07/08/2019   Prediabetes 07/08/2019   Rhinitis 05/14/2017   COPD (chronic obstructive pulmonary disease) ([place]) 05/14/2017   Dizziness 05/14/2017   Dysphagia 03/27/2017   Anxiety 03/17/2017   Runny nose 05/20/2016   Acute on  chronic systolic CHF (congestive heart failure) ([place]) 05/19/2016   Finger injury 04/19/2016   Mixed hyperlipidemia 03/24/2015   CAD (coronary artery disease)    Chronic fatigue    Chronic back pain    History of coronary artery stent placement    Acute respiratory failure with hypoxia ([place]) 10/25/2014   S/P repair of vertebral fracture 02/22/2014   History of hepatitis C 11/17/2013   Essential hypertension 11/17/2013   Osteoporosis 11/19/2012    Onset date: 06/2021; date of referral 11/26/2021  REFERRING DIAG: Vocal fold paralysis, unilateral  THERAPY DIAG:  Dysphonia  Vocal fold paralysis, unilateral  Rationale for Evaluation and Treatment Rehabilitation  SUBJECTIVE:   SUBJECTIVE STATEMENT: Pt pleasant, accompanied by her husband, pt became out of breath walking to SLP office Pt accompanied by: significant other  PERTINENT HISTORY: Pt is an 82 year old female who reported raspy voice following intubated for hip surgery in April 2023. Pt referred by ENT Dr Clyde Canterbury for left vocal cord paralysis.   DIAGNOSTIC FINDINGS:  11/26/2021 Laryngoscopy  Left vocal cord paralysis  06/08/2021 CT Head Chronic atrophic and ischemic changes without acute abnormality.   01/03/2022 Chest x-ray Bibasilar atelectasis or edema is noted with associated pleural effusions.      PAIN:  Are you having pain? No   FALLS: Has patient fallen in last 6 months? No, Number of falls: N/A  LIVING ENVIRONMENT: Lives with: lives with their spouse Lives in: House/apartment    PATIENT GOALS to be able to speak again  OBJECTIVE:   COGNITION: Overall cognitive status: Within functional limits for tasks assessed  SOCIAL HISTORY: Occupation: retired Building control surveyor intake: suboptimal d/t cardiology limitations - "I am always thirsty" Caffeine/alcohol intake: N/A Daily voice use: moderate Environmental risks: None reported Occupational risks: None identified Misuse: Speaks excessively and None  observed or reported Phonotraumatic behaviors: Other:  PERCEPTUAL VOICE ASSESSMENT: Voice quality: aphonic Vocal abuse: abnormal breathing pattern d/t running out of air support from aphonia Resonance: normal Respiratory function:  poorly supported d/t aphonia  OBJECTIVE VOICE ASSESSMENT: Attempts were made to objectively measure pt's phonation but equipment was unable to analyze d/t severity of pt's aphonia.      ORAL MOTOR EXAMINATION Facial : WFL Lingual: WFL Velum: WFL Mandible: WFL Cough: WFL Voice: Other: aphonic    PATIENT REPORTED OUTCOME MEASURES (PROM):  VOICE HANDICAP INDEX (VHI)  The Voice Handicap Index is comprised of a series of questions to assess the patient's perception of their voice. It is designed to evaluate the emotional, physical and functional components of the voice problem.  SEVERE impact    PATIENT EDUCATION: Education details: Given severity of pt's aphonia, her prognosis for recovery with ST services alone are very poor. Discussed recommendation that pt see a specialist as Dr Richardson Landry mentioned in his evaluation. Person educated: Patient and Spouse Education method: Explanation, Verbal cues, and Handouts Education comprehension: verbalized understanding    ASSESSMENT:  CLINICAL  IMPRESSION: Patient is a 82 y.o. female who was seen today for a Voice Evaluation. Pt presents with significant aphonia d/t left vocal cord paralysis. Given the severity, it is my clinical opinion that pt would benefit from a referral to otolaryngologist for possible augmentation prior to engaging in [facility] services. I communicated this to Dr Richardson Landry (pt's ENT) who replied that he would send a referral. Please re-consult ST services if they are needed after such appt.    Evelean Bigler B. Rutherford Nail, M.S., CCC-SLP, Mining engineer Certified Brain Injury [place]  [place] Office [phone] Ascom  [phone] Fax [phone]

## 2022-03-12 ENCOUNTER — Encounter: Payer: Medicare HMO | Admitting: Speech Pathology

## 2022-03-13 NOTE — Telephone Encounter (Signed)
Fine with me. GT

## 2022-03-14 ENCOUNTER — Telehealth: Payer: Self-pay | Admitting: Speech Pathology

## 2022-03-15 ENCOUNTER — Encounter: Payer: Medicare HMO | Admitting: Speech Pathology

## 2022-03-18 ENCOUNTER — Encounter: Payer: Medicare HMO | Admitting: Speech Pathology

## 2022-03-20 ENCOUNTER — Encounter: Payer: Medicare HMO | Admitting: Speech Pathology

## 2022-03-21 NOTE — Telephone Encounter (Signed)
And me Thanks SK

## 2022-03-22 NOTE — Telephone Encounter (Addendum)
I called and spoke with [name] and [name]. I provided that Kelly ENT (Dr Richardson Landry) was agreeable to sending referral to otolaryngologist.  All questions answered to their satisfaction.   Raeleen Winstanley B. Rutherford Nail, M.S., CCC-SLP, Mining engineer Certified Brain Injury [place] [facility]  [place] Office [phone] Ascom [phone] Fax [phone]

## 2022-03-24 NOTE — Progress Notes (Signed)
Date:  03/25/2022   ID:  [name], DOB 1940-06-28, MRN [id number]  Patient Location:  [address] [place] [address]   Provider location:   [facility], [place] office  PCP:  Carollee Leitz, MD  Cardiologist:  Arvid Right [facility]  Chief Complaint  Patient presents with   Follow up s/p pacemaker implant.     "Doing well." Medications reviewed by the patient verbally.     History of Present Illness:    [name] is a 82 y.o. female  past medical history of hepatitis C,  osteoporosis,  arthritis,  chronic back pain s/p multiple fusions,  chronic UTI's,  CAD, ischemic cardiomyopathy with ejection fraction 25% on echocardiogram Mitral valve: There was moderate to severe regurgitation November 2016, PCI to the mid RCA,  proximal LAD August 2016,  Significant medication intolerances Previously not interested in defibrillator or repeat echocardiogram EF in 06/2017, 20 to 25% LBBB PCI of LAD prox and mid 06/2021 who presents for routine follow-up of her cardiomyopathy, CAD  Last seen by myself in clinic September 2023  Admitted to [facility]  10/23 with acute CHF.   BNP greater than 4500.   Echo showed EF less than 20%, LV severely dilated plus severe MR.  Mitral valve appeared rheumatic or " postinflammatory" tissue.  RVSP moderately elevated at 55 mmHg, RV mildly reduced, mild to moderate AI and mild to moderate TR.    diuresed with IV Lasix and underwent Saint Jude CRT-P for left bundle/AV block and chronic systolic heart failure.  A pacemaker was placed due to patient's preference.    Guideline directed medical therapy was limited by hypotension requiring midodrine.  Decision made to not start SGLT2 inhibitor given risk for GU infection (prior history of UTIs) and new pacemaker.    seen by advanced heart failure team 01/16/2022.  on midodrine 2.5 mg 3 times daily.  Midodrine was stopped due to SBPs in the 130s.   on Toprol  daily.  She is  requesting no further medication changes   MitraClip was previously discussed, however patient would not want any further procedures/interventions.  Declining repeat echocardiogram on today's visit  BNP came back >4500 and torsemide was increased to 40mg  daily.   In follow-up today, reports she is Only taking torsemide 20 mg daily Torsemide 40 mg daily makes her constipated Belly feels ok, denies leg edema Minimally active at home, walks from chairs to kitchen, does not go outside the house very much Weight at home 114 actually down 5 pounds from November 2023  Continues to have hoarse voice, started early 2023 following hip surgery  EKG personally reviewed by myself on todays visit Paced rhythm rate 83 bpm  The past medical history reviewed Echocardiogram January 01, 2022 EF estimated less than 20%, mildly decreased RV function with right ventricular systolic pressure estimated 55 mmHg, severely dilated left atrium  Reports that she had a fall April 2023, suffered injury to her hip Hip surgery April 2023,  Cardiac cath with PCI x2 Successful PCI to proximal LAD using Onyx Frontier 3.0 x 12 mm drug-eluting stent (jailing D1) with 0% residual stenosis and TIMI-3 flow. Successful PCI to mid LAD using Onyx Frontier 2.5 x 15 mm drug-eluting stent (jailing D2) with angioplasty of ostium of D2.  There is 0% residual stenosis in the LAD and 80% residual stenosis at the ostium of D2 with TIMI-3 flow.  Echo 4/23   1. Left ventricular ejection fraction,  by estimation, is 25 to 30%. The  left ventricle has severely decreased function. The left ventricle  demonstrates global hypokinesis. The left ventricular internal cavity size  was mildly dilated. There is moderate  asymmetric left ventricular hypertrophy of the septal segment. Left  ventricular diastolic parameters are consistent with Grade II diastolic  dysfunction (pseudonormalization).   2. Right ventricular systolic function is low  normal. The right  ventricular size is mildly enlarged.   3. Left atrial size was moderately dilated.   4. The mitral valve is normal in structure. Moderate mitral valve  regurgitation.   5. The aortic valve is tricuspid. Aortic valve regurgitation is trivial.   Seen in urgent care November 11, 2021 increasing "shortness of breath" since hip surgery April 2023.  She denies having shortness of breath, reports it was hoarseness It was recommended she go to the emergency room She refused follow-up in the emergency room   cardiac cath on 8/25 that showed ostial LAD 30%, prox LAD 90%, ostial RCA 30%, mid RCA 90%, ostial Ramus 50%.  She underwent successful PCI/DES to the proximal LAD and mid RCA with Xience DES. She was started on aspirin and Brilinta.    she could not tolerated Brilinta 2/2 SOB. She was changed to Plavix.      Past Medical History:  Diagnosis Date   Anxiety    Arthritis    CAD (coronary artery disease)    a. cardiac cath 10/2014: ostial LAD 30%, prox LAD 90% s/p PCI/DES, ostial RCA 30%, mid RCA 90% s/p PCI/DES, ostial Ramus 50%   Chronic back pain    Chronic fatigue    Chronic systolic CHF (congestive heart failure) ([place])    a. 10/2014 Echo: EF 20%; b. 06/2017 Echo: EF 20-25%, diff HK, Gr2 DD, Mild AI. Mod to sev MR. Mod dil LA.   Hepatitis C    Ischemic cardiomyopathy    a. 10/2014 Echo: EF 20%; b. 06/2017 Echo: EF 20-25%.   LBBB (left bundle branch block)    Moderate to Severe Mitral regurgitation    a. 06/2017 Echo: Mod-Sev MR.   Osteoporosis    UTI (lower urinary tract infection)    Past Surgical History:  Procedure Laterality Date   BACK SURGERY  07/14   T10, L1   BIV PACEMAKER INSERTION CRT-P N/A 01/02/2022   Procedure: BIV PACEMAKER INSERTION CRT-P;  Surgeon: Evans Lance, MD;  Location: [place] [facility];  Service: Cardiovascular;  Laterality: N/A;   CARDIAC CATHETERIZATION Bilateral 10/27/2014   Procedure: Left Heart Cath and Coronary Angiography;   Surgeon: Minna Merritts, MD;  Location: [place] [facility];  Service: Cardiovascular;  Laterality: Bilateral;   CARDIAC CATHETERIZATION N/A 10/27/2014   Procedure: Coronary Stent Intervention;  Surgeon: Wellington Hampshire, MD;  Location: [place] [facility];  Service: Cardiovascular;  Laterality: N/A;   CORONARY STENT INTERVENTION N/A 06/13/2021   Procedure: CORONARY STENT INTERVENTION;  Surgeon: Nelva Bush, MD;  Location: [place] [facility];  Service: Cardiovascular;  Laterality: N/A;   HIP ARTHROPLASTY Left 06/07/2021   Procedure: ARTHROPLASTY BIPOLAR HIP (HEMIARTHROPLASTY);  Surgeon: Leim Fabry, MD;  Location: [facility];  Service: Orthopedics;  Laterality: Left;   INTRAVASCULAR PRESSURE WIRE/FFR STUDY N/A 06/13/2021   Procedure: INTRAVASCULAR PRESSURE WIRE/FFR STUDY;  Surgeon: Nelva Bush, MD;  Location: [place] [facility];  Service: Cardiovascular;  Laterality: N/A;   RIGHT/LEFT HEART CATH AND CORONARY ANGIOGRAPHY N/A 06/13/2021   Procedure: RIGHT/LEFT HEART CATH AND CORONARY ANGIOGRAPHY;  Surgeon: End,  Harrell Gave, MD;  Location: [place] [facility];  Service: Cardiovascular;  Laterality: N/A;     Current Meds  Medication Sig   ALPRAZolam (XANAX) 0.25 MG tablet TAKE ONE TABLET BY MOUTH ONCE TO TWICE DAILY AS NEEDED (Patient taking differently: Take 0.25 mg by mouth 2 (two) times daily as needed for anxiety.)   aspirin 81 MG chewable tablet Chew 1 tablet (81 mg total) by mouth daily.   citalopram (CELEXA) 10 MG tablet Take 1 tablet (10 mg total) by mouth daily.   clopidogrel (PLAVIX) 75 MG tablet TAKE ONE TABLET BY MOUTH ONE TIME DAILY   denosumab (PROLIA) 60 MG/ML SOSY injection Inject 60 mg into the skin every 6 (six) months.   ipratropium (ATROVENT) 0.06 % nasal spray Place 2 sprays into both nostrils 4 (four) times daily.   metoprolol succinate (TOPROL-XL) 25 MG 24 hr tablet TAKE ONE-HALF TABLET BY MOUTH ONE TIME DAILY   ondansetron (ZOFRAN-ODT) 4 MG disintegrating tablet  TAKE 1 TABLET BY MOUTH TWICE A DAY AS NEEDED FOR NAUSEA AND VOMITING appt further refills no exceptions   oxyCODONE (OXY IR/ROXICODONE) 5 MG immediate release tablet Take 1 tablet (5 mg total) by mouth 2 (two) times daily as needed for moderate pain (pain score 4-6).   rosuvastatin (CRESTOR) 40 MG tablet TAKE ONE TABLET BY MOUTH ONE TIME DAILY AT 6PM   torsemide (DEMADEX) 20 MG tablet Take 2 tablets (40 mg total) by mouth daily.     Allergies:   Sugar-protein-starch   Social History   Tobacco Use   Smoking status: Former    Packs/day: 1.00    Years: 10.00    Total pack years: 10.00    Types: Cigarettes   Smokeless tobacco: Never  Vaping Use   Vaping Use: Never used  Substance Use Topics   Alcohol use: No    Alcohol/week: 0.0 standard drinks of alcohol   Drug use: No      Family Hx: The patient's family history includes Arthritis in her mother; Diabetes in her father; Heart disease in her father.  ROS:   Please see the history of present illness.    Review of Systems  Constitutional: Negative.   HENT: Negative.    Respiratory: Negative.    Cardiovascular: Negative.   Gastrointestinal: Negative.   Musculoskeletal: Negative.        Leg weakness  Neurological: Negative.   Psychiatric/Behavioral: Negative.    All other systems reviewed and are negative.   Labs/Other Tests and Data Reviewed:    Recent Labs: 09/26/2021: TSH 1.37 01/01/2022: ALT 29; Hemoglobin 14.3; Platelets 174 01/03/2022: Magnesium 2.1 01/16/2022: B Natriuretic Peptide >4,500.0; BUN 21; Creatinine, Ser 1.21; Potassium 4.2; Sodium 142   Recent Lipid Panel Lab Results  Component Value Date/Time   CHOL 101 01/02/2022 01:10 AM   CHOL 176 05/08/2021 03:25 PM   TRIG 93 01/02/2022 01:10 AM   HDL 40 (L) 01/02/2022 01:10 AM   HDL 48 05/08/2021 03:25 PM   CHOLHDL 2.5 01/02/2022 01:10 AM   LDLCALC 42 01/02/2022 01:10 AM   LDLCALC 81 05/08/2021 03:25 PM   LDLDIRECT 52.0 01/05/2019 10:52 AM    Wt Readings  from Last 3 Encounters:  03/25/22 115 lb 6 oz (52.3 kg)  01/17/22 120 lb (54.4 kg)  01/16/22 121 lb 12.8 oz (55.2 kg)     Exam:    Vital Signs: Vital signs may also be detailed in the HPI BP 100/70 (BP Location: Left Arm, Patient Position: Sitting, Cuff Size: Normal)  Pulse 83   Ht 5' (1.524 m)   Wt 115 lb 6 oz (52.3 kg)   SpO2 97%   BMI 22.53 kg/m   Constitutional:  oriented to person, place, and time. No distress.  HENT:  Head: Grossly normal Eyes:  no discharge. No scleral icterus.  Neck: No JVD, no carotid bruits  Cardiovascular: Regular rate and rhythm, no murmurs appreciated Pulmonary/Chest: Clear to auscultation bilaterally, no wheezes or rails Abdominal: Soft.  no distension.  no tenderness.  Musculoskeletal: Normal range of motion Neurological:  normal muscle tone. Coordination normal. No atrophy Skin: Skin warm and dry Psychiatric: normal affect, pleasant   ASSESSMENT & PLAN:    Coronary artery disease of native artery of native heart with stable angina pectoris (HCC) -  Currently with no symptoms of angina. No further workup at this time. Continue current medication regimen. History of stent x2 to proximal and mid LAD on aspirin Plavix statin beta-blocker  Ischemic cardiomyopathy -chronic systolic CHF EF 71% Previously declined ICD, has pacemaker Long history of medication reluctance Willing to continue on her metoprolol succinate, torsemide 20 daily Off midodrine as blood pressure was stable, Blood pressure low today but no orthostasis though she is minimally active SGLT2 inhibitor previously not initiated secondary to concern for UTIs  Mixed hyperlipidemia -  Continue Crestor 40 daily Cholesterol at goal  Essential hypertension -  Blood pressure often running low, denies symptoms, off midodrine  Debility Legs weak, unable to walk very far, husband primary caretaker History of fall April 2023 with trauma to the hip requiring surgery Hoarseness since  that time  Anxiety -  Managed by primary care  Pacer Has pacer download February, follow-up with EP mid February   Total encounter time more than 30 minutes  Greater than 50% was spent in counseling and coordination of care with the patient    Signed, Ida Rogue, MD  03/25/2022 1:57 PM    [place] Office [address], [place], [place] [address]

## 2022-03-25 ENCOUNTER — Ambulatory Visit: Payer: Medicare HMO | Attending: Cardiovascular Disease | Admitting: Cardiovascular Disease

## 2022-03-25 ENCOUNTER — Encounter: Payer: Self-pay | Admitting: Cardiovascular Disease

## 2022-03-25 VITALS — BP 100/70 | HR 83 | Ht 60.0 in | Wt 115.4 lb

## 2022-03-25 DIAGNOSIS — I2510 Atherosclerotic heart disease of native coronary artery without angina pectoris: Secondary | ICD-10-CM | POA: Diagnosis not present

## 2022-03-25 DIAGNOSIS — E785 Hyperlipidemia, unspecified: Secondary | ICD-10-CM | POA: Diagnosis not present

## 2022-03-25 DIAGNOSIS — I5022 Chronic systolic (congestive) heart failure: Secondary | ICD-10-CM | POA: Diagnosis not present

## 2022-03-25 DIAGNOSIS — J449 Chronic obstructive pulmonary disease, unspecified: Secondary | ICD-10-CM | POA: Diagnosis not present

## 2022-03-25 DIAGNOSIS — I700 Atherosclerosis of aorta: Secondary | ICD-10-CM | POA: Diagnosis not present

## 2022-03-25 DIAGNOSIS — I340 Nonrheumatic mitral (valve) insufficiency: Secondary | ICD-10-CM

## 2022-03-25 DIAGNOSIS — I255 Ischemic cardiomyopathy: Secondary | ICD-10-CM

## 2022-03-25 DIAGNOSIS — I5042 Chronic combined systolic (congestive) and diastolic (congestive) heart failure: Secondary | ICD-10-CM | POA: Diagnosis not present

## 2022-03-25 DIAGNOSIS — I447 Left bundle-branch block, unspecified: Secondary | ICD-10-CM

## 2022-03-25 MED ORDER — ROSUVASTATIN CALCIUM 40 MG PO TABS: 40.0000 mg | ORAL_TABLET | Freq: Every day | ORAL | 1 refills | Status: DC

## 2022-03-25 NOTE — Patient Instructions (Signed)
Medication Instructions:  ?No changes ? ?If you need a refill on your cardiac medications before your next appointment, please call your pharmacy.  ? ?Lab work: ?No new labs needed ? ?Testing/Procedures: ?No new testing needed ? ?Follow-Up: ?At [facility], you and your health needs are our priority.  As part of our continuing mission to provide you with exceptional heart care, we have created designated Provider Care Teams.  These Care Teams include your primary Cardiologist (physician) and Advanced Practice Providers (APPs -  Physician Assistants and Nurse Practitioners) who all work together to provide you with the care you need, when you need it. ? ?You will need a follow up appointment in 6 months ? ?Providers on your designated Care Team:   ?Christopher Berge, NP ?Ryan Dunn, PA-C ?Cadence Furth, PA-C ? ?COVID-19 Vaccine Information can be found at: [email or web address] For questions related to vaccine distribution or appointments, please email [email or web address] or call [phone].  ? ?

## 2022-03-26 ENCOUNTER — Encounter: Payer: Medicare HMO | Admitting: Speech Pathology

## 2022-03-27 ENCOUNTER — Encounter: Payer: Medicare HMO | Admitting: Family Medicine

## 2022-03-28 ENCOUNTER — Encounter: Payer: Medicare HMO | Admitting: Speech Pathology

## 2022-04-02 ENCOUNTER — Encounter: Payer: Medicare HMO | Admitting: Speech Pathology

## 2022-04-04 ENCOUNTER — Encounter: Payer: Medicare HMO | Admitting: Speech Pathology

## 2022-04-04 ENCOUNTER — Ambulatory Visit (INDEPENDENT_AMBULATORY_CARE_PROVIDER_SITE_OTHER): Payer: Medicare HMO

## 2022-04-04 DIAGNOSIS — I255 Ischemic cardiomyopathy: Secondary | ICD-10-CM | POA: Diagnosis not present

## 2022-04-04 LAB — CUP PACEART REMOTE DEVICE CHECK
Battery Remaining Longevity: 43 mo
Battery Remaining Percentage: 95.5 %
Battery Voltage: 2.98 V
Brady Statistic AP VP Percent: 1.1 %
Brady Statistic AP VS Percent: 1 %
Brady Statistic AS VP Percent: 94 %
Brady Statistic AS VS Percent: 1.9 %
Brady Statistic RA Percent Paced: 1.2 %
Date Time Interrogation Session: 20240201020010
Implantable Lead Connection Status: 753985
Implantable Lead Connection Status: 753985
Implantable Lead Connection Status: 753985
Implantable Lead Implant Date: 20231101
Implantable Lead Implant Date: 20231101
Implantable Lead Implant Date: 20231101
Implantable Lead Location: 753858
Implantable Lead Location: 753859
Implantable Lead Location: 753860
Implantable Pulse Generator Implant Date: 20231101
Lead Channel Impedance Value: 310 Ohm
Lead Channel Impedance Value: 390 Ohm
Lead Channel Impedance Value: 790 Ohm
Lead Channel Pacing Threshold Amplitude: 0.5 V
Lead Channel Pacing Threshold Amplitude: 0.5 V
Lead Channel Pacing Threshold Amplitude: 1 V
Lead Channel Pacing Threshold Pulse Width: 0.5 ms
Lead Channel Pacing Threshold Pulse Width: 0.5 ms
Lead Channel Pacing Threshold Pulse Width: 0.5 ms
Lead Channel Sensing Intrinsic Amplitude: 3.1 mV
Lead Channel Sensing Intrinsic Amplitude: 7.6 mV
Lead Channel Setting Pacing Amplitude: 3.5 V
Lead Channel Setting Pacing Amplitude: 3.5 V
Lead Channel Setting Pacing Amplitude: 3.5 V
Lead Channel Setting Pacing Pulse Width: 0.5 ms
Lead Channel Setting Pacing Pulse Width: 0.5 ms
Lead Channel Setting Sensing Sensitivity: 2 mV
Pulse Gen Model: 3562
Pulse Gen Serial Number: 812818

## 2022-04-05 DIAGNOSIS — R490 Dysphonia: Secondary | ICD-10-CM | POA: Diagnosis not present

## 2022-04-05 DIAGNOSIS — J3801 Paralysis of vocal cords and larynx, unilateral: Secondary | ICD-10-CM | POA: Diagnosis not present

## 2022-04-08 ENCOUNTER — Encounter: Payer: Medicare HMO | Admitting: Speech Pathology

## 2022-04-09 ENCOUNTER — Encounter: Payer: Medicare HMO | Admitting: Internal Medicine

## 2022-04-11 ENCOUNTER — Encounter: Payer: Medicare HMO | Admitting: Speech Pathology

## 2022-04-16 ENCOUNTER — Encounter: Payer: Medicare HMO | Admitting: Speech Pathology

## 2022-04-18 ENCOUNTER — Encounter: Payer: Medicare HMO | Admitting: Speech Pathology

## 2022-04-22 ENCOUNTER — Telehealth: Payer: Self-pay | Admitting: Speech Pathology

## 2022-04-22 NOTE — Telephone Encounter (Signed)
I received a message that p's husband called and requested that I call them back.     In my initial conversation, pt and her husband provided that they had met with Dr Patrice Paradise (ENT) and they reported details inconsistent with report in chart from Dr Patrice Paradise. As such, they were requesting possible evaluation for use of an electrolarynx in the setting of Unilateral complete vocal fold paralysis L new J38.01.   After reviewing Dr Towanda Malkin note, I called pt and her husband back with the following information contained with the report - "treatment options including a temporizing injection laryngoplasty, permanent laryngoplasty. For the temporary injection I typically use restalyne which lasts 2-3 months on average or sometimes calcium hydroxyapetite that lasts on average 6-12 months. Permanent laryngoplasty is done with the patient awake but sedated because the patient has to talk so that I can determine how to reposition the paralyzed vocal fold. I think she would need medialization procedure bilaterally given the atrophy bilaterally and large glottal gap."  They both voiced understanding of the above recommendations.   Given the above documentation of large glottal gap and documentation by Christal Georga Bora (CMA to Dr Patrice Paradise) that notes "positive for choking," as well as multiple recent chest x-ray concerning for plural effusions, I spoke to pt and her husband about pt's higher risk of aspiration. Clinically, I think would benefit from an instrumental swallow study to assess for aspiration but to also functionally assess glottal closure and increased education to aid in decision making. I reached out to pt's ENT Dr Clyde Canterbury for possible order.   During the third conversation regarding my recommendation of a Modified Barium Swallow Study, they commented that they were opposed to Dr Towanda Malkin above recommendations because he wanted a CT with contrast prior to any intervention. They report that pt is a very hard  stick and given her back pain they aren't interested in a CT with contrast.   At this time, I recommend that they keep pt's appt with Dr Clyde Canterbury on 05/06/2022 at 10am for further decision. Our office has 2 trials electrolarynx that I can certainly evaluate and have pt try should Dr Richardson Landry feel this is an appropriate POC.   Ramatoulaye Pack B. Rutherford Nail, M.S., CCC-SLP, Mining engineer Certified Brain Injury [place]  [place] Office [phone] Ascom [phone] Fax [phone]

## 2022-04-23 ENCOUNTER — Ambulatory Visit: Payer: Medicare HMO | Attending: Internal Medicine | Admitting: Internal Medicine

## 2022-04-23 ENCOUNTER — Encounter: Payer: Medicare HMO | Admitting: Speech Pathology

## 2022-04-23 VITALS — BP 112/60 | HR 78 | Ht 60.0 in | Wt 116.1 lb

## 2022-04-23 DIAGNOSIS — I10 Essential (primary) hypertension: Secondary | ICD-10-CM | POA: Diagnosis not present

## 2022-04-23 DIAGNOSIS — I255 Ischemic cardiomyopathy: Secondary | ICD-10-CM | POA: Diagnosis not present

## 2022-04-23 DIAGNOSIS — I447 Left bundle-branch block, unspecified: Secondary | ICD-10-CM | POA: Diagnosis not present

## 2022-04-23 DIAGNOSIS — E782 Mixed hyperlipidemia: Secondary | ICD-10-CM | POA: Diagnosis not present

## 2022-04-23 DIAGNOSIS — I5042 Chronic combined systolic (congestive) and diastolic (congestive) heart failure: Secondary | ICD-10-CM

## 2022-04-23 LAB — CUP PACEART INCLINIC DEVICE CHECK
Battery Remaining Longevity: 84 mo
Battery Voltage: 2.98 V
Brady Statistic RA Percent Paced: 1.2 %
Brady Statistic RV Percent Paced: 96 %
Date Time Interrogation Session: 20240220141802
Implantable Lead Connection Status: 753985
Implantable Lead Connection Status: 753985
Implantable Lead Connection Status: 753985
Implantable Lead Implant Date: 20231101
Implantable Lead Implant Date: 20231101
Implantable Lead Implant Date: 20231101
Implantable Lead Location: 753858
Implantable Lead Location: 753859
Implantable Lead Location: 753860
Implantable Pulse Generator Implant Date: 20231101
Lead Channel Impedance Value: 312.5 Ohm
Lead Channel Impedance Value: 425 Ohm
Lead Channel Impedance Value: 900 Ohm
Lead Channel Pacing Threshold Amplitude: 0.5 V
Lead Channel Pacing Threshold Amplitude: 0.5 V
Lead Channel Pacing Threshold Amplitude: 0.5 V
Lead Channel Pacing Threshold Amplitude: 0.5 V
Lead Channel Pacing Threshold Amplitude: 1.25 V
Lead Channel Pacing Threshold Amplitude: 1.25 V
Lead Channel Pacing Threshold Pulse Width: 0.5 ms
Lead Channel Pacing Threshold Pulse Width: 0.5 ms
Lead Channel Pacing Threshold Pulse Width: 0.5 ms
Lead Channel Pacing Threshold Pulse Width: 0.5 ms
Lead Channel Pacing Threshold Pulse Width: 0.5 ms
Lead Channel Pacing Threshold Pulse Width: 0.5 ms
Lead Channel Sensing Intrinsic Amplitude: 3.9 mV
Lead Channel Sensing Intrinsic Amplitude: 5 mV
Lead Channel Setting Pacing Amplitude: 2 V
Lead Channel Setting Pacing Amplitude: 2 V
Lead Channel Setting Pacing Amplitude: 2.5 V
Lead Channel Setting Pacing Pulse Width: 0.5 ms
Lead Channel Setting Pacing Pulse Width: 0.5 ms
Lead Channel Setting Sensing Sensitivity: 2 mV
Pulse Gen Model: 3562
Pulse Gen Serial Number: 8124818

## 2022-04-23 LAB — PACEMAKER DEVICE OBSERVATION

## 2022-04-23 NOTE — Patient Instructions (Signed)
Medication Instructions:  - Your physician recommends that you continue on your current medications as directed. Please refer to the Current Medication list given to you today.  *If you need a refill on your cardiac medications before your next appointment, please call your pharmacy*   Lab Work: - none ordered  If you have labs (blood work) drawn today and your tests are completely normal, you will receive your results only by: [place] (if you have MyChart) OR A paper copy in the mail If you have any lab test that is abnormal or we need to change your treatment, we will call you to review the results.   Testing/Procedures: - none ordered   Follow-Up: At [facility], you and your health needs are our priority.  As part of our continuing mission to provide you with exceptional heart care, we have created designated Provider Care Teams.  These Care Teams include your primary Cardiologist (physician) and Advanced Practice Providers (APPs -  Physician Assistants and Nurse Practitioners) who all work together to provide you with the care you need, when you need it.  We recommend signing up for the patient portal called "MyChart".  Sign up information is provided on this After Visit Summary.  MyChart is used to connect with patients for Virtual Visits (Telemedicine).  Patients are able to view lab/test results, encounter notes, upcoming appointments, etc.  Non-urgent messages can be sent to your provider as well.   To learn more about what you can do with MyChart, go to [email or web address].    Your next appointment:   9 month(s)  Provider:   Virl Axe, MD    Other Instructions N/a

## 2022-04-23 NOTE — Progress Notes (Signed)
Patient Care Team: Carollee Leitz, MD as PCP - General (Family Medicine) Rockey Situ Kathlene November, MD as PCP - Cardiology (Cardiology) Evans Lance, MD as PCP - Electrophysiology (Cardiology) Alisa Graff, FNP as Nurse Practitioner (Cardiology) Minna Merritts, MD as Consulting Physician (Cardiology)   HPI  [name] is a 82 y.o. female Seen in followup for CRT-P implanted 11/23 with a left bundle and LV lead system.  Had presented with acute systolic heart failure with an ejection fraction of less than 20%.  She had initially been treated with midodrine.  She now discontinued  Denies shortness of breath or chest pain with activity.   Pain is her biggest limitation  CAD w LAD stenting 3/23 with DAPT recommended x 12 months  Records and Results Reviewed    Past Medical History:  Diagnosis Date   Anxiety    Arthritis    CAD (coronary artery disease)    a. cardiac cath 10/2014: ostial LAD 30%, prox LAD 90% s/p PCI/DES, ostial RCA 30%, mid RCA 90% s/p PCI/DES, ostial Ramus 50%   Chronic back pain    Chronic fatigue    Chronic systolic CHF (congestive heart failure) ([place])    a. 10/2014 Echo: EF 20%; b. 06/2017 Echo: EF 20-25%, diff HK, Gr2 DD, Mild AI. Mod to sev MR. Mod dil LA.   Hepatitis C    Ischemic cardiomyopathy    a. 10/2014 Echo: EF 20%; b. 06/2017 Echo: EF 20-25%.   LBBB (left bundle branch block)    Moderate to Severe Mitral regurgitation    a. 06/2017 Echo: Mod-Sev MR.   Osteoporosis    UTI (lower urinary tract infection)     Past Surgical History:  Procedure Laterality Date   BACK SURGERY  07/14   T10, L1   BIV PACEMAKER INSERTION CRT-P N/A 01/02/2022   Procedure: BIV PACEMAKER INSERTION CRT-P;  Surgeon: Evans Lance, MD;  Location: [place] [facility];  Service: Cardiovascular;  Laterality: N/A;   CARDIAC CATHETERIZATION Bilateral 10/27/2014   Procedure: Left Heart Cath and Coronary Angiography;  Surgeon: Minna Merritts, MD;  Location: [place] [facility];  Service: Cardiovascular;  Laterality: Bilateral;   CARDIAC CATHETERIZATION N/A 10/27/2014   Procedure: Coronary Stent Intervention;  Surgeon: Wellington Hampshire, MD;  Location: [place] [facility];  Service: Cardiovascular;  Laterality: N/A;   CORONARY STENT INTERVENTION N/A 06/13/2021   Procedure: CORONARY STENT INTERVENTION;  Surgeon: Nelva Bush, MD;  Location: [place] [facility];  Service: Cardiovascular;  Laterality: N/A;   HIP ARTHROPLASTY Left 06/07/2021   Procedure: ARTHROPLASTY BIPOLAR HIP (HEMIARTHROPLASTY);  Surgeon: Leim Fabry, MD;  Location: [facility];  Service: Orthopedics;  Laterality: Left;   INTRAVASCULAR PRESSURE WIRE/FFR STUDY N/A 06/13/2021   Procedure: INTRAVASCULAR PRESSURE WIRE/FFR STUDY;  Surgeon: Nelva Bush, MD;  Location: [place] [facility];  Service: Cardiovascular;  Laterality: N/A;   RIGHT/LEFT HEART CATH AND CORONARY ANGIOGRAPHY N/A 06/13/2021   Procedure: RIGHT/LEFT HEART CATH AND CORONARY ANGIOGRAPHY;  Surgeon: Nelva Bush, MD;  Location: [place] [facility];  Service: Cardiovascular;  Laterality: N/A;    Current Meds  Medication Sig   ALPRAZolam (XANAX) 0.25 MG tablet TAKE ONE TABLET BY MOUTH ONCE TO TWICE DAILY AS NEEDED (Patient taking differently: Take 0.25 mg by mouth 2 (two) times daily as needed for anxiety.)   aspirin 81 MG chewable tablet Chew 1 tablet (81 mg total) by mouth daily.   citalopram (CELEXA) 10 MG  tablet Take 1 tablet (10 mg total) by mouth daily.   clopidogrel (PLAVIX) 75 MG tablet TAKE ONE TABLET BY MOUTH ONE TIME DAILY   denosumab (PROLIA) 60 MG/ML SOSY injection Inject 60 mg into the skin every 6 (six) months.   metoprolol succinate (TOPROL-XL) 25 MG 24 hr tablet TAKE ONE-HALF TABLET BY MOUTH ONE TIME DAILY   ondansetron (ZOFRAN-ODT) 4 MG disintegrating tablet TAKE 1 TABLET BY MOUTH TWICE A DAY AS NEEDED FOR NAUSEA AND VOMITING appt further refills no exceptions   oxyCODONE (OXY IR/ROXICODONE) 5 MG  immediate release tablet Take 1 tablet (5 mg total) by mouth 2 (two) times daily as needed for moderate pain (pain score 4-6).   rosuvastatin (CRESTOR) 40 MG tablet Take 1 tablet (40 mg total) by mouth daily.   torsemide (DEMADEX) 20 MG tablet Take 2 tablets (40 mg total) by mouth daily.    Allergies  Allergen Reactions   Sugar-Protein-Starch Nausea And Vomiting    Only to sugar      Review of Systems negative except from HPI and PMH  Physical Exam BP 112/60 (BP Location: Right Arm, Patient Position: Sitting, Cuff Size: Normal)   Pulse 78   Ht 5' (1.524 m)   Wt 116 lb 2 oz (52.7 kg)   SpO2 97%   BMI 22.68 kg/m  Well developed and well nourished in no acute distress HENT normal E scleral and icterus clear Neck Supple JVP flat; carotids brisk and full Clear to ausculation Device pocket well healed; without hematoma or erythema.  There is no tethering Regular rate and rhythm, no murmurs gallops or rub Soft with active bowel sounds No clubbing cyanosis  Edema Alert and oriented, grossly normal motor and sensory function Skin Warm and Dry  ECG sinus with P synchronous pacing Intervals 20/16/44 (preimplant QRSd was 180 ms  CrCl cannot be calculated (Patient's most recent lab result is older than the maximum 21 days allowed.).   Assessment and  Plan  Nonischemic cardiomyopathy    Congestive heart failure  CRT-P-[employer]  Dysphonia   Euvolemic   breathing not limiting, this is largely musculoskeletal   She was not particular interested in [place] reprogramming her device.  Outputs were decreased, but timing issues were not addressed.  Coronary artery disease, without angina.  On DAPT; have reached out to Dr. Bethann Humble to see whether she can go forward on Plavix monotherapy.  Continue her statin.           Current medicines are reviewed at length with the patient today .  The patient does not  have concerns regarding medicines.

## 2022-04-24 ENCOUNTER — Telehealth: Payer: Self-pay | Admitting: Internal Medicine

## 2022-04-24 NOTE — Telephone Encounter (Signed)
The patient was seen in the office yesterday with Dr. Caryl Comes and the discussion was had as to whether she needed to continue with both ASA & Plavix at this time.  Per a Secure Chat message received from Drs. Caryl Comes & End:  Dr. Caryl Comes: chris can this lady come off of ASA at 45 m? 82 yo and frail thx   Dr. Saunders Revel: Are you thing monotherapy with Plavix?   Dr. Caryl Comes: yes - you did her stent LAD a year ago and recommended 12 months, she is coming up on that so I thought I would just try to tie up the loose and   Dr. Saunders Revel: sure. It is fine to stop aspirin and continue indefinite Plavix.   Dr. Caryl Comes: thx - H can we let her know that she can stop her ASA plz and thnx    I have called and spoken with the patient. I have notifed her that based on the above conversation between Dr. Caryl Comes and Dr. Saunders Revel, that she may stop her ASA, but will need to continue her Plavix.   The patient voices understanding and is agreeable.  She was very appreciative of the call.

## 2022-04-25 NOTE — Progress Notes (Signed)
Remote pacemaker transmission.   

## 2022-05-09 ENCOUNTER — Encounter: Payer: Self-pay | Admitting: Internal Medicine

## 2022-05-13 DIAGNOSIS — H52223 Regular astigmatism, bilateral: Secondary | ICD-10-CM | POA: Diagnosis not present

## 2022-05-13 DIAGNOSIS — H2513 Age-related nuclear cataract, bilateral: Secondary | ICD-10-CM | POA: Diagnosis not present

## 2022-05-13 DIAGNOSIS — H524 Presbyopia: Secondary | ICD-10-CM | POA: Diagnosis not present

## 2022-05-13 DIAGNOSIS — H5203 Hypermetropia, bilateral: Secondary | ICD-10-CM | POA: Diagnosis not present

## 2022-05-13 DIAGNOSIS — H35033 Hypertensive retinopathy, bilateral: Secondary | ICD-10-CM | POA: Diagnosis not present

## 2022-05-13 DIAGNOSIS — Z0100 Encounter for examination of eyes and vision without abnormal findings: Secondary | ICD-10-CM | POA: Diagnosis not present

## 2022-06-03 ENCOUNTER — Telehealth: Payer: Self-pay

## 2022-06-04 NOTE — Telephone Encounter (Signed)
I called the patient and informed her that the provider stated she should follow up with ENT for this medication and she understood.  Carlon Davidson,cma

## 2022-06-14 ENCOUNTER — Telehealth: Payer: Self-pay | Admitting: *Deleted

## 2022-06-14 ENCOUNTER — Encounter: Payer: Self-pay | Admitting: *Deleted

## 2022-06-14 NOTE — Telephone Encounter (Signed)
Sent mychart message to schedule & inform of amount due

## 2022-06-14 NOTE — Telephone Encounter (Signed)
$  321 due for Prolia; PA on file-good until 01/26/2023

## 2022-07-04 ENCOUNTER — Ambulatory Visit (INDEPENDENT_AMBULATORY_CARE_PROVIDER_SITE_OTHER): Payer: Medicare HMO

## 2022-07-04 DIAGNOSIS — I255 Ischemic cardiomyopathy: Secondary | ICD-10-CM

## 2022-07-04 LAB — CUP PACEART REMOTE DEVICE CHECK
Battery Remaining Longevity: 83 mo
Battery Remaining Percentage: 93 %
Battery Voltage: 3.01 V
Brady Statistic AP VP Percent: 5.4 %
Brady Statistic AP VS Percent: 1 %
Brady Statistic AS VP Percent: 93 %
Brady Statistic AS VS Percent: 1 %
Brady Statistic RA Percent Paced: 4.3 %
Date Time Interrogation Session: 20240502020011
Implantable Lead Connection Status: 753985
Implantable Lead Connection Status: 753985
Implantable Lead Connection Status: 753985
Implantable Lead Implant Date: 20231101
Implantable Lead Implant Date: 20231101
Implantable Lead Implant Date: 20231101
Implantable Lead Location: 753858
Implantable Lead Location: 753859
Implantable Lead Location: 753860
Implantable Pulse Generator Implant Date: 20231101
Lead Channel Impedance Value: 300 Ohm
Lead Channel Impedance Value: 430 Ohm
Lead Channel Impedance Value: 860 Ohm
Lead Channel Pacing Threshold Amplitude: 0.5 V
Lead Channel Pacing Threshold Amplitude: 0.5 V
Lead Channel Pacing Threshold Amplitude: 1.25 V
Lead Channel Pacing Threshold Pulse Width: 0.5 ms
Lead Channel Pacing Threshold Pulse Width: 0.5 ms
Lead Channel Pacing Threshold Pulse Width: 0.5 ms
Lead Channel Sensing Intrinsic Amplitude: 12 mV
Lead Channel Sensing Intrinsic Amplitude: 2.7 mV
Lead Channel Setting Pacing Amplitude: 2 V
Lead Channel Setting Pacing Amplitude: 2 V
Lead Channel Setting Pacing Amplitude: 2.5 V
Lead Channel Setting Pacing Pulse Width: 0.5 ms
Lead Channel Setting Pacing Pulse Width: 0.5 ms
Lead Channel Setting Sensing Sensitivity: 2 mV
Pulse Gen Model: 3562
Pulse Gen Serial Number: 8124818

## 2022-07-04 NOTE — Telephone Encounter (Signed)
Tried to reach pt by phone, not able to leave a voicemail.  Pt has a TOC appt on 07/16/22 with Dr. Clent Ridges,   PT is now overdue for Prolia. Amount due is $321 (PA on file)  Will also mail unread mychart message

## 2022-07-04 NOTE — Telephone Encounter (Signed)
Patient called office back. Note was read and she is aware if she chooses to get the prolia injection she can have it at her TOC. Patient was advised the cost of $321 and it is due at time of check in.

## 2022-07-05 NOTE — Telephone Encounter (Signed)
Noted, thanks!

## 2022-07-16 ENCOUNTER — Ambulatory Visit (INDEPENDENT_AMBULATORY_CARE_PROVIDER_SITE_OTHER): Payer: Medicare HMO | Admitting: Family Medicine

## 2022-07-16 ENCOUNTER — Encounter: Payer: Self-pay | Admitting: Family Medicine

## 2022-07-16 VITALS — BP 122/78 | HR 80 | Ht 60.0 in | Wt 120.0 lb

## 2022-07-16 DIAGNOSIS — J310 Chronic rhinitis: Secondary | ICD-10-CM | POA: Diagnosis not present

## 2022-07-16 DIAGNOSIS — F1190 Opioid use, unspecified, uncomplicated: Secondary | ICD-10-CM

## 2022-07-16 DIAGNOSIS — I10 Essential (primary) hypertension: Secondary | ICD-10-CM | POA: Diagnosis not present

## 2022-07-16 DIAGNOSIS — Z23 Encounter for immunization: Secondary | ICD-10-CM | POA: Diagnosis not present

## 2022-07-16 DIAGNOSIS — R7303 Prediabetes: Secondary | ICD-10-CM | POA: Diagnosis not present

## 2022-07-16 DIAGNOSIS — N1832 Chronic kidney disease, stage 3b: Secondary | ICD-10-CM

## 2022-07-16 DIAGNOSIS — E611 Iron deficiency: Secondary | ICD-10-CM

## 2022-07-16 DIAGNOSIS — I700 Atherosclerosis of aorta: Secondary | ICD-10-CM | POA: Diagnosis not present

## 2022-07-16 DIAGNOSIS — I5020 Unspecified systolic (congestive) heart failure: Secondary | ICD-10-CM

## 2022-07-16 DIAGNOSIS — M810 Age-related osteoporosis without current pathological fracture: Secondary | ICD-10-CM | POA: Diagnosis not present

## 2022-07-16 DIAGNOSIS — I25118 Atherosclerotic heart disease of native coronary artery with other forms of angina pectoris: Secondary | ICD-10-CM

## 2022-07-16 DIAGNOSIS — F39 Unspecified mood [affective] disorder: Secondary | ICD-10-CM

## 2022-07-16 DIAGNOSIS — G8929 Other chronic pain: Secondary | ICD-10-CM

## 2022-07-16 DIAGNOSIS — M546 Pain in thoracic spine: Secondary | ICD-10-CM | POA: Diagnosis not present

## 2022-07-16 MED ORDER — FEXOFENADINE HCL 180 MG PO TABS
180.0000 mg | ORAL_TABLET | Freq: Every day | ORAL | 2 refills | Status: DC
Start: 2022-07-16 — End: 2022-07-24

## 2022-07-16 MED ORDER — DENOSUMAB 60 MG/ML ~~LOC~~ SOSY
60.0000 mg | PREFILLED_SYRINGE | Freq: Once | SUBCUTANEOUS | Status: AC
  Administered 2022-07-16: 60 mg via SUBCUTANEOUS

## 2022-07-16 MED ORDER — OXYCODONE HCL 5 MG PO TABS
5.0000 mg | ORAL_TABLET | Freq: Every day | ORAL | 0 refills | Status: DC | PRN
Start: 2022-07-16 — End: 2023-05-16

## 2022-07-16 MED ORDER — LEVOCETIRIZINE DIHYDROCHLORIDE 5 MG PO TABS
5.0000 mg | ORAL_TABLET | Freq: Every evening | ORAL | 1 refills | Status: DC
Start: 2022-07-16 — End: 2022-07-24

## 2022-07-16 NOTE — Progress Notes (Signed)
SUBJECTIVE:   Chief Complaint  Patient presents with   Transfer of Care   Environmental Allergies    Needs oral medication recommendations, cannot take nasal sprays   Medication Management    Refill oxycodone   HPI Presents to clinic to transfer care.   Osteoporosis Recent DEXA scan showed T-score -4.8.  Has been on Prolia injections since February 2016. Prolia injection due today.  No recent falls or fractures.   Hypertension Asymptomatic.  Current medications.  Takes metoprolol XL 12.5 Daily, torsemide 20 mg daily and tolerating well.  She follows with cardiology.  Hyperlipidemia On statin therapy and tolerating well.  Takes Crestor 40 mg daily  HFrEF Asymptomatic.  Doing well on current medication.  Takes Demadex 20 mg daily and metoprolol XL 12.5 mg daily.  Follows with cardiology.  CAD status post PCI with stent placement Asymptomatic.  On Plavix 75 mg daily and tolerating well.  No signs of bleeding.  Chronic back pain Requesting refill oxycodone 5 mg.  Takes 1 tablet weekly if needed for severe pain.  Mood disorder Reports takes Xanax 0.25 mg 1-2 times a month as needed for elevated anxiety.  Has been taking for years.  Not requesting refills today.    PERTINENT PMH / PSH: Hypertension CAD HFrEF   OBJECTIVE:  BP 122/78   Pulse 80   Ht 5' (1.524 m)   Wt 120 lb (54.4 kg)   SpO2 95%   BMI 23.44 kg/m    Physical Exam Vitals reviewed.  Constitutional:      General: She is not in acute distress.    Appearance: Normal appearance. She is normal weight. She is not ill-appearing.  HENT:     Head: Normocephalic.  Eyes:     Conjunctiva/sclera: Conjunctivae normal.  Neck:     Thyroid: No thyromegaly or thyroid tenderness.  Cardiovascular:     Rate and Rhythm: Normal rate and regular rhythm.     Pulses: Normal pulses.  Pulmonary:     Effort: Pulmonary effort is normal.     Breath sounds: Normal breath sounds.  Abdominal:     General: Bowel sounds are  normal.  Neurological:     Mental Status: She is alert. Mental status is at baseline.  Psychiatric:        Mood and Affect: Mood normal.        Behavior: Behavior normal.        Thought Content: Thought content normal.        Judgment: Judgment normal.     ASSESSMENT/PLAN:  Osteoporosis, unspecified osteoporosis type, unspecified pathological fracture presence Assessment & Plan: Chronic.  On Prolia injections. Prolia injection administered today by CMA Recent labs acceptable Last DEXA scan 2017. Consider repeat DEXA   Orders: -     Denosumab -     Comprehensive metabolic panel -     VITAMIN D 25 Hydroxy (Vit-D Deficiency, Fractures) -     TSH -     Phosphorus  Aortic atherosclerosis (HCC) Assessment & Plan: Chronic.  On statin therapy tolerating well. Continue Crestor 40 mg daily Check fasting lipids   Orders: -     Lipid panel  Prediabetes Assessment & Plan: Chronic.  Check A1c   Essential hypertension Assessment & Plan: Chronic.  Well-controlled on current medications. Continue metoprolol XL 12.5 mg daily    Iron deficiency -     CBC with Differential/Platelet  Rhinitis, unspecified type Assessment & Plan: Chronic. Trial Allegra 180 mg daily Trial Xyzal 10 mg  nightly Discontinue Zyrtec If no improvement can restart Zyrtec 10 mg daily and discontinue Allegra and Xyzal.   Need for pneumococcal vaccination -     Pneumococcal conjugate vaccine 20-valent  Chronic, continuous use of opioids -     ToxASSURE Select 13 (MW), Urine  Chronic bilateral thoracic back pain Assessment & Plan: Chronic.  Previously prescribed Oxy IR by former PCP.  Reports taking only when in severe pain. PDMP reviewed and appropriate Opioid contract signed UDS today Oxy IR 5 mg daily prn  x 15 tablets  Follow-up in 6 months  Orders: -     oxyCODONE HCl; Take 1 tablet (5 mg total) by mouth daily as needed for severe pain.  Dispense: 15 tablet; Refill: 0  CKD stage 3b,  GFR 30-44 ml/min (HCC) Assessment & Plan: Chronic. Avoid nephrotoxic agents Check c-Met Consider nephrology referral in future.   Coronary artery disease of native artery of native heart with stable angina pectoris [facility]) Assessment & Plan: Chronic.  Status post left heart cath 2023. On Plavix 75 mg daily  On Crestor 40 mg daily Continue to follow with cardiology as scheduled.   HFrEF (heart failure with reduced ejection fraction) (HCC) Assessment & Plan: Chronic.  Asymptomatic.  Recent echo shows LVEF less than 20%.  Status post pacemaker.  Euvolemic on exam. On Demadex 20 mg daily and metoprolol 12.5 mg daily.  Tolerating well Follows with cardiology.     Mood disorder [facility]) Assessment & Plan: Chronic.  Discussed risks of long-term use of benzodiazepines.  Neck Does not require refill today. PDMP reviewed Will need nonopioid contract signed prior to next refill UDS today.   Other orders -     LDL cholesterol, direct  HCM Recommend tetanus booster Recommend shingles vaccine Medicare annual wellness due Pneumonia series completed  PDMP reviewed  Return in about 6 months (around 01/16/2023), or if symptoms worsen or fail to improve, for PCP.  Dana Allan, MD

## 2022-07-16 NOTE — Patient Instructions (Addendum)
It was a pleasure meeting you today. Thank you for allowing me to take part in your health care.  Our goals for today as we discussed include:  Start Xyzal 5 mg at night Start Allegra 180 mg daily Use Nettie Potts daily to flush nasal cavity  We will get some labs today.  If they are abnormal or we need to do something about them, I will call you.  If they are normal, I will send you a message on MyChart (if it is active) or a letter in the mail.  If you don't hear from [place] in 2 weeks, please call the office at the number below.   Received pneumonia 20 vaccine today.  No further vaccines required.  Received Prolia injection today.  Return in 6 months for repeat injection  If you have any questions or concerns, please do not hesitate to call the office at [phone].  I look forward to our next visit and until then take care and stay safe.  Regards,   Dana Allan, MD   [facility]

## 2022-07-17 LAB — COMPREHENSIVE METABOLIC PANEL
ALT: 18 U/L (ref 0–35)
AST: 25 U/L (ref 0–37)
Albumin: 4.5 g/dL (ref 3.5–5.2)
Alkaline Phosphatase: 65 U/L (ref 39–117)
BUN: 31 mg/dL — ABNORMAL HIGH (ref 6–23)
CO2: 30 mEq/L (ref 19–32)
Calcium: 9.8 mg/dL (ref 8.4–10.5)
Chloride: 102 mEq/L (ref 96–112)
Creatinine, Ser: 1.49 mg/dL — ABNORMAL HIGH (ref 0.40–1.20)
GFR: 32.9 mL/min — ABNORMAL LOW (ref >60.00)
Glucose, Bld: 89 mg/dL (ref 70–99)
Potassium: 4.6 mEq/L (ref 3.5–5.1)
Sodium: 143 mEq/L (ref 135–145)
Total Bilirubin: 0.4 mg/dL (ref 0.2–1.2)
Total Protein: 7.6 g/dL (ref 6.0–8.3)

## 2022-07-17 LAB — CBC WITH DIFFERENTIAL/PLATELET
Basophils Absolute: 0 10*3/uL (ref 0.0–0.1)
Basophils Relative: 0.5 % (ref 0.0–3.0)
Eosinophils Absolute: 0.1 10*3/uL (ref 0.0–0.7)
Eosinophils Relative: 1 % (ref 0.0–5.0)
HCT: 42.2 % (ref 36.0–46.0)
Hemoglobin: 14.4 g/dL (ref 12.0–15.0)
Lymphocytes Relative: 17.6 % (ref 12.0–46.0)
Lymphs Abs: 1.7 10*3/uL (ref 0.7–4.0)
MCHC: 34 g/dL (ref 30.0–36.0)
MCV: 97.8 fl (ref 78.0–100.0)
Monocytes Absolute: 0.4 10*3/uL (ref 0.1–1.0)
Monocytes Relative: 4.4 % (ref 3.0–12.0)
Neutro Abs: 7.3 10*3/uL (ref 1.4–7.7)
Neutrophils Relative %: 76.5 % (ref 43.0–77.0)
Platelets: 193 10*3/uL (ref 150.0–400.0)
RBC: 4.32 Mil/uL (ref 3.87–5.11)
RDW: 13.3 % (ref 11.5–15.5)
WBC: 9.5 10*3/uL (ref 4.0–10.5)

## 2022-07-17 LAB — LIPID PANEL
Cholesterol: 168 mg/dL (ref 0–200)
HDL: 45.2 mg/dL (ref >39.00)
NonHDL: 122.44
Total CHOL/HDL Ratio: 4
Triglycerides: 274 mg/dL — ABNORMAL HIGH (ref 0.0–149.0)
VLDL: 54.8 mg/dL — ABNORMAL HIGH (ref 0.0–40.0)

## 2022-07-17 LAB — LDL CHOLESTEROL, DIRECT: Direct LDL: 72 mg/dL

## 2022-07-17 LAB — VITAMIN D 25 HYDROXY (VIT D DEFICIENCY, FRACTURES): VITD: 51.48 ng/mL (ref 30.00–100.00)

## 2022-07-17 LAB — PHOSPHORUS: Phosphorus: 4.6 mg/dL (ref 2.3–4.6)

## 2022-07-17 LAB — TSH: TSH: 2.3 u[IU]/mL (ref 0.35–5.50)

## 2022-07-19 LAB — TOXASSURE SELECT 13 (MW), URINE

## 2022-07-20 ENCOUNTER — Encounter: Payer: Self-pay | Admitting: Family Medicine

## 2022-07-24 ENCOUNTER — Other Ambulatory Visit: Payer: Self-pay | Admitting: Family Medicine

## 2022-07-24 MED ORDER — CETIRIZINE HCL 10 MG PO TABS: 10.0000 mg | ORAL_TABLET | Freq: Every day | ORAL | 11 refills | Status: DC

## 2022-07-24 NOTE — Progress Notes (Signed)
Remote pacemaker transmission.   

## 2022-07-25 ENCOUNTER — Other Ambulatory Visit: Payer: Self-pay

## 2022-07-25 DIAGNOSIS — F419 Anxiety disorder, unspecified: Secondary | ICD-10-CM

## 2022-07-25 MED ORDER — CITALOPRAM HYDROBROMIDE 10 MG PO TABS
10.0000 mg | ORAL_TABLET | Freq: Every day | ORAL | 3 refills | Status: DC
Start: 2022-07-25 — End: 2023-03-12

## 2022-07-25 NOTE — Telephone Encounter (Signed)
Patient is aware 

## 2022-07-31 NOTE — Progress Notes (Incomplete)
   SUBJECTIVE:   Chief Complaint  Patient presents with  . Transfer of Care  . Environmental Allergies    Needs oral medication recommendations, cannot take nasal sprays  . Medication Management    Refill oxycodone   HPI Presents to clinic to transfer care.  Osteoporosis Recent DEXA scan showed T-score -4.8.  Has been on Prolia injections since February 2016. Prolia injection due today.  No recent falls or fractures.  Hypertension Asymptomatic.  Current medications.  Takes metoprolol XL 12.5 Daily, torsemide 20 mg daily and tolerating well.  She follows with cardiology.    PERTINENT PMH / PSH: ***  OBJECTIVE:  BP 122/78   Pulse 80   Ht 5' (1.524 m)   Wt 120 lb (54.4 kg)   SpO2 95%   BMI 23.44 kg/m    Physical Exam Constitutional:      General: She is not in acute distress.    Appearance: Normal appearance. She is normal weight. She is not ill-appearing.  HENT:     Head: Normocephalic.  Eyes:     Conjunctiva/sclera: Conjunctivae normal.  Neck:     Thyroid: No thyromegaly or thyroid tenderness.  Cardiovascular:     Rate and Rhythm: Normal rate and regular rhythm.     Pulses: Normal pulses.  Pulmonary:     Effort: Pulmonary effort is normal.     Breath sounds: Normal breath sounds.  Abdominal:     General: Bowel sounds are normal.  Neurological:     Mental Status: She is alert. Mental status is at baseline.  Psychiatric:        Mood and Affect: Mood normal.        Behavior: Behavior normal.        Thought Content: Thought content normal.        Judgment: Judgment normal.     ASSESSMENT/PLAN:  Osteoporosis, unspecified osteoporosis type, unspecified pathological fracture presence -     Denosumab -     Comprehensive metabolic panel -     VITAMIN D 25 Hydroxy (Vit-D Deficiency, Fractures) -     TSH -     Phosphorus  Aortic atherosclerosis (HCC) -     Lipid panel  Prediabetes  Essential hypertension  Iron deficiency -     CBC with  Differential/Platelet   PDMP reviewed***  No follow-ups on file.  Dana Allan, MD

## 2022-08-02 DIAGNOSIS — F39 Unspecified mood [affective] disorder: Secondary | ICD-10-CM | POA: Insufficient documentation

## 2022-08-02 DIAGNOSIS — I5020 Unspecified systolic (congestive) heart failure: Secondary | ICD-10-CM | POA: Insufficient documentation

## 2022-08-02 DIAGNOSIS — E611 Iron deficiency: Secondary | ICD-10-CM | POA: Insufficient documentation

## 2022-08-02 DIAGNOSIS — Z23 Encounter for immunization: Secondary | ICD-10-CM | POA: Insufficient documentation

## 2022-08-02 DIAGNOSIS — F1190 Opioid use, unspecified, uncomplicated: Secondary | ICD-10-CM | POA: Insufficient documentation

## 2022-08-02 NOTE — Assessment & Plan Note (Signed)
Chronic Check A1c 

## 2022-08-02 NOTE — Assessment & Plan Note (Signed)
Chronic. Avoid nephrotoxic agents Check c-Met Consider nephrology referral in future.

## 2022-08-02 NOTE — Assessment & Plan Note (Signed)
Chronic. Trial Allegra 180 mg daily Trial Xyzal 10 mg nightly Discontinue Zyrtec If no improvement can restart Zyrtec 10 mg daily and discontinue Allegra and Xyzal.

## 2022-08-02 NOTE — Assessment & Plan Note (Signed)
Chronic.  On statin therapy tolerating well. Continue Crestor 40 mg daily Check fasting lipids

## 2022-08-02 NOTE — Assessment & Plan Note (Signed)
Chronic.  Discussed risks of long-term use of benzodiazepines.  Neck Does not require refill today. PDMP reviewed Will need nonopioid contract signed prior to next refill UDS today.

## 2022-08-02 NOTE — Assessment & Plan Note (Signed)
Chronic.  Asymptomatic.  Recent echo shows LVEF less than 20%.  Status post pacemaker.  Euvolemic on exam. On Demadex 20 mg daily and metoprolol 12.5 mg daily.  Tolerating well Follows with cardiology.

## 2022-08-02 NOTE — Assessment & Plan Note (Signed)
Chronic.  Status post left heart cath 2023. On Plavix 75 mg daily  On Crestor 40 mg daily Continue to follow with cardiology as scheduled.

## 2022-08-02 NOTE — Assessment & Plan Note (Signed)
Chronic.  Well-controlled on current medications. Continue metoprolol XL 12.5 mg daily

## 2022-08-02 NOTE — Assessment & Plan Note (Signed)
Chronic.  On Prolia injections. Prolia injection administered today by CMA Recent labs acceptable Last DEXA scan 2017. Consider repeat DEXA

## 2022-08-02 NOTE — Assessment & Plan Note (Addendum)
Chronic.  Previously prescribed Oxy IR by former PCP.  Reports taking only when in severe pain. PDMP reviewed and appropriate Opioid contract signed UDS today Oxy IR 5 mg daily prn  x 15 tablets  Follow-up in 6 months

## 2022-08-28 ENCOUNTER — Ambulatory Visit (INDEPENDENT_AMBULATORY_CARE_PROVIDER_SITE_OTHER): Payer: Medicare HMO | Admitting: *Deleted

## 2022-08-28 VITALS — Ht 60.0 in | Wt 118.0 lb

## 2022-08-28 DIAGNOSIS — Z0000 Encounter for general adult medical examination without abnormal findings: Secondary | ICD-10-CM

## 2022-08-28 NOTE — Patient Instructions (Addendum)
[name] , Thank you for taking time to come for your Medicare Wellness Visit. I appreciate your ongoing commitment to your health goals. Please review the following plan we discussed and let me know if I can assist you in the future.   These are the goals we discussed:  Goals      Increase physical activity     Stay active     Patient Stated     Health to improve so she can get out and socialize again        This is a list of the screening recommended for you and due dates:  Health Maintenance  Topic Date Due   DTaP/Tdap/Td vaccine (1 - Tdap) Never done   Zoster (Shingles) Vaccine (1 of 2) Never done   Flu Shot  10/03/2022   Medicare Annual Wellness Visit  08/28/2023   Pneumonia Vaccine  Completed   DEXA scan (bone density measurement)  Completed   COVID-19 Vaccine  Completed   HPV Vaccine  Aged Out   Hepatitis C Screening  Discontinued    Advanced directives: none  Conditions/risks identified: none  Next appointment: Follow up in one year for your annual wellness visit 09/02/23 @ 11:30 telephoneManaging Pain Without Opioids Opioids are strong medicines used to treat moderate to severe pain. For some people, especially those who have long-term (chronic) pain, opioids may not be the best choice for pain management due to: Side effects like nausea, constipation, and sleepiness. The risk of addiction (opioid use disorder). The longer you take opioids, the greater your risk of addiction. Pain that lasts for more than 3 months is called chronic pain. Managing chronic pain usually requires more than one approach and is often provided by a team of health care providers working together (multidisciplinary approach). Pain management may be done at a pain management center or pain clinic. How to manage pain without the use of opioids Use non-opioid medicines Non-opioid medicines for pain may include: Over-the-counter or prescription non-steroidal anti-inflammatory drugs (NSAIDs). These  may be the first medicines used for pain. They work well for muscle and bone pain, and they reduce swelling. Acetaminophen. This over-the-counter medicine may work well for milder pain but not swelling. Antidepressants. These may be used to treat chronic pain. A certain type of antidepressant (tricyclics) is often used. These medicines are given in lower doses for pain than when used for depression. Anticonvulsants. These are usually used to treat seizures but may also reduce nerve (neuropathic) pain. Muscle relaxants. These relieve pain caused by sudden muscle tightening (spasms). You may also use a pain medicine that is applied to the skin as a patch, cream, or gel (topical analgesic), such as a numbing medicine. These may cause fewer side effects than medicines taken by mouth. Do certain therapies as directed Some therapies can help with pain management. They include: Physical therapy. You will do exercises to gain strength and flexibility. A physical therapist may teach you exercises to move and stretch parts of your body that are weak, stiff, or painful. You can learn these exercises at physical therapy visits and practice them at home. Physical therapy may also involve: Massage. Heat wraps or applying heat or cold to affected areas. Electrical signals that interrupt pain signals (transcutaneous electrical nerve stimulation, TENS). Weak lasers that reduce pain and swelling (low-level laser therapy). Signals from your body that help you learn to regulate pain (biofeedback). Occupational therapy. This helps you to learn ways to function at home and work with less  pain. Recreational therapy. This involves trying new activities or hobbies, such as a physical activity or drawing. Mental health therapy, including: Cognitive behavioral therapy (CBT). This helps you learn coping skills for dealing with pain. Acceptance and commitment therapy (ACT) to change the way you think and react to  pain. Relaxation therapies, including muscle relaxation exercises and mindfulness-based stress reduction. Pain management counseling. This may be individual, family, or group counseling.  Receive medical treatments Medical treatments for pain management include: Nerve block injections. These may include a pain blocker and anti-inflammatory medicines. You may have injections: Near the spine to relieve chronic back or neck pain. Into joints to relieve back or joint pain. Into nerve areas that supply a painful area to relieve body pain. Into muscles (trigger point injections) to relieve some painful muscle conditions. A medical device placed near your spine to help block pain signals and relieve nerve pain or chronic back pain (spinal cord stimulation device). Acupuncture. Follow these instructions at home Medicines Take over-the-counter and prescription medicines only as told by your health care provider. If you are taking pain medicine, ask your health care providers about possible side effects to watch out for. Do not drive or use heavy machinery while taking prescription opioid pain medicine. Lifestyle  Do not use drugs or alcohol to reduce pain. If you drink alcohol, limit how much you have to: 0-1 drink a day for women who are not pregnant. 0-2 drinks a day for men. Know how much alcohol is in a drink. In the [place] one drink equals one 12 oz bottle of beer (355 mL), one 5 oz glass of wine (148 mL), or one 1 oz glass of hard liquor (44 mL). Do not use any products that contain nicotine or tobacco. These products include cigarettes, chewing tobacco, and vaping devices, such as e-cigarettes. If you need help quitting, ask your health care provider. Eat a healthy diet and maintain a healthy weight. Poor diet and excess weight may make pain worse. Eat foods that are high in fiber. These include fresh fruits and vegetables, whole grains, and beans. Limit foods that are high in fat and  processed sugars, such as fried and sweet foods. Exercise regularly. Exercise lowers stress and may help relieve pain. Ask your health care provider what activities and exercises are safe for you. If your health care provider approves, join an exercise class that combines movement and stress reduction. Examples include yoga and tai chi. Get enough sleep. Lack of sleep may make pain worse. Lower stress as much as possible. Practice stress reduction techniques as told by your therapist. General instructions Work with all your pain management providers to find the treatments that work best for you. You are an important member of your pain management team. There are many things you can do to reduce pain on your own. Consider joining an online or in-person support group for people who have chronic pain. Keep all follow-up visits. This is important. Where to find more information You can find more information about managing pain without opioids from: American Academy of Pain Medicine: painmed.org Institute for Chronic Pain: instituteforchronicpain.org American Chronic Pain Association: theacpa.org Contact a health care provider if: You have side effects from pain medicine. Your pain gets worse or does not get better with treatments or home therapy. You are struggling with anxiety or depression. Summary Many types of pain can be managed without opioids. Chronic pain may respond better to pain management without opioids. Pain is best managed when you  and a team of health care providers work together. Pain management without opioids may include non-opioid medicines, medical treatments, physical therapy, mental health therapy, and lifestyle changes. Tell your health care providers if your pain gets worse or is not being managed well enough. This information is not intended to replace advice given to you by your health care provider. Make sure you discuss any questions you have with your health care  provider. Document Revised: 05/31/2020 Document Reviewed: 05/31/2020 Elsevier Patient Education  2024 [employer].    Preventive Care 65 Years and Older, Female Preventive care refers to lifestyle choices and visits with your health care provider that can promote health and wellness. What does preventive care include? A yearly physical exam. This is also called an annual well check. Dental exams once or twice a year. Routine eye exams. Ask your health care provider how often you should have your eyes checked. Personal lifestyle choices, including: Daily care of your teeth and gums. Regular physical activity. Eating a healthy diet. Avoiding tobacco and drug use. Limiting alcohol use. Practicing safe sex. Taking low-dose aspirin every day. Taking vitamin and mineral supplements as recommended by your health care provider. What happens during an annual well check? The services and screenings done by your health care provider during your annual well check will depend on your age, overall health, lifestyle risk factors, and family history of disease. Counseling  Your health care provider may ask you questions about your: Alcohol use. Tobacco use. Drug use. Emotional well-being. Home and relationship well-being. Sexual activity. Eating habits. History of falls. Memory and ability to understand (cognition). Work and work Astronomer. Reproductive health. Screening  You may have the following tests or measurements: Height, weight, and BMI. Blood pressure. Lipid and cholesterol levels. These may be checked every 5 years, or more frequently if you are over 79 years old. Skin check. Lung cancer screening. You may have this screening every year starting at age 78 if you have a 30-pack-year history of smoking and currently smoke or have quit within the past 15 years. Fecal occult blood test (FOBT) of the stool. You may have this test every year starting at age 54. Flexible  sigmoidoscopy or colonoscopy. You may have a sigmoidoscopy every 5 years or a colonoscopy every 10 years starting at age 41. Hepatitis C blood test. Hepatitis B blood test. Sexually transmitted disease (STD) testing. Diabetes screening. This is done by checking your blood sugar (glucose) after you have not eaten for a while (fasting). You may have this done every 1-3 years. Bone density scan. This is done to screen for osteoporosis. You may have this done starting at age 51. Mammogram. This may be done every 1-2 years. Talk to your health care provider about how often you should have regular mammograms. Talk with your health care provider about your test results, treatment options, and if necessary, the need for more tests. Vaccines  Your health care provider may recommend certain vaccines, such as: Influenza vaccine. This is recommended every year. Tetanus, diphtheria, and acellular pertussis (Tdap, Td) vaccine. You may need a Td booster every 10 years. Zoster vaccine. You may need this after age 41. Pneumococcal 13-valent conjugate (PCV13) vaccine. One dose is recommended after age 5. Pneumococcal polysaccharide (PPSV23) vaccine. One dose is recommended after age 104. Talk to your health care provider about which screenings and vaccines you need and how often you need them. This information is not intended to replace advice given to you by your health care  provider. Make sure you discuss any questions you have with your health care provider. Document Released: 03/17/2015 Document Revised: 11/08/2015 Document Reviewed: 12/20/2014 Elsevier Interactive Patient Education  2017 [employer].  Fall Prevention in the Home Falls can cause injuries. They can happen to people of all ages. There are many things you can do to make your home safe and to help prevent falls. What can I do on the outside of my home? Regularly fix the edges of walkways and driveways and fix any cracks. Remove anything that  might make you trip as you walk through a door, such as a raised step or threshold. Trim any bushes or trees on the path to your home. Use bright outdoor lighting. Clear any walking paths of anything that might make someone trip, such as rocks or tools. Regularly check to see if handrails are loose or broken. Make sure that both sides of any steps have handrails. Any raised decks and porches should have guardrails on the edges. Have any leaves, snow, or ice cleared regularly. Use sand or salt on walking paths during winter. Clean up any spills in your garage right away. This includes oil or grease spills. What can I do in the bathroom? Use night lights. Install grab bars by the toilet and in the tub and shower. Do not use towel bars as grab bars. Use non-skid mats or decals in the tub or shower. If you need to sit down in the shower, use a plastic, non-slip stool. Keep the floor dry. Clean up any water that spills on the floor as soon as it happens. Remove soap buildup in the tub or shower regularly. Attach bath mats securely with double-sided non-slip rug tape. Do not have throw rugs and other things on the floor that can make you trip. What can I do in the bedroom? Use night lights. Make sure that you have a light by your bed that is easy to reach. Do not use any sheets or blankets that are too big for your bed. They should not hang down onto the floor. Have a firm chair that has side arms. You can use this for support while you get dressed. Do not have throw rugs and other things on the floor that can make you trip. What can I do in the kitchen? Clean up any spills right away. Avoid walking on wet floors. Keep items that you use a lot in easy-to-reach places. If you need to reach something above you, use a strong step stool that has a grab bar. Keep electrical cords out of the way. Do not use floor polish or wax that makes floors slippery. If you must use wax, use non-skid floor  wax. Do not have throw rugs and other things on the floor that can make you trip. What can I do with my stairs? Do not leave any items on the stairs. Make sure that there are handrails on both sides of the stairs and use them. Fix handrails that are broken or loose. Make sure that handrails are as long as the stairways. Check any carpeting to make sure that it is firmly attached to the stairs. Fix any carpet that is loose or worn. Avoid having throw rugs at the top or bottom of the stairs. If you do have throw rugs, attach them to the floor with carpet tape. Make sure that you have a light switch at the top of the stairs and the bottom of the stairs. If you do not have  them, ask someone to add them for you. What else can I do to help prevent falls? Wear shoes that: Do not have high heels. Have rubber bottoms. Are comfortable and fit you well. Are closed at the toe. Do not wear sandals. If you use a stepladder: Make sure that it is fully opened. Do not climb a closed stepladder. Make sure that both sides of the stepladder are locked into place. Ask someone to hold it for you, if possible. Clearly mark and make sure that you can see: Any grab bars or handrails. First and last steps. Where the edge of each step is. Use tools that help you move around (mobility aids) if they are needed. These include: Canes. Walkers. Scooters. Crutches. Turn on the lights when you go into a dark area. Replace any light bulbs as soon as they burn out. Set up your furniture so you have a clear path. Avoid moving your furniture around. If any of your floors are uneven, fix them. If there are any pets around you, be aware of where they are. Review your medicines with your doctor. Some medicines can make you feel dizzy. This can increase your chance of falling. Ask your doctor what other things that you can do to help prevent falls. This information is not intended to replace advice given to you by your  health care provider. Make sure you discuss any questions you have with your health care provider. Document Released: 12/15/2008 Document Revised: 07/27/2015 Document Reviewed: 03/25/2014 Elsevier Interactive Patient Education  2017 [employer].

## 2022-08-28 NOTE — Progress Notes (Signed)
Subjective:   [name] is a 82 y.o. female who presents for Medicare Annual (Subsequent) preventive examination.  Visit Complete: Virtual  I connected with  MAURY BAMBA on 08/28/22 by a audio enabled telemedicine application and verified that I am speaking with the correct person using two identifiers.  Patient Location: Home  Provider Location: Office/Clinic  I discussed the limitations of evaluation and management by telemedicine. The patient expressed understanding and agreed to proceed.   Review of Systems     Cardiac Risk Factors include: advanced age (>36men, >64 women);sedentary lifestyle;hypertension;dyslipidemia;Other (see comment), Risk factor comments: CAD     Objective:    Today's Vitals   08/28/22 1502  Weight: 118 lb (53.5 kg)  Height: 5' (1.524 m)   Body mass index is 23.05 kg/m.     08/28/2022    3:23 PM 12/31/2021    6:04 PM 12/31/2021   10:54 AM 06/07/2021    9:28 AM 06/05/2021    8:00 PM 03/01/2021   11:27 AM 02/29/2020   11:44 AM  Advanced Directives  Does Patient Have a Medical Advance Directive? Yes No Yes Yes Yes Yes Yes  Type of Estate agent of [place];Living will   Living will Living will Healthcare Power of [place];Living will Healthcare Power of [place];Living will  Does patient want to make changes to medical advance directive?    No - Patient declined No - Patient declined No - Patient declined No - Patient declined  Copy of Healthcare Power of Attorney in Chart? No - copy requested     No - copy requested No - copy requested    Current Medications (verified) Outpatient Encounter Medications as of 08/28/2022  Medication Sig   ALPRAZolam (XANAX) 0.25 MG tablet TAKE ONE TABLET BY MOUTH ONCE TO TWICE DAILY AS NEEDED (Patient taking differently: Take 0.25 mg by mouth 2 (two) times daily as needed for anxiety.)   cetirizine (ZYRTEC) 10 MG tablet Take 1 tablet (10 mg total) by mouth daily.   clopidogrel (PLAVIX) 75  MG tablet TAKE ONE TABLET BY MOUTH ONE TIME DAILY   denosumab (PROLIA) 60 MG/ML SOSY injection Inject 60 mg into the skin every 6 (six) months.   metoprolol succinate (TOPROL-XL) 25 MG 24 hr tablet TAKE ONE-HALF TABLET BY MOUTH ONE TIME DAILY   ondansetron (ZOFRAN-ODT) 4 MG disintegrating tablet TAKE 1 TABLET BY MOUTH TWICE A DAY AS NEEDED FOR NAUSEA AND VOMITING appt further refills no exceptions   oxyCODONE (ROXICODONE) 5 MG immediate release tablet Take 1 tablet (5 mg total) by mouth daily as needed for severe pain.   rosuvastatin (CRESTOR) 40 MG tablet Take 1 tablet (40 mg total) by mouth daily.   torsemide (DEMADEX) 20 MG tablet Take 2 tablets (40 mg total) by mouth daily.   citalopram (CELEXA) 10 MG tablet Take 1 tablet (10 mg total) by mouth daily. (Patient not taking: Reported on 08/28/2022)   No facility-administered encounter medications on file as of 08/28/2022.    Allergies (verified) Ipratropium and Sugar-protein-starch   History: Past Medical History:  Diagnosis Date   Anxiety    Arthritis    CAD (coronary artery disease)    a. cardiac cath 10/2014: ostial LAD 30%, prox LAD 90% s/p PCI/DES, ostial RCA 30%, mid RCA 90% s/p PCI/DES, ostial Ramus 50%   Chronic back pain    Chronic fatigue    Chronic systolic CHF (congestive heart failure) (HCC)    a. 10/2014 Echo: EF 20%; b. 06/2017 Echo:  EF 20-25%, diff HK, Gr2 DD, Mild AI. Mod to sev MR. Mod dil LA.   Hepatitis C    Ischemic cardiomyopathy    a. 10/2014 Echo: EF 20%; b. 06/2017 Echo: EF 20-25%.   LBBB (left bundle branch block)    Moderate to Severe Mitral regurgitation    a. 06/2017 Echo: Mod-Sev MR.   Osteoporosis    UTI (lower urinary tract infection)    Past Surgical History:  Procedure Laterality Date   BACK SURGERY  09/01/2012   T10, L1   BIV PACEMAKER INSERTION CRT-P N/A 01/02/2022   Procedure: BIV PACEMAKER INSERTION CRT-P;  Surgeon: Marinus Maw, MD;  Location: [facility] INVASIVE CV LAB;  Service: Cardiovascular;   Laterality: N/A;   CARDIAC CATHETERIZATION Bilateral 10/27/2014   Procedure: Left Heart Cath and Coronary Angiography;  Surgeon: Antonieta Iba, MD;  Location: [facility] INVASIVE CV LAB;  Service: Cardiovascular;  Laterality: Bilateral;   CARDIAC CATHETERIZATION N/A 10/27/2014   Procedure: Coronary Stent Intervention;  Surgeon: Iran Ouch, MD;  Location: [facility] INVASIVE CV LAB;  Service: Cardiovascular;  Laterality: N/A;   CORONARY PRESSURE/FFR STUDY N/A 06/13/2021   Procedure: INTRAVASCULAR PRESSURE WIRE/FFR STUDY;  Surgeon: Yvonne Kendall, MD;  Location: [facility] INVASIVE CV LAB;  Service: Cardiovascular;  Laterality: N/A;   CORONARY STENT INTERVENTION N/A 06/13/2021   Procedure: CORONARY STENT INTERVENTION;  Surgeon: Yvonne Kendall, MD;  Location: [facility] INVASIVE CV LAB;  Service: Cardiovascular;  Laterality: N/A;   HIP ARTHROPLASTY Left 06/07/2021   Procedure: ARTHROPLASTY BIPOLAR HIP (HEMIARTHROPLASTY);  Surgeon: Signa Kell, MD;  Location: [facility] ORS;  Service: Orthopedics;  Laterality: Left;   PACEMAKER IMPLANT  01/2022   RIGHT/LEFT HEART CATH AND CORONARY ANGIOGRAPHY N/A 06/13/2021   Procedure: RIGHT/LEFT HEART CATH AND CORONARY ANGIOGRAPHY;  Surgeon: Yvonne Kendall, MD;  Location: [facility] INVASIVE CV LAB;  Service: Cardiovascular;  Laterality: N/A;   Family History  Problem Relation Age of Onset   Arthritis Mother    Heart disease Father        passed at 2   Diabetes Father    Social History   Socioeconomic History   Marital status: Married    Spouse name: [name]   Number of children: 0   Years of education: 14   Highest education level: Not on file  Occupational History   Occupation: Receptionist    Comment: Retired in 1990  Tobacco Use   Smoking status: Former    Packs/day: 1.00    Years: 10.00    Additional pack years: 0.00    Total pack years: 10.00    Types: Cigarettes   Smokeless tobacco: Never  Vaping Use   Vaping Use: Never used  Substance and Sexual  Activity   Alcohol use: No    Alcohol/week: 0.0 standard drinks of alcohol   Drug use: No   Sexual activity: Yes    Birth control/protection: Post-menopausal  Other Topics Concern   Not on file  Social History Narrative   Pt is 82 yo female, married to husband of 21 years.  Pt has no children.  Pt lives with husband and teacup poodle, [name]. 59 y.o as of 01/01/19.  Pt states she hurt her back last year and has not been able to participate in much physical activity since. Pt enjoys traveling with her husband in motor home, doing crossword puzzles with husband, and playing cards with friends.  Pt was raised in [place] and has lived in [place] entire adult life.  Pt worked in [employer]  plant in [place] until [place] when they closed.     Social Determinants of Health   Financial Resource Strain: Low Risk  (08/28/2022)   Overall Financial Resource Strain (CARDIA)    Difficulty of Paying Living Expenses: Not hard at all  Food Insecurity: No Food Insecurity (08/28/2022)   Hunger Vital Sign    Worried About Running Out of Food in the Last Year: Never true    Ran Out of Food in the Last Year: Never true  Transportation Needs: No Transportation Needs (08/28/2022)   PRAPARE - Administrator, Civil Service (Medical): No    Lack of Transportation (Non-Medical): No  Physical Activity: Insufficiently Active (08/28/2022)   Exercise Vital Sign    Days of Exercise per Week: 3 days    Minutes of Exercise per Session: 20 min  Stress: No Stress Concern Present (08/28/2022)   [employer] of Occupational Health - Occupational Stress Questionnaire    Feeling of Stress : Not at all  Social Connections: Moderately Isolated (08/28/2022)   Social Connection and Isolation Panel [NHANES]    Frequency of Communication with Friends and Family: More than three times a week    Frequency of Social Gatherings with Friends and Family: Once a week    Attends Religious Services: Never    Loss adjuster, chartered or Organizations: No    Attends Engineer, structural: Never    Marital Status: Married    Tobacco Counseling Counseling given: Not Answered   Clinical Intake:  Pre-visit preparation completed: Yes  Pain : No/denies pain     BMI - recorded: 23.05 Nutritional Status: BMI of 19-24  Normal Nutritional Risks: None Diabetes: No  How often do you need to have someone help you when you read instructions, pamphlets, or other written materials from your doctor or pharmacy?: 1 - Never  Interpreter Needed?: No      Activities of Daily Living    08/28/2022    3:04 PM 01/03/2022    2:03 AM  In your present state of health, do you have any difficulty performing the following activities:  Hearing? 0 0  Vision? 0 0  Difficulty concentrating or making decisions? 0 0  Walking or climbing stairs? 0 0  Comment walker   Dressing or bathing? 0 0  Doing errands, shopping? 1 0  Comment husband helps   Preparing Food and eating ? Y   Using the Toilet? N   In the past six months, have you accidently leaked urine? N   Do you have problems with loss of bowel control? N   Managing your Medications? N   Managing your Finances? Y   Comment husband takes care of   Housekeeping or managing your Housekeeping? Y   Comment husband does     Patient Care Team: Dana Allan, MD as PCP - General (Family Medicine) Antonieta Iba, MD as PCP - Cardiology (Cardiology) Marinus Maw, MD as PCP - Electrophysiology (Cardiology) Delma Freeze, FNP as Nurse Practitioner (Cardiology) Antonieta Iba, MD as Consulting Physician (Cardiology)  Indicate any recent Medical Services you may have received from other than [facility] providers in the past year (date may be approximate).     Assessment:   This is a routine wellness examination for Druanne.  Hearing/Vision screen No results found.  Dietary issues and exercise activities discussed:     Goals Addressed              This Visit's  Progress    Patient Stated       Health to improve so she can get out and socialize again      Depression Screen    08/28/2022    3:13 PM 09/11/2021    1:29 PM 03/01/2021   11:29 AM 02/29/2020   11:42 AM 12/28/2018   12:20 PM 05/14/2017    9:13 AM 11/18/2016    2:36 PM  PHQ 2/9 Scores  PHQ - 2 Score 0 0 0 0 0 0 0  PHQ- 9 Score 0          Fall Risk    09/11/2021    1:28 PM 03/01/2021   11:28 AM 06/27/2020   11:02 AM 02/29/2020   11:44 AM 07/08/2019    1:20 PM  Fall Risk   Falls in the past year? 1 0 0 0 0  Number falls in past yr: 1 0 0 0 0  Injury with Fall? 1  0 0 0  Risk for fall due to : History of fall(s)  Impaired balance/gait  Impaired balance/gait;Impaired mobility  Follow up Falls evaluation completed Falls evaluation completed Falls evaluation completed Falls evaluation completed Falls evaluation completed    MEDICARE RISK AT HOME:    Cognitive Function:        08/28/2022    3:24 PM 03/01/2021   11:45 AM 02/29/2020   11:57 AM 12/28/2018   12:24 PM  6CIT Screen  What Year? 0 points 0 points 0 points 0 points  What month? 0 points 0 points 0 points 0 points  What time? 0 points 0 points 0 points 0 points  Count back from 20 2 points 0 points  0 points  Months in reverse 2 points 4 points 0 points 0 points  Repeat phrase 0 points 2 points  0 points  Total Score 4 points 6 points  0 points    Immunizations Immunization History  Administered Date(s) Administered   Covid-19, Mrna,Vaccine(Spikevax)27yrs and older 11/28/2021   Fluad Quad(high Dose 65+) 12/12/2020   Influenza Split 11/05/2012, 11/16/2013   Influenza, High Dose Seasonal PF 11/20/2016, 11/12/2017, 11/19/2018   Influenza-Unspecified 11/23/2014, 11/26/2019   PFIZER Comirnaty(Gray Top)Covid-19 Tri-Sucrose Vaccine 06/12/2020   PFIZER(Purple Top)SARS-COV-2 Vaccination 03/30/2019, 04/20/2019, 12/10/2019   PNEUMOCOCCAL CONJUGATE-20 07/16/2022   Pneumococcal Conjugate-13 02/06/2015    Pneumococcal Polysaccharide-23 03/04/2010, 05/14/2017   Respiratory Syncytial Virus Vaccine,Recomb Aduvanted(Arexvy) 03/13/2022    TDAP status: Due, Education has been provided regarding the importance of this vaccine. Advised may receive this vaccine at local pharmacy or Health Dept. Aware to provide a copy of the vaccination record if obtained from local pharmacy or Health Dept. Verbalized acceptance and understanding.  Flu Vaccine status: Up to date 11/28/21 per patient [facility]  Pneumococcal vaccine status: Up to date  Covid-19 vaccine status: Completed vaccines  Qualifies for Shingles Vaccine? Yes   Zostavax completed No   Shingrix Completed?: No.    Education has been provided regarding the importance of this vaccine. Patient has been advised to call insurance company to determine out of pocket expense if they have not yet received this vaccine. Advised may also receive vaccine at local pharmacy or Health Dept. Verbalized acceptance and understanding.  Screening Tests Health Maintenance  Topic Date Due   DTaP/Tdap/Td (1 - Tdap) Never done   Zoster Vaccines- Shingrix (1 of 2) Never done   INFLUENZA VACCINE  10/03/2022   Medicare Annual Wellness (AWV)  08/28/2023   Pneumonia Vaccine 52+ Years old  Completed  DEXA SCAN  Completed   COVID-19 Vaccine  Completed   HPV VACCINES  Aged Out   Hepatitis C Screening  Discontinued    Health Maintenance  Health Maintenance Due  Topic Date Due   DTaP/Tdap/Td (1 - Tdap) Never done   Zoster Vaccines- Shingrix (1 of 2) Never done    Colonoscopy, aged out patient does not want anymore  Mammogram status: No longer required due to age.  Bone Density status: Completed 01/22/16. Results reflect: Bone density results: OSTEOPOROSIS. Repeat every 2 years. Patient does not want to have any  more unable to lie down and do test  Lung Cancer Screening: (Low Dose CT Chest recommended if Age 85-80 years, 20 pack-year currently smoking OR have quit  w/in 15years.) does not qualify.     Additional Screening:  Hepatitis C Screening: does not qualify;  Vision Screening: Recommended annual ophthalmology exams for early detection of glaucoma and other disorders of the eye. Is the patient up to date with their annual eye exam?  Yes  Who is the provider or what is the name of the office in which the patient attends annual eye exams? Dr. Larence Penning If pt is not established with a provider, would they like to be referred to a provider to establish care? No .   Dental Screening: Recommended annual dental exams for proper oral hygiene    Community Resource Referral / Chronic Care Management: CRR required this visit?  No   CCM required this visit?  No     Plan:     I have personally reviewed and noted the following in the patient's chart:   Medical and social history Use of alcohol, tobacco or illicit drugs  Current medications and supplements including opioid prescriptions. Patient is currently taking opioid prescriptions. Information provided to patient regarding non-opioid alternatives. Patient advised to discuss non-opioid treatment plan with their provider. Functional ability and status Nutritional status Physical activity Advanced directives List of other physicians Hospitalizations, surgeries, and ER visits in previous 12 months Vitals Screenings to include cognitive, depression, and falls Referrals and appointments  In addition, I have reviewed and discussed with patient certain preventive protocols, quality metrics, and best practice recommendations. A written personalized care plan for preventive services as well as general preventive health recommendations were provided to patient.     Sydell Axon, LPN   08/29/3149   After Visit Summary: (MyChart) Due to this being a telephonic visit, the after visit summary with patients personalized plan was offered to patient via MyChart   Nurse Notes: Patient states that she is  unable to take the allergy medication that was prescribed.

## 2022-09-23 ENCOUNTER — Telehealth: Payer: Self-pay | Admitting: Cardiovascular Disease

## 2022-09-23 DIAGNOSIS — Z79899 Other long term (current) drug therapy: Secondary | ICD-10-CM

## 2022-09-23 NOTE — Telephone Encounter (Signed)
Called the patient back concerning her phone call about the Torsemide. She stated that whenever she takes the torsemide, she also gets a runny nose. She has been advised to try an allergy medicine and replied that she has tried everything for 2 years and she does not want to take this medication anymore. She stated that Dr. Mariah Milling knows about this issue and would like for him to address it.

## 2022-09-23 NOTE — Telephone Encounter (Signed)
Pt c/o medication issue:  1. Name of Medication: torsemide (DEMADEX) 20 MG tablet   2. How are you currently taking this medication (dosage and times per day)? As prescribed   3. Are you having a reaction (difficulty breathing--STAT)? Yes   4. What is your medication issue? Patient believes that this medication is causing her nose to run frequently and would like a call back to see if there are alternatives to this medication or if this is something else that could be causing the issue. Please advise.

## 2022-09-25 ENCOUNTER — Encounter: Payer: Self-pay | Admitting: Cardiovascular Disease

## 2022-09-27 ENCOUNTER — Telehealth: Payer: Self-pay | Admitting: Family Medicine

## 2022-09-27 NOTE — Telephone Encounter (Signed)
Patient dropped off document    disability parking placard, to be filled out by provider. Patient requested to send it via Mail within 2-days. Document is located in providers folder at front office.Please advise at Mobile [phone] (mobile).

## 2022-09-30 MED ORDER — TORSEMIDE 20 MG PO TABS: 40.0000 mg | ORAL_TABLET | Freq: Every day | ORAL | Status: DC | PRN

## 2022-09-30 MED ORDER — METOLAZONE 2.5 MG PO TABS: 2.5000 mg | ORAL_TABLET | Freq: Every day | ORAL | 3 refills | Status: DC

## 2022-09-30 NOTE — Telephone Encounter (Signed)
Called and spoke with patient. Informed her of the following recommendations from Dr. Mariah Milling.  We can try a different diuretic She can change the torsemide to metolazone 2.5 mg daily Would take torsemide prn  for 3 pound weight gain After the medication change, would check BMP in 3 weeks Would also report back to [place] on any weight change as medication dose may need to be increased Thx TG   Patient verbalized understanding. Prescription sent to preferred pharmacy and orders placed for BMP.

## 2022-09-30 NOTE — Telephone Encounter (Signed)
Patient wants call back on next steps.

## 2022-10-01 NOTE — Telephone Encounter (Signed)
Pt informed form was filled out. She requested that it be mailed to her. Sent out on 09/30/2022.

## 2022-10-03 ENCOUNTER — Ambulatory Visit (INDEPENDENT_AMBULATORY_CARE_PROVIDER_SITE_OTHER): Payer: Medicare HMO

## 2022-10-03 DIAGNOSIS — I447 Left bundle-branch block, unspecified: Secondary | ICD-10-CM | POA: Diagnosis not present

## 2022-10-03 LAB — CUP PACEART REMOTE DEVICE CHECK
Battery Remaining Longevity: 79 mo
Battery Remaining Percentage: 90 %
Battery Voltage: 2.99 V
Brady Statistic AP VP Percent: 7.4 %
Brady Statistic AP VS Percent: 1 %
Brady Statistic AS VP Percent: 91 %
Brady Statistic AS VS Percent: 1 %
Brady Statistic RA Percent Paced: 5.6 %
Date Time Interrogation Session: 20240801020010
Implantable Lead Connection Status: 753985
Implantable Lead Connection Status: 753985
Implantable Lead Connection Status: 753985
Implantable Lead Implant Date: 20231101
Implantable Lead Implant Date: 20231101
Implantable Lead Implant Date: 20231101
Implantable Lead Location: 753858
Implantable Lead Location: 753859
Implantable Lead Location: 753860
Implantable Pulse Generator Implant Date: 20231101
Lead Channel Impedance Value: 310 Ohm
Lead Channel Impedance Value: 430 Ohm
Lead Channel Impedance Value: 840 Ohm
Lead Channel Pacing Threshold Amplitude: 0.5 V
Lead Channel Pacing Threshold Amplitude: 0.5 V
Lead Channel Pacing Threshold Amplitude: 1.25 V
Lead Channel Pacing Threshold Pulse Width: 0.5 ms
Lead Channel Pacing Threshold Pulse Width: 0.5 ms
Lead Channel Pacing Threshold Pulse Width: 0.5 ms
Lead Channel Sensing Intrinsic Amplitude: 2.9 mV
Lead Channel Sensing Intrinsic Amplitude: 5.8 mV
Lead Channel Setting Pacing Amplitude: 2 V
Lead Channel Setting Pacing Amplitude: 2 V
Lead Channel Setting Pacing Amplitude: 2.5 V
Lead Channel Setting Pacing Pulse Width: 0.5 ms
Lead Channel Setting Pacing Pulse Width: 0.5 ms
Lead Channel Setting Sensing Sensitivity: 2 mV
Pulse Gen Model: 3562
Pulse Gen Serial Number: 8124818

## 2022-10-15 NOTE — Progress Notes (Signed)
Remote pacemaker transmission.   

## 2022-10-16 DIAGNOSIS — H35033 Hypertensive retinopathy, bilateral: Secondary | ICD-10-CM | POA: Diagnosis not present

## 2022-10-16 DIAGNOSIS — H2513 Age-related nuclear cataract, bilateral: Secondary | ICD-10-CM | POA: Diagnosis not present

## 2022-10-16 DIAGNOSIS — H5203 Hypermetropia, bilateral: Secondary | ICD-10-CM | POA: Diagnosis not present

## 2022-10-16 DIAGNOSIS — H524 Presbyopia: Secondary | ICD-10-CM | POA: Diagnosis not present

## 2022-10-16 DIAGNOSIS — H52223 Regular astigmatism, bilateral: Secondary | ICD-10-CM | POA: Diagnosis not present

## 2022-10-22 ENCOUNTER — Other Ambulatory Visit: Payer: Self-pay | Admitting: Cardiovascular Disease

## 2022-10-22 NOTE — Telephone Encounter (Signed)
Please contact pt for future appointment. Pt overdue for 6 month f/u.  

## 2022-10-23 ENCOUNTER — Telehealth: Payer: Self-pay | Admitting: Cardiovascular Disease

## 2022-10-23 ENCOUNTER — Other Ambulatory Visit: Payer: Self-pay | Admitting: Cardiovascular Disease

## 2022-10-23 NOTE — Telephone Encounter (Signed)
Pt is scheduled on 10/21

## 2022-10-23 NOTE — Telephone Encounter (Signed)
*  STAT* If patient is at the pharmacy, call can be transferred to refill team.   1. Which medications need to be refilled? (please list name of each medication and dose if known)   clopidogrel (PLAVIX) 75 MG tablet  metoprolol succinate (TOPROL-XL) 25 MG 24 hr tablet  torsemide (DEMADEX) 20 MG tablet   2. Would you like to learn more about the convenience, safety, & potential cost savings by using the [facility]?   3. Are you open to using the [facility] (Type [facility]. ).  4. Which pharmacy/location (including street and city if local pharmacy) is medication to be sent to?  [facility] [address] - [place], [place] - [address]   5. Do they need a 30 day or 90 day supply?   90 day  Patient stated she is just about out of the medication.

## 2022-10-23 NOTE — Telephone Encounter (Signed)
Received a refill request for Torsemide 20 mg daily except for Sundays. Per last phone message and medication list patient was changed Metolazone 2.5 mg dailly and  Toresmide 20 mg daily as needed for weight gain of 3 lb in a day. Patient states that she took the Metolazone one time and went back to taking Torsemide 20 mg daily including Sundays. Will forward to Dr. Mariah Milling for review.    One of the nurses should be calling you We can try a different diuretic to see if it works Metolazone 2.5 mg daily But would take the torsemide daily as needed for 3 pound weight gain Thx TGollan

## 2022-10-23 NOTE — Telephone Encounter (Signed)
Refills were sent to pharmacy 

## 2022-10-27 ENCOUNTER — Other Ambulatory Visit: Payer: Self-pay | Admitting: Cardiovascular Disease

## 2022-10-31 ENCOUNTER — Telehealth: Payer: Self-pay

## 2022-10-31 ENCOUNTER — Ambulatory Visit
Admission: EM | Admit: 2022-10-31 | Discharge: 2022-10-31 | Disposition: A | Discharge status: Home / Self Care | Payer: Medicare HMO | Attending: Emergency Medicine | Admitting: Emergency Medicine

## 2022-10-31 DIAGNOSIS — J0100 Acute maxillary sinusitis, unspecified: Secondary | ICD-10-CM

## 2022-10-31 DIAGNOSIS — R519 Headache, unspecified: Secondary | ICD-10-CM

## 2022-10-31 MED ORDER — AMOXICILLIN 875 MG PO TABS
875.0000 mg | ORAL_TABLET | Freq: Two times a day (BID) | ORAL | 0 refills | Status: AC
Start: 2022-10-31 — End: 2022-11-10

## 2022-10-31 NOTE — ED Provider Notes (Signed)
[name]    CSN: [id number] Arrival date & time: 10/31/22  0831      History   Chief Complaint Chief Complaint  Patient presents with   Headache   Facial Pain    HPI [name] is a 82 y.o. female.  Accompanied by her husband, patient presents with 1 week history of sinus pressure, postnasal drip, headache, sinus pain.  Treating symptoms with sinus medication, heating pad, Tylenol.  No fever, chills, cough, shortness of breath, or other symptoms.  Her medical history includes COPD, hypertension, NSTEMI, heart failure, CKD.  The history is provided by the spouse, the patient and medical records.    Past Medical History:  Diagnosis Date   Anxiety    Arthritis    CAD (coronary artery disease)    a. cardiac cath 10/2014: ostial LAD 30%, prox LAD 90% s/p PCI/DES, ostial RCA 30%, mid RCA 90% s/p PCI/DES, ostial Ramus 50%   Chronic back pain    Chronic fatigue    Chronic systolic CHF (congestive heart failure) (HCC)    a. 10/2014 Echo: EF 20%; b. 06/2017 Echo: EF 20-25%, diff HK, Gr2 DD, Mild AI. Mod to sev MR. Mod dil LA.   Hepatitis C    Ischemic cardiomyopathy    a. 10/2014 Echo: EF 20%; b. 06/2017 Echo: EF 20-25%.   LBBB (left bundle branch block)    Moderate to Severe Mitral regurgitation    a. 06/2017 Echo: Mod-Sev MR.   Osteoporosis    UTI (lower urinary tract infection)     Patient Active Problem List   Diagnosis Date Noted   Iron deficiency 08/02/2022   Need for pneumococcal vaccination 08/02/2022   Chronic, continuous use of opioids 08/02/2022   HFrEF (heart failure with reduced ejection fraction) (HCC) 08/02/2022   Mood disorder (HCC) 08/02/2022   Acute exacerbation of CHF (congestive heart failure) (HCC) 12/31/2021   Bilateral pleural effusion 12/31/2021   Elevated brain natriuretic peptide (BNP) level 12/31/2021   Elevated d-dimer 12/31/2021   Prolonged QT interval 12/31/2021   Elevated lipase 12/31/2021   Elevated MCV 12/31/2021   Iron  deficiency anemia 12/31/2021   At high risk for falls 12/13/2021   Physical deconditioning 12/13/2021   Aortic atherosclerosis (HCC) 11/26/2021   Acute kidney injury superimposed on chronic kidney disease (HCC) 06/13/2021   Hypokalemia 06/12/2021   Constipation 06/12/2021   Non-ST elevation (NSTEMI) myocardial infarction (HCC) 06/06/2021   Fall    Elevated troponin    Closed left hip fracture (HCC) 06/05/2021   Abnormal gait 03/01/2021   Cervicalgia 07/13/2020   Annual physical exam 06/27/2020   CKD stage 3b, GFR 30-44 ml/min (HCC) 07/08/2019   Prediabetes 07/08/2019   Rhinitis 05/14/2017   COPD (chronic obstructive pulmonary disease) (HCC) 05/14/2017   Dizziness 05/14/2017   Dysphagia 03/27/2017   Anxiety 03/17/2017   Runny nose 05/20/2016   Finger injury 04/19/2016   Mixed hyperlipidemia 03/24/2015   CAD (coronary artery disease)    Chronic fatigue    Chronic back pain    History of coronary artery stent placement    S/P repair of vertebral fracture 02/22/2014   History of hepatitis C 11/17/2013   Essential hypertension 11/17/2013   Osteoporosis 11/19/2012    Past Surgical History:  Procedure Laterality Date   BACK SURGERY  09/01/2012   T10, L1   BIV PACEMAKER INSERTION CRT-P N/A 01/02/2022   Procedure: BIV PACEMAKER INSERTION CRT-P;  Surgeon: Marinus Maw, MD;  Location: [facility];  Service: Cardiovascular;  Laterality: N/A;   CARDIAC CATHETERIZATION Bilateral 10/27/2014   Procedure: Left Heart Cath and Coronary Angiography;  Surgeon: Antonieta Iba, MD;  Location: [facility];  Service: Cardiovascular;  Laterality: Bilateral;   CARDIAC CATHETERIZATION N/A 10/27/2014   Procedure: Coronary Stent Intervention;  Surgeon: Iran Ouch, MD;  Location: [facility];  Service: Cardiovascular;  Laterality: N/A;   CORONARY PRESSURE/FFR STUDY N/A 06/13/2021   Procedure: INTRAVASCULAR PRESSURE WIRE/FFR STUDY;  Surgeon: Yvonne Kendall, MD;   Location: [facility];  Service: Cardiovascular;  Laterality: N/A;   CORONARY STENT INTERVENTION N/A 06/13/2021   Procedure: CORONARY STENT INTERVENTION;  Surgeon: Yvonne Kendall, MD;  Location: [facility];  Service: Cardiovascular;  Laterality: N/A;   HIP ARTHROPLASTY Left 06/07/2021   Procedure: ARTHROPLASTY BIPOLAR HIP (HEMIARTHROPLASTY);  Surgeon: Signa Kell, MD;  Location: [facility];  Service: Orthopedics;  Laterality: Left;   PACEMAKER IMPLANT  01/2022   RIGHT/LEFT HEART CATH AND CORONARY ANGIOGRAPHY N/A 06/13/2021   Procedure: RIGHT/LEFT HEART CATH AND CORONARY ANGIOGRAPHY;  Surgeon: Yvonne Kendall, MD;  Location: [facility];  Service: Cardiovascular;  Laterality: N/A;    OB History   No obstetric history on file.      Home Medications    Prior to Admission medications   Medication Sig Start Date End Date Taking? Authorizing Provider  amoxicillin (AMOXIL) 875 MG tablet Take 1 tablet (875 mg total) by mouth 2 (two) times daily for 10 days. 10/31/22 11/10/22 Yes Mickie Bail, NP  cetirizine (ZYRTEC) 10 MG tablet Take 1 tablet (10 mg total) by mouth daily. 07/24/22   Dana Allan, MD  ALPRAZolam Prudy Feeler) 0.25 MG tablet TAKE ONE TABLET BY MOUTH ONCE TO TWICE DAILY AS NEEDED Patient taking differently: Take 0.25 mg by mouth 2 (two) times daily as needed for anxiety. 06/25/21   McLean-Scocuzza, Pasty Spillers, MD  citalopram (CELEXA) 10 MG tablet Take 1 tablet (10 mg total) by mouth daily. Patient not taking: Reported on 08/28/2022 07/25/22   Dana Allan, MD  clopidogrel (PLAVIX) 75 MG tablet TAKE ONE TABLET BY MOUTH ONE TIME DAILY 10/23/22   Antonieta Iba, MD  denosumab (PROLIA) 60 MG/ML SOSY injection Inject 60 mg into the skin every 6 (six) months. 11/06/17   McLean-Scocuzza, Pasty Spillers, MD  metolazone (ZAROXOLYN) 2.5 MG tablet Take 1 tablet (2.5 mg total) by mouth daily. 09/30/22 12/29/22  Antonieta Iba, MD  metoprolol succinate (TOPROL-XL) 25 MG 24 hr tablet  TAKE ONE-HALF TABLET BY MOUTH ONE TIME DAILY 10/23/22   Antonieta Iba, MD  ondansetron (ZOFRAN-ODT) 4 MG disintegrating tablet TAKE 1 TABLET BY MOUTH TWICE A DAY AS NEEDED FOR NAUSEA AND VOMITING appt further refills no exceptions 06/25/21   McLean-Scocuzza, Pasty Spillers, MD  oxyCODONE (ROXICODONE) 5 MG immediate release tablet Take 1 tablet (5 mg total) by mouth daily as needed for severe pain. 07/16/22   Dana Allan, MD  rosuvastatin (CRESTOR) 40 MG tablet TAKE ONE TABLET BY MOUTH ONE TIME DAILY 10/28/22   Antonieta Iba, MD  torsemide (DEMADEX) 20 MG tablet Take 2 tablets (40 mg total) by mouth daily as needed. Take daily as needed for weight gain of 3 pounds. 09/30/22   Antonieta Iba, MD    Family History Family History  Problem Relation Age of Onset   Arthritis Mother    Heart disease Father        passed at 61   Diabetes Father  Social History Social History   Tobacco Use   Smoking status: Former    Current packs/day: 1.00    Average packs/day: 1 pack/day for 10.0 years (10.0 ttl pk-yrs)    Types: Cigarettes   Smokeless tobacco: Never  Vaping Use   Vaping status: Never Used  Substance Use Topics   Alcohol use: No    Alcohol/week: 0.0 standard drinks of alcohol   Drug use: No     Allergies   Ipratropium and Sugar-protein-starch   Review of Systems Review of Systems  Constitutional:  Negative for chills and fever.  HENT:  Positive for congestion, postnasal drip, rhinorrhea and sinus pressure. Negative for ear pain and sore throat.   Eyes:  Negative for visual disturbance.  Respiratory:  Negative for cough and shortness of breath.   Cardiovascular:  Negative for chest pain and palpitations.  Neurological:  Positive for headaches. Negative for dizziness, syncope, weakness and numbness.     Physical Exam Triage Vital Signs ED Triage Vitals  Encounter Vitals Group     BP      Systolic BP Percentile      Diastolic BP Percentile      Pulse      Resp       Temp      Temp src      SpO2      Weight      Height      Head Circumference      Peak Flow      Pain Score      Pain Loc      Pain Education      Exclude from Growth Chart    No data found.  Updated Vital Signs BP 135/86   Pulse 90   Temp 97.8 F (36.6 C)   Resp 18   SpO2 96%   Visual Acuity Right Eye Distance:   Left Eye Distance:   Bilateral Distance:    Right Eye Near:   Left Eye Near:    Bilateral Near:     Physical Exam Vitals and nursing note reviewed.  Constitutional:      General: She is not in acute distress.    Appearance: Normal appearance. She is well-developed.  HENT:     Right Ear: Tympanic membrane normal.     Left Ear: Tympanic membrane normal.     Nose: Congestion present.     Mouth/Throat:     Mouth: Mucous membranes are moist.     Pharynx: Oropharynx is clear.  Cardiovascular:     Rate and Rhythm: Normal rate and regular rhythm.     Heart sounds: Normal heart sounds.  Pulmonary:     Effort: Pulmonary effort is normal. No respiratory distress.     Breath sounds: Normal breath sounds.  Musculoskeletal:     Cervical back: Neck supple.  Skin:    General: Skin is warm and dry.  Neurological:     General: No focal deficit present.     Mental Status: She is alert and oriented to person, place, and time.     Sensory: No sensory deficit.     Motor: No weakness.     Gait: Gait normal.  Psychiatric:        Mood and Affect: Mood normal.        Behavior: Behavior normal.      UC Treatments / Results  Labs (all labs ordered are listed, but only abnormal results are displayed) Labs Reviewed - No data to display  EKG   Radiology No results found.  Procedures Procedures (including critical care time)  Medications Ordered in UC Medications - No data to display  Initial Impression / Assessment and Plan / UC Course  I have reviewed the triage vital signs and the nursing notes.  Pertinent labs & imaging results that were available  during my care of the patient were reviewed by me and considered in my medical decision making (see chart for details).    Acute sinusitis, headache.  Afebrile and vital signs are stable.  Treating with amoxicillin.  Tylenol as needed.  Instructed patient to follow-up with her PCP on Monday.  ED precautions given.  Education provided on sinus infection and headache.  Patient agrees to plan of care.  Final Clinical Impressions(s) / UC Diagnoses   Final diagnoses:  Acute non-recurrent maxillary sinusitis  Acute nonintractable headache, unspecified headache type     Discharge Instructions      Take the amoxicillin as directed for a sinus infection.  Follow-up with your primary care provider on Monday.  Go to the emergency department if you have worsening symptoms.     ED Prescriptions     Medication Sig Dispense Auth. Provider   amoxicillin (AMOXIL) 875 MG tablet Take 1 tablet (875 mg total) by mouth 2 (two) times daily for 10 days. 20 tablet Mickie Bail, NP      PDMP not reviewed this encounter.   Mickie Bail, NP 10/31/22 [phone]

## 2022-10-31 NOTE — Discharge Instructions (Addendum)
Take the amoxicillin as directed for a sinus infection.  Follow-up with your primary care provider on Monday.  Go to the emergency department if you have worsening symptoms.

## 2022-10-31 NOTE — Patient Outreach (Signed)
  Care Coordination   10/31/2022 Name: SARSHA HOFMANN MRN: [id number] DOB: 1940-03-09   Care Coordination Outreach Attempts:  Successful contact made with patient.  Patient states she is not available and agreeable to a return call at another time.   Follow Up Plan:  Additional outreach attempts will be made to offer the patient care coordination information and services.   Encounter Outcome:  No Answer   Care Coordination Interventions:  No, not indicated    George Ina [facility] Care Coordination [phone] direct line

## 2022-10-31 NOTE — ED Triage Notes (Signed)
Patient to Urgent Care with complaints of post nasal drip, posterior headache, and facial pain. Denies any known fevers.  Symptoms started three days ago. Has been using a heating pad and taking mapap sinus congestion and pain w/ tylenol.

## 2022-11-22 ENCOUNTER — Other Ambulatory Visit: Payer: Self-pay

## 2022-11-22 ENCOUNTER — Other Ambulatory Visit: Payer: Self-pay | Admitting: Family Medicine

## 2022-11-22 DIAGNOSIS — F419 Anxiety disorder, unspecified: Secondary | ICD-10-CM

## 2022-11-22 DIAGNOSIS — F39 Unspecified mood [affective] disorder: Secondary | ICD-10-CM

## 2022-11-22 MED ORDER — ALPRAZOLAM 0.25 MG PO TABS: 0.2500 mg | ORAL_TABLET | Freq: Every day | ORAL | 0 refills | Status: DC | PRN

## 2022-11-22 NOTE — Telephone Encounter (Signed)
Filled 06/25/2021 # 60 5 Refills LOV 07/16/2022 NOV Per Dr. Clent Ridges come back in 6 months

## 2022-11-26 ENCOUNTER — Telehealth: Payer: Self-pay | Admitting: Cardiovascular Disease

## 2022-11-26 NOTE — Telephone Encounter (Signed)
Pt c/o medication issue:  1. Name of Medication:   rosuvastatin (CRESTOR) 40 MG tablet    2. How are you currently taking this medication (dosage and times per day)?   TAKE ONE TABLET BY MOUTH ONE TIME DAILY    3. Are you having a reaction (difficulty breathing--STAT)? No  4. What is your medication issue? Pt states she has been having hot flashes from the above medication. She would like to know if the doctor can change the medication. Please advise

## 2022-11-26 NOTE — Telephone Encounter (Signed)
Called patient, she states that she has been having hot flashes and she believes it is coming from her Crestor. She states that she has been taking Crestor for years now, and has had hot flashes this long as well. She states she would like to switch to something else and see if this improves. Advised I would need to send a message over to MD to review and see if he recommended switching.   Thank you!

## 2022-11-28 ENCOUNTER — Other Ambulatory Visit: Payer: Self-pay | Admitting: Cardiovascular Disease

## 2022-11-28 NOTE — Telephone Encounter (Signed)
This would be an uncommon side effect of rosuvastatin. Would recommend she contact PCP to look for other causes, especially if it has been going on for years.

## 2022-11-28 NOTE — Telephone Encounter (Signed)
Patient is requesting call back to get update on this message. Please advise.

## 2022-11-28 NOTE — Telephone Encounter (Signed)
Pt made aware of pharmacist recommendations and stated she read that her symptoms could be caused by crestor. Pt stated she will await Dr. Windell Hummingbird recommendations.

## 2022-11-29 ENCOUNTER — Encounter: Payer: Self-pay | Admitting: Cardiovascular Disease

## 2022-11-29 NOTE — Telephone Encounter (Signed)
See telephone encounter.

## 2022-11-29 NOTE — Telephone Encounter (Signed)
Called and spoke with patient. Informed patient of the following recommendation from Dr. Mariah Milling.  Would recommend she hold the Crestor for 2 weeks and see if symptoms improve  Other alternatives include an injection medication, Repatha  Thx  TG  Patient verbalizes understanding. Patient states that she will take a 2 week break from Crestor. Then call the office to let us know if her symptoms have improved.

## 2022-12-21 NOTE — Progress Notes (Unsigned)
Date:  12/23/2022   ID:  [name], DOB 01/17/1941, MRN [id number]  Patient Location:  [address] [place] [address]   Provider location:   [facility], [place] office  PCP:  Dana Allan, MD  Cardiologist:  [facility]  Chief Complaint  Patient presents with   6 month follow up     "Doing well." Medications reviewed by the patient verbally.     History of Present Illness:    [name] is a 82 y.o. female  past medical history of hepatitis C,  osteoporosis,  arthritis,  chronic back pain s/p multiple fusions,  chronic UTI's,  CAD, ischemic cardiomyopathy with ejection fraction 25% on echocardiogram Mitral valve: There was moderate to severe regurgitation November 2016, PCI to the mid RCA,  proximal LAD August 2016,  Significant medication intolerances Previously not interested in echocardiogram EF in 06/2017, 20 to 25% LBBB PCI of LAD prox and mid 06/2021 who presents for routine follow-up of her cardiomyopathy, CAD  Last seen by myself in clinic January 2024 Seen by EP February 2024  CRT-P implanted 11/23 with a left bundle and LV lead system.  Off midodrine  On further discussion today she reports that she feels well with no complaints Does not go outside the house much apart from into the car She sits in the car while husband does the shopping Has a new scooter but has not used it yet No regular walking or exercise program Denies significant chest pressure, no shortness of breath, no leg swelling Reports chronic runny nose  Stays on torsemide 20 daily Does not take metolazone She is not on Celexa  EKG personally reviewed by myself on todays visit EKG Interpretation Date/Time:  Monday December 23 2022 10:26:44 EDT Ventricular Rate:  80 PR Interval:  142 QRS Duration:  150 QT Interval:  442 QTC Calculation: 509 R Axis:   -4  Text Interpretation: Atrial-sensed ventricular-paced rhythm with occasional  Premature ventricular complexes When compared with ECG of 03-Jan-2022 05:29, Vent. rate has decreased BY   8 BPM Confirmed by Julien Nordmann [phone]) on 12/23/2022 10:30:21 AM   Other past medical history reviewed Admitted to [facility]  10/23 with acute CHF.   BNP greater than 4500.   Echo showed EF less than 20%, LV severely dilated plus severe MR.  Mitral valve appeared rheumatic or " postinflammatory" tissue.  RVSP moderately elevated at 55 mmHg, RV mildly reduced, mild to moderate AI and mild to moderate TR.    diuresed with IV Lasix and underwent Saint Jude CRT-P for left bundle/AV block and chronic systolic heart failure.  A pacemaker was placed due to patient's preference.    Guideline directed medical therapy was limited by hypotension requiring midodrine.  Decision made to not start SGLT2 inhibitor given risk for GU infection (prior history of UTIs) and new pacemaker.    seen by advanced heart failure team 01/16/2022.  on midodrine 2.5 mg 3 times daily.  Midodrine was stopped due to SBPs in the 130s.   on Toprol  daily.  She is requesting no further medication changes   MitraClip was previously discussed, however patient would not want any further procedures/interventions.  Declining repeat echocardiogram on today's visit  BNP came back >4500 and torsemide was increased to 40mg  daily.   In follow-up today, reports she is Only taking torsemide 20 mg daily Torsemide 40 mg daily makes her constipated Belly feels ok, denies leg edema Minimally active  at home, walks from chairs to kitchen, does not go outside the house very much Weight at home 114 actually down 5 pounds from November 2023  Echocardiogram January 01, 2022 EF estimated less than 20%, mildly decreased RV function with right ventricular systolic pressure estimated 55 mmHg, severely dilated left atrium  Reports that she had a fall April 2023, suffered injury to her hip Hip surgery April 2023,  Cardiac cath with PCI  x2 Successful PCI to proximal LAD using Onyx Frontier 3.0 x 12 mm drug-eluting stent (jailing D1) with 0% residual stenosis and TIMI-3 flow. Successful PCI to mid LAD using Onyx Frontier 2.5 x 15 mm drug-eluting stent (jailing D2) with angioplasty of ostium of D2.  There is 0% residual stenosis in the LAD and 80% residual stenosis at the ostium of D2 with TIMI-3 flow.  Echo 4/23   1. Left ventricular ejection fraction, by estimation, is 25 to 30%. The  left ventricle has severely decreased function. The left ventricle  demonstrates global hypokinesis. The left ventricular internal cavity size  was mildly dilated. There is moderate  asymmetric left ventricular hypertrophy of the septal segment. Left  ventricular diastolic parameters are consistent with Grade II diastolic  dysfunction (pseudonormalization).   2. Right ventricular systolic function is low normal. The right  ventricular size is mildly enlarged.   3. Left atrial size was moderately dilated.   4. The mitral valve is normal in structure. Moderate mitral valve  regurgitation.   5. The aortic valve is tricuspid. Aortic valve regurgitation is trivial.   Seen in urgent care November 11, 2021 increasing "shortness of breath" since hip surgery April 2023.  She denies having shortness of breath, reports it was hoarseness It was recommended she go to the emergency room She refused follow-up in the emergency room   cardiac cath on 8/25 that showed ostial LAD 30%, prox LAD 90%, ostial RCA 30%, mid RCA 90%, ostial Ramus 50%.  She underwent successful PCI/DES to the proximal LAD and mid RCA with Xience DES. She was started on aspirin and Brilinta.    she could not tolerated Brilinta 2/2 SOB. She was changed to Plavix.      Past Medical History:  Diagnosis Date   Anxiety    Arthritis    CAD (coronary artery disease)    a. cardiac cath 10/2014: ostial LAD 30%, prox LAD 90% s/p PCI/DES, ostial RCA 30%, mid RCA 90% s/p PCI/DES, ostial  Ramus 50%   Chronic back pain    Chronic fatigue    Chronic systolic CHF (congestive heart failure) (HCC)    a. 10/2014 Echo: EF 20%; b. 06/2017 Echo: EF 20-25%, diff HK, Gr2 DD, Mild AI. Mod to sev MR. Mod dil LA.   Hepatitis C    Ischemic cardiomyopathy    a. 10/2014 Echo: EF 20%; b. 06/2017 Echo: EF 20-25%.   LBBB (left bundle branch block)    Moderate to Severe Mitral regurgitation    a. 06/2017 Echo: Mod-Sev MR.   Osteoporosis    UTI (lower urinary tract infection)    Past Surgical History:  Procedure Laterality Date   BACK SURGERY  09/01/2012   T10, L1   BIV PACEMAKER INSERTION CRT-P N/A 01/02/2022   Procedure: BIV PACEMAKER INSERTION CRT-P;  Surgeon: Marinus Maw, MD;  Location: [facility] INVASIVE CV LAB;  Service: Cardiovascular;  Laterality: N/A;   CARDIAC CATHETERIZATION Bilateral 10/27/2014   Procedure: Left Heart Cath and Coronary Angiography;  Surgeon: Antonieta Iba, MD;  Location:  [facility] INVASIVE CV LAB;  Service: Cardiovascular;  Laterality: Bilateral;   CARDIAC CATHETERIZATION N/A 10/27/2014   Procedure: Coronary Stent Intervention;  Surgeon: Iran Ouch, MD;  Location: [facility] INVASIVE CV LAB;  Service: Cardiovascular;  Laterality: N/A;   CORONARY PRESSURE/FFR STUDY N/A 06/13/2021   Procedure: INTRAVASCULAR PRESSURE WIRE/FFR STUDY;  Surgeon: Yvonne Kendall, MD;  Location: [facility] INVASIVE CV LAB;  Service: Cardiovascular;  Laterality: N/A;   CORONARY STENT INTERVENTION N/A 06/13/2021   Procedure: CORONARY STENT INTERVENTION;  Surgeon: Yvonne Kendall, MD;  Location: [facility] INVASIVE CV LAB;  Service: Cardiovascular;  Laterality: N/A;   HIP ARTHROPLASTY Left 06/07/2021   Procedure: ARTHROPLASTY BIPOLAR HIP (HEMIARTHROPLASTY);  Surgeon: Signa Kell, MD;  Location: [facility] ORS;  Service: Orthopedics;  Laterality: Left;   PACEMAKER IMPLANT  01/2022   RIGHT/LEFT HEART CATH AND CORONARY ANGIOGRAPHY N/A 06/13/2021   Procedure: RIGHT/LEFT HEART CATH AND CORONARY ANGIOGRAPHY;  Surgeon:  Yvonne Kendall, MD;  Location: [facility] INVASIVE CV LAB;  Service: Cardiovascular;  Laterality: N/A;     Current Meds  Medication Sig   ALPRAZolam (XANAX) 0.25 MG tablet Take 1 tablet (0.25 mg total) by mouth daily as needed for anxiety.   cetirizine (ZYRTEC) 10 MG tablet Take 1 tablet (10 mg total) by mouth daily.   citalopram (CELEXA) 10 MG tablet Take 1 tablet (10 mg total) by mouth daily.   clopidogrel (PLAVIX) 75 MG tablet TAKE ONE TABLET BY MOUTH ONE TIME DAILY   metoprolol succinate (TOPROL-XL) 25 MG 24 hr tablet TAKE ONE-HALF TABLET BY MOUTH ONE TIME DAILY   ondansetron (ZOFRAN-ODT) 4 MG disintegrating tablet TAKE 1 TABLET BY MOUTH TWICE A DAY AS NEEDED FOR NAUSEA AND VOMITING appt further refills no exceptions   oxyCODONE (ROXICODONE) 5 MG immediate release tablet Take 1 tablet (5 mg total) by mouth daily as needed for severe pain.   rosuvastatin (CRESTOR) 40 MG tablet TAKE ONE TABLET BY MOUTH ONE TIME DAILY   torsemide (DEMADEX) 20 MG tablet Take 20 mg by mouth daily.     Allergies:   Ipratropium and Sugar-protein-starch   Social History   Tobacco Use   Smoking status: Former    Current packs/day: 1.00    Average packs/day: 1 pack/day for 10.0 years (10.0 ttl pk-yrs)    Types: Cigarettes   Smokeless tobacco: Never  Vaping Use   Vaping status: Never Used  Substance Use Topics   Alcohol use: No    Alcohol/week: 0.0 standard drinks of alcohol   Drug use: No      Family Hx: The patient's family history includes Arthritis in her mother; Diabetes in her father; Heart disease in her father.  ROS:   Please see the history of present illness.    Review of Systems  Constitutional: Negative.   HENT: Negative.    Respiratory: Negative.    Cardiovascular: Negative.   Gastrointestinal: Negative.   Musculoskeletal: Negative.        Leg weakness  Neurological: Negative.   Psychiatric/Behavioral: Negative.    All other systems reviewed and are negative.   Labs/Other Tests  and Data Reviewed:    Recent Labs: 01/03/2022: Magnesium 2.1 01/16/2022: B Natriuretic Peptide >4,500.0 07/16/2022: ALT 18; BUN 31; Creatinine, Ser 1.49; Hemoglobin 14.4; Platelets 193.0; Potassium 4.6; Sodium 143; TSH 2.30   Recent Lipid Panel Lab Results  Component Value Date/Time   CHOL 168 07/16/2022 03:15 PM   CHOL 176 05/08/2021 03:25 PM   TRIG 274.0 (H) 07/16/2022 03:15 PM   HDL 45.20 07/16/2022 03:15 PM  HDL 48 05/08/2021 03:25 PM   CHOLHDL 4 07/16/2022 03:15 PM   LDLCALC 42 01/02/2022 01:10 AM   LDLCALC 81 05/08/2021 03:25 PM   LDLDIRECT 72.0 07/16/2022 03:15 PM    Wt Readings from Last 3 Encounters:  12/23/22 119 lb 2 oz (54 kg)  08/28/22 118 lb (53.5 kg)  07/16/22 120 lb (54.4 kg)     Exam:    Vital Signs: Vital signs may also be detailed in the HPI BP 110/60 (BP Location: Left Arm, Patient Position: Sitting, Cuff Size: Normal)   Pulse 80   Ht 5' (1.524 m)   Wt 119 lb 2 oz (54 kg)   SpO2 96%   BMI 23.27 kg/m   Constitutional:  oriented to person, place, and time. No distress.  HENT:  Head: Grossly normal Eyes:  no discharge. No scleral icterus.  Neck: No JVD, no carotid bruits  Cardiovascular: Regular rate and rhythm, no murmurs appreciated Pulmonary/Chest: Clear to auscultation bilaterally, no wheezes or rails Abdominal: Soft.  no distension.  no tenderness.  Musculoskeletal: Normal range of motion Neurological:  normal muscle tone. Coordination normal. No atrophy Skin: Skin warm and dry Psychiatric: normal affect, pleasant  ASSESSMENT & PLAN:    Coronary artery disease of native artery of native heart with stable angina pectoris (HCC) -  Currently with no symptoms of angina. No further workup at this time. Continue current medication regimen. History of stent x2 to proximal and mid LAD  on Plavix statin beta-blocker  Ischemic cardiomyopathy -chronic systolic CHF EF 20%, Previously declined repeat echo or ICD, has pacemaker Long history of  medication reluctance Tolerating metoprolol succinate, torsemide, Crestor Off midodrine, blood pressure stable SGLT2 inhibitor previously not initiated secondary to concern for UTIs  Mixed hyperlipidemia -  Continue Crestor 40 daily Cholesterol at close to goal  Essential hypertension -  Blood pressure often running low, off midodrine, denies orthostasis  Debility Legs weak, unable to walk very far, husband primary caretaker Needs assistance for transferring from chair to chair Has a scooter but has not used it yet History of fall April 2023 with trauma to the hip requiring surgery  Anxiety -  Managed by primary care  Pacer Has pacer download February, Followed by EP  Runny nose/rhinitis Recommend she try fluticasone nasal spray    Signed, Julien Nordmann, MD  12/23/2022 10:45 AM    [facility] [address], [place], [place] [address]

## 2022-12-23 ENCOUNTER — Ambulatory Visit: Payer: Medicare HMO | Attending: Cardiovascular Disease | Admitting: Cardiovascular Disease

## 2022-12-23 ENCOUNTER — Encounter: Payer: Self-pay | Admitting: Cardiovascular Disease

## 2022-12-23 VITALS — BP 110/60 | HR 80 | Ht 60.0 in | Wt 119.1 lb

## 2022-12-23 DIAGNOSIS — Z79899 Other long term (current) drug therapy: Secondary | ICD-10-CM

## 2022-12-23 DIAGNOSIS — I5022 Chronic systolic (congestive) heart failure: Secondary | ICD-10-CM

## 2022-12-23 DIAGNOSIS — I340 Nonrheumatic mitral (valve) insufficiency: Secondary | ICD-10-CM

## 2022-12-23 DIAGNOSIS — I5042 Chronic combined systolic (congestive) and diastolic (congestive) heart failure: Secondary | ICD-10-CM

## 2022-12-23 DIAGNOSIS — I10 Essential (primary) hypertension: Secondary | ICD-10-CM

## 2022-12-23 DIAGNOSIS — I447 Left bundle-branch block, unspecified: Secondary | ICD-10-CM | POA: Diagnosis not present

## 2022-12-23 DIAGNOSIS — I255 Ischemic cardiomyopathy: Secondary | ICD-10-CM

## 2022-12-23 DIAGNOSIS — E782 Mixed hyperlipidemia: Secondary | ICD-10-CM | POA: Diagnosis not present

## 2022-12-23 DIAGNOSIS — E785 Hyperlipidemia, unspecified: Secondary | ICD-10-CM | POA: Diagnosis not present

## 2022-12-23 DIAGNOSIS — I2510 Atherosclerotic heart disease of native coronary artery without angina pectoris: Secondary | ICD-10-CM

## 2022-12-23 NOTE — Patient Instructions (Addendum)
Medication Instructions:  No changes  Try generic of flonase nassal spray Fluticasone nasal spray   If you need a refill on your cardiac medications before your next appointment, please call your pharmacy.   Lab work: [employer] today  Testing/Procedures: No new testing needed  Follow-Up: At [facility], you and your health needs are our priority.  As part of our continuing mission to provide you with exceptional heart care, we have created designated Provider Care Teams.  These Care Teams include your primary Cardiologist (physician) and Advanced Practice Providers (APPs -  Physician Assistants and Nurse Practitioners) who all work together to provide you with the care you need, when you need it.  You will need a follow up appointment in 12 months  Providers on your designated Care Team:   Nicolasa Ducking, NP Eula Listen, PA-C Cadence Fransico Michael, [place]  COVID-19 Vaccine Information can be found at: [email or web address] For questions related to vaccine distribution or appointments, please email [email or web address] or call [phone].

## 2022-12-24 ENCOUNTER — Encounter: Payer: Self-pay | Admitting: Cardiovascular Disease

## 2022-12-24 LAB — BASIC METABOLIC PANEL
BUN/Creatinine Ratio: 27 (ref 12–28)
BUN: 31 mg/dL — ABNORMAL HIGH (ref 8–27)
CO2: 23 mmol/L (ref 20–29)
Calcium: 10.3 mg/dL (ref 8.7–10.3)
Chloride: 101 mmol/L (ref 96–106)
Creatinine, Ser: 1.16 mg/dL — ABNORMAL HIGH (ref 0.57–1.00)
Glucose: 84 mg/dL (ref 70–99)
Potassium: 5.3 mmol/L — ABNORMAL HIGH (ref 3.5–5.2)
Sodium: 140 mmol/L (ref 134–144)
eGFR: 47 mL/min/{1.73_m2} — ABNORMAL LOW (ref >59)

## 2022-12-25 ENCOUNTER — Telehealth: Payer: Self-pay | Admitting: *Deleted

## 2022-12-25 ENCOUNTER — Encounter: Payer: Self-pay | Admitting: Cardiovascular Disease

## 2022-12-25 NOTE — Telephone Encounter (Signed)
$  321 due; PA on file-only good until 01/26/23

## 2023-01-02 ENCOUNTER — Ambulatory Visit (INDEPENDENT_AMBULATORY_CARE_PROVIDER_SITE_OTHER): Payer: Medicare HMO

## 2023-01-02 DIAGNOSIS — I255 Ischemic cardiomyopathy: Secondary | ICD-10-CM | POA: Diagnosis not present

## 2023-01-02 LAB — CUP PACEART REMOTE DEVICE CHECK
Battery Remaining Longevity: 78 mo
Battery Remaining Percentage: 87 %
Battery Voltage: 2.99 V
Brady Statistic AP VP Percent: 8.2 %
Brady Statistic AP VS Percent: 1 %
Brady Statistic AS VP Percent: 89 %
Brady Statistic AS VS Percent: 1 %
Brady Statistic RA Percent Paced: 5.8 %
Date Time Interrogation Session: 20241031030035
Implantable Lead Connection Status: 753985
Implantable Lead Connection Status: 753985
Implantable Lead Connection Status: 753985
Implantable Lead Implant Date: 20231101
Implantable Lead Implant Date: 20231101
Implantable Lead Implant Date: 20231101
Implantable Lead Location: 753858
Implantable Lead Location: 753859
Implantable Lead Location: 753860
Implantable Pulse Generator Implant Date: 20231101
Lead Channel Impedance Value: 350 Ohm
Lead Channel Impedance Value: 430 Ohm
Lead Channel Impedance Value: 890 Ohm
Lead Channel Pacing Threshold Amplitude: 0.5 V
Lead Channel Pacing Threshold Amplitude: 0.625 V
Lead Channel Pacing Threshold Amplitude: 1.25 V
Lead Channel Pacing Threshold Pulse Width: 0.5 ms
Lead Channel Pacing Threshold Pulse Width: 0.5 ms
Lead Channel Pacing Threshold Pulse Width: 0.5 ms
Lead Channel Sensing Intrinsic Amplitude: 11.5 mV
Lead Channel Sensing Intrinsic Amplitude: 2 mV
Lead Channel Setting Pacing Amplitude: 2 V
Lead Channel Setting Pacing Amplitude: 2 V
Lead Channel Setting Pacing Amplitude: 2.5 V
Lead Channel Setting Pacing Pulse Width: 0.5 ms
Lead Channel Setting Pacing Pulse Width: 0.5 ms
Lead Channel Setting Sensing Sensitivity: 2 mV
Pulse Gen Model: 3562
Pulse Gen Serial Number: 8124818

## 2023-01-13 MED ORDER — DENOSUMAB 60 MG/ML ~~LOC~~ SOSY
60.0000 mg | PREFILLED_SYRINGE | Freq: Once | SUBCUTANEOUS | Status: AC
Start: 2023-01-17 — End: 2023-01-17
  Administered 2023-01-17: 60 mg via SUBCUTANEOUS

## 2023-01-13 NOTE — Addendum Note (Signed)
Addended by: Warden Fillers on: 01/13/2023 10:53 AM   Modules accepted: Orders

## 2023-01-15 ENCOUNTER — Other Ambulatory Visit: Payer: Self-pay | Admitting: Cardiovascular Disease

## 2023-01-15 NOTE — Progress Notes (Signed)
Remote pacemaker transmission.   

## 2023-01-17 ENCOUNTER — Ambulatory Visit (INDEPENDENT_AMBULATORY_CARE_PROVIDER_SITE_OTHER): Payer: Medicare HMO

## 2023-01-17 DIAGNOSIS — M810 Age-related osteoporosis without current pathological fracture: Secondary | ICD-10-CM

## 2023-01-17 MED ORDER — DENOSUMAB 60 MG/ML ~~LOC~~ SOSY
60.0000 mg | PREFILLED_SYRINGE | Freq: Once | SUBCUTANEOUS | Status: AC
Start: 2023-07-17 — End: 2023-07-25
  Administered 2023-07-25: 60 mg via SUBCUTANEOUS

## 2023-01-17 NOTE — Progress Notes (Signed)
Patient presented for Prolia  injection to left arm, patient voiced no concerns nor showed any signs of distress during injection  

## 2023-01-21 ENCOUNTER — Encounter: Payer: Medicare HMO | Admitting: Cardiology

## 2023-02-20 DIAGNOSIS — L821 Other seborrheic keratosis: Secondary | ICD-10-CM | POA: Diagnosis not present

## 2023-02-20 DIAGNOSIS — L570 Actinic keratosis: Secondary | ICD-10-CM | POA: Diagnosis not present

## 2023-03-11 NOTE — Progress Notes (Signed)
 Electrophysiology Clinic Note    Date:  03/12/2023  Patient ID:  [name], DOB January 31, 1941, MRN [id number] PCP:  Hope Merle, MD  Cardiologist:  Evalene Lunger, MD Electrophysiologist: Elspeth Sage, MD   Discussed the use of AI scribe software for clinical note transcription with the patient, who gave verbal consent to proceed.   Patient Profile    Chief Complaint: routine PPM follow-up  History of Present Illness: [name] is a 82 y.o. female with PMH notable for ICM, HFrEF, CRT-P, CAD s/p PCI, mod-severe MR, HTN; seen today for Elspeth Sage, MD for routine electrophysiology followup.  She last saw Dr. Sage 04/2022 for routine 91d PPM implant, she was euvolemic.  On follow-up today, she is very limited in her physical capacity by back pain and cracked vertebra, needs extensive assistance from husband to walk and with ADLs. She has increased swelling, especially in L foot. Continues to take toresemide 20mg  daily. She denies chest pain, chest pressure. Has some SOB, but is not new or worsening. Weight is stable by home scale at ~116/117lbs She does not like the appearance of her PPM site, denies pain, redness, drainage.  She does not check her BP at home, but does have a cuff   Device Information: St. Jude CRT-P, imp 01/2022; dx ICM, LBBB, 2nd deg HB    ROS:  Please see the history of present illness. All other systems are reviewed and otherwise negative.    Physical Exam    VS:  BP 138/60 (BP Location: Left Arm, Patient Position: Sitting, Cuff Size: Normal)   Pulse 89   Ht 5' (1.524 m)   Wt 118 lb 8 oz (53.8 kg)   SpO2 97%   BMI 23.14 kg/m  BMI: Body mass index is 23.14 kg/m.  Wt Readings from Last 3 Encounters:  03/12/23 118 lb 8 oz (53.8 kg)  12/23/22 119 lb 2 oz (54 kg)  08/28/22 118 lb (53.5 kg)     GEN- The patient is well appearing, alert and oriented x 3 today.   Lungs- Clear to ausculation bilaterally, normal work of breathing.  Heart-  Regular rate and rhythm, no murmurs, rubs or gallops Extremities- 1+ L>R pedal edema, warm, dry Skin-  device pocket well-healed, no tethering   Device interrogation done today and reviewed by myself:  Battery 6.6 years Lead thresholds, impedence, sensing stable  Brief PMT episodes noted Adjusted thresholds to auto  Studies Reviewed   Previous EP, cardiology notes.    EKG is ordered. Patient refused updated EKG  EKG from 12/23/2022 shows: AS-VP at 80bpm PR QRS       TTE, 01/01/2022  1. Left ventricular ejection fraction, by estimation, is <20%. The left ventricle has severely decreased function. The left ventricle demonstrates global hypokinesis. The left ventricular internal cavity size was severely dilated. There is mild asymmetric left ventricular hypertrophy. Left ventricular diastolic parameters are indeterminate. There is no LV thrombus.   2. Right ventricular systolic function is mildly reduced. The right ventricular size is normal. There is moderately elevated pulmonary artery systolic pressure. The estimated right ventricular systolic pressure is 55.7 mmHg.   3. Left atrial size was severely dilated.   4. A small pericardial effusion is present. The pericardial effusion is anterior to the right ventricle and posterior to the left ventricle.   5. The mitral valve is rheumatic or post inflammatory tissue. Severe mitral valve regurgitation. No evidence of mitral stenosis. The mean mitral valve gradient  is 5.0 mmHg. Central ventricular MR with A2-P2 worst pathology and pulmonary vein flow reversal.   6. Tricuspid valve regurgitation is mild to moderate.   7. The aortic valve is tricuspid. There is mild thickening of the aortic valve. Aortic valve regurgitation is mild to moderate. No aortic stenosis is present.   Comparison(s): Further decrease in LV size and function; increase in MR.   R/LHC, 06/13/2021 Conclusions: Severe single-vessel coronary artery disease  with sequential 80-90% proximal/mid LAD stenoses flanking previously placed, which are highly significant by iFR and involve D1 and D2. Mild-moderate, nonobstructive CAD involving ramus intermedius and RCA. Widely patent mid LAD and mid RCA stents. Mildly elevated left and right heart filling pressures. Mild-moderate pulmonary hypertension. Mildly-moderately reduced Fick cardiac output/index. Successful PCI to proximal LAD using Onyx Frontier 3.0 x 12 mm drug-eluting stent (jailing D1) with 0% residual stenosis and TIMI-3 flow. Successful PCI to mid LAD using Onyx Frontier 2.5 x 15 mm drug-eluting stent (jailing D2) with angioplasty of ostium of D2.  There is 0% residual stenosis in the LAD and 80% residual stenosis at the ostium of D2 with TIMI-3 flow. Challenging intervention via right radial approach due to tortuosity of the right subclavian artery and radial artery vasospasm.  Consider alternative access for future catheterizations.   Recommendations: Dual antiplatelet therapy with aspirin  and clopidogrel  for at least 12 months. Aggressive secondary prevention of coronary artery disease. Continued gentle diuresis. Escalate goal-directed medical therapy for acute on chronic HFrEF.  TTE, 06/06/2021  1. Left ventricular ejection fraction, by estimation, is 25 to 30%. The left ventricle has severely decreased function. The left ventricle demonstrates global hypokinesis. The left ventricular internal cavity size was mildly dilated. There is moderate asymmetric left ventricular hypertrophy of the septal segment. Left ventricular diastolic parameters are consistent with Grade II diastolic dysfunction (pseudonormalization).   2. Right ventricular systolic function is low normal. The right ventricular size is mildly enlarged.   3. Left atrial size was moderately dilated.   4. The mitral valve is normal in structure. Moderate mitral valve regurgitation.   5. The aortic valve is tricuspid. Aortic valve  regurgitation is trivial.      Assessment and Plan     #) ICM #) CRT-P in situ Device functioning well, see paceart for details High VP Consider updating timing for a narrower QRS in future, patient not agreeable to a longer OV today  #) HFrEF Activity severely limited by back pain CorVue stable, though patient notes increased lower extremity edema Weight stable by home scale Update TTE with CRT system now in place. Patient only consents to female echo techs Recommended she check BP more regularly at home to guide GDMT up-titration She refuses referral to HF team Update BMP, Mag today to guide whether to initiate spiro vs jardiance  - update labwork in 2 weeks        Current medicines are reviewed at length with the patient today.   The patient has concerns regarding her medicines.  The following changes were made today:   Will add GDMT (spiro vs jardiance ) depending on lab results from today  Labs/ tests ordered today include:  Orders Placed This Encounter  Procedures   Basic Metabolic Panel (BMET)   Magnesium    B Nat Peptide   Basic Metabolic Panel (BMET)   Magnesium    ECHOCARDIOGRAM COMPLETE     Disposition: Follow up with Dr. Fernande or EP APP in 6 months  Follow-up with Dr. Gollan or Gen cards APP in 2  months to further increase HF GDMT   Signed, Chantal Needle, NP  03/12/23  1:09 PM  Electrophysiology [facility]

## 2023-03-12 ENCOUNTER — Other Ambulatory Visit: Payer: Self-pay

## 2023-03-12 ENCOUNTER — Ambulatory Visit: Payer: HMO | Attending: Cardiology | Admitting: Cardiology

## 2023-03-12 ENCOUNTER — Encounter: Payer: Self-pay | Admitting: Cardiology

## 2023-03-12 VITALS — BP 138/60 | HR 89 | Ht 60.0 in | Wt 118.5 lb

## 2023-03-12 DIAGNOSIS — I5042 Chronic combined systolic (congestive) and diastolic (congestive) heart failure: Secondary | ICD-10-CM | POA: Diagnosis not present

## 2023-03-12 DIAGNOSIS — Z950 Presence of cardiac pacemaker: Secondary | ICD-10-CM

## 2023-03-12 DIAGNOSIS — I255 Ischemic cardiomyopathy: Secondary | ICD-10-CM | POA: Diagnosis not present

## 2023-03-12 LAB — CUP PACEART INCLINIC DEVICE CHECK
Battery Remaining Longevity: 79 mo
Battery Voltage: 2.99 V
Brady Statistic RA Percent Paced: 5.6 %
Brady Statistic RV Percent Paced: 98 %
Date Time Interrogation Session: 20250108140953
Implantable Lead Connection Status: 753985
Implantable Lead Connection Status: 753985
Implantable Lead Connection Status: 753985
Implantable Lead Implant Date: 20231101
Implantable Lead Implant Date: 20231101
Implantable Lead Implant Date: 20231101
Implantable Lead Location: 753858
Implantable Lead Location: 753859
Implantable Lead Location: 753860
Implantable Pulse Generator Implant Date: 20231101
Lead Channel Impedance Value: 325 Ohm
Lead Channel Impedance Value: 425 Ohm
Lead Channel Impedance Value: 962.5 Ohm
Lead Channel Pacing Threshold Amplitude: 0.5 V
Lead Channel Pacing Threshold Amplitude: 0.5 V
Lead Channel Pacing Threshold Amplitude: 0.5 V
Lead Channel Pacing Threshold Amplitude: 0.5 V
Lead Channel Pacing Threshold Amplitude: 1.125 V
Lead Channel Pacing Threshold Amplitude: 1.25 V
Lead Channel Pacing Threshold Pulse Width: 0.5 ms
Lead Channel Pacing Threshold Pulse Width: 0.5 ms
Lead Channel Pacing Threshold Pulse Width: 0.5 ms
Lead Channel Pacing Threshold Pulse Width: 0.5 ms
Lead Channel Pacing Threshold Pulse Width: 0.5 ms
Lead Channel Pacing Threshold Pulse Width: 0.5 ms
Lead Channel Sensing Intrinsic Amplitude: 4 mV
Lead Channel Sensing Intrinsic Amplitude: 8.7 mV
Lead Channel Setting Pacing Amplitude: 1.625
Lead Channel Setting Pacing Amplitude: 2 V
Lead Channel Setting Pacing Amplitude: 2.125
Lead Channel Setting Pacing Pulse Width: 0.5 ms
Lead Channel Setting Pacing Pulse Width: 0.5 ms
Lead Channel Setting Sensing Sensitivity: 2 mV
Pulse Gen Model: 3562
Pulse Gen Serial Number: 8124818

## 2023-03-12 NOTE — Patient Instructions (Signed)
 Medication Instructions:   Your physician recommends that you continue on your current medications as directed. Please refer to the Current Medication list given to you today.  *If you need a refill on your cardiac medications before your next appointment, please call your pharmacy*   Lab Work:  BMP / MAG / BNP -- today  Your provider would like for you to return in at the time of your echocardiogram to have the following labs drawn: BMP / MAG.   Please go to [facility] [address] ([facility]) [address], [place] [address] You do not need an appointment.  They are open from 8 am- 5 pm.  Lunch from 1:00 pm- 2:00 pm You DO NOT need to be fasting.   If you have labs (blood work) drawn today and your tests are completely normal, you will receive your results only by: MyChart Message (if you have MyChart) OR A paper copy in the mail If you have any lab test that is abnormal or we need to change your treatment, we will call you to review the results.   Testing/Procedures:  Your physician has requested that you have an echocardiogram -- female tech only . Echocardiography is a painless test that uses sound waves to create images of your heart. It provides your doctor with information about the size and shape of your heart and how well your heart's chambers and valves are working. This procedure takes approximately one hour. There are no restrictions for this procedure. Please do NOT wear cologne, perfume, aftershave, or lotions (deodorant is allowed). Please arrive 15 minutes prior to your appointment time.  Please note: We ask at that you not bring children with you during ultrasound (echo/ vascular) testing. Due to room size and safety concerns, children are not allowed in the ultrasound rooms during exams. Our front office staff cannot provide observation of children in our lobby area while testing is being conducted. An adult accompanying a patient to their  appointment will only be allowed in the ultrasound room at the discretion of the ultrasound technician under special circumstances. We apologize for any inconvenience.    Follow-Up: At [facility], you and your health needs are our priority.  As part of our continuing mission to provide you with exceptional heart care, we have created designated Provider Care Teams.  These Care Teams include your primary Cardiologist (physician) and Advanced Practice Providers (APPs -  Physician Assistants and Nurse Practitioners) who all work together to provide you with the care you need, when you need it.  We recommend signing up for the patient portal called MyChart.  Sign up information is provided on this After Visit Summary.  MyChart is used to connect with patients for Virtual Visits (Telemedicine).  Patients are able to view lab/test results, encounter notes, upcoming appointments, etc.  Non-urgent messages can be sent to your provider as well.   To learn more about what you can do with MyChart, go to [email or web address].    Your next appointment:   2 month(s)  Provider:   Timothy Gollan, MD ONLY   You will need to see Dr. Fernande in about 6 months.

## 2023-03-13 ENCOUNTER — Other Ambulatory Visit: Payer: Self-pay | Admitting: *Deleted

## 2023-03-13 LAB — BASIC METABOLIC PANEL
BUN/Creatinine Ratio: 22 (ref 12–28)
BUN: 22 mg/dL (ref 8–27)
CO2: 24 mmol/L (ref 20–29)
Calcium: 10.7 mg/dL — ABNORMAL HIGH (ref 8.7–10.3)
Chloride: 101 mmol/L (ref 96–106)
Creatinine, Ser: 1.02 mg/dL — ABNORMAL HIGH (ref 0.57–1.00)
Glucose: 95 mg/dL (ref 70–99)
Potassium: 5.6 mmol/L — ABNORMAL HIGH (ref 3.5–5.2)
Sodium: 144 mmol/L (ref 134–144)
eGFR: 55 mL/min/{1.73_m2} — ABNORMAL LOW (ref >59)

## 2023-03-13 LAB — BRAIN NATRIURETIC PEPTIDE: BNP: 727.8 pg/mL — ABNORMAL HIGH (ref 0.0–100.0)

## 2023-03-13 LAB — MAGNESIUM: Magnesium: 2.6 mg/dL — ABNORMAL HIGH (ref 1.6–2.3)

## 2023-03-13 MED ORDER — EMPAGLIFLOZIN 10 MG PO TABS: 10.0000 mg | ORAL_TABLET | Freq: Every day | ORAL | 11 refills | Status: DC

## 2023-03-17 ENCOUNTER — Other Ambulatory Visit: Payer: Self-pay

## 2023-03-17 NOTE — Telephone Encounter (Signed)
 I called patient to discuss Jardiance. She has already stopped the medication.  Pls remove from her med list

## 2023-03-20 ENCOUNTER — Other Ambulatory Visit: Payer: Self-pay | Admitting: Cardiovascular Disease

## 2023-04-02 ENCOUNTER — Other Ambulatory Visit: Payer: Self-pay | Admitting: Emergency Medicine

## 2023-04-02 ENCOUNTER — Ambulatory Visit: Payer: HMO | Attending: Cardiology

## 2023-04-02 DIAGNOSIS — I255 Ischemic cardiomyopathy: Secondary | ICD-10-CM

## 2023-04-03 ENCOUNTER — Ambulatory Visit (INDEPENDENT_AMBULATORY_CARE_PROVIDER_SITE_OTHER): Payer: Medicare HMO

## 2023-04-03 DIAGNOSIS — I255 Ischemic cardiomyopathy: Secondary | ICD-10-CM | POA: Diagnosis not present

## 2023-04-03 LAB — BASIC METABOLIC PANEL
BUN/Creatinine Ratio: 22 (ref 12–28)
BUN: 25 mg/dL (ref 8–27)
CO2: 28 mmol/L (ref 20–29)
Calcium: 11.3 mg/dL — ABNORMAL HIGH (ref 8.7–10.3)
Chloride: 99 mmol/L (ref 96–106)
Creatinine, Ser: 1.12 mg/dL — ABNORMAL HIGH (ref 0.57–1.00)
Glucose: 95 mg/dL (ref 70–99)
Potassium: 5.4 mmol/L — ABNORMAL HIGH (ref 3.5–5.2)
Sodium: 143 mmol/L (ref 134–144)
eGFR: 49 mL/min/{1.73_m2} — ABNORMAL LOW (ref >59)

## 2023-04-03 LAB — CUP PACEART REMOTE DEVICE CHECK
Battery Remaining Longevity: 79 mo
Battery Remaining Percentage: 83 %
Battery Voltage: 2.99 V
Brady Statistic AP VP Percent: 4 %
Brady Statistic AP VS Percent: 1 %
Brady Statistic AS VP Percent: 95 %
Brady Statistic AS VS Percent: 1 %
Brady Statistic RA Percent Paced: 3.4 %
Date Time Interrogation Session: 20250130020011
Implantable Lead Connection Status: 753985
Implantable Lead Connection Status: 753985
Implantable Lead Connection Status: 753985
Implantable Lead Implant Date: 20231101
Implantable Lead Implant Date: 20231101
Implantable Lead Implant Date: 20231101
Implantable Lead Location: 753858
Implantable Lead Location: 753859
Implantable Lead Location: 753860
Implantable Pulse Generator Implant Date: 20231101
Lead Channel Impedance Value: 360 Ohm
Lead Channel Impedance Value: 430 Ohm
Lead Channel Impedance Value: 990 Ohm
Lead Channel Pacing Threshold Amplitude: 0.5 V
Lead Channel Pacing Threshold Amplitude: 0.5 V
Lead Channel Pacing Threshold Amplitude: 1.125 V
Lead Channel Pacing Threshold Pulse Width: 0.5 ms
Lead Channel Pacing Threshold Pulse Width: 0.5 ms
Lead Channel Pacing Threshold Pulse Width: 0.5 ms
Lead Channel Sensing Intrinsic Amplitude: 10.3 mV
Lead Channel Sensing Intrinsic Amplitude: 2.5 mV
Lead Channel Setting Pacing Amplitude: 1.5 V
Lead Channel Setting Pacing Amplitude: 2 V
Lead Channel Setting Pacing Amplitude: 2.125
Lead Channel Setting Pacing Pulse Width: 0.5 ms
Lead Channel Setting Pacing Pulse Width: 0.5 ms
Lead Channel Setting Sensing Sensitivity: 2 mV
Pulse Gen Model: 3562
Pulse Gen Serial Number: 8124818

## 2023-04-03 LAB — ECHOCARDIOGRAM COMPLETE
AV Mean grad: 4 mm[Hg]
AV Peak grad: 7 mm[Hg]
Ao pk vel: 1.32 m/s
Est EF: <20
MV M vel: 6.56 m/s
MV Peak grad: 172.1 mm[Hg]
P 1/2 time: 401 ms
Radius: 0.65 cm
S' Lateral: 4.5 cm

## 2023-04-03 LAB — MAGNESIUM: Magnesium: 2.5 mg/dL — ABNORMAL HIGH (ref 1.6–2.3)

## 2023-04-04 ENCOUNTER — Encounter: Payer: Self-pay | Admitting: Cardiovascular Disease

## 2023-04-04 ENCOUNTER — Other Ambulatory Visit: Payer: Self-pay

## 2023-04-04 DIAGNOSIS — I5042 Chronic combined systolic (congestive) and diastolic (congestive) heart failure: Secondary | ICD-10-CM

## 2023-04-10 DIAGNOSIS — L570 Actinic keratosis: Secondary | ICD-10-CM | POA: Diagnosis not present

## 2023-04-25 ENCOUNTER — Other Ambulatory Visit: Payer: Self-pay

## 2023-04-25 MED ORDER — TORSEMIDE 20 MG PO TABS: 20.0000 mg | ORAL_TABLET | Freq: Every day | ORAL | 0 refills | Status: DC

## 2023-04-25 NOTE — Telephone Encounter (Signed)
 Last office visit: 12/23/22 with plan to f/u  12 months.  next office visit: 05/19/23   Requested Prescriptions   Signed Prescriptions Disp Refills   torsemide (DEMADEX) 20 MG tablet 90 tablet 0    Sig: Take 1 tablet (20 mg total) by mouth daily.    Authorizing Provider: Antonieta Iba    Ordering User: Guerry Minors

## 2023-04-29 ENCOUNTER — Telehealth: Payer: Self-pay | Admitting: Family

## 2023-04-29 NOTE — Telephone Encounter (Signed)
 Late entry:  T Bell called patient on 04/07/23 to discuss HF clinic appointment after receiving a referral from EP office. Patient declined making an appointment stating that she was already seeing cardiology provider. EP office was made aware of patient declining the appointment.

## 2023-04-30 NOTE — Telephone Encounter (Deleted)
 Marland Kitchen

## 2023-05-05 ENCOUNTER — Encounter: Payer: Self-pay | Admitting: Internal Medicine

## 2023-05-09 NOTE — Progress Notes (Signed)
 Remote pacemaker transmission.

## 2023-05-16 ENCOUNTER — Other Ambulatory Visit: Payer: Self-pay | Admitting: Family Medicine

## 2023-05-16 ENCOUNTER — Encounter: Payer: Self-pay | Admitting: Family Medicine

## 2023-05-16 DIAGNOSIS — G8929 Other chronic pain: Secondary | ICD-10-CM

## 2023-05-16 MED ORDER — OXYCODONE HCL 5 MG PO TABS
5.0000 mg | ORAL_TABLET | Freq: Every day | ORAL | 0 refills | Status: DC | PRN
Start: 2023-05-16 — End: 2023-10-23

## 2023-05-18 NOTE — Progress Notes (Unsigned)
 Date:  05/19/2023   ID:  [name], DOB 1940/12/04, MRN [id number]  Patient Location:  [address] [place] [address]   Provider location:   [facility], [place] office  PCP:  Dana Allan, MD  Cardiologist:  Fonnie Mu  Chief Complaint  Patient presents with   2 month follow up     History of Present Illness:    [name] is a 83 y.o. female  past medical history of hepatitis C,  osteoporosis,  arthritis,  chronic back pain s/p multiple fusions,  chronic UTI's,  CAD, ischemic cardiomyopathy with ejection fraction 25% on echocardiogram Mitral valve: There was moderate to severe regurgitation November 2016, PCI to the mid RCA,  proximal LAD August 2016,  Significant medication intolerances Previously not interested in echocardiogram EF in 06/2017, 20 to 25% LBBB PCI of LAD prox and mid 06/2021 who presents for routine follow-up of her cardiomyopathy, CAD  Last seen by myself in clinic October 2024 Seen by EP January 2025  CRT-P implanted 11/23 with a left bundle and LV lead system.  Off midodrine  Numerous medication intolerances discussed with her on today's visit Home numbers: BP low at home "double digits" Asymptomatic from low blood pressure   Guideline directed medical therapy was limited by hypotension requiring midodrine.  Decision made to not start SGLT2 inhibitor given risk for GU infection (prior history of UTIs)   Not interested in MitraClip  Echo  EF 20%, unchanged from 2016  She is very reluctant to change any of her medications Limited activity secondary to leg weakness Denies chest pain, no leg swelling Torsemide 20 mg daily Not on metolazone Reports chronic runny nose  EKG personally reviewed by myself on todays visit EKG Interpretation Date/Time:  Monday May 19 2023 10:05:30 EDT Ventricular Rate:  80 PR Interval:    QRS Duration:  148 QT Interval:  440 QTC Calculation: 507 R  Axis:   -6  Text Interpretation: Ventricular-paced rhythm When compared with ECG of 23-Dec-2022 10:26, Premature ventricular complexes are no longer Present Confirmed by Julien Nordmann [phone]) on 05/19/2023 10:36:09 AM      Other past medical history reviewed Admitted to [facility]  10/23 with acute CHF.   BNP greater than 4500.   Echo showed EF less than 20%, LV severely dilated plus severe MR.  Mitral valve appeared rheumatic or " postinflammatory" tissue.  RVSP moderately elevated at 55 mmHg, RV mildly reduced, mild to moderate AI and mild to moderate TR.    diuresed with IV Lasix and underwent Saint Jude CRT-P for left bundle/AV block and chronic systolic heart failure.  A pacemaker was placed due to patient's preference.    seen by advanced heart failure team 01/16/2022.  on midodrine 2.5 mg 3 times daily.  Midodrine was stopped due to SBPs in the 130s.  on Toprol  daily.    Echocardiogram January 01, 2022 EF estimated less than 20%, mildly decreased RV function with right ventricular systolic pressure estimated 55 mmHg, severely dilated left atrium  Hip surgery April 2023,  Cardiac cath with PCI x2 Successful PCI to proximal LAD using Onyx Frontier 3.0 x 12 mm drug-eluting stent (jailing D1) with 0% residual stenosis and TIMI-3 flow. Successful PCI to mid LAD using Onyx Frontier 2.5 x 15 mm drug-eluting stent (jailing D2) with angioplasty of ostium of D2.  There is 0% residual stenosis in the LAD and 80% residual stenosis at the ostium of D2  with TIMI-3 flow.  Echo 4/23   1. Left ventricular ejection fraction, by estimation, is 25 to 30%. The  left ventricle has severely decreased function. The left ventricle  demonstrates global hypokinesis. The left ventricular internal cavity size  was mildly dilated. There is moderate  asymmetric left ventricular hypertrophy of the septal segment. Left  ventricular diastolic parameters are consistent with Grade II diastolic  dysfunction  (pseudonormalization).   2. Right ventricular systolic function is low normal. The right  ventricular size is mildly enlarged.   3. Left atrial size was moderately dilated.   4. The mitral valve is normal in structure. Moderate mitral valve  regurgitation.   5. The aortic valve is tricuspid. Aortic valve regurgitation is trivial.    cardiac cath on 8/25 that showed ostial LAD 30%, prox LAD 90%, ostial RCA 30%, mid RCA 90%, ostial Ramus 50%.  She underwent successful PCI/DES to the proximal LAD and mid RCA with Xience DES. She was started on aspirin and Brilinta.    she could not tolerated Brilinta 2/2 SOB. She was changed to Plavix.      Past Medical History:  Diagnosis Date   Anxiety    Arthritis    CAD (coronary artery disease)    a. cardiac cath 10/2014: ostial LAD 30%, prox LAD 90% s/p PCI/DES, ostial RCA 30%, mid RCA 90% s/p PCI/DES, ostial Ramus 50%   Chronic back pain    Chronic fatigue    Chronic systolic CHF (congestive heart failure) (HCC)    a. 10/2014 Echo: EF 20%; b. 06/2017 Echo: EF 20-25%, diff HK, Gr2 DD, Mild AI. Mod to sev MR. Mod dil LA.   Hepatitis C    Ischemic cardiomyopathy    a. 10/2014 Echo: EF 20%; b. 06/2017 Echo: EF 20-25%.   LBBB (left bundle branch block)    Moderate to Severe Mitral regurgitation    a. 06/2017 Echo: Mod-Sev MR.   Osteoporosis    UTI (lower urinary tract infection)    Past Surgical History:  Procedure Laterality Date   BACK SURGERY  09/01/2012   T10, L1   BIV PACEMAKER INSERTION CRT-P N/A 01/02/2022   Procedure: BIV PACEMAKER INSERTION CRT-P;  Surgeon: Marinus Maw, MD;  Location: [facility] INVASIVE CV LAB;  Service: Cardiovascular;  Laterality: N/A;   CARDIAC CATHETERIZATION Bilateral 10/27/2014   Procedure: Left Heart Cath and Coronary Angiography;  Surgeon: Antonieta Iba, MD;  Location: [facility] INVASIVE CV LAB;  Service: Cardiovascular;  Laterality: Bilateral;   CARDIAC CATHETERIZATION N/A 10/27/2014   Procedure: Coronary Stent  Intervention;  Surgeon: Iran Ouch, MD;  Location: [facility] INVASIVE CV LAB;  Service: Cardiovascular;  Laterality: N/A;   CORONARY PRESSURE/FFR STUDY N/A 06/13/2021   Procedure: INTRAVASCULAR PRESSURE WIRE/FFR STUDY;  Surgeon: Yvonne Kendall, MD;  Location: [facility] INVASIVE CV LAB;  Service: Cardiovascular;  Laterality: N/A;   CORONARY STENT INTERVENTION N/A 06/13/2021   Procedure: CORONARY STENT INTERVENTION;  Surgeon: Yvonne Kendall, MD;  Location: [facility] INVASIVE CV LAB;  Service: Cardiovascular;  Laterality: N/A;   HIP ARTHROPLASTY Left 06/07/2021   Procedure: ARTHROPLASTY BIPOLAR HIP (HEMIARTHROPLASTY);  Surgeon: Signa Kell, MD;  Location: [facility] ORS;  Service: Orthopedics;  Laterality: Left;   PACEMAKER IMPLANT  01/2022   RIGHT/LEFT HEART CATH AND CORONARY ANGIOGRAPHY N/A 06/13/2021   Procedure: RIGHT/LEFT HEART CATH AND CORONARY ANGIOGRAPHY;  Surgeon: Yvonne Kendall, MD;  Location: [facility] INVASIVE CV LAB;  Service: Cardiovascular;  Laterality: N/A;     Current Meds  Medication Sig   ALPRAZolam (  XANAX) 0.25 MG tablet Take 1 tablet (0.25 mg total) by mouth daily as needed for anxiety.   cetirizine (ZYRTEC) 10 MG tablet Take 1 tablet (10 mg total) by mouth daily.   clopidogrel (PLAVIX) 75 MG tablet TAKE ONE TABLET BY MOUTH ONE TIME DAILY   denosumab (PROLIA) 60 MG/ML SOSY injection Inject 60 mg into the skin every 6 (six) months.   metoprolol succinate (TOPROL-XL) 25 MG 24 hr tablet TAKE ONE-HALF TABLET BY MOUTH ONE TIME DAILY   ondansetron (ZOFRAN-ODT) 4 MG disintegrating tablet TAKE 1 TABLET BY MOUTH TWICE A DAY AS NEEDED FOR NAUSEA AND VOMITING appt further refills no exceptions   oxyCODONE (ROXICODONE) 5 MG immediate release tablet Take 1 tablet (5 mg total) by mouth daily as needed for severe pain (pain score 7-10).   rosuvastatin (CRESTOR) 40 MG tablet TAKE ONE TABLET BY MOUTH ONE TIME DAILY   torsemide (DEMADEX) 20 MG tablet Take 1 tablet (20 mg total) by mouth daily.   Current  Facility-Administered Medications for the 05/19/23 encounter (Office Visit) with Antonieta Iba, MD  Medication   [START ON 07/17/2023] denosumab (PROLIA) injection 60 mg     Allergies:   Ipratropium and Sugar-protein-starch   Social History   Tobacco Use   Smoking status: Former    Current packs/day: 1.00    Average packs/day: 1 pack/day for 10.0 years (10.0 ttl pk-yrs)    Types: Cigarettes   Smokeless tobacco: Never  Vaping Use   Vaping status: Never Used  Substance Use Topics   Alcohol use: No    Alcohol/week: 0.0 standard drinks of alcohol   Drug use: No     Family Hx: The patient's family history includes Arthritis in her mother; Diabetes in her father; Heart disease in her father.  ROS:   Please see the history of present illness.    Review of Systems  Constitutional: Negative.   HENT: Negative.    Respiratory: Negative.    Cardiovascular: Negative.   Gastrointestinal: Negative.   Musculoskeletal: Negative.        Leg weakness  Neurological: Negative.   Psychiatric/Behavioral: Negative.    All other systems reviewed and are negative.   Labs/Other Tests and Data Reviewed:    Recent Labs: 07/16/2022: ALT 18; Hemoglobin 14.4; Platelets 193.0; TSH 2.30 03/12/2023: BNP 727.8 04/02/2023: BUN 25; Creatinine, Ser 1.12; Magnesium 2.5; Potassium 5.4; Sodium 143   Recent Lipid Panel Lab Results  Component Value Date/Time   CHOL 168 07/16/2022 03:15 PM   CHOL 176 05/08/2021 03:25 PM   TRIG 274.0 (H) 07/16/2022 03:15 PM   HDL 45.20 07/16/2022 03:15 PM   HDL 48 05/08/2021 03:25 PM   CHOLHDL 4 07/16/2022 03:15 PM   LDLCALC 42 01/02/2022 01:10 AM   LDLCALC 81 05/08/2021 03:25 PM   LDLDIRECT 72.0 07/16/2022 03:15 PM    Wt Readings from Last 3 Encounters:  05/19/23 118 lb 2 oz (53.6 kg)  03/12/23 118 lb 8 oz (53.8 kg)  12/23/22 119 lb 2 oz (54 kg)     Exam:    Vital Signs: Vital signs may also be detailed in the HPI BP (!) 140/60 (BP Location: Left Arm, Patient  Position: Sitting, Cuff Size: Normal)   Pulse 80   Ht 5' (1.524 m)   Wt 118 lb 2 oz (53.6 kg)   SpO2 95%   BMI 23.07 kg/m   Constitutional:  oriented to person, place, and time. No distress.  HENT:  Head: Grossly normal Eyes:  no discharge.  No scleral icterus.  Neck: No JVD, no carotid bruits  Cardiovascular: Regular rate and rhythm, no murmurs appreciated Pulmonary/Chest: Clear to auscultation bilaterally, no wheezes or rails Abdominal: Soft.  no distension.  no tenderness.  Musculoskeletal: Normal range of motion Neurological:  normal muscle tone. Coordination normal. No atrophy Skin: Skin warm and dry Psychiatric: normal affect, pleasant   ASSESSMENT & PLAN:    Coronary artery disease of native artery of native heart with stable angina pectoris (HCC) -  Currently with no symptoms of angina. No further workup at this time. Continue current medication regimen. History of stent x2 to proximal and mid LAD  on Plavix statin beta-blocker  Ischemic cardiomyopathy -chronic systolic CHF EF 20%, Previously declined  ICD, has pacemaker Long history of medication reluctance, intolerance Tolerating metoprolol succinate, torsemide, Crestor Off midodrine, blood pressure low but stable SGLT2 inhibitor previously not initiated secondary to concern for UTIs GDMT limited secondary to patient and low blood pressure  Mixed hyperlipidemia -  Continue Crestor 40 daily LDL goal less than 55  Essential hypertension -  Blood pressure often running low, off midodrine, denies orthostasis symptoms  Debility Legs weak, unable to walk very far, husband primary caretaker Presents today in wheelchair  Anxiety -  Managed by primary care  Pacer Has pacer download February, Followed by EP  Runny nose/rhinitis Suggest fluticasone nasal spray    Signed, Julien Nordmann, MD  05/19/2023 10:35 AM    [facility] [address], [place],  [place] [address]

## 2023-05-19 ENCOUNTER — Other Ambulatory Visit: Payer: Self-pay | Admitting: Family Medicine

## 2023-05-19 ENCOUNTER — Encounter: Payer: Self-pay | Admitting: Cardiovascular Disease

## 2023-05-19 ENCOUNTER — Other Ambulatory Visit: Payer: Self-pay

## 2023-05-19 ENCOUNTER — Ambulatory Visit: Payer: HMO | Attending: Cardiovascular Disease | Admitting: Cardiovascular Disease

## 2023-05-19 VITALS — BP 140/60 | HR 80 | Ht 60.0 in | Wt 118.1 lb

## 2023-05-19 DIAGNOSIS — I10 Essential (primary) hypertension: Secondary | ICD-10-CM | POA: Diagnosis not present

## 2023-05-19 DIAGNOSIS — I2510 Atherosclerotic heart disease of native coronary artery without angina pectoris: Secondary | ICD-10-CM | POA: Diagnosis not present

## 2023-05-19 DIAGNOSIS — I5022 Chronic systolic (congestive) heart failure: Secondary | ICD-10-CM

## 2023-05-19 DIAGNOSIS — I340 Nonrheumatic mitral (valve) insufficiency: Secondary | ICD-10-CM | POA: Diagnosis not present

## 2023-05-19 DIAGNOSIS — I700 Atherosclerosis of aorta: Secondary | ICD-10-CM | POA: Diagnosis not present

## 2023-05-19 DIAGNOSIS — I5042 Chronic combined systolic (congestive) and diastolic (congestive) heart failure: Secondary | ICD-10-CM

## 2023-05-19 DIAGNOSIS — I255 Ischemic cardiomyopathy: Secondary | ICD-10-CM | POA: Diagnosis not present

## 2023-05-19 DIAGNOSIS — Z950 Presence of cardiac pacemaker: Secondary | ICD-10-CM | POA: Diagnosis not present

## 2023-05-19 DIAGNOSIS — E782 Mixed hyperlipidemia: Secondary | ICD-10-CM | POA: Diagnosis not present

## 2023-05-19 DIAGNOSIS — R0989 Other specified symptoms and signs involving the circulatory and respiratory systems: Secondary | ICD-10-CM

## 2023-05-19 MED ORDER — IPRATROPIUM BROMIDE 0.03 % NA SOLN
2.0000 | Freq: Two times a day (BID) | NASAL | 12 refills | Status: DC
Start: 2023-05-19 — End: 2023-10-23

## 2023-05-19 MED ORDER — CLOPIDOGREL BISULFATE 75 MG PO TABS: 75.0000 mg | ORAL_TABLET | Freq: Every day | ORAL | 3 refills | Status: DC

## 2023-05-19 MED ORDER — TORSEMIDE 20 MG PO TABS: 20.0000 mg | ORAL_TABLET | Freq: Every day | ORAL | 3 refills | Status: DC

## 2023-05-19 NOTE — Patient Instructions (Signed)
 Medication Instructions:  No changes  If you need a refill on your cardiac medications before your next appointment, please call your pharmacy.    Lab work: No new labs needed   Testing/Procedures: No new testing needed   Follow-Up: At [facility], you and your health needs are our priority.  As part of our continuing mission to provide you with exceptional heart care, we have created designated Provider Care Teams.  These Care Teams include your primary Cardiologist (physician) and Advanced Practice Providers (APPs -  Physician Assistants and Nurse Practitioners) who all work together to provide you with the care you need, when you need it.  You will need a follow up appointment in 6 months  Providers on your designated Care Team:   Nicolasa Ducking, NP Eula Listen, PA-C Cadence Fransico Michael, [place]  COVID-19 Vaccine Information can be found at: [email or web address] For questions related to vaccine distribution or appointments, please email [email or web address] or call [phone].

## 2023-05-20 ENCOUNTER — Encounter: Payer: Self-pay | Admitting: Cardiovascular Disease

## 2023-05-20 ENCOUNTER — Encounter: Payer: Self-pay | Admitting: Pharmacist

## 2023-05-20 NOTE — Progress Notes (Signed)
 Pharmacy Quality Measure Review  This patient is appearing on a report for being at risk of failing the adherence measure for diabetes medications this calendar year.   Medication: Jardiance  Inappropriately attributed to Diabetes adherence measure. Jardiance was prescribed for non-diabetes indications.   No longer taking Jardiance due intolerance (dizziness)

## 2023-06-25 ENCOUNTER — Other Ambulatory Visit (HOSPITAL_COMMUNITY): Payer: Self-pay

## 2023-06-25 ENCOUNTER — Telehealth: Payer: Self-pay

## 2023-06-25 NOTE — Telephone Encounter (Signed)
 Prolia VOB initiated via [email or web address]  Next Prolia inj DUE: 07/17/23

## 2023-06-30 ENCOUNTER — Other Ambulatory Visit (HOSPITAL_COMMUNITY): Payer: Self-pay

## 2023-06-30 NOTE — Telephone Encounter (Signed)
 Pt ready for scheduling for PROLIA  on or after : 07/17/23  Option# 1: Buy/Bill (Office supplied medication)  Out-of-pocket cost due at time of clinic visit: $332  Number of injection/visits approved: ---  Primary: [employer] Prolia  co-insurance: 20% Admin fee co-insurance: 0%  Secondary: --- Prolia  co-insurance:  Admin fee co-insurance:   Medical Benefit Details: Date Benefits were checked: 06/27/23 Deductible: NO/ Coinsurance: 20%/ Admin Fee: 0%  Prior Auth: N/A PA# Expiration Date:   # of doses approved: ----------------------------------------------------------------------- Option# 2- Med Obtained from pharmacy:  Pharmacy benefit: Copay [phone] (Paid to pharmacy) Admin Fee: 0% (Pay at clinic)  Prior Auth: N/A PA# Expiration Date:   # of doses approved:   If patient wants fill through the pharmacy benefit please send prescription to: [employer], and include estimated need by date in rx notes. Pharmacy will ship medication directly to the office.  Patient NOT eligible for Prolia  Copay Card. Copay Card can make patient's cost as little as $25. Link to apply: https://www.amgensupportplus.com/copay  ** This summary of benefits is an estimation of the patient's out-of-pocket cost. Exact cost may very based on individual plan coverage.

## 2023-07-03 ENCOUNTER — Ambulatory Visit (INDEPENDENT_AMBULATORY_CARE_PROVIDER_SITE_OTHER): Payer: Medicare HMO

## 2023-07-03 DIAGNOSIS — I255 Ischemic cardiomyopathy: Secondary | ICD-10-CM

## 2023-07-03 LAB — CUP PACEART REMOTE DEVICE CHECK
Battery Remaining Longevity: 73 mo
Battery Remaining Percentage: 80 %
Battery Voltage: 2.99 V
Brady Statistic AP VP Percent: 3.2 %
Brady Statistic AP VS Percent: 1 %
Brady Statistic AS VP Percent: 96 %
Brady Statistic AS VS Percent: 1 %
Brady Statistic RA Percent Paced: 2.8 %
Date Time Interrogation Session: 20250501020012
Implantable Lead Connection Status: 753985
Implantable Lead Connection Status: 753985
Implantable Lead Connection Status: 753985
Implantable Lead Implant Date: 20231101
Implantable Lead Implant Date: 20231101
Implantable Lead Implant Date: 20231101
Implantable Lead Location: 753858
Implantable Lead Location: 753859
Implantable Lead Location: 753860
Implantable Pulse Generator Implant Date: 20231101
Lead Channel Impedance Value: 310 Ohm
Lead Channel Impedance Value: 430 Ohm
Lead Channel Impedance Value: 960 Ohm
Lead Channel Pacing Threshold Amplitude: 0.5 V
Lead Channel Pacing Threshold Amplitude: 0.625 V
Lead Channel Pacing Threshold Amplitude: 1.125 V
Lead Channel Pacing Threshold Pulse Width: 0.5 ms
Lead Channel Pacing Threshold Pulse Width: 0.5 ms
Lead Channel Pacing Threshold Pulse Width: 0.5 ms
Lead Channel Sensing Intrinsic Amplitude: 10.4 mV
Lead Channel Sensing Intrinsic Amplitude: 3.2 mV
Lead Channel Setting Pacing Amplitude: 1.625
Lead Channel Setting Pacing Amplitude: 2 V
Lead Channel Setting Pacing Amplitude: 2.125
Lead Channel Setting Pacing Pulse Width: 0.5 ms
Lead Channel Setting Pacing Pulse Width: 0.5 ms
Lead Channel Setting Sensing Sensitivity: 2 mV
Pulse Gen Model: 3562
Pulse Gen Serial Number: 8124818

## 2023-07-04 ENCOUNTER — Encounter: Payer: Self-pay | Admitting: Cardiology

## 2023-07-09 DIAGNOSIS — H25043 Posterior subcapsular polar age-related cataract, bilateral: Secondary | ICD-10-CM | POA: Diagnosis not present

## 2023-07-09 DIAGNOSIS — H2512 Age-related nuclear cataract, left eye: Secondary | ICD-10-CM | POA: Diagnosis not present

## 2023-07-09 DIAGNOSIS — H2513 Age-related nuclear cataract, bilateral: Secondary | ICD-10-CM | POA: Diagnosis not present

## 2023-07-09 DIAGNOSIS — H25042 Posterior subcapsular polar age-related cataract, left eye: Secondary | ICD-10-CM | POA: Diagnosis not present

## 2023-07-09 DIAGNOSIS — M3501 Sicca syndrome with keratoconjunctivitis: Secondary | ICD-10-CM | POA: Diagnosis not present

## 2023-07-10 ENCOUNTER — Other Ambulatory Visit: Payer: Self-pay | Admitting: Cardiovascular Disease

## 2023-07-23 ENCOUNTER — Encounter: Payer: Self-pay | Admitting: *Deleted

## 2023-07-25 ENCOUNTER — Other Ambulatory Visit: Payer: Self-pay | Admitting: Family Medicine

## 2023-07-25 ENCOUNTER — Ambulatory Visit (INDEPENDENT_AMBULATORY_CARE_PROVIDER_SITE_OTHER)

## 2023-07-25 DIAGNOSIS — M810 Age-related osteoporosis without current pathological fracture: Secondary | ICD-10-CM

## 2023-07-25 DIAGNOSIS — E559 Vitamin D deficiency, unspecified: Secondary | ICD-10-CM

## 2023-07-25 MED ORDER — DENOSUMAB 60 MG/ML ~~LOC~~ SOSY
60.0000 mg | PREFILLED_SYRINGE | SUBCUTANEOUS | Status: DC
Start: 2024-01-25 — End: 2024-07-22

## 2023-07-25 NOTE — Progress Notes (Signed)
 Patient was administered a Prolia  injection into her left arm. Patient tolerated the Prolia  injection well.

## 2023-07-25 NOTE — Progress Notes (Cosign Needed)
 Patient is in office today for a nurse visit for injection of Prolia . Patient Injection was given in the  Left arm. Patient tolerated injection well.

## 2023-07-29 ENCOUNTER — Other Ambulatory Visit: Payer: Self-pay

## 2023-07-29 ENCOUNTER — Encounter: Payer: Self-pay | Admitting: Ophthalmology

## 2023-07-29 DIAGNOSIS — M3501 Sicca syndrome with keratoconjunctivitis: Secondary | ICD-10-CM | POA: Diagnosis not present

## 2023-07-29 DIAGNOSIS — H25042 Posterior subcapsular polar age-related cataract, left eye: Secondary | ICD-10-CM | POA: Diagnosis not present

## 2023-07-29 DIAGNOSIS — H2512 Age-related nuclear cataract, left eye: Secondary | ICD-10-CM | POA: Diagnosis not present

## 2023-07-29 DIAGNOSIS — H2513 Age-related nuclear cataract, bilateral: Secondary | ICD-10-CM | POA: Diagnosis not present

## 2023-07-29 DIAGNOSIS — H25043 Posterior subcapsular polar age-related cataract, bilateral: Secondary | ICD-10-CM | POA: Diagnosis not present

## 2023-07-30 ENCOUNTER — Encounter: Payer: Self-pay | Admitting: Ophthalmology

## 2023-07-30 NOTE — Anesthesia Preprocedure Evaluation (Addendum)
 Anesthesia Evaluation  Patient identified by MRN, date of birth, ID band Patient awake    Reviewed: Allergy & Precautions, H&P , NPO status , Patient's Chart, lab work & pertinent test results  Airway Mallampati: IV  TM Distance: <3 FB Neck ROM: Full  Mouth opening: Limited Mouth Opening Comment: Limited oral opening, very short TMD Dental no notable dental hx.  Veneers upper front, permanent bridge upper right jaw:   Pulmonary COPD, former smoker   Pulmonary exam normal breath sounds clear to auscultation       Cardiovascular hypertension, + CAD, + Past MI and +CHF  Normal cardiovascular exam+ dysrhythmias + Cardiac Defibrillator  Rhythm:Regular Rate:Normal  a. cardiac cath 10/2014: ostial LAD 30%, prox LAD 90% s/p PCI/DES, ostial RCA 30%, mid RCA 90% s/p PCI/DES, ostial Ramus 50%   06-13-21 cath  1. Severe single-vessel coronary artery disease with sequential 80-90% proximal/mid LAD stenoses flanking previously placed, which are highly significant by iFR and involve D1 and D2. 2. Mild-moderate, nonobstructive CAD involving ramus intermedius and RCA. 3. Widely patent mid LAD and mid RCA stents. 4. Mildly elevated left and right heart filling pressures. 5. Mild-moderate pulmonary hypertension. 6. Mildly-moderately reduced Fick cardiac output/index. 7. Successful PCI to proximal LAD using Onyx Frontier 3.0 x 12 mm drug-eluting stent (jailing D1) with 0% residual stenosis and TIMI-3 flow. 8. Successful PCI to mid LAD using Onyx Frontier 2.5 x 15 mm drug-eluting stent (jailing D2) with angioplasty of ostium of D2.  There is 0% residual stenosis in the LAD and 80% residual stenosis at the ostium of D2 with TIMI-3 flow. 9. Challenging intervention via right radial approach due to tortuosity of the right subclavian artery and radial artery vasospasm.  Consider alternative access for future catheterizations.   Recommendations: 1. Dual  antiplatelet therapy with aspirin  and clopidogrel  for at least 12 months. 2. Aggressive secondary prevention of coronary artery disease. 3. Continued gentle diuresis. 4. Escalate goal-directed medical therapy for acute on chronic HFrEF.   Sammy Crisp, MD [facility]    04-02-23 echo ndications:    I50.40* Unspecified combined systolic (congestive) and  diastolic                 (congestive) heart failure    History:        Patient has prior history of Echocardiogram examinations.                  Cardiomyopathy, CAD, Abnormal ECG, Pacemaker and  Defibrillator,                 COPD, Signs/Symptoms:Fatigue; Risk Factors:Hypertension  and Dyslipidemia.   Referring Phys: [id number] SUZANN RIDDLE   IMPRESSIONS    1. Left ventricular ejection fraction, by estimation, is <20%. The left  ventricle has severely decreased function. The left ventricle demonstrates  global hypokinesis. The left ventricular internal cavity size was  moderately dilated. Left ventricular  diastolic parameters are indeterminate. The average left ventricular  global longitudinal strain is -9.0 %. The global longitudinal strain is  abnormal.   2. Right ventricular systolic function is normal. The right ventricular  size is normal. Tricuspid regurgitation signal is inadequate for assessing  PA pressure.   3. Left atrial size was severely dilated.   4. The mitral valve is normal in structure. Severe mitral valve  regurgitation. No evidence of mitral stenosis.   5. The aortic valve is normal in structure. Aortic valve regurgitation is  mild. No aortic stenosis is present.   6.  The inferior vena cava is normal in size with greater than 50%  respiratory variability, suggesting right atrial pressure of 3 mmHg. There is moderately elevated pulmonary artery systolic  pressure.    04-03-23  Device interrogation done today and reviewed by myself:  Battery 6.6 years Lead thresholds, impedence, sensing stable   Brief PMT episodes noted Adjusted thresholds to auto  EKG Interpretation Date/Time:                  Monday May 19 2023 10:05:30 EDT Ventricular Rate:         80 PR Interval:                   QRS Duration:             148 QT Interval:                 440 QTC Calculation:507 R Axis:                         -6   Text Interpretation:Ventricular-paced rhythm When compared with ECG of 23-Dec-2022 10:26, Premature ventricular complexes are no longer Present Confirmed by Belva Boyden [id number]) on 05/19/2023       Neuro/Psych  PSYCHIATRIC DISORDERS Anxiety     negative neurological ROS  negative psych ROS   GI/Hepatic negative GI ROS, Neg liver ROS,,,(+) Hepatitis -, C  Endo/Other  negative endocrine ROS    Renal/GU Renal diseasenegative Renal ROS  negative genitourinary   Musculoskeletal negative musculoskeletal ROS (+) Arthritis ,    Abdominal   Peds negative pediatric ROS (+)  Hematology negative hematology ROS (+) Blood dyscrasia, anemia   Anesthesia Other Findings UTI (lower urinary tract infection)  Arthritis Osteoporosis  Hepatitis C Chronic systolic CHF (congestive heart failure) (HCC)  CAD (coronary artery disease) Ischemic cardiomyopathy  Chronic back pain Chronic fatigue  Anxiety LBBB (left bundle branch block) Combined systolic and diastolic heart failure Aortic atherosclerosis (HCC) Essential hypertension  Cardiac resynchronization therapy pacemaker (CRT-P) in place COPD (chronic obstructive pulmonary disease) (HCC)  AICD (automatic cardioverter/defibrillator) present Severe mitral regurgitation by prior echocardiogram  Mild aortic regurgitation Moderate pulmonary hypertension (HCC)  Low BP, not taking midodrine  anymore, has been tolerating metoprolol  Hx placement cardiac DES   Anxious, very bad back, chronic back pain.  Reproductive/Obstetrics negative OB ROS                              Anesthesia  Physical Anesthesia Plan  ASA: 4  Anesthesia Plan: MAC   Post-op Pain Management:    Induction: Intravenous  PONV Risk Score and Plan:   Airway Management Planned: Natural Airway and Nasal Cannula  Additional Equipment:   Intra-op Plan:   Post-operative Plan:   Informed Consent: I have reviewed the patients History and Physical, chart, labs and discussed the procedure including the risks, benefits and alternatives for the proposed anesthesia with the patient or authorized representative who has indicated his/her understanding and acceptance.     Dental Advisory Given  Plan Discussed with: Anesthesiologist, CRNA and Surgeon  Anesthesia Plan Comments: (Patient consented for risks of anesthesia including but not limited to:  - adverse reactions to medications - damage to eyes, teeth, lips or other oral mucosa - nerve damage due to positioning  - sore throat or hoarseness - Damage to heart, brain, nerves, lungs, other parts of body or loss of life  Patient  voiced understanding and assent.)         Anesthesia Quick Evaluation

## 2023-07-31 ENCOUNTER — Encounter: Payer: Self-pay | Admitting: Ophthalmology

## 2023-08-01 NOTE — Addendum Note (Signed)
 Addended by: Reynolds Cea A on: 08/01/2023 08:28 AM   Modules accepted: Orders

## 2023-08-03 ENCOUNTER — Encounter: Payer: Self-pay | Admitting: Nurse Practitioner

## 2023-08-05 NOTE — Telephone Encounter (Signed)
 Copied from CRM [id number]. Topic: Clinical - Medical Advice >> Aug 05, 2023  3:47 PM [place] A wrote: Reason for CRM: Patient called regarding note she sent on Mychart-Patient said that she is not using ipratropium (ATROVENT ) 0.03 % nasal spray Nasal Spray because he has bad side effects and she lost her voice for a year

## 2023-08-06 ENCOUNTER — Telehealth: Payer: Self-pay

## 2023-08-06 NOTE — Telephone Encounter (Signed)
 Copied from CRM [id number]. Topic: General - Other >> Aug 06, 2023  8:25 AM Emmet Harm C wrote: Reason for CRM: Patient wanted to know if the alternate nasal spray was order

## 2023-08-07 NOTE — Discharge Instructions (Signed)

## 2023-08-12 ENCOUNTER — Ambulatory Visit
Admission: RE | Admit: 2023-08-12 | Discharge: 2023-08-12 | Disposition: A | Discharge status: Home / Self Care | Attending: Ophthalmology | Admitting: Ophthalmology

## 2023-08-12 ENCOUNTER — Ambulatory Visit: Payer: Self-pay | Admitting: Anesthesiology

## 2023-08-12 ENCOUNTER — Other Ambulatory Visit: Payer: Self-pay

## 2023-08-12 ENCOUNTER — Encounter: Payer: Self-pay | Admitting: Ophthalmology

## 2023-08-12 ENCOUNTER — Encounter
Admission: RE | Disposition: A | Discharge status: Home / Self Care | Payer: Self-pay | Source: Home / Self Care | Attending: Ophthalmology

## 2023-08-12 DIAGNOSIS — H2512 Age-related nuclear cataract, left eye: Secondary | ICD-10-CM | POA: Diagnosis not present

## 2023-08-12 DIAGNOSIS — Z95810 Presence of automatic (implantable) cardiac defibrillator: Secondary | ICD-10-CM | POA: Insufficient documentation

## 2023-08-12 DIAGNOSIS — I429 Cardiomyopathy, unspecified: Secondary | ICD-10-CM | POA: Insufficient documentation

## 2023-08-12 DIAGNOSIS — I272 Pulmonary hypertension, unspecified: Secondary | ICD-10-CM | POA: Insufficient documentation

## 2023-08-12 DIAGNOSIS — I2510 Atherosclerotic heart disease of native coronary artery without angina pectoris: Secondary | ICD-10-CM | POA: Insufficient documentation

## 2023-08-12 DIAGNOSIS — M1990 Unspecified osteoarthritis, unspecified site: Secondary | ICD-10-CM | POA: Insufficient documentation

## 2023-08-12 DIAGNOSIS — I080 Rheumatic disorders of both mitral and aortic valves: Secondary | ICD-10-CM | POA: Insufficient documentation

## 2023-08-12 DIAGNOSIS — Z7902 Long term (current) use of antithrombotics/antiplatelets: Secondary | ICD-10-CM | POA: Insufficient documentation

## 2023-08-12 DIAGNOSIS — J449 Chronic obstructive pulmonary disease, unspecified: Secondary | ICD-10-CM | POA: Insufficient documentation

## 2023-08-12 DIAGNOSIS — Z7951 Long term (current) use of inhaled steroids: Secondary | ICD-10-CM | POA: Diagnosis not present

## 2023-08-12 DIAGNOSIS — E785 Hyperlipidemia, unspecified: Secondary | ICD-10-CM | POA: Insufficient documentation

## 2023-08-12 DIAGNOSIS — I5042 Chronic combined systolic (congestive) and diastolic (congestive) heart failure: Secondary | ICD-10-CM | POA: Insufficient documentation

## 2023-08-12 DIAGNOSIS — Z833 Family history of diabetes mellitus: Secondary | ICD-10-CM | POA: Diagnosis not present

## 2023-08-12 DIAGNOSIS — I252 Old myocardial infarction: Secondary | ICD-10-CM | POA: Insufficient documentation

## 2023-08-12 DIAGNOSIS — D649 Anemia, unspecified: Secondary | ICD-10-CM | POA: Diagnosis not present

## 2023-08-12 DIAGNOSIS — F419 Anxiety disorder, unspecified: Secondary | ICD-10-CM | POA: Insufficient documentation

## 2023-08-12 DIAGNOSIS — Z87891 Personal history of nicotine dependence: Secondary | ICD-10-CM | POA: Diagnosis not present

## 2023-08-12 DIAGNOSIS — Z79899 Other long term (current) drug therapy: Secondary | ICD-10-CM | POA: Diagnosis not present

## 2023-08-12 DIAGNOSIS — Z955 Presence of coronary angioplasty implant and graft: Secondary | ICD-10-CM | POA: Diagnosis not present

## 2023-08-12 DIAGNOSIS — I110 Hypertensive heart disease with heart failure: Secondary | ICD-10-CM | POA: Insufficient documentation

## 2023-08-12 HISTORY — DX: Nonrheumatic mitral (valve) insufficiency: I34.0

## 2023-08-12 HISTORY — DX: Nonrheumatic aortic (valve) insufficiency: I35.1

## 2023-08-12 HISTORY — DX: Presence of cardiac pacemaker: Z95.0

## 2023-08-12 HISTORY — DX: Atherosclerosis of aorta: I70.0

## 2023-08-12 HISTORY — DX: Presence of automatic (implantable) cardiac defibrillator: Z95.810

## 2023-08-12 HISTORY — DX: Essential (primary) hypertension: I10

## 2023-08-12 HISTORY — DX: Pulmonary hypertension, unspecified: I27.20

## 2023-08-12 HISTORY — DX: Unspecified combined systolic (congestive) and diastolic (congestive) heart failure: I50.40

## 2023-08-12 HISTORY — DX: Presence of coronary angioplasty implant and graft: Z95.5

## 2023-08-12 HISTORY — PX: CATARACT EXTRACTION W/PHACO: SHX586

## 2023-08-12 HISTORY — DX: Chronic obstructive pulmonary disease, unspecified: J44.9

## 2023-08-12 SURGERY — PHACOEMULSIFICATION, CATARACT, WITH IOL INSERTION
Anesthesia: Monitor Anesthesia Care | Site: Eye | Laterality: Left

## 2023-08-12 MED ORDER — TETRACAINE HCL 0.5 % OP SOLN
1.0000 [drp] | OPHTHALMIC | Status: DC | PRN
  Administered 2023-08-12 (×3): 1 [drp] via OPHTHALMIC

## 2023-08-12 MED ORDER — FENTANYL CITRATE (PF) 100 MCG/2ML IJ SOLN
INTRAMUSCULAR | Status: DC | PRN
  Administered 2023-08-12 (×2): 50 ug via INTRAVENOUS

## 2023-08-12 MED ORDER — MIDAZOLAM HCL 2 MG/2ML IJ SOLN
INTRAMUSCULAR | Status: DC | PRN
  Administered 2023-08-12: 1 mg via INTRAVENOUS

## 2023-08-12 MED ORDER — SIGHTPATH DOSE#1 NA CHONDROIT SULF-NA HYALURON 40-17 MG/ML IO SOLN
INTRAOCULAR | Status: DC | PRN
  Administered 2023-08-12: 1 mL via INTRAOCULAR

## 2023-08-12 MED ORDER — MIDAZOLAM HCL 2 MG/2ML IJ SOLN
INTRAMUSCULAR | Status: AC
  Filled 2023-08-12: qty 2

## 2023-08-12 MED ORDER — FENTANYL CITRATE (PF) 100 MCG/2ML IJ SOLN
INTRAMUSCULAR | Status: AC
Start: 2023-08-12 — End: ?
  Filled 2023-08-12: qty 2

## 2023-08-12 MED ORDER — ARMC OPHTHALMIC DILATING DROPS
1.0000 | OPHTHALMIC | Status: DC | PRN
  Administered 2023-08-12 (×3): 1 via OPHTHALMIC

## 2023-08-12 MED ORDER — LACTATED RINGERS IV SOLN: INTRAVENOUS | Status: DC

## 2023-08-12 MED ORDER — ARMC OPHTHALMIC DILATING DROPS
OPHTHALMIC | Status: AC
  Filled 2023-08-12: qty 0.5

## 2023-08-12 MED ORDER — SIGHTPATH DOSE#1 BSS IO SOLN
INTRAOCULAR | Status: DC | PRN
  Administered 2023-08-12: 15 mL via INTRAOCULAR

## 2023-08-12 MED ORDER — BRIMONIDINE TARTRATE-TIMOLOL 0.2-0.5 % OP SOLN
OPHTHALMIC | Status: DC | PRN
Start: 2023-08-12 — End: 2023-08-12
  Administered 2023-08-12: 1 [drp] via OPHTHALMIC

## 2023-08-12 MED ORDER — TETRACAINE HCL 0.5 % OP SOLN
OPHTHALMIC | Status: AC
  Filled 2023-08-12: qty 4

## 2023-08-12 MED ORDER — MOXIFLOXACIN HCL 0.5 % OP SOLN
OPHTHALMIC | Status: DC | PRN
  Administered 2023-08-12: .2 mL via OPHTHALMIC

## 2023-08-12 MED ORDER — LIDOCAINE HCL (PF) 2 % IJ SOLN
INTRAMUSCULAR | Status: DC | PRN
  Administered 2023-08-12: 2 mL

## 2023-08-12 MED ORDER — SIGHTPATH DOSE#1 BSS IO SOLN
INTRAOCULAR | Status: DC | PRN
  Administered 2023-08-12: 59 mL via OPHTHALMIC

## 2023-08-12 SURGICAL SUPPLY — 12 items
CATARACT SUITE SIGHTPATH (MISCELLANEOUS) ×1 IMPLANT
CYSTOTOME ANGL RVRS SHRT 25G (CUTTER) ×2 IMPLANT
CYSTOTOME ANGL RVRS SHRT 25GA (CUTTER) ×1 IMPLANT
FEE CATARACT SUITE SIGHTPATH (MISCELLANEOUS) ×2 IMPLANT
GLOVE BIOGEL PI IND STRL 8 (GLOVE) ×2 IMPLANT
GLOVE SURG LX STRL 8.0 MICRO (GLOVE) ×2 IMPLANT
GLOVE SURG PROTEXIS BL SZ6.5 (GLOVE) ×1 IMPLANT
GLOVE SURG SYN 6.5 PF PI BL (GLOVE) ×2 IMPLANT
LENS IOL TECNIS EYHANCE 22.0 (Intraocular Lens) IMPLANT
NDL FILTER BLUNT 18X1 1/2 (NEEDLE) ×2 IMPLANT
NEEDLE FILTER BLUNT 18X1 1/2 (NEEDLE) ×1 IMPLANT
SYR 3ML LL SCALE MARK (SYRINGE) ×2 IMPLANT

## 2023-08-12 NOTE — Op Note (Signed)
 PREOPERATIVE DIAGNOSIS:  Nuclear sclerotic cataract of the left eye.   POSTOPERATIVE DIAGNOSIS:  Nuclear sclerotic cataract of the left eye.   OPERATIVE PROCEDURE:ORPROCALL@   SURGEON:  Clair Crews, MD.   ANESTHESIA:  Anesthesiologist: Emilie Harden, MD CRNA: Jahoo, Sonia, CRNA  1.      Managed anesthesia care. 2.     0.40ml of Shugarcaine was instilled following the paracentesis   COMPLICATIONS:  None.   TECHNIQUE:   Stop and chop   DESCRIPTION OF PROCEDURE:  The patient was examined and consented in the preoperative holding area where the aforementioned topical anesthesia was applied to the left eye and then brought back to the Operating Room where the left eye was prepped and draped in the usual sterile ophthalmic fashion and a lid speculum was placed. A paracentesis was created with the side port blade and the anterior chamber was filled with viscoelastic. A near clear corneal incision was performed with the steel keratome. A continuous curvilinear capsulorrhexis was performed with a cystotome followed by the capsulorrhexis forceps. Hydrodissection and hydrodelineation were carried out with BSS on a blunt cannula. The lens was removed in a stop and chop  technique and the remaining cortical material was removed with the irrigation-aspiration handpiece. The capsular bag was inflated with viscoelastic and the Technis [id number] lens was placed in the capsular bag without complication. The remaining viscoelastic was removed from the eye with the irrigation-aspiration handpiece. The wounds were hydrated. The anterior chamber was flushed with BSS and the eye was inflated to physiologic pressure. 0.34ml Vigamox was placed in the anterior chamber. The wounds were found to be water tight. The eye was dressed with Combigan. The patient was given protective glasses to wear throughout the day and a shield with which to sleep tonight. The patient was also given drops with which to begin a drop regimen  today and will follow-up with me in one day. Implant Name Type Inv. Item Serial No. Manufacturer Lot No. LRB No. Used Action  LENS IOL TECNIS EYHANCE 22.0 - [id number] Intraocular Lens LENS IOL TECNIS EYHANCE 22.0 [id number] [employer]  Left 1 Implanted    Procedure(s): PHACOEMULSIFICATION, CATARACT, WITH IOL INSERTION 8.15 00:51.1 (Left)  Electronically signed: Clair Crews 08/12/2023 8:09 AM

## 2023-08-12 NOTE — Transfer of Care (Signed)
 Immediate Anesthesia Transfer of Care Note  Patient: [name]  Procedure(s) Performed: PHACOEMULSIFICATION, CATARACT, WITH IOL INSERTION 8.15 00:51.1 (Left: Eye)  Patient Location: PACU  Anesthesia Type: MAC  Level of Consciousness: awake, alert  and patient cooperative  Airway and Oxygen Therapy: Patient Spontanous Breathing and Patient connected to supplemental oxygen  Post-op Assessment: Post-op Vital signs reviewed, Patient's Cardiovascular Status Stable, Respiratory Function Stable, Patent Airway and No signs of Nausea or vomiting  Post-op Vital Signs: Reviewed and stable  Complications: No notable events documented.

## 2023-08-12 NOTE — H&P (Signed)
 [facility]   Primary Care Physician:  Bluford Burkitt, NP Ophthalmologist: Dr. Merrell Abate  Pre-Procedure History & Physical: HPI:  [name] is a 83 y.o. female here for cataract surgery.   Past Medical History:  Diagnosis Date   AICD (automatic cardioverter/defibrillator) present    Anxiety    Aortic atherosclerosis (HCC)    Arthritis    CAD (coronary artery disease)    a. cardiac cath 10/2014: ostial LAD 30%, prox LAD 90% s/p PCI/DES, ostial RCA 30%, mid RCA 90% s/p PCI/DES, ostial Ramus 50%   Cardiac resynchronization therapy pacemaker (CRT-P) in place    Chronic back pain    Chronic fatigue    Chronic systolic CHF (congestive heart failure) (HCC)    a. 10/2014 Echo: EF 20%; b. 06/2017 Echo: EF 20-25%, diff HK, Gr2 DD, Mild AI. Mod to sev MR. Mod dil LA.   Combined systolic and diastolic heart failure (HCC)    COPD (chronic obstructive pulmonary disease) (HCC)    Essential hypertension    Hepatitis C    History of heart artery stent    Ischemic cardiomyopathy    a. 10/2014 Echo: EF 20%; b. 06/2017 Echo: EF 20-25%.   LBBB (left bundle branch block)    Mild aortic regurgitation    Moderate pulmonary hypertension (HCC)    Moderate to Severe Mitral regurgitation    a. 06/2017 Echo: Mod-Sev MR.   Osteoporosis    Severe mitral regurgitation by prior echocardiogram    UTI (lower urinary tract infection)     Past Surgical History:  Procedure Laterality Date   BACK SURGERY  09/01/2012   T10, L1   BIV PACEMAKER INSERTION CRT-P N/A 01/02/2022   Procedure: BIV PACEMAKER INSERTION CRT-P;  Surgeon: Tammie Fall, MD;  Location: [facility] INVASIVE CV LAB;  Service: Cardiovascular;  Laterality: N/A;   CARDIAC CATHETERIZATION Bilateral 10/27/2014   Procedure: Left Heart Cath and Coronary Angiography;  Surgeon: Devorah Fonder, MD;  Location: [facility] INVASIVE CV LAB;  Service: Cardiovascular;  Laterality: Bilateral;   CARDIAC CATHETERIZATION N/A 10/27/2014   Procedure: Coronary Stent  Intervention;  Surgeon: Wenona Hamilton, MD;  Location: [facility] INVASIVE CV LAB;  Service: Cardiovascular;  Laterality: N/A;   CORONARY PRESSURE/FFR STUDY N/A 06/13/2021   Procedure: INTRAVASCULAR PRESSURE WIRE/FFR STUDY;  Surgeon: Sammy Crisp, MD;  Location: [facility] INVASIVE CV LAB;  Service: Cardiovascular;  Laterality: N/A;   CORONARY STENT INTERVENTION N/A 06/13/2021   Procedure: CORONARY STENT INTERVENTION;  Surgeon: Sammy Crisp, MD;  Location: [facility] INVASIVE CV LAB;  Service: Cardiovascular;  Laterality: N/A;   HIP ARTHROPLASTY Left 06/07/2021   Procedure: ARTHROPLASTY BIPOLAR HIP (HEMIARTHROPLASTY);  Surgeon: Lorri Rota, MD;  Location: [facility] ORS;  Service: Orthopedics;  Laterality: Left;   PACEMAKER IMPLANT  01/2022   RIGHT/LEFT HEART CATH AND CORONARY ANGIOGRAPHY N/A 06/13/2021   Procedure: RIGHT/LEFT HEART CATH AND CORONARY ANGIOGRAPHY;  Surgeon: Sammy Crisp, MD;  Location: [facility] INVASIVE CV LAB;  Service: Cardiovascular;  Laterality: N/A;    Prior to Admission medications   Medication Sig Start Date End Date Taking? Authorizing Provider  ALPRAZolam  (XANAX ) 0.25 MG tablet Take 1 tablet (0.25 mg total) by mouth daily as needed for anxiety. 11/22/22  Yes Valli Gaw, MD  cetirizine  (ZYRTEC ) 10 MG tablet Take 1 tablet (10 mg total) by mouth daily. 07/24/22  Yes Valli Gaw, MD  clopidogrel  (PLAVIX ) 75 MG tablet TAKE ONE TABLET BY MOUTH ONE TIME DAILY 07/11/23  Yes Gollan, Timothy J, MD  ipratropium (ATROVENT ) 0.03 %  nasal spray Place 2 sprays into both nostrils every 12 (twelve) hours. 05/19/23  Yes Valli Gaw, MD  metoprolol  succinate (TOPROL -XL) 25 MG 24 hr tablet TAKE ONE-HALF TABLET BY MOUTH ONE TIME DAILY 03/21/23  Yes Gollan, Timothy J, MD  ondansetron  (ZOFRAN -ODT) 4 MG disintegrating tablet TAKE 1 TABLET BY MOUTH TWICE A DAY AS NEEDED FOR NAUSEA AND VOMITING appt further refills no exceptions 06/25/21  Yes McLean-Scocuzza, Karon Packer, MD  oxyCODONE  (ROXICODONE ) 5 MG immediate  release tablet Take 1 tablet (5 mg total) by mouth daily as needed for severe pain (pain score 7-10). 05/16/23  Yes Valli Gaw, MD  rosuvastatin  (CRESTOR ) 40 MG tablet TAKE ONE TABLET BY MOUTH ONE TIME DAILY 01/15/23  Yes Gollan, Timothy J, MD  torsemide  (DEMADEX ) 20 MG tablet TAKE ONE TABLET BY MOUTH ONE TIME DAILY 07/11/23  Yes Gollan, Timothy J, MD  denosumab  (PROLIA ) 60 MG/ML SOSY injection Inject 60 mg into the skin every 6 (six) months. 11/06/17   McLean-Scocuzza, Karon Packer, MD    Allergies as of 07/11/2023 - Review Complete 05/19/2023  Allergen Reaction Noted   Ipratropium  07/16/2022   Sugar-protein-starch Nausea And Vomiting 09/08/2012    Family History  Problem Relation Age of Onset   Arthritis Mother    Heart disease Father        passed at 64   Diabetes Father     Social History   Socioeconomic History   Marital status: Married    Spouse name: Kelda Azad   Number of children: 0   Years of education: 14   Highest education level: Not on file  Occupational History   Occupation: Receptionist    Comment: Retired in 1990  Tobacco Use   Smoking status: Former    Current packs/day: 1.00    Average packs/day: 1 pack/day for 10.0 years (10.0 ttl pk-yrs)    Types: Cigarettes   Smokeless tobacco: Never  Vaping Use   Vaping status: Never Used  Substance and Sexual Activity   Alcohol use: No    Alcohol/week: 0.0 standard drinks of alcohol   Drug use: No   Sexual activity: Yes    Birth control/protection: Post-menopausal  Other Topics Concern   Not on file  Social History Narrative   Pt is 83 yo female, married to husband of 21 years.  Pt has no children.  Pt lives with husband and teacup poodle, [name]. 75 y.o as of 01/01/19.  Pt states she hurt her back last year and has not been able to participate in much physical activity since. Pt enjoys traveling with her husband in motor home, doing crossword puzzles with husband, and playing cards with friends.  Pt was raised in [place] and has lived in [place] entire adult life.  Pt worked in [employer] in [place] until [place] when they closed.     Social Drivers of Corporate investment banker Strain: Low Risk  (08/28/2022)   Overall Financial Resource Strain (CARDIA)    Difficulty of Paying Living Expenses: Not hard at all  Food Insecurity: No Food Insecurity (08/28/2022)   Hunger Vital Sign    Worried About Running Out of Food in the Last Year: Never true    Ran Out of Food in the Last Year: Never true  Transportation Needs: No Transportation Needs (08/28/2022)   PRAPARE - Administrator, Civil Service (Medical): No    Lack of Transportation (Non-Medical): No  Physical Activity: Insufficiently Active (08/28/2022)   Exercise  Vital Sign    Days of Exercise per Week: 3 days    Minutes of Exercise per Session: 20 min  Stress: No Stress Concern Present (08/28/2022)   [employer] of Occupational Health - Occupational Stress Questionnaire    Feeling of Stress : Not at all  Social Connections: Moderately Isolated (08/28/2022)   Social Connection and Isolation Panel [NHANES]    Frequency of Communication with Friends and Family: More than three times a week    Frequency of Social Gatherings with Friends and Family: Once a week    Attends Religious Services: Never    Database administrator or Organizations: No    Attends Banker Meetings: Never    Marital Status: Married  Catering manager Violence: Not At Risk (08/28/2022)   Humiliation, Afraid, Rape, and Kick questionnaire    Fear of Current or Ex-Partner: No    Emotionally Abused: No    Physically Abused: No    Sexually Abused: No    Review of Systems: See HPI, otherwise negative ROS  Physical Exam: BP (!) 154/81   Pulse 95   Temp 97.9 F (36.6 C) (Temporal)   Resp 16   Ht 5' (1.524 m)   Wt 52.2 kg   SpO2 99%   BMI 22.46 kg/m  General:   Alert, cooperative. Head:  Normocephalic and atraumatic. Respiratory:  Normal  work of breathing. Cardiovascular:  NAD  Impression/Plan: [name] is here for cataract surgery.  Risks, benefits, limitations, and alternatives regarding cataract surgery have been reviewed with the patient.  Questions have been answered.  All parties agreeable.   Clair Crews, MD  08/12/2023, 7:47 AM

## 2023-08-12 NOTE — Anesthesia Postprocedure Evaluation (Signed)
 Anesthesia Post Note  Patient: [name]  Procedure(s) Performed: PHACOEMULSIFICATION, CATARACT, WITH IOL INSERTION 8.15 00:51.1 (Left: Eye)  Patient location during evaluation: PACU Anesthesia Type: MAC Level of consciousness: awake and alert Pain management: pain level controlled Vital Signs Assessment: post-procedure vital signs reviewed and stable Respiratory status: spontaneous breathing, nonlabored ventilation, respiratory function stable and patient connected to nasal cannula oxygen Cardiovascular status: stable and blood pressure returned to baseline Postop Assessment: no apparent nausea or vomiting Anesthetic complications: no   No notable events documented.   Last Vitals:  Vitals:   08/12/23 0811 08/12/23 0815  BP: (!) 110/97 (!) 137/55  Pulse: 87 84  Resp: (!) 27 (!) 22  Temp: 36.5 C 36.5 C  SpO2: 98% 96%    Last Pain:  Vitals:   08/12/23 0815  TempSrc:   PainSc: 0-No pain                 Renezmae Canlas C Oliwia Berzins

## 2023-08-14 NOTE — Progress Notes (Signed)
 Remote pacemaker transmission.

## 2023-10-02 ENCOUNTER — Ambulatory Visit: Payer: Medicare HMO

## 2023-10-02 DIAGNOSIS — I255 Ischemic cardiomyopathy: Secondary | ICD-10-CM | POA: Diagnosis not present

## 2023-10-03 LAB — CUP PACEART REMOTE DEVICE CHECK
Battery Remaining Longevity: 71 mo
Battery Remaining Percentage: 77 %
Battery Voltage: 2.99 V
Brady Statistic AP VP Percent: 4.6 %
Brady Statistic AP VS Percent: 1 %
Brady Statistic AS VP Percent: 95 %
Brady Statistic AS VS Percent: 1 %
Brady Statistic RA Percent Paced: 4.3 %
Date Time Interrogation Session: 20250731020013
Implantable Lead Connection Status: 753985
Implantable Lead Connection Status: 753985
Implantable Lead Connection Status: 753985
Implantable Lead Implant Date: 20231101
Implantable Lead Implant Date: 20231101
Implantable Lead Implant Date: 20231101
Implantable Lead Location: 753858
Implantable Lead Location: 753859
Implantable Lead Location: 753860
Implantable Pulse Generator Implant Date: 20231101
Lead Channel Impedance Value: 1000 Ohm
Lead Channel Impedance Value: 310 Ohm
Lead Channel Impedance Value: 440 Ohm
Lead Channel Pacing Threshold Amplitude: 0.5 V
Lead Channel Pacing Threshold Amplitude: 0.625 V
Lead Channel Pacing Threshold Amplitude: 1.25 V
Lead Channel Pacing Threshold Pulse Width: 0.5 ms
Lead Channel Pacing Threshold Pulse Width: 0.5 ms
Lead Channel Pacing Threshold Pulse Width: 0.5 ms
Lead Channel Sensing Intrinsic Amplitude: 1.3 mV
Lead Channel Sensing Intrinsic Amplitude: 8.4 mV
Lead Channel Setting Pacing Amplitude: 1.625
Lead Channel Setting Pacing Amplitude: 2 V
Lead Channel Setting Pacing Amplitude: 2.25 V
Lead Channel Setting Pacing Pulse Width: 0.5 ms
Lead Channel Setting Pacing Pulse Width: 0.5 ms
Lead Channel Setting Sensing Sensitivity: 2 mV
Pulse Gen Model: 3562
Pulse Gen Serial Number: 8124818

## 2023-10-06 ENCOUNTER — Ambulatory Visit: Payer: Self-pay | Admitting: Cardiology

## 2023-10-09 ENCOUNTER — Telehealth: Payer: Self-pay

## 2023-10-09 NOTE — Telephone Encounter (Signed)
 [employer] chronic condition versification form received needing provider signature form placed in provider to be signed folder

## 2023-10-10 NOTE — Telephone Encounter (Signed)
 Noted

## 2023-10-10 NOTE — Telephone Encounter (Signed)
 Copied from CRM #[id number]. Topic: General - Other >> Oct 10, 2023  9:41 AM Viola F wrote: Reason for CRM: Tamera from [facility] called to confirm that chronic condition verification form that was faxed yesterday was received. Please call [phone]  I called [employer] and spoke Kaitlyn.  I let her know that the form was received and we are waiting for a signature from provider, who is not in the office today.

## 2023-10-13 NOTE — Telephone Encounter (Signed)
 Form faxed with a completed transmission log

## 2023-10-23 ENCOUNTER — Ambulatory Visit (INDEPENDENT_AMBULATORY_CARE_PROVIDER_SITE_OTHER): Admitting: Nurse Practitioner

## 2023-10-23 ENCOUNTER — Encounter: Payer: Self-pay | Admitting: Nurse Practitioner

## 2023-10-23 VITALS — BP 128/66 | HR 78 | Temp 97.7°F | Resp 20 | Ht 60.0 in | Wt 116.0 lb

## 2023-10-23 DIAGNOSIS — F419 Anxiety disorder, unspecified: Secondary | ICD-10-CM

## 2023-10-23 DIAGNOSIS — E782 Mixed hyperlipidemia: Secondary | ICD-10-CM | POA: Diagnosis not present

## 2023-10-23 DIAGNOSIS — I10 Essential (primary) hypertension: Secondary | ICD-10-CM | POA: Diagnosis not present

## 2023-10-23 DIAGNOSIS — G8929 Other chronic pain: Secondary | ICD-10-CM | POA: Diagnosis not present

## 2023-10-23 DIAGNOSIS — J310 Chronic rhinitis: Secondary | ICD-10-CM

## 2023-10-23 DIAGNOSIS — I25810 Atherosclerosis of coronary artery bypass graft(s) without angina pectoris: Secondary | ICD-10-CM | POA: Diagnosis not present

## 2023-10-23 DIAGNOSIS — R7303 Prediabetes: Secondary | ICD-10-CM | POA: Diagnosis not present

## 2023-10-23 DIAGNOSIS — N1832 Chronic kidney disease, stage 3b: Secondary | ICD-10-CM

## 2023-10-23 DIAGNOSIS — M546 Pain in thoracic spine: Secondary | ICD-10-CM | POA: Diagnosis not present

## 2023-10-23 DIAGNOSIS — I5020 Unspecified systolic (congestive) heart failure: Secondary | ICD-10-CM | POA: Diagnosis not present

## 2023-10-23 DIAGNOSIS — M810 Age-related osteoporosis without current pathological fracture: Secondary | ICD-10-CM

## 2023-10-23 DIAGNOSIS — Z9181 History of falling: Secondary | ICD-10-CM

## 2023-10-23 LAB — CBC WITH DIFFERENTIAL/PLATELET
Basophils Absolute: 0 K/uL (ref 0.0–0.1)
Basophils Relative: 0.3 % (ref 0.0–3.0)
Eosinophils Absolute: 0 K/uL (ref 0.0–0.7)
Eosinophils Relative: 0.7 % (ref 0.0–5.0)
HCT: 42.8 % (ref 36.0–46.0)
Hemoglobin: 14 g/dL (ref 12.0–15.0)
Lymphocytes Relative: 18.6 % (ref 12.0–46.0)
Lymphs Abs: 1.3 K/uL (ref 0.7–4.0)
MCHC: 32.7 g/dL (ref 30.0–36.0)
MCV: 94.3 fl (ref 78.0–100.0)
Monocytes Absolute: 0.4 K/uL (ref 0.1–1.0)
Monocytes Relative: 5.4 % (ref 3.0–12.0)
Neutro Abs: 5.3 K/uL (ref 1.4–7.7)
Neutrophils Relative %: 75 % (ref 43.0–77.0)
Platelets: 197 K/uL (ref 150.0–400.0)
RBC: 4.53 Mil/uL (ref 3.87–5.11)
RDW: 14 % (ref 11.5–15.5)
WBC: 7.1 K/uL (ref 4.0–10.5)

## 2023-10-23 LAB — COMPREHENSIVE METABOLIC PANEL WITH GFR
ALT: 16 U/L (ref 0–35)
AST: 22 U/L (ref 0–37)
Albumin: 4.3 g/dL (ref 3.5–5.2)
Alkaline Phosphatase: 55 U/L (ref 39–117)
BUN: 29 mg/dL — ABNORMAL HIGH (ref 6–23)
CO2: 33 meq/L — ABNORMAL HIGH (ref 19–32)
Calcium: 9.8 mg/dL (ref 8.4–10.5)
Chloride: 100 meq/L (ref 96–112)
Creatinine, Ser: 1.02 mg/dL (ref 0.40–1.20)
GFR: 51.39 mL/min — ABNORMAL LOW (ref >60.00)
Glucose, Bld: 98 mg/dL (ref 70–99)
Potassium: 4.6 meq/L (ref 3.5–5.1)
Sodium: 142 meq/L (ref 135–145)
Total Bilirubin: 0.5 mg/dL (ref 0.2–1.2)
Total Protein: 7.2 g/dL (ref 6.0–8.3)

## 2023-10-23 LAB — LIPID PANEL
Cholesterol: 150 mg/dL (ref 0–200)
HDL: 48.6 mg/dL (ref >39.00)
LDL Cholesterol: 52 mg/dL (ref 0–99)
NonHDL: 100.92
Total CHOL/HDL Ratio: 3
Triglycerides: 245 mg/dL — ABNORMAL HIGH (ref 0.0–149.0)
VLDL: 49 mg/dL — ABNORMAL HIGH (ref 0.0–40.0)

## 2023-10-23 LAB — VITAMIN D 25 HYDROXY (VIT D DEFICIENCY, FRACTURES): VITD: 59.62 ng/mL (ref 30.00–100.00)

## 2023-10-23 LAB — TSH: TSH: 1.65 u[IU]/mL (ref 0.35–5.50)

## 2023-10-23 LAB — HEMOGLOBIN A1C: Hgb A1c MFr Bld: 6 % (ref 4.6–6.5)

## 2023-10-23 MED ORDER — ALPRAZOLAM 0.25 MG PO TABS: 0.2500 mg | ORAL_TABLET | Freq: Every day | ORAL | 0 refills | Status: AC | PRN

## 2023-10-23 MED ORDER — OXYCODONE HCL 5 MG PO TABS
5.0000 mg | ORAL_TABLET | Freq: Every day | ORAL | 0 refills | Status: AC | PRN
Start: 2023-10-23 — End: ?

## 2023-10-23 MED ORDER — IPRATROPIUM BROMIDE 0.03 % NA SOLN: 2.0000 | Freq: Two times a day (BID) | NASAL | 12 refills | Status: AC

## 2023-10-23 NOTE — Progress Notes (Signed)
 Kelly Glance, NP-C Phone: [phone]  [name] is a 83 y.o. female who presents today for transfer of care.   Discussed the use of AI scribe software for clinical note transcription with the patient, who gave verbal consent to proceed.  History of Present Illness   [name] is an 83 year old female who presents for transfer of care and medication refills. She is accompanied by her husband, Zell.  She regularly sees Dr. Gerre for cardiology every six months, with her last visit in May. Her medication regimen includes Prolia  injections for osteoporosis, Plavix , metoprolol  (half tablet daily), torsemide , and Crestor . She monitors her blood pressure at home, typically recording readings around 120/60 mmHg. She denies chest pain, shortness of breath, dizziness, or swelling.  She uses ipratropium nasal spray for a runny nose, which she finds effective but notes it causes her to lose her voice. She discovered this side effect after experiencing voice loss during a hospital stay for a broken hip. She does not take cetirizine  daily.  She takes oxycodone  infrequently, about once a week or less, for severe back pain that sometimes prevents her from standing or showering. She also uses Xanax  very rarely, about one to two times a month, at a low dose.  She experiences significant fluid retention and takes torsemide  twice a day, though she skipped her dose today due to the visit. She uses a walker and has safety measures in place at home, including bars in the bathroom, to manage frequent bathroom trips due to the diuretic effect of torsemide .  She has a pacemaker and mentions having a bedside commode, though it is not currently in use.      Social History   Tobacco Use  Smoking Status Former   Current packs/day: 1.00   Average packs/day: 1 pack/day for 10.0 years (10.0 ttl pk-yrs)   Types: Cigarettes  Smokeless Tobacco Never    Current Outpatient Medications on File Prior to Visit   Medication Sig Dispense Refill   clopidogrel  (PLAVIX ) 75 MG tablet TAKE ONE TABLET BY MOUTH ONE TIME DAILY 90 tablet 3   denosumab  (PROLIA ) 60 MG/ML SOSY injection Inject 60 mg into the skin every 6 (six) months. 1 mL 2   metoprolol  succinate (TOPROL -XL) 25 MG 24 hr tablet TAKE ONE-HALF TABLET BY MOUTH ONE TIME DAILY 45 tablet 3   ondansetron  (ZOFRAN -ODT) 4 MG disintegrating tablet TAKE 1 TABLET BY MOUTH TWICE A DAY AS NEEDED FOR NAUSEA AND VOMITING appt further refills no exceptions 60 tablet 11   rosuvastatin  (CRESTOR ) 40 MG tablet TAKE ONE TABLET BY MOUTH ONE TIME DAILY 90 tablet 3   torsemide  (DEMADEX ) 20 MG tablet TAKE ONE TABLET BY MOUTH ONE TIME DAILY 90 tablet 3   No current facility-administered medications on file prior to visit.     ROS see history of present illness  Objective  Physical Exam Vitals:   10/23/23 0939  BP: 128/66  Pulse: 78  Resp: 20  Temp: 97.7 F (36.5 C)  SpO2: 99%    BP Readings from Last 3 Encounters:  10/23/23 128/66  08/12/23 (!) 137/55  05/19/23 (!) 140/60   Wt Readings from Last 3 Encounters:  10/23/23 116 lb (52.6 kg)  08/12/23 115 lb (52.2 kg)  05/19/23 118 lb 2 oz (53.6 kg)    Physical Exam Constitutional:      General: She is not in acute distress.    Appearance: Normal appearance.  HENT:     Head: Normocephalic.  Cardiovascular:  Rate and Rhythm: Normal rate and regular rhythm.     Heart sounds: Normal heart sounds.  Pulmonary:     Effort: Pulmonary effort is normal.     Breath sounds: Normal breath sounds.  Skin:    General: Skin is warm and dry.  Neurological:     General: No focal deficit present.     Mental Status: She is alert.  Psychiatric:        Mood and Affect: Mood normal.        Behavior: Behavior normal.      Assessment/Plan: Please see individual problem list.  HFrEF (heart failure with reduced ejection fraction) (HCC) Assessment & Plan: Chronic heart failure is managed with torsemide  and  metoprolol . She reports no symptoms of chest pain, shortness of breath, dizziness, or swelling. Fluid retention is controlled with torsemide . Emphasize safety at home due to frequent urination from torsemide . Continue torsemide  and metoprolol  as prescribed. Follow up with Cardiology as scheduled.    Coronary artery disease involving coronary bypass graft of native heart without angina pectoris Assessment & Plan: Coronary artery disease with stent placement and pacemaker is managed with Plavix  and Crestor . She has regular cardiology follow-ups every six months, with the last visit in May. Continue Plavix  and Crestor  as prescribed and follow up with cardiology every six months.   Chronic bilateral thoracic back pain Assessment & Plan: Chronic back pain is managed with oxycodone  as needed. Usage is infrequent, approximately once a week or less, for severe pain episodes. Continue oxycodone  5 mg as needed for severe pain x 30 tablets. PDMP reviewed. We will continue to monitor.   Orders: -     oxyCODONE  HCl; Take 1 tablet (5 mg total) by mouth daily as needed for severe pain (pain score 7-10).  Dispense: 30 tablet; Refill: 0  Anxiety Assessment & Plan: Generalized anxiety disorder is managed with Xanax  on a rare as-needed basis, approximately one to two times a month. Counseled on common side effects. Continue Xanax  0.25 mg as needed for anxiety. PDMP reviewed.   Orders: -     TSH -     ALPRAZolam ; Take 1 tablet (0.25 mg total) by mouth daily as needed for anxiety.  Dispense: 30 tablet; Refill: 0  Essential hypertension Assessment & Plan: Hypertension is managed with torsemide  and metoprolol . Blood pressure is approximately 120/60 mmHg. She reports no symptoms of chest pain, shortness of breath, dizziness, or swelling. Continue torsemide  and metoprolol  as prescribed. Follow up with Cardiology.   Orders: -     CBC with Differential/Platelet  Osteoporosis, unspecified osteoporosis type,  unspecified pathological fracture presence Assessment & Plan: Osteoporosis is managed with Prolia  injections. There are no new concerns or changes in treatment. Continue Prolia  injections as scheduled.  Orders: -     VITAMIN D  25 Hydroxy (Vit-D Deficiency, Fractures)  Mixed hyperlipidemia Assessment & Plan: Hyperlipidemia is managed with Crestor . There are no new concerns or changes in treatment. Continue Crestor  as prescribed.  Orders: -     Lipid panel  Rhinitis, unspecified type Assessment & Plan: Allergic rhinitis is managed with ipratropium nasal spray. It is effective for a runny nose but causes voice loss, identified as a side effect. Continue ipratropium nasal spray as needed.   Orders: -     Ipratropium Bromide ; Place 2 sprays into both nostrils every 12 (twelve) hours.  Dispense: 30 mL; Refill: 12  CKD stage 3b, GFR 30-44 ml/min (HCC) -     Comprehensive metabolic panel with GFR  Prediabetes -  Hemoglobin A1c      Return if symptoms worsen or fail to improve.   Kelly Glance, NP-C [facility]

## 2023-10-29 ENCOUNTER — Ambulatory Visit: Payer: Self-pay | Admitting: Nurse Practitioner

## 2023-10-30 ENCOUNTER — Encounter: Payer: Self-pay | Admitting: Nurse Practitioner

## 2023-10-30 NOTE — Assessment & Plan Note (Signed)
 Osteoporosis is managed with Prolia  injections. There are no new concerns or changes in treatment. Continue Prolia  injections as scheduled.

## 2023-10-30 NOTE — Assessment & Plan Note (Signed)
 Coronary artery disease with stent placement and pacemaker is managed with Plavix  and Crestor . She has regular cardiology follow-ups every six months, with the last visit in May. Continue Plavix  and Crestor  as prescribed and follow up with cardiology every six months.

## 2023-10-30 NOTE — Assessment & Plan Note (Signed)
 Hyperlipidemia is managed with Crestor . There are no new concerns or changes in treatment. Continue Crestor  as prescribed.

## 2023-10-30 NOTE — Assessment & Plan Note (Signed)
 Generalized anxiety disorder is managed with Xanax  on a rare as-needed basis, approximately one to two times a month. Counseled on common side effects. Continue Xanax  0.25 mg as needed for anxiety. PDMP reviewed.

## 2023-10-30 NOTE — Assessment & Plan Note (Signed)
 Hypertension is managed with torsemide  and metoprolol . Blood pressure is approximately 120/60 mmHg. She reports no symptoms of chest pain, shortness of breath, dizziness, or swelling. Continue torsemide  and metoprolol  as prescribed. Follow up with Cardiology.

## 2023-10-30 NOTE — Assessment & Plan Note (Signed)
 Chronic back pain is managed with oxycodone  as needed. Usage is infrequent, approximately once a week or less, for severe pain episodes. Continue oxycodone  5 mg as needed for severe pain x 30 tablets. PDMP reviewed. We will continue to monitor.

## 2023-10-30 NOTE — Assessment & Plan Note (Signed)
 Allergic rhinitis is managed with ipratropium nasal spray. It is effective for a runny nose but causes voice loss, identified as a side effect. Continue ipratropium nasal spray as needed.

## 2023-10-30 NOTE — Assessment & Plan Note (Addendum)
 Chronic heart failure is managed with torsemide  and metoprolol . She reports no symptoms of chest pain, shortness of breath, dizziness, or swelling. Fluid retention is controlled with torsemide . Emphasize safety at home due to frequent urination from torsemide . Continue torsemide  and metoprolol  as prescribed. Follow up with Cardiology as scheduled.

## 2023-11-05 ENCOUNTER — Other Ambulatory Visit: Payer: Self-pay | Admitting: Cardiovascular Disease

## 2023-12-03 NOTE — Progress Notes (Signed)
 Remote PPM Transmission

## 2023-12-23 ENCOUNTER — Telehealth: Payer: Self-pay

## 2023-12-23 NOTE — Telephone Encounter (Signed)
 Copied from CRM #[id number]. Topic: Clinical - Medical Advice >> Dec 23, 2023 11:45 AM [name] wrote: Reason for CRM: Patient called in regarding wanting to know when is it time for her next prolia  shot, would like a callback regarding this

## 2023-12-24 NOTE — Telephone Encounter (Unsigned)
 Copied from CRM [id number]. Topic: Clinical - Medication Question >> Dec 24, 2023 10:51 AM Mercedes MATSU wrote: Reason for CRM: Patient is requesting a call back, she has questions about the generic versions of the prolia  shot. Can be reached at [phone].

## 2023-12-25 ENCOUNTER — Telehealth: Payer: Self-pay | Admitting: *Deleted

## 2023-12-25 DIAGNOSIS — M810 Age-related osteoporosis without current pathological fracture: Secondary | ICD-10-CM

## 2023-12-25 MED ORDER — DENOSUMAB-BBDZ 60 MG/ML ~~LOC~~ SOSY
60.0000 mg | PREFILLED_SYRINGE | SUBCUTANEOUS | Status: AC
  Administered 2024-01-26: 60 mg via SUBCUTANEOUS

## 2023-12-27 ENCOUNTER — Encounter: Payer: Self-pay | Admitting: Nurse Practitioner

## 2023-12-29 ENCOUNTER — Telehealth: Payer: Self-pay

## 2023-12-29 ENCOUNTER — Other Ambulatory Visit (HOSPITAL_COMMUNITY): Payer: Self-pay

## 2023-12-29 NOTE — Telephone Encounter (Signed)
 Prolia  VOB initiated via [email or web address]  Next Prolia  inj DUE: 01/25/24

## 2023-12-30 ENCOUNTER — Other Ambulatory Visit: Payer: Self-pay

## 2023-12-30 ENCOUNTER — Other Ambulatory Visit (HOSPITAL_COMMUNITY): Payer: Self-pay

## 2023-12-31 ENCOUNTER — Other Ambulatory Visit (HOSPITAL_COMMUNITY): Payer: Self-pay

## 2023-12-31 NOTE — Telephone Encounter (Signed)
 Pt would like benefits ran for both Prolia  and [name]. And would like to go with the best price.   Thanks

## 2023-12-31 NOTE — Telephone Encounter (Signed)
 [name]

## 2023-12-31 NOTE — Telephone Encounter (Signed)
 Amount Due and other usual information not listed. Pt is asking for the amount she owes. Was told by her insurance it should only be about $100.

## 2023-12-31 NOTE — Telephone Encounter (Signed)
 Spoke with patient and we are going to check benefits for both [name] & Prolia  for the best price options

## 2024-01-01 ENCOUNTER — Ambulatory Visit (INDEPENDENT_AMBULATORY_CARE_PROVIDER_SITE_OTHER): Payer: Medicare HMO

## 2024-01-01 DIAGNOSIS — I255 Ischemic cardiomyopathy: Secondary | ICD-10-CM

## 2024-01-01 LAB — CUP PACEART REMOTE DEVICE CHECK
Battery Remaining Longevity: 70 mo
Battery Remaining Percentage: 74 %
Battery Voltage: 2.99 V
Brady Statistic AP VP Percent: 6 %
Brady Statistic AP VS Percent: 1 %
Brady Statistic AS VP Percent: 93 %
Brady Statistic AS VS Percent: 1 %
Brady Statistic RA Percent Paced: 5.8 %
Date Time Interrogation Session: 20251030020009
Implantable Lead Connection Status: 753985
Implantable Lead Connection Status: 753985
Implantable Lead Connection Status: 753985
Implantable Lead Implant Date: 20231101
Implantable Lead Implant Date: 20231101
Implantable Lead Implant Date: 20231101
Implantable Lead Location: 753858
Implantable Lead Location: 753859
Implantable Lead Location: 753860
Implantable Pulse Generator Implant Date: 20231101
Lead Channel Impedance Value: 1075 Ohm
Lead Channel Impedance Value: 330 Ohm
Lead Channel Impedance Value: 460 Ohm
Lead Channel Pacing Threshold Amplitude: 0.5 V
Lead Channel Pacing Threshold Amplitude: 0.625 V
Lead Channel Pacing Threshold Amplitude: 1.125 V
Lead Channel Pacing Threshold Pulse Width: 0.5 ms
Lead Channel Pacing Threshold Pulse Width: 0.5 ms
Lead Channel Pacing Threshold Pulse Width: 0.5 ms
Lead Channel Sensing Intrinsic Amplitude: 2 mV
Lead Channel Sensing Intrinsic Amplitude: 9.4 mV
Lead Channel Setting Pacing Amplitude: 1.625
Lead Channel Setting Pacing Amplitude: 2 V
Lead Channel Setting Pacing Amplitude: 2.125
Lead Channel Setting Pacing Pulse Width: 0.5 ms
Lead Channel Setting Pacing Pulse Width: 0.5 ms
Lead Channel Setting Sensing Sensitivity: 2 mV
Pulse Gen Model: 3562
Pulse Gen Serial Number: 8124818

## 2024-01-02 ENCOUNTER — Other Ambulatory Visit (HOSPITAL_COMMUNITY): Payer: Self-pay

## 2024-01-02 ENCOUNTER — Ambulatory Visit: Payer: Self-pay | Admitting: Cardiology

## 2024-01-02 NOTE — Telephone Encounter (Addendum)
 Called [employer] to check benefits for [name]. Per representative, NO DEDUCTIBLE, 20% COINSURANCE (APPROXIMATELY $300 TO $400). Prolia  still covered under medical (buy and bill). [name] is preferred for pharmacy. Completed referral for pharmacy benefit for [name], since pharmacy benefit is cheaper for the patient.

## 2024-01-02 NOTE — Telephone Encounter (Signed)
 Pt ready for scheduling for PROLIA  on or after : 01/25/24  Option# 1: Buy/Bill (Office supplied medication)  Out-of-pocket cost due at time of clinic visit: $347  Number of injection/visits approved: 2  Primary: [employer] Prolia  co-insurance: 20% Admin fee co-insurance: $15  Secondary: -- Prolia  co-insurance:  Admin fee co-insurance:   Medical Benefit Details: Date Benefits were checked: 12/30/23 Deductible: NO/ Coinsurance: 20%/ Admin Fee: $15  Prior Auth: APPROVED PA# [id number] Expiration Date: 01/02/24-01/01/25  # of doses approved: 2 ----------------------------------------------------------------------- Option# 2- Med Obtained from pharmacy: Prolia  is no longer preferred for pharmacy benefit. [name] is now preferred. PRICING IS FOR [name]  Pharmacy benefit: Copay $300 (Paid to pharmacy) Admin Fee: $15 (Pay at clinic)  Prior Auth: N/A PA# Expiration Date:   # of doses approved:   If patient wants fill through the pharmacy benefit please send prescription to: [facility], and include estimated need by date in rx notes. Pharmacy will ship medication directly to the office.  Patient NOT eligible for Prolia  Copay Card. Copay Card can make patient's cost as little as $25. Link to apply: [email or web address]://[email or web address]  ** This summary of benefits is an estimation of the patient's out-of-pocket cost. Exact cost may very based on individual plan coverage.

## 2024-01-06 NOTE — Progress Notes (Signed)
 Remote PPM Transmission

## 2024-01-07 ENCOUNTER — Other Ambulatory Visit: Payer: Self-pay

## 2024-01-07 ENCOUNTER — Other Ambulatory Visit: Payer: Self-pay | Admitting: Cardiovascular Disease

## 2024-01-07 ENCOUNTER — Other Ambulatory Visit: Payer: Self-pay | Admitting: *Deleted

## 2024-01-07 DIAGNOSIS — M810 Age-related osteoporosis without current pathological fracture: Secondary | ICD-10-CM

## 2024-01-07 MED ORDER — DENOSUMAB-BBDZ 60 MG/ML ~~LOC~~ SOSY
60.0000 mg | PREFILLED_SYRINGE | SUBCUTANEOUS | 1 refills | Status: AC
  Filled 2024-01-12: qty 1, 180d supply, fill #0

## 2024-01-07 NOTE — Telephone Encounter (Signed)
 Rx sent to [facility] due to best cost, sent mychart for pt to call [facility] to take care of payment & shipment

## 2024-01-07 NOTE — Progress Notes (Signed)
 Pharmacy Patient Advocate Encounter  Insurance verification completed.   The patient is insured through [employer] claim for [name]. Co-pay is $300.  This test claim was processed through [facility]- copay amounts may vary at other pharmacies due to pharmacy/plan contracts, or as the patient moves through the different stages of their insurance plan.

## 2024-01-08 ENCOUNTER — Other Ambulatory Visit: Payer: Self-pay

## 2024-01-08 NOTE — Telephone Encounter (Signed)
 Copied from CRM [id number]. Topic: Clinical - Medication Prior Auth >> Jan 08, 2024 12:41 PM Dedra B wrote: Reason for CRM: Richard from [facility] membership team, called to let pt's PCP know that pt will have a high deductible for [name] because it's a tier 4 drug. He suggested that you call the PA dept to lower down the tier. PA dept can be reached at [phone]. Richard can be reached [phone].

## 2024-01-09 ENCOUNTER — Other Ambulatory Visit: Payer: Self-pay | Admitting: Cardiovascular Disease

## 2024-01-09 MED ORDER — TORSEMIDE 20 MG PO TABS: 20.0000 mg | ORAL_TABLET | Freq: Every day | ORAL | 1 refills | Status: AC

## 2024-01-12 ENCOUNTER — Other Ambulatory Visit: Payer: Self-pay

## 2024-01-12 NOTE — Progress Notes (Signed)
 Specialty Pharmacy Initial Fill Coordination Note  [name] is a 83 y.o. female contacted today regarding initial fill of specialty medication(s) Denosumab -bbdz [facility]ARTON)   Patient requested Courier to Provider Office   Delivery date: 01/14/24   Verified address: [facility] [address]   Medication will be filled on: 01/13/24   Patient is aware of $300 copayment.

## 2024-01-13 ENCOUNTER — Other Ambulatory Visit: Payer: Self-pay

## 2024-01-24 ENCOUNTER — Other Ambulatory Visit: Payer: Self-pay | Admitting: Cardiovascular Disease

## 2024-01-26 ENCOUNTER — Ambulatory Visit (INDEPENDENT_AMBULATORY_CARE_PROVIDER_SITE_OTHER)

## 2024-01-26 VITALS — BP 132/52

## 2024-01-26 DIAGNOSIS — M810 Age-related osteoporosis without current pathological fracture: Secondary | ICD-10-CM

## 2024-01-26 MED ORDER — DENOSUMAB-BBDZ 60 MG/ML ~~LOC~~ SOSY: 60.0000 mg | PREFILLED_SYRINGE | Freq: Once | SUBCUTANEOUS | Status: AC

## 2024-01-26 NOTE — Progress Notes (Signed)
 After obtaining consent, and per orders of  Leron Glance, NP, injection of Jubbonit given SQ in left arm by Sebastian Rosina Helling. Patient instructed to remain in clinic for 15 minutes and nurse re-took BP after injection.  Patient tolerated injection well.

## 2024-02-05 NOTE — Telephone Encounter (Signed)
 Pt received [name]  on 01/26/24

## 2024-02-16 IMAGING — CT CT ABD-PELV W/O CM
2 of 4 series · 15 of 46 positions shown, 17 images · non-contrast
Comparison: None.

CLINICAL DATA: Liver failure and back pain.



[Series 2: routine abd/pel wo · axial · 0.92mm/px · z∈[-1101,-746]mm · 12 of 82 slices shown, 14 images]
[im 7/82  soft-tissue]
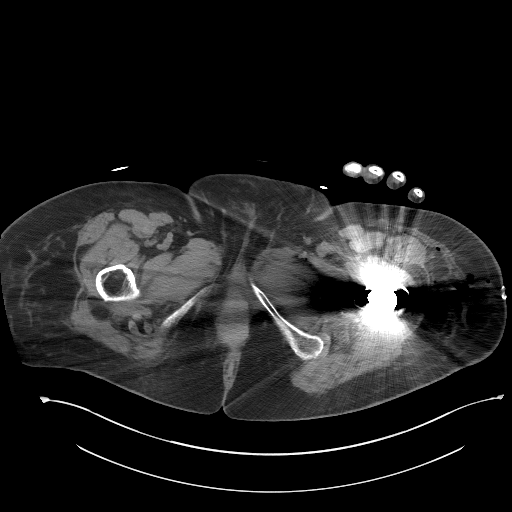
[im 7/82  bone]
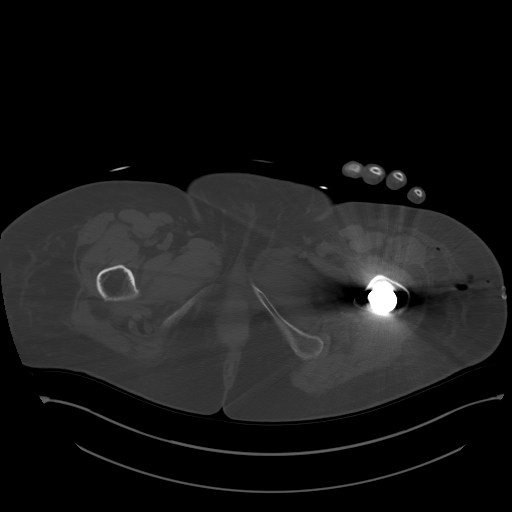
[im 13/82  soft-tissue]
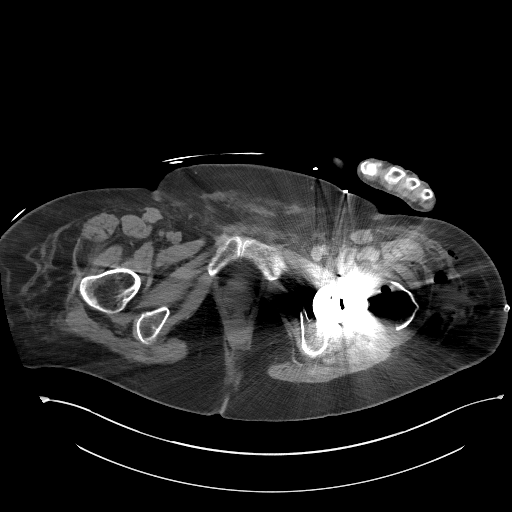
[im 20/82  soft-tissue]
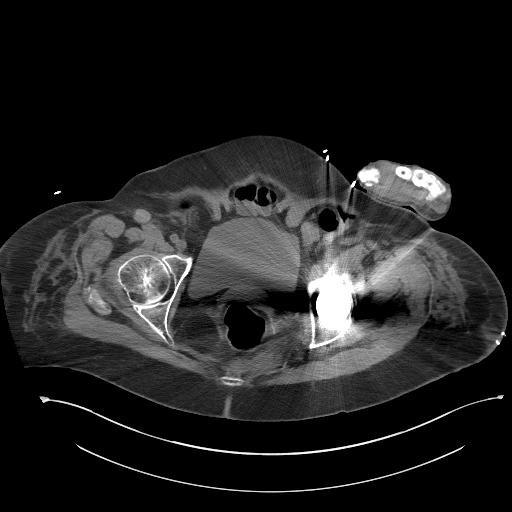
[im 26/82  soft-tissue]
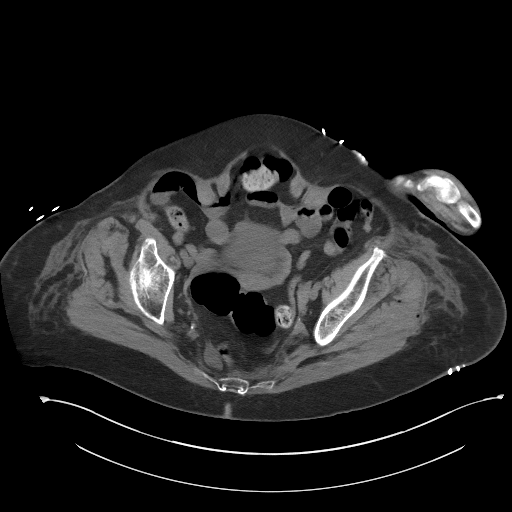
[im 33/82  soft-tissue]
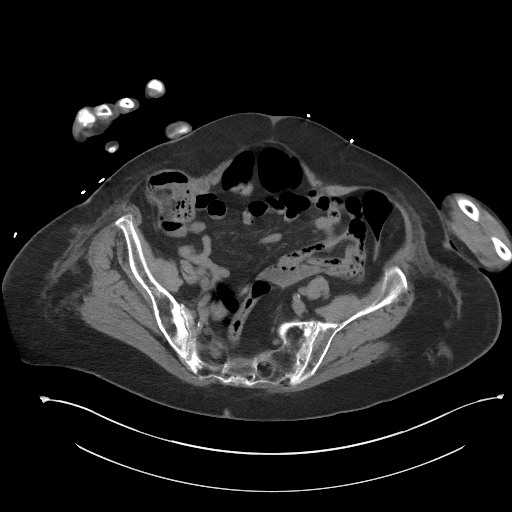
[im 39/82  soft-tissue]
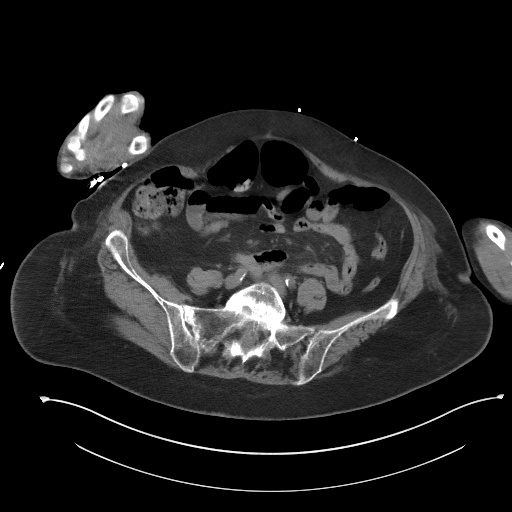
[im 46/82  soft-tissue]
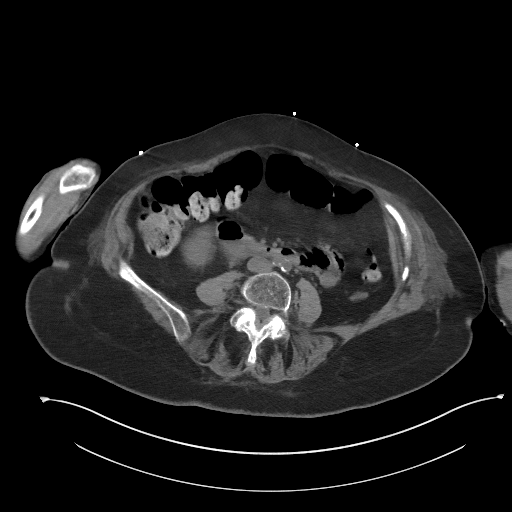
[im 52/82  soft-tissue]
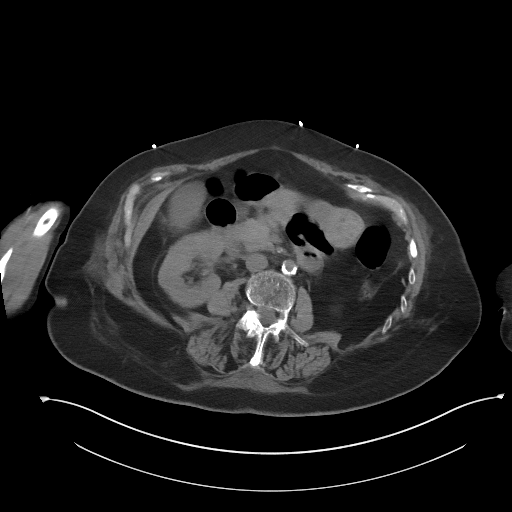
[im 59/82  soft-tissue]
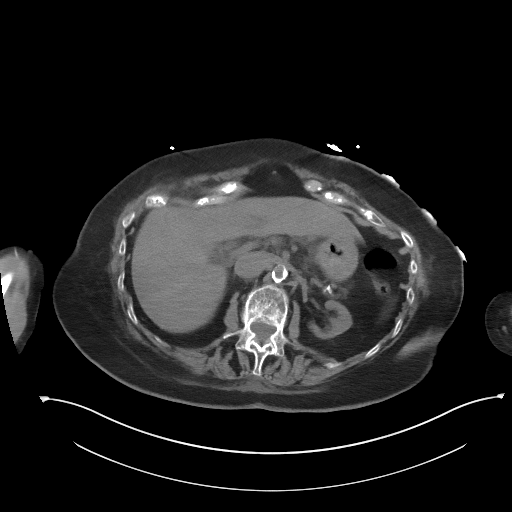
[im 59/82  bone]
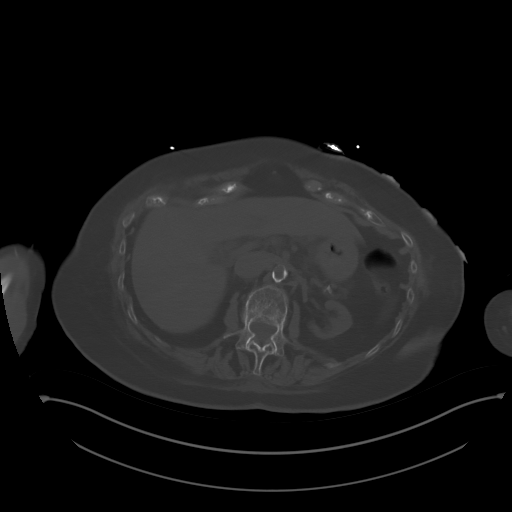
[im 65/82  soft-tissue]
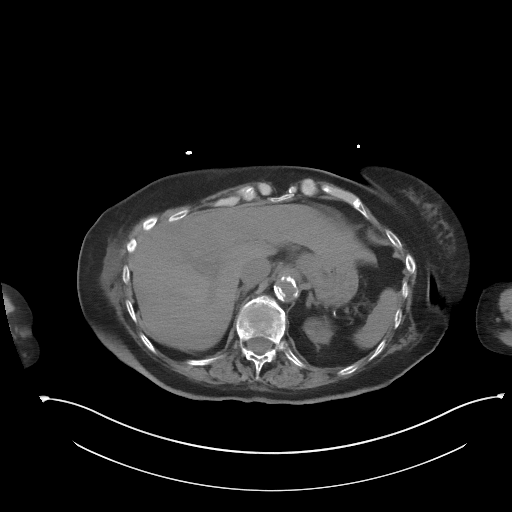
[im 72/82  soft-tissue]
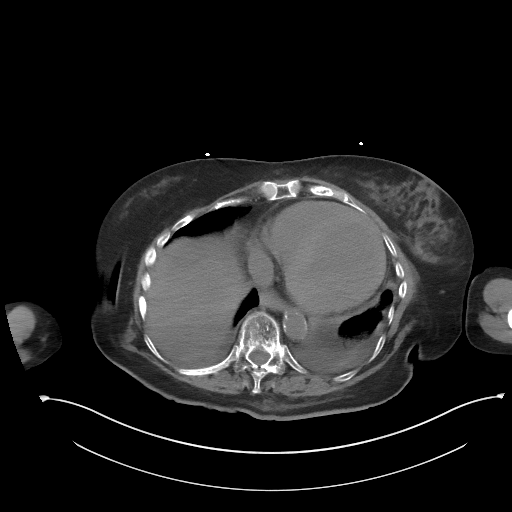
[im 78/82  soft-tissue]
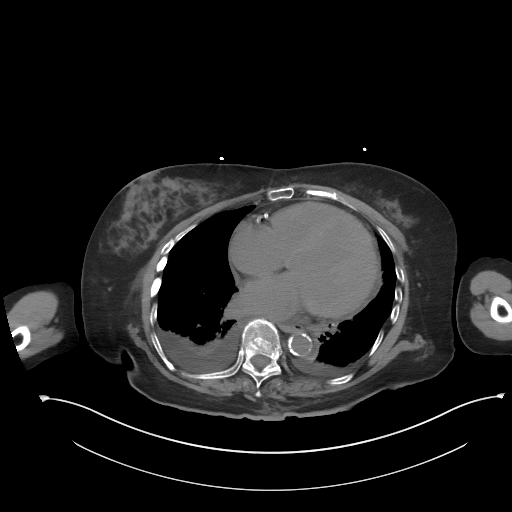

[Series 5: coronal st · coronal · 0.80mm/px · 3 of 93 slices shown]
[im 31/93  soft-tissue]
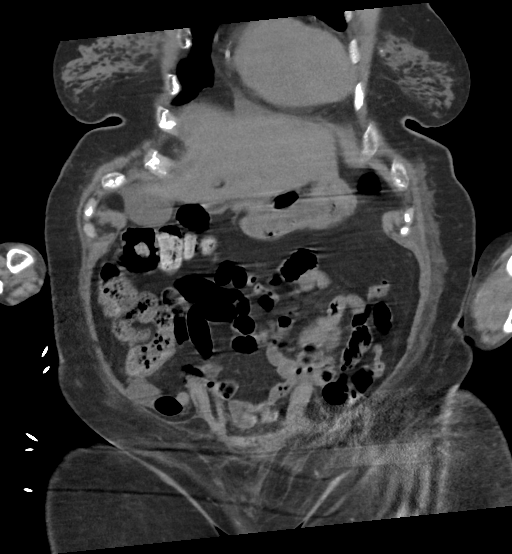
[im 41/93  soft-tissue]
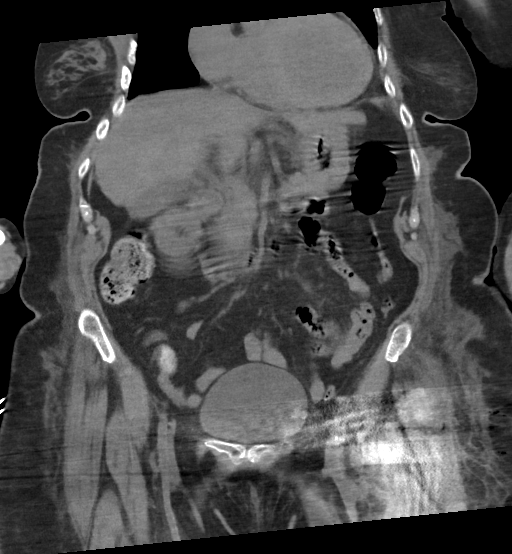
[im 52/93  soft-tissue]
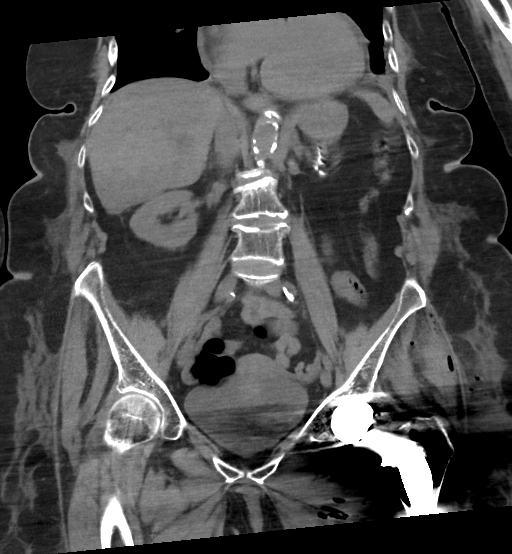

[15 of 46 positions shown; findings below may reference images not displayed]

FINDINGS: Lower chest: There is mild to moderate severity cardiomegaly with
dilatation of the left ventricle.

Mild atelectatic changes are seen within the bilateral lung bases.

Small bilateral pleural effusions are noted.

Hepatobiliary: No focal liver abnormality is seen. The gallbladder
is moderately distended without evidence of gallstones, gallbladder
wall thickening, or biliary dilatation.

Pancreas: Unremarkable. No pancreatic ductal dilatation or
surrounding inflammatory changes.

Spleen: Normal in size without focal abnormality.

Adrenals/Urinary Tract: Adrenal glands are unremarkable. The left
kidney is atrophic in appearance. Mild compensatory hypertrophy of
the right kidney is seen. There is no evidence of renal calculi,
focal lesion, or hydronephrosis. A tiny focus of air is seen within
the urinary bladder lumen, likely secondary to recent Foley
catheterization.

Stomach/Bowel: There is a small hiatal hernia. The appendix is not
clearly identified. No evidence of bowel wall thickening,
distention, or inflammatory changes.

Vascular/Lymphatic: Aortic atherosclerosis. No enlarged abdominal or
pelvic lymph nodes.

Reproductive: Uterus and bilateral adnexa are unremarkable.

Other: No abdominal wall hernia or abnormality. No abdominopelvic
ascites.

Musculoskeletal: A moderate amount of intramuscular and subcutaneous
soft tissue air is seen along the lateral aspect of the left hip,
medial aspect of the proximal left femur and posterolateral aspect
of the left pelvic wall.

A recently placed total left hip replacement is seen with associated
streak artifact and subsequently limited evaluation of the adjacent
osseous and soft tissue structures.

Compression fracture deformities are seen involving the T10, L1 and
L2 vertebral bodies. Prior vertebroplasty is noted at T10.
Additional fracture deformities of indeterminate age are noted along
the superior endplates of the L3 and L4 vertebral bodies.
IMPRESSION: 1. Findings consistent with recent total left hip replacement and
associated postoperative changes within the adjacent soft tissues.
2. Multiple fracture deformities within the thoracic and lumbar
spine, as described above. Correlation with MRI is recommended.
3. Small bilateral pleural effusions.
4. Mild to moderate severity cardiomegaly with dilatation of the
left ventricle.
5. Small hiatal hernia.
6. Atrophic left kidney with compensatory hypertrophy of the right
kidney.
7. Aortic atherosclerosis.

Aortic Atherosclerosis ([id number]-[id number].E).

## 2024-04-01 ENCOUNTER — Ambulatory Visit: Attending: Cardiology

## 2024-04-01 DIAGNOSIS — I255 Ischemic cardiomyopathy: Secondary | ICD-10-CM

## 2024-04-01 LAB — CUP PACEART REMOTE DEVICE CHECK
Battery Remaining Longevity: 66 mo
Battery Remaining Percentage: 71 %
Battery Voltage: 2.99 V
Brady Statistic AP VP Percent: 7.6 %
Brady Statistic AP VS Percent: 1 %
Brady Statistic AS VP Percent: 92 %
Brady Statistic AS VS Percent: 1 %
Brady Statistic RA Percent Paced: 7.3 %
Date Time Interrogation Session: 20260129020011
Implantable Lead Connection Status: 753985
Implantable Lead Connection Status: 753985
Implantable Lead Connection Status: 753985
Implantable Lead Implant Date: 20231101
Implantable Lead Implant Date: 20231101
Implantable Lead Implant Date: 20231101
Implantable Lead Location: 753858
Implantable Lead Location: 753859
Implantable Lead Location: 753860
Implantable Pulse Generator Implant Date: 20231101
Lead Channel Impedance Value: 1025 Ohm
Lead Channel Impedance Value: 310 Ohm
Lead Channel Impedance Value: 440 Ohm
Lead Channel Pacing Threshold Amplitude: 0.5 V
Lead Channel Pacing Threshold Amplitude: 0.5 V
Lead Channel Pacing Threshold Amplitude: 1.25 V
Lead Channel Pacing Threshold Pulse Width: 0.5 ms
Lead Channel Pacing Threshold Pulse Width: 0.5 ms
Lead Channel Pacing Threshold Pulse Width: 0.5 ms
Lead Channel Sensing Intrinsic Amplitude: 4.5 mV
Lead Channel Sensing Intrinsic Amplitude: 9.3 mV
Lead Channel Setting Pacing Amplitude: 1.5 V
Lead Channel Setting Pacing Amplitude: 2 V
Lead Channel Setting Pacing Amplitude: 2.25 V
Lead Channel Setting Pacing Pulse Width: 0.5 ms
Lead Channel Setting Pacing Pulse Width: 0.5 ms
Lead Channel Setting Sensing Sensitivity: 2 mV
Pulse Gen Model: 3562
Pulse Gen Serial Number: 8124818

## 2024-04-08 NOTE — Progress Notes (Signed)
 Remote PPM Transmission

## 2024-04-27 ENCOUNTER — Ambulatory Visit: Admitting: Cardiovascular Disease

## 2024-07-01 ENCOUNTER — Ambulatory Visit: Payer: Self-pay

## 2024-09-30 ENCOUNTER — Ambulatory Visit: Payer: Self-pay

## 2024-12-30 ENCOUNTER — Ambulatory Visit: Payer: Self-pay

## 2025-03-31 ENCOUNTER — Ambulatory Visit: Payer: Self-pay

## 2025-06-30 ENCOUNTER — Ambulatory Visit: Payer: Self-pay
